# Patient Record
Sex: Male | Born: 1940
Health system: Southern US, Community
[De-identification: ages and names within clinical notes are randomized; demographics above are authoritative.]

## PROBLEM LIST (undated history)

## (undated) DIAGNOSIS — E88819 Insulin resistance, unspecified: Secondary | ICD-10-CM

## (undated) DIAGNOSIS — I2699 Other pulmonary embolism without acute cor pulmonale: Secondary | ICD-10-CM

## (undated) DIAGNOSIS — I519 Heart disease, unspecified: Secondary | ICD-10-CM

## (undated) DIAGNOSIS — M958 Other specified acquired deformities of musculoskeletal system: Secondary | ICD-10-CM

## (undated) DIAGNOSIS — M1990 Unspecified osteoarthritis, unspecified site: Secondary | ICD-10-CM

## (undated) DIAGNOSIS — E8881 Metabolic syndrome: Secondary | ICD-10-CM

## (undated) DIAGNOSIS — I240 Acute coronary thrombosis not resulting in myocardial infarction: Secondary | ICD-10-CM

## (undated) DIAGNOSIS — Z974 Presence of external hearing-aid: Secondary | ICD-10-CM

## (undated) HISTORY — DX: Unspecified osteoarthritis, unspecified site: M19.90

## (undated) HISTORY — DX: Other pulmonary embolism without acute cor pulmonale: I26.99

## (undated) HISTORY — DX: Heart disease, unspecified: I51.9

## (undated) HISTORY — DX: Insulin resistance, unspecified: E88.819

## (undated) HISTORY — DX: Metabolic syndrome: E88.81

## (undated) HISTORY — DX: Other specified acquired deformities of musculoskeletal system: M95.8

## (undated) HISTORY — DX: Acute coronary thrombosis not resulting in myocardial infarction: I24.0

## (undated) HISTORY — PX: OTHER SURGICAL HISTORY: SHX169

## (undated) HISTORY — PX: CARDIAC CATHETERIZATION: SHX172

## (undated) HISTORY — PX: TONSILLECTOMY: SUR1361

## (undated) HISTORY — DX: Presence of external hearing-aid: Z97.4

## (undated) HISTORY — PX: APPENDECTOMY: SHX54

---

## 2001-09-29 ENCOUNTER — Encounter: Admission: RE | Admit: 2001-09-29 | Discharge: 2001-11-14 | Payer: Self-pay | Admitting: Internal Medicine

## 2001-10-10 ENCOUNTER — Ambulatory Visit (HOSPITAL_COMMUNITY): Admission: RE | Admit: 2001-10-10 | Discharge: 2001-10-10 | Payer: Self-pay | Admitting: Internal Medicine

## 2001-10-10 ENCOUNTER — Encounter: Payer: Self-pay | Admitting: Internal Medicine

## 2002-11-15 ENCOUNTER — Encounter: Admission: RE | Admit: 2002-11-15 | Discharge: 2003-02-13 | Payer: Self-pay | Admitting: Internal Medicine

## 2005-03-30 ENCOUNTER — Ambulatory Visit: Payer: Self-pay | Admitting: Internal Medicine

## 2006-04-20 ENCOUNTER — Ambulatory Visit: Payer: Self-pay | Admitting: Family Medicine

## 2006-04-21 ENCOUNTER — Encounter: Payer: Self-pay | Admitting: Internal Medicine

## 2006-04-28 ENCOUNTER — Ambulatory Visit: Payer: Self-pay | Admitting: Family Medicine

## 2006-11-09 ENCOUNTER — Ambulatory Visit: Payer: Self-pay | Admitting: Family Medicine

## 2006-11-09 DIAGNOSIS — E039 Hypothyroidism, unspecified: Secondary | ICD-10-CM | POA: Insufficient documentation

## 2006-11-23 ENCOUNTER — Encounter: Payer: Self-pay | Admitting: Family Medicine

## 2006-11-24 LAB — CONVERTED CEMR LAB
Direct LDL: 168 mg/dL — ABNORMAL HIGH
Glucose, Bld: 80 mg/dL (ref 70–99)
TSH: 1.089 microintl units/mL (ref 0.350–5.50)

## 2006-11-25 ENCOUNTER — Telehealth (INDEPENDENT_AMBULATORY_CARE_PROVIDER_SITE_OTHER): Payer: Self-pay | Admitting: *Deleted

## 2006-12-06 ENCOUNTER — Telehealth (INDEPENDENT_AMBULATORY_CARE_PROVIDER_SITE_OTHER): Payer: Self-pay | Admitting: *Deleted

## 2006-12-20 ENCOUNTER — Ambulatory Visit: Payer: Self-pay | Admitting: Family Medicine

## 2006-12-20 DIAGNOSIS — E7800 Pure hypercholesterolemia, unspecified: Secondary | ICD-10-CM | POA: Insufficient documentation

## 2007-01-24 ENCOUNTER — Encounter: Payer: Self-pay | Admitting: Family Medicine

## 2007-03-14 ENCOUNTER — Ambulatory Visit: Payer: Self-pay | Admitting: Family Medicine

## 2007-03-14 DIAGNOSIS — R030 Elevated blood-pressure reading, without diagnosis of hypertension: Secondary | ICD-10-CM | POA: Insufficient documentation

## 2007-04-07 ENCOUNTER — Encounter: Payer: Self-pay | Admitting: Family Medicine

## 2007-04-08 ENCOUNTER — Encounter: Payer: Self-pay | Admitting: Family Medicine

## 2007-04-08 LAB — CONVERTED CEMR LAB
ALT: 20 units/L (ref 0–53)
AST: 23 units/L (ref 0–37)
Albumin: 4 g/dL (ref 3.5–5.2)
Alkaline Phosphatase: 54 units/L (ref 39–117)
Bilirubin, Direct: 0.1 mg/dL (ref 0.0–0.3)
Cholesterol: 138 mg/dL (ref 0–200)
HDL: 44 mg/dL (ref >39)
Indirect Bilirubin: 0.4 mg/dL (ref 0.0–0.9)
LDL Cholesterol: 81 mg/dL (ref 0–99)
Total Bilirubin: 0.5 mg/dL (ref 0.3–1.2)
Total CHOL/HDL Ratio: 3.1
Total Protein: 6.2 g/dL (ref 6.0–8.3)
Triglycerides: 67 mg/dL (ref <150)
VLDL: 13 mg/dL (ref 0–40)

## 2007-06-02 ENCOUNTER — Encounter: Payer: Self-pay | Admitting: Family Medicine

## 2007-07-13 ENCOUNTER — Encounter: Payer: Self-pay | Admitting: Family Medicine

## 2007-07-14 ENCOUNTER — Encounter: Payer: Self-pay | Admitting: Family Medicine

## 2007-07-14 LAB — CONVERTED CEMR LAB
ALT: 20 units/L (ref 0–53)
AST: 22 units/L (ref 0–37)
Albumin: 4.4 g/dL (ref 3.5–5.2)
Alkaline Phosphatase: 56 units/L (ref 39–117)
Bilirubin, Direct: 0.1 mg/dL (ref 0.0–0.3)
Cholesterol, target level: 200 mg/dL
Cholesterol: 167 mg/dL (ref 0–200)
HDL goal, serum: 40 mg/dL
HDL: 43 mg/dL (ref >39)
Indirect Bilirubin: 0.5 mg/dL (ref 0.0–0.9)
LDL Cholesterol: 99 mg/dL (ref 0–99)
LDL Goal: 160 mg/dL
Total Bilirubin: 0.6 mg/dL (ref 0.3–1.2)
Total CHOL/HDL Ratio: 3.9
Total Protein: 6.6 g/dL (ref 6.0–8.3)
Triglycerides: 124 mg/dL (ref <150)
VLDL: 25 mg/dL (ref 0–40)

## 2007-09-29 ENCOUNTER — Ambulatory Visit: Payer: Self-pay | Admitting: Family Medicine

## 2007-09-29 DIAGNOSIS — R109 Unspecified abdominal pain: Secondary | ICD-10-CM | POA: Insufficient documentation

## 2007-09-30 ENCOUNTER — Telehealth: Payer: Self-pay | Admitting: Family Medicine

## 2007-12-01 ENCOUNTER — Ambulatory Visit: Payer: Self-pay | Admitting: Family Medicine

## 2007-12-01 ENCOUNTER — Encounter: Payer: Self-pay | Admitting: Family Medicine

## 2007-12-01 LAB — CONVERTED CEMR LAB
ALT: 23 units/L (ref 0–53)
AST: 21 units/L (ref 0–37)
Albumin: 4.6 g/dL (ref 3.5–5.2)
Alkaline Phosphatase: 52 units/L (ref 39–117)
BUN: 16 mg/dL (ref 6–23)
CO2: 23 meq/L (ref 19–32)
Calcium: 9.4 mg/dL (ref 8.4–10.5)
Chloride: 105 meq/L (ref 96–112)
Creatinine, Ser: 0.96 mg/dL (ref 0.40–1.50)
Glucose, Bld: 117 mg/dL — ABNORMAL HIGH (ref 70–99)
HCT: 42.5 % (ref 39.0–52.0)
Hemoglobin: 14.2 g/dL (ref 13.0–17.0)
MCHC: 33.4 g/dL (ref 30.0–36.0)
MCV: 89.5 fL (ref 78.0–100.0)
Platelets: 248 10*3/uL (ref 150–400)
Potassium: 4.6 meq/L (ref 3.5–5.3)
RBC: 4.75 M/uL (ref 4.22–5.81)
RDW: 13.7 % (ref 11.5–15.5)
Sodium: 140 meq/L (ref 135–145)
TSH: 0.732 microintl units/mL (ref 0.350–5.50)
Total Bilirubin: 0.7 mg/dL (ref 0.3–1.2)
Total Protein: 7 g/dL (ref 6.0–8.3)
WBC: 6 10*3/uL (ref 4.0–10.5)

## 2007-12-02 ENCOUNTER — Telehealth (INDEPENDENT_AMBULATORY_CARE_PROVIDER_SITE_OTHER): Payer: Self-pay | Admitting: *Deleted

## 2007-12-27 ENCOUNTER — Encounter: Payer: Self-pay | Admitting: Family Medicine

## 2008-06-15 ENCOUNTER — Encounter: Payer: Self-pay | Admitting: Family Medicine

## 2008-07-20 ENCOUNTER — Encounter: Payer: Self-pay | Admitting: Family Medicine

## 2008-09-17 ENCOUNTER — Ambulatory Visit: Payer: Self-pay | Admitting: Family Medicine

## 2008-10-12 ENCOUNTER — Encounter: Payer: Self-pay | Admitting: Family Medicine

## 2008-10-15 LAB — CONVERTED CEMR LAB
ALT: 20 units/L (ref 0–53)
AST: 22 units/L (ref 0–37)
Albumin: 4.3 g/dL (ref 3.5–5.2)
Alkaline Phosphatase: 46 units/L (ref 39–117)
BUN: 24 mg/dL — ABNORMAL HIGH (ref 6–23)
CO2: 22 meq/L (ref 19–32)
Calcium: 8.8 mg/dL (ref 8.4–10.5)
Chloride: 107 meq/L (ref 96–112)
Cholesterol: 164 mg/dL (ref 0–200)
Creatinine, Ser: 0.83 mg/dL (ref 0.40–1.50)
Glucose, Bld: 117 mg/dL — ABNORMAL HIGH (ref 70–99)
HDL: 41 mg/dL (ref >39)
LDL Cholesterol: 99 mg/dL (ref 0–99)
PSA: 0.66 ng/mL (ref 0.10–4.00)
Potassium: 4.5 meq/L (ref 3.5–5.3)
Sodium: 141 meq/L (ref 135–145)
TSH: 1.871 microintl units/mL (ref 0.350–4.50)
Total Bilirubin: 0.7 mg/dL (ref 0.3–1.2)
Total CHOL/HDL Ratio: 4
Total Protein: 6.4 g/dL (ref 6.0–8.3)
Triglycerides: 120 mg/dL (ref <150)
VLDL: 24 mg/dL (ref 0–40)

## 2009-01-14 ENCOUNTER — Telehealth (INDEPENDENT_AMBULATORY_CARE_PROVIDER_SITE_OTHER): Payer: Self-pay | Admitting: *Deleted

## 2009-01-24 ENCOUNTER — Encounter: Payer: Self-pay | Admitting: Family Medicine

## 2009-06-20 ENCOUNTER — Ambulatory Visit: Payer: Self-pay | Admitting: Family Medicine

## 2009-07-02 ENCOUNTER — Encounter: Payer: Self-pay | Admitting: Family Medicine

## 2009-10-24 ENCOUNTER — Ambulatory Visit: Payer: Self-pay | Admitting: Family Medicine

## 2009-11-01 ENCOUNTER — Encounter: Payer: Self-pay | Admitting: Family Medicine

## 2009-11-04 ENCOUNTER — Telehealth: Payer: Self-pay | Admitting: Family Medicine

## 2009-11-04 LAB — CONVERTED CEMR LAB
ALT: 20 units/L (ref 0–53)
AST: 19 units/L (ref 0–37)
Albumin: 4.2 g/dL (ref 3.5–5.2)
Alkaline Phosphatase: 52 units/L (ref 39–117)
BUN: 18 mg/dL (ref 6–23)
CO2: 19 meq/L (ref 19–32)
Calcium: 8.9 mg/dL (ref 8.4–10.5)
Chloride: 109 meq/L (ref 96–112)
Cholesterol: 154 mg/dL (ref 0–200)
Creatinine, Ser: 0.87 mg/dL (ref 0.40–1.50)
Glucose, Bld: 108 mg/dL — ABNORMAL HIGH (ref 70–99)
HDL: 37 mg/dL — ABNORMAL LOW (ref >39)
LDL Cholesterol: 90 mg/dL (ref 0–99)
Potassium: 4.1 meq/L (ref 3.5–5.3)
Sodium: 142 meq/L (ref 135–145)
TSH: 1.836 microintl units/mL (ref 0.350–4.500)
Total Bilirubin: 0.7 mg/dL (ref 0.3–1.2)
Total CHOL/HDL Ratio: 4.2
Total Protein: 6.5 g/dL (ref 6.0–8.3)
Triglycerides: 135 mg/dL (ref <150)
VLDL: 27 mg/dL (ref 0–40)

## 2009-11-06 ENCOUNTER — Encounter: Payer: Self-pay | Admitting: Family Medicine

## 2009-11-08 ENCOUNTER — Encounter: Payer: Self-pay | Admitting: Family Medicine

## 2010-01-21 ENCOUNTER — Encounter: Payer: Self-pay | Admitting: Family Medicine

## 2010-07-23 ENCOUNTER — Ambulatory Visit: Payer: Self-pay | Admitting: Family Medicine

## 2010-07-23 DIAGNOSIS — R000 Tachycardia, unspecified: Secondary | ICD-10-CM | POA: Insufficient documentation

## 2010-07-25 LAB — CONVERTED CEMR LAB
ALT: 19 units/L (ref 0–53)
AST: 21 units/L (ref 0–37)
Albumin: 4.6 g/dL (ref 3.5–5.2)
Alkaline Phosphatase: 51 units/L (ref 39–117)
BUN: 17 mg/dL (ref 6–23)
CO2: 25 meq/L (ref 19–32)
Calcium: 9.1 mg/dL (ref 8.4–10.5)
Chloride: 105 meq/L (ref 96–112)
Cholesterol: 162 mg/dL (ref 0–200)
Creatinine, Ser: 0.88 mg/dL (ref 0.40–1.50)
Glucose, Bld: 119 mg/dL — ABNORMAL HIGH (ref 70–99)
HDL: 41 mg/dL (ref >39)
LDL Cholesterol: 92 mg/dL (ref 0–99)
PSA: 0.58 ng/mL (ref 0.10–4.00)
Potassium: 4.5 meq/L (ref 3.5–5.3)
Sodium: 139 meq/L (ref 135–145)
TSH: 1.437 microintl units/mL (ref 0.350–4.500)
Total Bilirubin: 0.6 mg/dL (ref 0.3–1.2)
Total CHOL/HDL Ratio: 4
Total Protein: 6.5 g/dL (ref 6.0–8.3)
Triglycerides: 144 mg/dL (ref <150)
VLDL: 29 mg/dL (ref 0–40)

## 2010-10-02 ENCOUNTER — Ambulatory Visit: Payer: Self-pay | Admitting: Family Medicine

## 2010-12-21 HISTORY — PX: ACNE CYST REMOVAL: SUR1112

## 2011-01-20 NOTE — Assessment & Plan Note (Signed)
Summary: Seb cyst   Vital Signs:  Patient profile:   70 year old male Height:      70 inches Weight:      210 pounds Pulse rate:   80 / minute BP sitting:   145 / 85  (right arm) Cuff size:   regular  Vitals Entered By: Avon Gully CMA, Duncan Dull) (October 02, 2010 1:16 PM) CC: knot on rt shoulder,wife notice last week   Primary Care Provider:  Nani Gasser MD  CC:  knot on rt shoulder and wife notice last week.  History of Present Illness: knot on rt shoulder,wife noticed it last week. NOt bothering him, nontender.  No drianage.   Current Medications (verified): 1)  Synthroid 75 Mcg Tabs (Levothyroxine Sodium) .... Take One By Mouth Daily 2)  Meloxicam 15 Mg Tabs (Meloxicam) .... Take 1 Tablet By Mouth Once A Day As Needed Pain. 3)  Multivitamins  Tabs (Multiple Vitamin) .... Take 1 Tablet By Mouth Once A Day 4)  Prilosec Otc 20 Mg Tbec (Omeprazole Magnesium) .... Take 1 Tablet By Mouth Once A Day 5)  Zocor 40 Mg Tabs (Simvastatin) .... Take 1 Tablet By Mouth Once A Day 6)  Toprol Xl 100 Mg Tb24 (Metoprolol Succinate) .... Take One Tablet By Mouth Once A Day  Allergies (verified): No Known Drug Allergies  Comments:  Nurse/Medical Assistant: The patient's medications and allergies were reviewed with the patient and were updated in the Medication and Allergy Lists. Avon Gully CMA, Duncan Dull) (October 02, 2010 1:17 PM)  Physical Exam  General:  Well-developed,well-nourished,in no acute distress; alert,appropriate and cooperative throughout examination Skin:  lRight uppper back over scapula has a appor 1 cm sebaceious cyst. doesn't appear inflammed.    Impression & Recommendations:  Problem # 1:  SEBACEOUS CYST (ICD-706.2) Gave reassurance. Deicused techniques for removal if he would like it removed but recommend just keep an eye on it.    Complete Medication List: 1)  Synthroid 75 Mcg Tabs (Levothyroxine sodium) .... Take one by mouth daily 2)  Meloxicam 15  Mg Tabs (Meloxicam) .... Take 1 tablet by mouth once a day as needed pain. 3)  Multivitamins Tabs (Multiple vitamin) .... Take 1 tablet by mouth once a day 4)  Prilosec Otc 20 Mg Tbec (Omeprazole magnesium) .... Take 1 tablet by mouth once a day 5)  Zocor 40 Mg Tabs (Simvastatin) .... Take 1 tablet by mouth once a day 6)  Toprol Xl 100 Mg Tb24 (Metoprolol succinate) .... Take one tablet by mouth once a day 7)  Hydrocortisone-acetic Acid 1-2 % Soln (Hydrocortisone-acetic acid) .... 3 gtt in each ear eery 8 hours for 5-7 days.  Patient Instructions: 1)  Sebaceous cyst on your back. Keep an eye on it to see if getting larger.  Prescriptions: HYDROCORTISONE-ACETIC ACID 1-2 % SOLN (HYDROCORTISONE-ACETIC ACID) 3 gtt in each ear eery 8 hours for 5-7 days.  #1 bottle. x 1   Entered and Authorized by:   Nani Gasser MD   Signed by:   Nani Gasser MD on 10/02/2010   Method used:   [facility]ically to        [facility] [phone][facility] (retail)       [address] [place], [place]  [address]       Ph: [phone] or [phone]       Fax: [phone]   RxID:   [id number]

## 2011-01-20 NOTE — Assessment & Plan Note (Signed)
Summary: F/u  chol, thyroid, etc   Vital Signs:  Patient profile:   70 year old male Height:      70 inches Weight:      209 pounds BMI:     30.10 Pulse rate:   65 / minute BP sitting:   128 / 75  (left arm) Cuff size:   regular  Vitals Entered By: Avon Gully CMA, Duncan Dull) (July 23, 2010 8:39 AM) CC: F/u meds   Primary Care Provider:  Nani Gasser MD  CC:  F/u meds.  History of Present Illness: Has been having occ rapid HR. Not sure if anxiety realted. Trying to work on his stress levels. Had a cath in 2007. [facility]   Has been trying to wean down his meloxicm becuase awnsto to get off of it.Tolerating it well. Still having alot of back pain. Holding off on seeing ortho for now because afraid they may say he needs surgery.    Denies any thyroid symptoms. No skin or hair changes.   Current Medications (verified): 1)  Synthroid 75 Mcg Tabs (Levothyroxine Sodium) .... Take One By Mouth Daily 2)  Meloxicam 15 Mg Tabs (Meloxicam) .... Take 1 Tablet By Mouth Once A Day As Needed Pain. 3)  Multivitamins  Tabs (Multiple Vitamin) .... Take 1 Tablet By Mouth Once A Day 4)  Prilosec Otc 20 Mg Tbec (Omeprazole Magnesium) .... Take 1 Tablet By Mouth Once A Day 5)  Zocor 40 Mg Tabs (Simvastatin) .... Take 1 Tablet By Mouth Once A Day 6)  Toprol Xl 100 Mg Tb24 (Metoprolol Succinate) .... Take One Tablet By Mouth Once A Day  Allergies (verified): No Known Drug Allergies  Comments:  Nurse/Medical Assistant: The patient's medications and allergies were reviewed with the patient and were updated in the Medication and Allergy Lists. 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 Lungs:  Normal respiratory effort, chest expands symmetrically. Lungs are clear to auscultation, no crackles or wheezes. Heart:  Normal rate and regular rhythm. S1 and S2 normal without gallop, murmur, click, rub or other extra  No carotid bruits.  Skin:  no rashes.   Cervical Nodes:  No lymphadenopathy noted Psych:  Cognition and judgment appear intact. Alert and cooperative with normal attention span and concentration. No apparent delusions, illusions, hallucinations   Impression & Recommendations:  Problem # 1:  HYPERCHOLESTEROLEMIA (ICD-272.0) Doing well. No SE.  will recheck today.   His updated medication list for this problem includes:    Zocor 40 Mg Tabs (Simvastatin) .[name]... Take 1 tablet by mouth once a day  Orders: T-Comprehensive Metabolic Panel ([id number]) T-Lipid Profile ([id number])  Problem # 2:  HYPOTHYROIDISM NOS (ICD-244.9) Will recheck the level.  His updated medication list for this problem includes:    Synthroid 75 Mcg Tabs (Levothyroxine sodium) .[name]... Take one by mouth daily  Orders: T-TSH ([id number])  Problem # 3:  TACHYCARDIA (ICD-785.0) Do  rec he f/u with his cardiologist for a cardiac monitor. He says he will do this.    Complete Medication List: 1)  Synthroid 75 Mcg Tabs (Levothyroxine sodium) .... Take one by mouth daily 2)  Meloxicam 15 Mg Tabs (Meloxicam) .... Take 1 tablet by mouth once a day as needed pain. 3)  Multivitamins Tabs (Multiple vitamin) .... Take 1 tablet by mouth once a day 4)  Prilosec Otc 20 Mg Tbec (Omeprazole magnesium) .... Take 1 tablet  by mouth once a day 5)  Zocor 40 Mg Tabs (Simvastatin) .... Take 1 tablet by mouth once a day 6)  Toprol Xl 100 Mg Tb24 (Metoprolol succinate) .... Take one tablet by mouth once a day  Other Orders: T-PSA Total (Medicare Screen Only) ([id number])  Patient Instructions: 1)  We will call you with your lab results.  2)  Please schedule a follow-up appointment in 6 months .

## 2011-01-20 NOTE — Medication Information (Signed)
Summary: Possible Nonadherence with Simvastatin/[facility]  Possible Nonadherence with Simvastatin/[facility]   Imported By: Lanelle Bal 01/31/2010 12:04:13  _____________________________________________________________________  External Attachment:    Type:   Image     Comment:   External Document

## 2011-01-28 ENCOUNTER — Telehealth: Payer: Self-pay | Admitting: Family Medicine

## 2011-01-28 ENCOUNTER — Telehealth (INDEPENDENT_AMBULATORY_CARE_PROVIDER_SITE_OTHER): Payer: Self-pay | Admitting: *Deleted

## 2011-01-30 ENCOUNTER — Ambulatory Visit (INDEPENDENT_AMBULATORY_CARE_PROVIDER_SITE_OTHER): Payer: Medicare Other | Admitting: Family Medicine

## 2011-01-30 ENCOUNTER — Encounter: Payer: Self-pay | Admitting: Family Medicine

## 2011-01-30 DIAGNOSIS — R1012 Left upper quadrant pain: Secondary | ICD-10-CM | POA: Insufficient documentation

## 2011-01-30 DIAGNOSIS — R030 Elevated blood-pressure reading, without diagnosis of hypertension: Secondary | ICD-10-CM

## 2011-02-05 ENCOUNTER — Ambulatory Visit
Admission: RE | Admit: 2011-02-05 | Discharge: 2011-02-05 | Disposition: A | Discharge status: Home / Self Care | Payer: Medicare Other | Source: Ambulatory Visit | Attending: Family Medicine | Admitting: Family Medicine

## 2011-02-05 ENCOUNTER — Other Ambulatory Visit: Payer: Self-pay | Admitting: Family Medicine

## 2011-02-05 DIAGNOSIS — R079 Chest pain, unspecified: Secondary | ICD-10-CM

## 2011-02-05 NOTE — Progress Notes (Signed)
Summary: [facility] refill  Phone Note Call from Patient Call back at Home Phone [phone]   Caller: Patient Reason for Call: Refill Medication Summary of Call: pt has appt on 2/10, needs enough toprol to make it to appt.  Does not want full rx as he may ask for med to be switched. LM to [facility] at home number to clarify how many tabs he needs and what pharmacy. Initial call taken by: Francee Piccolo CMA Duncan Dull),  January 28, 2011 10:02 AM    Prescriptions: TOPROL XL 100 MG TB24 (METOPROLOL SUCCINATE) take one tablet by mouth once a day  #7 x 0   Entered and Authorized by:   Nani Gasser MD   Signed by:   Nani Gasser MD on 01/28/2011   Method used:   Electronically to        [facility] [phone]* (retail)       [address] [place], [place]  [address]       Ph: [phone] or [phone]       Fax: [phone]   RxID:   [id number]

## 2011-02-05 NOTE — Assessment & Plan Note (Signed)
Summary: LUQ noises, BP   Vital Signs:  Patient profile:   70 year old male Height:      70 inches Weight:      217 pounds Pulse rate:   61 / minute BP sitting:   148 / 83  (right arm) Cuff size:   regular  Vitals Entered By: Avon Gully CMA, Duncan Dull) (January 30, 2011 4:35 PM) CC: wants dr. to listen to abd,makes loud noise   Primary Care Provider:  Nani Gasser MD  CC:  wants dr. to listen to abd and makes loud noise.  History of Present Illness: wants dr. to listen to abd,makes loud noise.  Has been there for a cuple of years.  Stomach is grumbling. Not painful.  More pevelant.  No changein bloating or gas. Notices less when he is sitting up straight.  Sometimes radiates towards the chest.  Just notices it when inhales. On prilosec for years since on the meloxicam.  Does burp alot. NO reflux sxs.    Current Medications (verified): 1)  Synthroid 75 Mcg Tabs (Levothyroxine Sodium) .... Take One By Mouth Daily 2)  Meloxicam 15 Mg Tabs (Meloxicam) .... Take 1 Tablet By Mouth Once A Day As Needed Pain. 3)  Multivitamins  Tabs (Multiple Vitamin) .... Take 1 Tablet By Mouth Once A Day 4)  Prilosec Otc 20 Mg Tbec (Omeprazole Magnesium) .... Take 1 Tablet By Mouth Once A Day 5)  Zocor 40 Mg Tabs (Simvastatin) .... Take 1 Tablet By Mouth Once A Day 6)  Toprol Xl 100 Mg Tb24 (Metoprolol Succinate) .... Take One Tablet By Mouth Once A Day  Allergies (verified): No Known Drug Allergies  Comments:  Nurse/Medical Assistant: The patient's medications and allergies were reviewed with the patient and were updated in the Medication and Allergy Lists. Avon Gully CMA, Duncan Dull) (January 30, 2011 4:38 PM)  Physical Exam  General:  Well-developed,well-nourished,in no acute distress; alert,appropriate and cooperative throughout examination Lungs:  Normal respiratory effort, chest expands symmetrically. Lungs are clear to auscultation, no crackles or wheezes. Heart:  Normal rate  and regular rhythm. S1 and S2 normal without gallop, murmur, click, rub or other extra sounds. Abdomen:  Bowel sounds positive,abdomen soft and non-tender without masses, organomegaly or hernias noted. He does have loud BS in the LUQ. Nonender.  Skin:  no rashes.   Psych:  Cognition and judgment appear intact. Alert and cooperative with normal attention span and concentration. No apparent delusions, illusions, hallucinations   Impression & Recommendations:  Problem # 1:  LUQ PAIN (ICD-789.02) Discussed likely hiatal hernia. He is not actually having pain but wants reassurance. We will get a CXR to rule out any problems with the lungs and sometimes if has a large hiatal hernia can see it on CXR.  If gets worse or becomes painful the please call the office. Or fi starts have GERD sxs.  Orders: T-Chest x-ray, 2 views (71020)  Problem # 2:  ABNORMAL FINDINGS, ELEVATED BP W/O HTN (ICD-796.2) this is up for him. Was up at lat visit as well. Says getting 116/70s at home on average thought this AM in the 130s. Says did eat coldcutsa for lunch the day before. F/U for recheck in on e week and bring in home monitor. Denies any use of cold meds.  His updated medication list for this problem includes:    Metoprolol Succinate 100 Mg Xr24h-tab (Metoprolol succinate) .[name]... Take 1 tablet by mouth once a day  Complete Medication List: 1)  Synthroid 75 Mcg  Tabs (Levothyroxine sodium) .... Take one by mouth daily 2)  Meloxicam 15 Mg Tabs (Meloxicam) .... Take 1 tablet by mouth once a day as needed pain. 3)  Multivitamins Tabs (Multiple vitamin) .... Take 1 tablet by mouth once a day 4)  Prilosec Otc 20 Mg Tbec (Omeprazole magnesium) .... Take 1 tablet by mouth once a day 5)  Simvastatin 40 Mg Tabs (Simvastatin) .... Take 1 tablet by mouth once a day at bedtime 6)  Metoprolol Succinate 100 Mg Xr24h-tab (Metoprolol succinate) .... Take 1 tablet by mouth once a day  Patient Instructions: 1)  Follow up in 1-2  weeks to recheck your blood pressure. Bring in your home cuff with you.  Prescriptions: METOPROLOL SUCCINATE 100 MG XR24H-TAB (METOPROLOL SUCCINATE) Take 1 tablet by mouth once a day  #90 x 3   Entered and Authorized by:   Nani Gasser MD   Signed by:   Nani Gasser MD on 01/30/2011   Method used:   Printed then faxed to ...       [facility] (retail)             , [place]         Ph: [phone]       Fax: [phone]   RxID:   [id number]    Orders Added: 1)  T-Chest x-ray, 2 views [71020] 2)  Est. Patient Level III [62952]

## 2011-02-05 NOTE — Progress Notes (Signed)
Summary: Refil of a few  Phone Note Refill Request Message from:  Patient on January 28, 2011 1:46 PM  Patient came in today and states he just needs enough Metroprolol to last him until Friday his appt time because he does not want a whole motnh supply because of how much it is and he plans on switching after his appt on Friday 01/30/11 at 4:30... Please call him when this has been completed Cell [phone].Michaelle Copas  January 28, 2011 1:54 PM   Next Appointment Scheduled: 01/30/11 Initial call taken by: Michaelle Copas,  January 28, 2011 1:54 PM

## 2011-02-06 ENCOUNTER — Ambulatory Visit (INDEPENDENT_AMBULATORY_CARE_PROVIDER_SITE_OTHER): Payer: Medicare Other | Admitting: Family Medicine

## 2011-02-06 ENCOUNTER — Encounter: Payer: Self-pay | Admitting: Family Medicine

## 2011-02-06 DIAGNOSIS — J209 Acute bronchitis, unspecified: Secondary | ICD-10-CM

## 2011-02-11 NOTE — Assessment & Plan Note (Addendum)
Summary: Cough & Congestion    Vital Signs:  Patient profile:   70 year old male Height:      70 inches Weight:      216 pounds Temp:     98.4 degrees F oral Pulse rate:   71 / minute BP sitting:   137 / 73  (right arm) Cuff size:   regular  Vitals Entered By: Avon Gully CMA, Duncan Dull) (February 06, 2011 11:49 AM) CC: congestion and cough x 1 week   Primary Care Provider:  Nani Gasser MD  CC:  congestion and cough x 1 week.  History of Present Illness: Cough and congestion mostly in his throat.  Cough is productive.  Sxs for one week.  No fever.  No sinus congestion or draingage.  Mild ST.  No post nasal drip. No SOB.  Has been using robitussin and that helps. NO SOB  or fever or GI sxs.  Feels the cough and phlegm are moslty in the neck area. The cough is forceful.   Current Medications (verified): 1)  Synthroid 75 Mcg Tabs (Levothyroxine Sodium) .... Take One By Mouth Daily 2)  Meloxicam 15 Mg Tabs (Meloxicam) .... Take 1 Tablet By Mouth Once A Day As Needed Pain. 3)  Multivitamins  Tabs (Multiple Vitamin) .... Take 1 Tablet By Mouth Once A Day 4)  Prilosec Otc 20 Mg Tbec (Omeprazole Magnesium) .... Take 1 Tablet By Mouth Once A Day 5)  Simvastatin 40 Mg Tabs (Simvastatin) .... Take 1 Tablet By Mouth Once A Day At Bedtime 6)  Metoprolol Succinate 100 Mg Xr24h-Tab (Metoprolol Succinate) .... Take 1 Tablet By Mouth Once A Day  Allergies (verified): No Known Drug Allergies  Comments:  Nurse/Medical Assistant: The patient's medications and allergies were reviewed with the patient and were updated in the Medication and Allergy Lists. Avon Gully CMA, Duncan Dull) (February 06, 2011 11:49 AM)  Physical Exam  General:  Well-developed,well-nourished,in no acute distress; alert,appropriate and cooperative throughout examination Head:  Normocephalic and atraumatic without obvious abnormalities. No apparent alopecia or balding. Eyes:  No corneal or conjunctival  inflammation noted. EOMI. Perrla Ears:  External ear exam shows no significant lesions or deformities.  Bilat hearing aids.  Nose:  External nasal examination shows no deformity or inflammation. Nasal mucosa are pink and moist without lesions or exudates. Mouth:  Oral mucosa and oropharynx without lesions or exudates.  Teeth in good repair. Neck:  No deformities, masses, or tenderness noted. Lungs:  Normal respiratory effort, chest expands symmetrically. Lungs are clear to auscultation, no crackles or wheezes. Heart:  Normal rate and regular rhythm. S1 and S2 normal without gallop, murmur, click, rub or other extra sounds. Skin:  no rashes.   Cervical Nodes:  No lymphadenopathy noted   Impression & Recommendations:  Problem # 1:  BRONCHITIS- ACUTE (ICD-466.0) and let him know that eating.  I explained to him I have seen several illnesses with the exact same features.  It is typically lasting between 3 to 4 weeks.  If he is not getting better in the next two weeks or he suddenly spikes a fever he should call the office.  Also if he suddenly gets worse he can let us know.  In the meantime if the Robitussin is helping he certainly uses over-the-counter.  If he feels his cough is getting worse we could certainly use a nighttime cough medication and possibly Tessalon Perles during the day.  He declined any prescription medication at this time.of note he had a normal  chest x-ray last week that showed no sign of bronchitis or pneumonia.  Problem # 2:  TACHYCARDIA (ICD-785.0) pulse I had incorrectly said the extended release version and metoprolol.  He had wanted to change to generic b.i.d. testing at his last visit.  [facility]  Complete Medication List: 1)  Synthroid 75 Mcg Tabs (Levothyroxine sodium) .... Take one by mouth daily 2)  Meloxicam 15 Mg Tabs (Meloxicam) .... Take 1 tablet by mouth once a day as needed pain. 3)  Multivitamins Tabs (Multiple vitamin) .... Take  1 tablet by mouth once a day 4)  Prilosec Otc 20 Mg Tbec (Omeprazole magnesium) .... Take 1 tablet by mouth once a day 5)  Simvastatin 40 Mg Tabs (Simvastatin) .... Take 1 tablet by mouth once a day at bedtime 6)  Metoprolol Tartrate 100 Mg Tabs (Metoprolol tartrate) .... Take 1 tablet by mouth two times a day  Patient Instructions: 1)  Call if cough is not better in 2 weeks or if suddenly develop fever or feel you are getting worse.  Prescriptions: METOPROLOL TARTRATE 100 MG TABS (METOPROLOL TARTRATE) Take 1 tablet by mouth two times a day  #14 x 0   Entered and Authorized by:   Nani Gasser MD   Signed by:   Nani Gasser MD on 02/06/2011   Method used:   [facility] to        [facility] [phone]* (retail)       [address] [place], [place]  [address]       Ph: [phone] or [phone]       Fax: [phone]   RxID:   [id number] METOPROLOL SUCCINATE 100 MG XR24H-TAB (METOPROLOL SUCCINATE) Take 1 tablet by mouth once a day  #7 x 0   Entered and Authorized by:   Nani Gasser MD   Signed by:   Nani Gasser MD on 02/06/2011   Method used:   Electronically to        [facility] [phone]* (retail)       [address] [place], [place]  [address]       Ph: [phone] or [phone]       Fax: [phone]   RxID:   [id number]    Orders Added: 1)  Est. 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

## 2011-02-16 ENCOUNTER — Encounter: Payer: Self-pay | Admitting: Family Medicine

## 2011-02-20 ENCOUNTER — Encounter: Payer: Self-pay | Admitting: Family Medicine

## 2011-05-13 ENCOUNTER — Other Ambulatory Visit: Payer: Self-pay | Admitting: Family Medicine

## 2011-05-13 NOTE — Telephone Encounter (Signed)
OK ot refill for one 90 days supply which will get him to august and then needs a f/u appt.

## 2011-05-13 NOTE — Telephone Encounter (Signed)
[facility] is requesting 90 day supply for patient of the meloxicam and levothyroxine; however, pt last lab 11-04-09 and TSH normal @ 1.836.  Last OV for thyroid is 06-16-10.  Last refills for both these medications were 06-16-10.  Should pt return for labs and office visit??  Please advise. Plan:  Routed to Dr. Marlyne Beards, LPN Domingo Dimes

## 2011-05-19 ENCOUNTER — Telehealth: Payer: Self-pay | Admitting: Family Medicine

## 2011-05-20 ENCOUNTER — Telehealth: Payer: Self-pay | Admitting: Family Medicine

## 2011-05-20 DIAGNOSIS — R52 Pain, unspecified: Secondary | ICD-10-CM

## 2011-05-20 DIAGNOSIS — E039 Hypothyroidism, unspecified: Secondary | ICD-10-CM

## 2011-05-20 MED ORDER — LEVOTHYROXINE SODIUM 75 MCG PO TABS: 75.0000 ug | ORAL_TABLET | Freq: Every day | ORAL | Status: DC

## 2011-05-20 MED ORDER — MELOXICAM 15 MG PO TABS: 15.0000 mg | ORAL_TABLET | Freq: Every day | ORAL | Status: DC | PRN

## 2011-05-20 NOTE — Telephone Encounter (Signed)
Pt informed that  90 day prescriptions for the Meloxicam and levothyroxine were sent to [facility] mail order for him.  Told pt to make sure he follows up in August 2012.  Pt voiced understanding. Jarvis Newcomer, LPN Domingo Dimes

## 2011-05-21 NOTE — Telephone Encounter (Signed)
Don't know why I have this encounter, therefore will make this note then close. Jarvis Newcomer, LPN Domingo Dimes

## 2011-05-21 NOTE — Telephone Encounter (Signed)
error 

## 2011-08-28 ENCOUNTER — Ambulatory Visit (INDEPENDENT_AMBULATORY_CARE_PROVIDER_SITE_OTHER): Payer: Medicare Other | Admitting: Family Medicine

## 2011-08-28 ENCOUNTER — Encounter: Payer: Self-pay | Admitting: Family Medicine

## 2011-08-28 DIAGNOSIS — R52 Pain, unspecified: Secondary | ICD-10-CM

## 2011-08-28 DIAGNOSIS — Z125 Encounter for screening for malignant neoplasm of prostate: Secondary | ICD-10-CM

## 2011-08-28 DIAGNOSIS — Z13220 Encounter for screening for lipoid disorders: Secondary | ICD-10-CM

## 2011-08-28 DIAGNOSIS — E039 Hypothyroidism, unspecified: Secondary | ICD-10-CM

## 2011-08-28 DIAGNOSIS — R000 Tachycardia, unspecified: Secondary | ICD-10-CM

## 2011-08-28 DIAGNOSIS — N400 Enlarged prostate without lower urinary tract symptoms: Secondary | ICD-10-CM

## 2011-08-28 DIAGNOSIS — H9190 Unspecified hearing loss, unspecified ear: Secondary | ICD-10-CM

## 2011-08-28 MED ORDER — LEVOTHYROXINE SODIUM 75 MCG PO TABS: 75.0000 ug | ORAL_TABLET | Freq: Every day | ORAL | Status: DC

## 2011-08-28 MED ORDER — AMBULATORY NON FORMULARY MEDICATION: Status: DC

## 2011-08-28 MED ORDER — MELOXICAM 15 MG PO TABS: 15.0000 mg | ORAL_TABLET | Freq: Every day | ORAL | Status: DC | PRN

## 2011-08-28 NOTE — Progress Notes (Signed)
  Subjective:    Patient ID: [name], male    DOB: Mar 06, 1941, 70 y.o.   MRN: [id number]  HPI  Here to follow up his thyroid and get yearly labs. No complaints. Doing well. No skin or hair changes. No weight changes.   Due for yearly labs.    He would like a referral to a new cardiologist. Prefers [facility]. His current cards has retired.    Would like PSA checked.   Review of Systems     Objective:   Physical Exam  Constitutional: He is oriented to person, place, and time. He appears well-developed and well-nourished.  HENT:  Head: Normocephalic and atraumatic.  Cardiovascular: Normal rate, regular rhythm and normal heart sounds.   Pulmonary/Chest: Effort normal. He has wheezes.  Neurological: He is alert and oriented to person, place, and time.  Skin: Skin is warm and dry.  Psychiatric: He has a normal mood and affect.          Assessment & Plan:  Hypothyroid- doing well. Will check level Due for cholesterol screening Gave a rx for the shingles vaccine He declined the flu vaccine.

## 2011-08-30 ENCOUNTER — Encounter: Payer: Self-pay | Admitting: Family Medicine

## 2011-09-02 LAB — COMPLETE METABOLIC PANEL WITH GFR
AST: 21 U/L (ref 0–37)
Alkaline Phosphatase: 52 U/L (ref 39–117)
BUN: 19 mg/dL (ref 6–23)
Creat: 0.89 mg/dL (ref 0.50–1.35)
GFR, Est Non African American: >60 mL/min (ref >60)
Glucose, Bld: 103 mg/dL — ABNORMAL HIGH (ref 70–99)
Total Bilirubin: 0.7 mg/dL (ref 0.3–1.2)

## 2011-09-02 LAB — LIPID PANEL
Cholesterol: 212 mg/dL — ABNORMAL HIGH (ref 0–200)
HDL: 39 mg/dL — ABNORMAL LOW (ref >39)
Total CHOL/HDL Ratio: 5.4 Ratio
VLDL: 21 mg/dL (ref 0–40)

## 2011-09-02 LAB — TSH: TSH: 1.227 u[IU]/mL (ref 0.350–4.500)

## 2011-09-02 LAB — PSA: PSA: 0.53 ng/mL (ref <=4.00)

## 2011-09-07 ENCOUNTER — Telehealth: Payer: Self-pay | Admitting: *Deleted

## 2011-09-07 NOTE — Telephone Encounter (Signed)
OK will recheck in 6 mo then.

## 2011-09-07 NOTE — Telephone Encounter (Signed)
Pt notified. Takes simvastatin regurlarly. Would like to try diet and exercise before increasing med dose.

## 2011-09-07 NOTE — Telephone Encounter (Signed)
LMOM for pt to return call for results. KJ LPN 

## 2011-09-07 NOTE — Telephone Encounter (Signed)
Message copied by Lanae Crumbly on Mon Sep 07, 2011  8:49 AM ------      Message from: Nani Gasser D      Created: Fri Sep 04, 2011  7:56 AM       Call patient: Thyroid is normal. LDL cholesterol is 152 which is high. Normal is under 100. His HDL is low at 39. If he taking his simvastatin regularly? If not he needs to restart it. If he is on it on not sure why the sudden jump in his cholesterol and we may need to adjust the medication. Blood sugar is still borderline like it has been in the past but does actually look a little bit better this time. Liver, kidney, prostate are all normal.

## 2011-09-07 NOTE — Telephone Encounter (Signed)
Message copied by Lanae Crumbly on Mon Sep 07, 2011  3:02 PM ------      Message from: Nani Gasser D      Created: Fri Sep 04, 2011  7:56 AM       Call patient: Thyroid is normal. LDL cholesterol is 152 which is high. Normal is under 100. His HDL is low at 39. If he taking his simvastatin regularly? If not he needs to restart it. If he is on it on not sure why the sudden jump in his cholesterol and we may need to adjust the medication. Blood sugar is still borderline like it has been in the past but does actually look a little bit better this time. Liver, kidney, prostate are all normal.

## 2011-09-07 NOTE — Telephone Encounter (Signed)
Pt calls back and states he has been taking the Simvastatin regularly but did admit his diet and exercise has not been good due to work schedule. What he would like to do is stay at the current dose of simvastatin and really work on diet and exercise for 6 months and then if rechecks and still up then can increase med if this is ok with you.

## 2011-10-27 ENCOUNTER — Encounter: Payer: Self-pay | Admitting: Family Medicine

## 2011-10-27 ENCOUNTER — Ambulatory Visit (INDEPENDENT_AMBULATORY_CARE_PROVIDER_SITE_OTHER): Payer: Medicare Other | Admitting: Family Medicine

## 2011-10-27 VITALS — BP 121/66 | HR 60 | Temp 97.7°F | Ht 71.0 in | Wt 209.0 lb

## 2011-10-27 DIAGNOSIS — J0100 Acute maxillary sinusitis, unspecified: Secondary | ICD-10-CM

## 2011-10-27 DIAGNOSIS — H60399 Other infective otitis externa, unspecified ear: Secondary | ICD-10-CM

## 2011-10-27 DIAGNOSIS — H6693 Otitis media, unspecified, bilateral: Secondary | ICD-10-CM

## 2011-10-27 DIAGNOSIS — H60509 Unspecified acute noninfective otitis externa, unspecified ear: Secondary | ICD-10-CM

## 2011-10-27 DIAGNOSIS — H6690 Otitis media, unspecified, unspecified ear: Secondary | ICD-10-CM

## 2011-10-27 MED ORDER — FEXOFENADINE-PSEUDOEPHED ER 180-240 MG PO TB24: 1.0000 | ORAL_TABLET | Freq: Every day | ORAL | Status: AC

## 2011-10-27 MED ORDER — NEOMYCIN-POLYMYXIN-HC 3.5-10000-1 OT SOLN: 3.0000 [drp] | Freq: Three times a day (TID) | OTIC | Status: AC

## 2011-10-27 MED ORDER — AMOXICILLIN-POT CLAVULANATE 875-125 MG PO TABS: 1.0000 | ORAL_TABLET | Freq: Two times a day (BID) | ORAL | Status: AC

## 2011-10-27 NOTE — Progress Notes (Signed)
  Subjective:    Patient ID: [name], male    DOB: [birth year], 70 y.o.   MRN: [id number]  Ear Drainage  There is pain in both ears. This is a chronic problem. The current episode started more than 1 month ago. The problem occurs constantly. The problem has been gradually worsening. There has been no fever. The pain is mild. Associated symptoms include ear discharge. Pertinent negatives include no headaches or hearing loss. Associated symptoms comments: Odor associated with it. He has tried nothing for the symptoms. His past medical history is significant for a chronic ear infection and hearing loss.      Review of Systems  HENT: Positive for ear discharge. Negative for hearing loss.   Neurological: Negative for headaches.  All other systems reviewed and are negative.      BP 121/66  Pulse 60  Temp(Src) 97.7 F (36.5 C) (Oral)  Ht 5\' 11"  (1.803 m)  Wt 209 lb (94.802 kg)  BMI 29.15 kg/m2  SpO2 98%  Objective:   Physical Exam  Constitutional: He appears well-developed and well-nourished.  HENT:  Head: Normocephalic and atraumatic.  Right Ear: No lacerations. There is drainage, swelling and tenderness. No foreign bodies. Tympanic membrane is injected and bulging. Decreased hearing is noted.  Left Ear: There is drainage and swelling. No foreign bodies. Tympanic membrane is injected and bulging. Decreased hearing is noted.  Nose: Mucosal edema present.  Mouth/Throat: Uvula is midline.       P(ost nasal drainage seen          Assessment & Plan:  Bilateral OM w/possible perforation and drainage Sinusitis Augmentin and cortisporin ear drops and allegra D

## 2011-10-27 NOTE — Patient Instructions (Signed)
Sinusitis Sinuses are air pockets within the bones of your face. The growth of bacteria within a sinus leads to infection. The infection prevents the sinuses from draining. This infection is called sinusitis. SYMPTOMS  There will be different areas of pain depending on which sinuses have become infected.  The maxillary sinuses often produce pain beneath the eyes.   Frontal sinusitis may cause pain in the middle of the forehead and above the eyes.  Other problems (symptoms) include:  Toothaches.   Colored, pus-like (purulent) drainage from the nose.   Swelling, warmth, and tenderness over the sinus areas may be signs of infection.  TREATMENT  Sinusitis is most often determined by an exam.X-rays may be taken. If x-rays have been taken, make sure you obtain your results or find out how you are to obtain them. Your caregiver may give you medications (antibiotics). These are medications that will help kill the bacteria causing the infection. You may also be given a medication (decongestant) that helps to reduce sinus swelling.  HOME CARE INSTRUCTIONS   Only take over-the-counter or prescription medicines for pain, discomfort, or fever as directed by your caregiver.   Drink extra fluids. Fluids help thin the mucus so your sinuses can drain more easily.   Applying either moist heat or ice packs to the sinus areas may help relieve discomfort.   Use saline nasal sprays to help moisten your sinuses. The sprays can be found at your local drugstore.  SEEK IMMEDIATE MEDICAL CARE IF:  You have a fever.   You have increasing pain, severe headaches, or toothache.   You have nausea, vomiting, or drowsiness.   You develop unusual swelling around the face or trouble seeing.  MAKE SURE YOU:   Understand these instructions.   Will watch your condition.   Will get help right away if you are not doing well or get worse.  Document Released: 12/07/2005 Document Revised: 08/19/2011 Document Reviewed:  07/06/2007 [facility] Patient Information 2012 [place], [place].Otitis Media You or your child has otitis media. This is an infection of the middle chamber of the ear. This condition is common in young children and often follows upper respiratory infections. Symptoms of otitis media may include earache or ear fullness, hearing loss, or fever. If the eardrum ruptures, a middle ear infection may also cause bloody or pus-like discharge from the ear. Fussiness, irritability, and persistent crying may be the only signs of otitis media in small children. Otitis media can be caused by a bacteria or a virus. Antibiotics may be used to treat bacterial otitis media. But antibiotics are not effective against viral infections. Not every case of bacterial otitis media requires antibiotics and depending on age, severity of infection, and other risk factors, observation may be all that is required. Ear drops or oral medicines may be prescribed to reduce pain, fever, or congestion. Babies with ear infections should not be fed while lying on their backs. This increases the pressure and pain in the ear. Do not put cotton in the ear canal or clean it with cotton swabs. Swimming should be avoided if the eardrum has ruptured or if there is drainage from the ear canal. If your child experiences recurrent infections, your child may need to be referred to an Ear, Nose, and Throat specialist. HOME CARE INSTRUCTIONS   Take any antibiotic as directed by your caregiver. You or your child may feel better in a few days, but take all medicine or the infection may not respond and may become more  difficult to treat.   Only take over-the-counter or prescription medicines for pain, discomfort, or fever as directed by your caregiver. Do not give aspirin to children.  Otitis media can lead to complications including rupture of the eardrum, long-term hearing loss, and more severe infections. Call your caregiver for follow-up care at the end of  treatment. SEEK IMMEDIATE MEDICAL CARE IF:   Your or your child's problems do not improve within 2 to 3 days.   You or your child has an oral temperature above 102 F (38.9 C), not controlled by medicine.   Your baby is older than 3 months with a rectal temperature of 102 F (38.9 C) or higher.   Your baby is 49 months old or younger with a rectal temperature of 100.4 F (38 C) or higher.   Your child develops increased fussiness.   You or your child develops a stiff neck, severe headache, or confusion.   There is swelling around the ear.   There is dizziness, vomiting, unusual sleepiness, seizures, or twitching of facial muscles.   The pain or ear drainage persists beyond 2 days of antibiotic treatment.  Document Released: 01/14/2005 Document Revised: 08/19/2011 Document Reviewed: 04/04/2010 [facility] Patient Information 2012 [place], [place].Otitis Externa Otitis externa ("swimmer's ear") is a germ (bacterial) or fungal infection of the outer ear canal (from the eardrum to the outside of the ear). Swimming in dirty water may cause swimmer's ear. It also may be caused by moisture in the ear from water remaining after swimming or bathing. Often the first signs of infection may be itching in the ear canal. This may progress to ear canal swelling, redness, and pus drainage, which may be signs of infection. HOME CARE INSTRUCTIONS   Apply the antibiotic drops to the ear canal as prescribed by your doctor.   This can be a very painful medical condition. A strong pain reliever may be prescribed.   Only take over-the-counter or prescription medicines for pain, discomfort, or fever as directed by your caregiver.   If your caregiver has given you a follow-up appointment, it is very important to keep that appointment. Not keeping the appointment could result in a chronic or permanent injury, pain, hearing loss and disability. If there is any problem keeping the appointment, you must call back to  this facility for assistance.  PREVENTION   It is important to keep your ear dry. Use the corner of a towel to wick water out of the ear canal after swimming or bathing.   Avoid scratching in your ear. This can damage the ear canal or remove the protective wax lining the canal and make it easier for germs (bacteria) or a fungus to grow.   You may use ear drops made of rubbing alcohol and vinegar after swimming to prevent future "swimmer's ear" infections. Make up a small bottle of equal parts white vinegar and alcohol. Put 3 or 4 drops into each ear after swimming.   Avoid swimming in lakes, polluted water, or poorly chlorinated pools.  SEEK MEDICAL CARE IF:   An oral temperature above 102 F (38.9 C) develops.   Your ear is still painful after 3 days and shows signs of getting worse (redness, swelling, pain, or pus).  MAKE SURE YOU:   Understand these instructions.   Will watch your condition.   Will get help right away if you are not doing well or get worse.  Document Released: 12/07/2005 Document Revised: 08/19/2011 Document Reviewed: 07/13/2008 [facility] Patient Information 2012 [place]. 

## 2011-12-07 ENCOUNTER — Ambulatory Visit (INDEPENDENT_AMBULATORY_CARE_PROVIDER_SITE_OTHER): Payer: Medicare Other | Admitting: Family Medicine

## 2011-12-07 DIAGNOSIS — Z23 Encounter for immunization: Secondary | ICD-10-CM

## 2011-12-07 NOTE — Progress Notes (Signed)
Patient ID: [name], male   DOB: Sep 21, 1941, 70 y.o.   MRN: [id number] Flu shot administered

## 2012-01-05 ENCOUNTER — Telehealth: Payer: Self-pay | Admitting: Family Medicine

## 2012-01-05 NOTE — Telephone Encounter (Signed)
Patient called left a messg voicemail req a call back to discuss med refill but he did not say what med he needed refilled

## 2012-01-06 ENCOUNTER — Other Ambulatory Visit: Payer: Self-pay | Admitting: *Deleted

## 2012-01-06 MED ORDER — SIMVASTATIN 40 MG PO TABS: 40.0000 mg | ORAL_TABLET | Freq: Every day | ORAL | Status: DC

## 2012-01-06 NOTE — Telephone Encounter (Signed)
Let message on vm that we need to know what medication he needed and he can call pharm for refill

## 2012-01-26 DIAGNOSIS — L820 Inflamed seborrheic keratosis: Secondary | ICD-10-CM | POA: Diagnosis not present

## 2012-01-26 DIAGNOSIS — D485 Neoplasm of uncertain behavior of skin: Secondary | ICD-10-CM | POA: Diagnosis not present

## 2012-01-26 DIAGNOSIS — L570 Actinic keratosis: Secondary | ICD-10-CM | POA: Diagnosis not present

## 2012-01-26 DIAGNOSIS — D239 Other benign neoplasm of skin, unspecified: Secondary | ICD-10-CM | POA: Diagnosis not present

## 2012-02-09 DIAGNOSIS — H2510 Age-related nuclear cataract, unspecified eye: Secondary | ICD-10-CM | POA: Diagnosis not present

## 2012-03-10 DIAGNOSIS — B029 Zoster without complications: Secondary | ICD-10-CM | POA: Diagnosis not present

## 2012-03-15 ENCOUNTER — Encounter: Payer: Self-pay | Admitting: Family Medicine

## 2012-03-15 ENCOUNTER — Ambulatory Visit (INDEPENDENT_AMBULATORY_CARE_PROVIDER_SITE_OTHER): Payer: Medicare Other | Admitting: Family Medicine

## 2012-03-15 VITALS — BP 144/81 | HR 57 | Ht 71.0 in | Wt 214.0 lb

## 2012-03-15 DIAGNOSIS — B029 Zoster without complications: Secondary | ICD-10-CM | POA: Diagnosis not present

## 2012-03-15 MED ORDER — LIDOCAINE HCL 2 % EX GEL: Freq: Three times a day (TID) | CUTANEOUS | Status: DC | PRN

## 2012-03-15 MED ORDER — VALACYCLOVIR HCL 1 G PO TABS: 1000.0000 mg | ORAL_TABLET | Freq: Three times a day (TID) | ORAL | Status: DC

## 2012-03-15 NOTE — Patient Instructions (Signed)

## 2012-03-15 NOTE — Progress Notes (Signed)
  Subjective:    Patient ID: [name], male    DOB: 12-08-1941, 71 y.o.   MRN: [id number]  HPI [facility] and dx with shingles. Told to take benadry.  Says started 9 days ago. Went to UC on Thursday 6 days ago. Says he never had his shingles vaccine. Says the rx sat on his fridge for the last 6 weeks.  Says noticed a cluster of "pimples on the back of his neck. Says not painful or itchy and just put some cream on them. Then about 2 days later noticed new lesions and these felt sore, mildly painful.    Review of Systems     Objective:   Physical Exam  Constitutional: He appears well-developed and well-nourished.  HENT:  Head: Normocephalic and atraumatic.  Skin: Skin is warm and dry.       Dried cluster of scabs.  Also some fine erythematous papules on the right upper shoulder near the neck.  He also has 4 fine erythematous papules on the left lower abdomen.   Psychiatric: He has a normal mood and affect. His behavior is normal.          Assessment & Plan:  Shingles. Will tx with valtrex 1 gram TID x 7 days. Lidocaine gel for pain relief.  Follow up if not resolving or if pain worsens.  Culture was not obtained.  No vesicle currnelty for culture.

## 2012-04-12 DIAGNOSIS — B029 Zoster without complications: Secondary | ICD-10-CM | POA: Diagnosis not present

## 2012-04-12 DIAGNOSIS — L570 Actinic keratosis: Secondary | ICD-10-CM | POA: Diagnosis not present

## 2012-04-12 DIAGNOSIS — L820 Inflamed seborrheic keratosis: Secondary | ICD-10-CM | POA: Diagnosis not present

## 2012-04-21 DIAGNOSIS — H53139 Sudden visual loss, unspecified eye: Secondary | ICD-10-CM | POA: Diagnosis not present

## 2012-04-22 ENCOUNTER — Telehealth: Payer: Self-pay | Admitting: Family Medicine

## 2012-04-22 NOTE — Telephone Encounter (Signed)
Please call patient. I just received the consult note from [facility]. They were concerned that he may have had either a small mini stroke or possibly an ocular migraine. I would like him to make an appointment so that we can evaluate this further. In the meantime please start a aspirin daily. 325mg , if he is not already taking one. See if can put on schedule for Monday.

## 2012-04-22 NOTE — Telephone Encounter (Signed)
Called home and cell no answer and no vm

## 2012-04-25 ENCOUNTER — Encounter: Payer: Self-pay | Admitting: Family Medicine

## 2012-04-25 ENCOUNTER — Ambulatory Visit (INDEPENDENT_AMBULATORY_CARE_PROVIDER_SITE_OTHER): Payer: Medicare Other | Admitting: Family Medicine

## 2012-04-25 VITALS — BP 117/66 | HR 81 | Ht 71.0 in | Wt 213.0 lb

## 2012-04-25 DIAGNOSIS — R0602 Shortness of breath: Secondary | ICD-10-CM

## 2012-04-25 DIAGNOSIS — H539 Unspecified visual disturbance: Secondary | ICD-10-CM | POA: Diagnosis not present

## 2012-04-25 DIAGNOSIS — E785 Hyperlipidemia, unspecified: Secondary | ICD-10-CM

## 2012-04-25 DIAGNOSIS — E039 Hypothyroidism, unspecified: Secondary | ICD-10-CM

## 2012-04-25 DIAGNOSIS — I10 Essential (primary) hypertension: Secondary | ICD-10-CM

## 2012-04-25 DIAGNOSIS — R5381 Other malaise: Secondary | ICD-10-CM

## 2012-04-25 NOTE — Telephone Encounter (Signed)
Pt was notified and was seen in the office today

## 2012-04-25 NOTE — Patient Instructions (Signed)
We will call you with your lab results. If you don't here from us in about a week then please give us a call at [phone].  

## 2012-04-25 NOTE — Progress Notes (Signed)
Subjective:    Patient ID: [name], male    DOB: [birth year], 71 y.o.   MRN: [id number]  HPI Was on the computer for about 2 hours and that evening all the sudden realized that there was something in his eye or vision on the side of his left eye. Felt like seeing shimmering light waves on the side of his vision.  No dots or blurriness or etc.  So decided to have his eyes checked.  Says episodes lasted maybe 20 min.  No other hearing or vision changes. No extremity weakness.  Was taking 81mg  ASA daily. The optometrist felt like there was evidence of either ocular migraine or possibly minister especially based on his age and with his history of high cholesterol.  Inc to 325 over the weekend. More easily fatigued.  Say more SOB with activity gradually over time. Says overall he is less activity.    Review of Systems  BP 117/66  Pulse 81  Ht 5\' 11"  (1.803 m)  Wt 213 lb (96.616 kg)  BMI 29.71 kg/m2    No Known Allergies  Past Medical History  Diagnosis Date  . Insulin resistance   . LV dysfunction     reduction  . Osteoarthritis   . Hearing aid worn   . Abdominal wall defect, acquired   . Blockage of coronary artery of heart     30%    Past Surgical History  Procedure Date  . Acne cyst removal 2012    Back   . Appendectomy   . Tonsillectomy   . Cardiac catheterization   . Rt hip replacement     History   Social History  . Marital Status: Married    Spouse Name: [name]    Number of Children: [name]  . Years of Education: [name]   Occupational History  . Not on file.   Social History Main Topics  . Smoking status: Former Smoker    Quit date: 12/21/1985  . Smokeless tobacco: Not on file  . Alcohol Use: No  . Drug Use: No  . 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 Not on file   Social History Narrative  . No narrative on file    Family History  Problem Relation Age of Onset  . Heart  disease Mother     pacemaker    Outpatient Encounter Prescriptions as of 04/25/2012  Medication Sig Dispense Refill  . fexofenadine-pseudoephedrine (ALLEGRA-D 24 HOUR) 180-240 MG per 24 hr tablet Take 1 tablet by mouth daily.  30 tablet  1  . levothyroxine (SYNTHROID, LEVOTHROID) 75 MCG tablet Take 1 tablet (75 mcg total) by mouth daily.  90 tablet  3  . lidocaine (XYLOCAINE) 2 % jelly Apply topically 3 (three) times daily as needed.  30 mL  0  . meloxicam (MOBIC) 15 MG tablet Take 1 tablet (15 mg total) by mouth daily as needed.  90 tablet  3  . metoprolol (TOPROL-XL) 100 MG 24 hr tablet Take 100 mg by mouth 2 (two) times daily.        . Multiple Vitamin (MULTIVITAMIN) tablet Take 1 tablet by mouth daily.        [name] omeprazole (PRILOSEC) 20 MG capsule Take 20 mg by mouth daily.        . simvastatin (ZOCOR) 40 MG tablet Take 1 tablet (40 mg total) by mouth at bedtime.  90  tablet  1  . valACYclovir (VALTREX) 1000 MG tablet Take 1 tablet (1,000 mg total) by mouth 3 (three) times daily. X 7 days  21 tablet  0          Objective:   Physical Exam  Constitutional: He is oriented to person, place, and time. He appears well-developed and well-nourished.  HENT:  Head: Normocephalic and atraumatic.  Right Ear: External ear normal.  Left Ear: External ear normal.  Nose: Nose normal.  Mouth/Throat: Oropharynx is clear and moist.       TMs and canals are clear.   Eyes: Conjunctivae and EOM are normal. Pupils are equal, round, and reactive to light.  Neck: Neck supple. No thyromegaly present.  Cardiovascular: Normal rate, regular rhythm and normal heart sounds.   Pulmonary/Chest: Effort normal and breath sounds normal.  Lymphadenopathy:    He has no cervical adenopathy.  Neurological: He is alert and oriented to person, place, and time. He has normal reflexes. He displays normal reflexes. No cranial nerve deficit. He exhibits normal muscle tone. Coordination normal.       UE and LE strength is  5/5 bilaterally.  Reflexes 2+ in UE and LE.  Normal rapid alternating movement of the hands.    Skin: Skin is warm and dry.  Psychiatric: He has a normal mood and affect. His behavior is normal.          Assessment & Plan:  Possible mini-strokes - Will schedule for carotid artery [place]. Continue ASA325mg . Will recheck chol levels and make sure therapy is maximized.  He is on a statin.   I would like him see the cardiologist again.  I didn't perform EKG today. No CP.  BP well controlled. Check sed rate and CMP.   Hypothyroid - recheck level.    SOB and fatigue - so I would like him to f/u with his cardiology. He prefer we schedule this for him. He was last seen at [facility] cardiology. Recheck thyroid level.

## 2012-05-04 DIAGNOSIS — H539 Unspecified visual disturbance: Secondary | ICD-10-CM | POA: Diagnosis not present

## 2012-05-04 DIAGNOSIS — E039 Hypothyroidism, unspecified: Secondary | ICD-10-CM | POA: Diagnosis not present

## 2012-05-04 DIAGNOSIS — E785 Hyperlipidemia, unspecified: Secondary | ICD-10-CM | POA: Diagnosis not present

## 2012-05-04 LAB — TSH: TSH: 1.201 u[IU]/mL (ref 0.350–4.500)

## 2012-05-04 LAB — LIPID PANEL: LDL Cholesterol: 67 mg/dL (ref 0–99)

## 2012-05-04 LAB — SEDIMENTATION RATE: Sed Rate: 4 mm/hr (ref 0–16)

## 2012-05-05 ENCOUNTER — Other Ambulatory Visit: Payer: Medicare Other

## 2012-05-05 ENCOUNTER — Ambulatory Visit
Admission: RE | Admit: 2012-05-05 | Discharge: 2012-05-05 | Disposition: A | Discharge status: Home / Self Care | Payer: Medicare Other | Source: Ambulatory Visit | Attending: Family Medicine | Admitting: Family Medicine

## 2012-05-05 DIAGNOSIS — H539 Unspecified visual disturbance: Secondary | ICD-10-CM

## 2012-05-05 DIAGNOSIS — I10 Essential (primary) hypertension: Secondary | ICD-10-CM

## 2012-05-05 DIAGNOSIS — E785 Hyperlipidemia, unspecified: Secondary | ICD-10-CM

## 2012-05-05 DIAGNOSIS — E039 Hypothyroidism, unspecified: Secondary | ICD-10-CM

## 2012-05-05 DIAGNOSIS — G459 Transient cerebral ischemic attack, unspecified: Secondary | ICD-10-CM | POA: Diagnosis not present

## 2012-05-05 DIAGNOSIS — I6529 Occlusion and stenosis of unspecified carotid artery: Secondary | ICD-10-CM | POA: Diagnosis not present

## 2012-05-05 LAB — COMPLETE METABOLIC PANEL WITH GFR
ALT: 15 U/L (ref 0–53)
Albumin: 4.3 g/dL (ref 3.5–5.2)
CO2: 26 mEq/L (ref 19–32)
Chloride: 107 mEq/L (ref 96–112)
GFR, Est African American: >89 mL/min
GFR, Est Non African American: 87 mL/min
Glucose, Bld: 102 mg/dL — ABNORMAL HIGH (ref 70–99)
Potassium: 4.6 mEq/L (ref 3.5–5.3)
Sodium: 142 mEq/L (ref 135–145)
Total Bilirubin: 0.6 mg/dL (ref 0.3–1.2)
Total Protein: 6.3 g/dL (ref 6.0–8.3)

## 2012-05-12 DIAGNOSIS — I2510 Atherosclerotic heart disease of native coronary artery without angina pectoris: Secondary | ICD-10-CM | POA: Diagnosis not present

## 2012-05-12 DIAGNOSIS — R000 Tachycardia, unspecified: Secondary | ICD-10-CM | POA: Diagnosis not present

## 2012-05-12 DIAGNOSIS — E7800 Pure hypercholesterolemia, unspecified: Secondary | ICD-10-CM | POA: Diagnosis not present

## 2012-05-12 DIAGNOSIS — G459 Transient cerebral ischemic attack, unspecified: Secondary | ICD-10-CM | POA: Diagnosis not present

## 2012-05-26 DIAGNOSIS — I059 Rheumatic mitral valve disease, unspecified: Secondary | ICD-10-CM | POA: Diagnosis not present

## 2012-05-30 ENCOUNTER — Telehealth: Payer: Self-pay | Admitting: Family Medicine

## 2012-05-30 NOTE — Telephone Encounter (Signed)
Call pt: Echo results show no intracardiac shunt that would cuase a TIA. This is good news. The function or pumping action of the heart is normal.  The relaxing of the heart was a  milding dysfunctional. This is ok. We just have to keep BP well controlled to that the heart stays in good condition.  Continue meds

## 2012-05-31 NOTE — Telephone Encounter (Signed)
LM for pt to returncall

## 2012-06-03 NOTE — Telephone Encounter (Signed)
Pt.notified

## 2012-06-21 ENCOUNTER — Encounter: Payer: Self-pay | Admitting: Family Medicine

## 2012-08-03 ENCOUNTER — Other Ambulatory Visit: Payer: Self-pay | Admitting: *Deleted

## 2012-08-03 DIAGNOSIS — E039 Hypothyroidism, unspecified: Secondary | ICD-10-CM

## 2012-08-03 MED ORDER — LEVOTHYROXINE SODIUM 75 MCG PO TABS: 75.0000 ug | ORAL_TABLET | Freq: Every day | ORAL | Status: DC

## 2012-08-11 DIAGNOSIS — S139XXA Sprain of joints and ligaments of unspecified parts of neck, initial encounter: Secondary | ICD-10-CM | POA: Diagnosis not present

## 2012-08-11 DIAGNOSIS — M999 Biomechanical lesion, unspecified: Secondary | ICD-10-CM | POA: Diagnosis not present

## 2012-08-11 DIAGNOSIS — M9981 Other biomechanical lesions of cervical region: Secondary | ICD-10-CM | POA: Diagnosis not present

## 2012-08-23 DIAGNOSIS — M999 Biomechanical lesion, unspecified: Secondary | ICD-10-CM | POA: Diagnosis not present

## 2012-08-23 DIAGNOSIS — M9981 Other biomechanical lesions of cervical region: Secondary | ICD-10-CM | POA: Diagnosis not present

## 2012-08-23 DIAGNOSIS — S139XXA Sprain of joints and ligaments of unspecified parts of neck, initial encounter: Secondary | ICD-10-CM | POA: Diagnosis not present

## 2012-09-13 DIAGNOSIS — R6889 Other general symptoms and signs: Secondary | ICD-10-CM | POA: Diagnosis not present

## 2012-09-13 DIAGNOSIS — S335XXA Sprain of ligaments of lumbar spine, initial encounter: Secondary | ICD-10-CM | POA: Diagnosis not present

## 2012-10-05 DIAGNOSIS — M999 Biomechanical lesion, unspecified: Secondary | ICD-10-CM | POA: Diagnosis not present

## 2012-10-05 DIAGNOSIS — R6889 Other general symptoms and signs: Secondary | ICD-10-CM | POA: Diagnosis not present

## 2012-10-05 DIAGNOSIS — S335XXA Sprain of ligaments of lumbar spine, initial encounter: Secondary | ICD-10-CM | POA: Diagnosis not present

## 2012-10-06 DIAGNOSIS — S335XXA Sprain of ligaments of lumbar spine, initial encounter: Secondary | ICD-10-CM | POA: Diagnosis not present

## 2012-10-06 DIAGNOSIS — M999 Biomechanical lesion, unspecified: Secondary | ICD-10-CM | POA: Diagnosis not present

## 2012-10-10 DIAGNOSIS — M999 Biomechanical lesion, unspecified: Secondary | ICD-10-CM | POA: Diagnosis not present

## 2012-10-10 DIAGNOSIS — S335XXA Sprain of ligaments of lumbar spine, initial encounter: Secondary | ICD-10-CM | POA: Diagnosis not present

## 2012-10-13 DIAGNOSIS — M999 Biomechanical lesion, unspecified: Secondary | ICD-10-CM | POA: Diagnosis not present

## 2012-10-13 DIAGNOSIS — S335XXA Sprain of ligaments of lumbar spine, initial encounter: Secondary | ICD-10-CM | POA: Diagnosis not present

## 2012-10-17 DIAGNOSIS — S335XXA Sprain of ligaments of lumbar spine, initial encounter: Secondary | ICD-10-CM | POA: Diagnosis not present

## 2012-10-17 DIAGNOSIS — M999 Biomechanical lesion, unspecified: Secondary | ICD-10-CM | POA: Diagnosis not present

## 2012-10-25 DIAGNOSIS — M999 Biomechanical lesion, unspecified: Secondary | ICD-10-CM | POA: Diagnosis not present

## 2012-10-25 DIAGNOSIS — S335XXA Sprain of ligaments of lumbar spine, initial encounter: Secondary | ICD-10-CM | POA: Diagnosis not present

## 2012-11-01 ENCOUNTER — Other Ambulatory Visit: Payer: Self-pay | Admitting: *Deleted

## 2012-11-01 DIAGNOSIS — R52 Pain, unspecified: Secondary | ICD-10-CM

## 2012-11-01 MED ORDER — MELOXICAM 15 MG PO TABS: 15.0000 mg | ORAL_TABLET | Freq: Every day | ORAL | Status: DC | PRN

## 2012-11-03 ENCOUNTER — Other Ambulatory Visit: Payer: Self-pay | Admitting: *Deleted

## 2012-11-03 DIAGNOSIS — M999 Biomechanical lesion, unspecified: Secondary | ICD-10-CM | POA: Diagnosis not present

## 2012-11-03 DIAGNOSIS — S335XXA Sprain of ligaments of lumbar spine, initial encounter: Secondary | ICD-10-CM | POA: Diagnosis not present

## 2012-11-10 DIAGNOSIS — M999 Biomechanical lesion, unspecified: Secondary | ICD-10-CM | POA: Diagnosis not present

## 2012-11-10 DIAGNOSIS — S335XXA Sprain of ligaments of lumbar spine, initial encounter: Secondary | ICD-10-CM | POA: Diagnosis not present

## 2012-11-16 DIAGNOSIS — M999 Biomechanical lesion, unspecified: Secondary | ICD-10-CM | POA: Diagnosis not present

## 2012-11-16 DIAGNOSIS — S335XXA Sprain of ligaments of lumbar spine, initial encounter: Secondary | ICD-10-CM | POA: Diagnosis not present

## 2012-11-24 ENCOUNTER — Ambulatory Visit (INDEPENDENT_AMBULATORY_CARE_PROVIDER_SITE_OTHER): Payer: Medicare Other | Admitting: Family Medicine

## 2012-11-24 DIAGNOSIS — Z23 Encounter for immunization: Secondary | ICD-10-CM

## 2012-11-29 DIAGNOSIS — S335XXA Sprain of ligaments of lumbar spine, initial encounter: Secondary | ICD-10-CM | POA: Diagnosis not present

## 2012-11-29 DIAGNOSIS — M999 Biomechanical lesion, unspecified: Secondary | ICD-10-CM | POA: Diagnosis not present

## 2012-12-05 ENCOUNTER — Ambulatory Visit (INDEPENDENT_AMBULATORY_CARE_PROVIDER_SITE_OTHER): Payer: Medicare Other | Admitting: Sports Medicine

## 2012-12-05 ENCOUNTER — Ambulatory Visit (INDEPENDENT_AMBULATORY_CARE_PROVIDER_SITE_OTHER): Payer: Medicare Other

## 2012-12-05 ENCOUNTER — Ambulatory Visit: Payer: Medicare Other | Admitting: Family Medicine

## 2012-12-05 ENCOUNTER — Encounter: Payer: Self-pay | Admitting: Sports Medicine

## 2012-12-05 VITALS — BP 120/69 | HR 70 | Wt 208.0 lb

## 2012-12-05 DIAGNOSIS — M1610 Unilateral primary osteoarthritis, unspecified hip: Secondary | ICD-10-CM

## 2012-12-05 DIAGNOSIS — M545 Low back pain, unspecified: Secondary | ICD-10-CM | POA: Insufficient documentation

## 2012-12-05 DIAGNOSIS — R52 Pain, unspecified: Secondary | ICD-10-CM

## 2012-12-05 DIAGNOSIS — M169 Osteoarthritis of hip, unspecified: Secondary | ICD-10-CM | POA: Diagnosis not present

## 2012-12-05 MED ORDER — MELOXICAM 15 MG PO TABS: 15.0000 mg | ORAL_TABLET | Freq: Every day | ORAL | Status: DC | PRN

## 2012-12-05 NOTE — Assessment & Plan Note (Signed)
This is multifactorial with central canal stenosis worse at L4-L5. His MRI is old, from 2002. I would like him to continue meloxicam, and do some home rehabilitation exercises. If no better in 4 weeks or so, we certainly should consider an additional MRI, for interventional injection planning. This would likely be interlaminar injections at the L4-L5 level.

## 2012-12-05 NOTE — Assessment & Plan Note (Signed)
I would like an additional x-ray of his hip and pelvis. He will continue meloxicam, and make an appointment to see me again for an intra-articular ultrasound guided injection.

## 2012-12-05 NOTE — Progress Notes (Signed)
Subjective:    CC: Medication refill  HPI: Left hip arthritis: [name] has been on meloxicam for sometime now. Unfortunately, his pain is not well controlled. He has known intra-articular DJD of the left hip, and has never had an injection. Pain is localized in the groin, and is fairly severe. He is status post right total hip arthroplasty.  Low back pain: Has also been present for decades, he has known multilevel spinal stenosis, as well as degenerative disc disease. He did have an injection years back which sounds to be an epidural, which worked very well. The pain is localized, is not bad with any movement, but is worse with getting up and down, it does not radiate, and is axial.  He needs refills on meloxicam.  Past medical history, Surgical history, Family history, Social history, Allergies, and medications have been entered into the medical record, reviewed, and no changes needed.   Review of Systems: No fevers, chills, night sweats, weight loss, chest pain, or shortness of breath.   Objective:    General: Well Developed, well nourished, and in no acute distress.  Neuro: Alert and oriented x3, extra-ocular muscles intact.  HEENT: Normocephalic, atraumatic, pupils equal round reactive to light, neck supple, no masses, no lymphadenopathy, thyroid nonpalpable.  Skin: Warm and dry, no rashes. Cardiac: Regular rate and rhythm, no murmurs rubs or gallops.  Respiratory: Clear to auscultation bilaterally. Not using accessory muscles, speaking in full sentences. Left Hip: ROM IR: 20 Deg, ER: 45 Deg, Flexion: 120 Deg, Extension: 100 Deg, Abduction: 45 Deg, Adduction: 45 Deg. Reproduction of exquisite pain with internal rotation past 20. Strength IR: 5/5, ER: 5/5, Flexion: 5/5, Extension: 5/5, Abduction: 5/5, Adduction: 5/5 Pelvic alignment unremarkable to inspection and palpation. Standing hip rotation and gait without trendelenburg sign / unsteadiness. Greater trochanter without  tenderness to palpation. No tenderness over piriformis and greater trochanter. No pain with FABER or FADIR. No SI joint tenderness and normal minimal SI movement.  Back Exam:  Inspection: Unremarkable  Motion: Flexion 45 deg, Extension 45 deg, Side Bending to 45 deg bilaterally,  Rotation to 45 deg bilaterally  SLR laying: Negative  XSLR laying: Negative  Palpable tenderness: None. FABER: negative. Sensory change: Gross sensation intact to all lumbar and sacral dermatomes.  Reflexes: 2+ at both patellar tendons, 2+ at achilles tendons, Babinski's downgoing.  Strength at foot  Plantar-flexion: 5/5 Dorsi-flexion: 5/5 Eversion: 5/5 Inversion: 5/5  Leg strength  Quad: 5/5 Hamstring: 5/5 Hip flexor: 5/5 Hip abductors: 5/5  Gait unremarkable.   the MRI report from 2002 was available, and she is multilevel spinal stenosis, worse at the L4-L5 level, the results of degenerative disc disease at multiple levels  Impression and Recommendations:

## 2012-12-06 DIAGNOSIS — M999 Biomechanical lesion, unspecified: Secondary | ICD-10-CM | POA: Diagnosis not present

## 2012-12-06 DIAGNOSIS — S335XXA Sprain of ligaments of lumbar spine, initial encounter: Secondary | ICD-10-CM | POA: Diagnosis not present

## 2012-12-08 ENCOUNTER — Encounter: Payer: Self-pay | Admitting: Sports Medicine

## 2012-12-08 ENCOUNTER — Ambulatory Visit (INDEPENDENT_AMBULATORY_CARE_PROVIDER_SITE_OTHER): Payer: Medicare Other | Admitting: Sports Medicine

## 2012-12-08 VITALS — BP 118/70 | Wt 208.0 lb

## 2012-12-08 DIAGNOSIS — M1610 Unilateral primary osteoarthritis, unspecified hip: Secondary | ICD-10-CM | POA: Diagnosis not present

## 2012-12-08 DIAGNOSIS — J069 Acute upper respiratory infection, unspecified: Secondary | ICD-10-CM

## 2012-12-08 DIAGNOSIS — R05 Cough: Secondary | ICD-10-CM

## 2012-12-08 DIAGNOSIS — H109 Unspecified conjunctivitis: Secondary | ICD-10-CM | POA: Diagnosis not present

## 2012-12-08 DIAGNOSIS — M169 Osteoarthritis of hip, unspecified: Secondary | ICD-10-CM | POA: Diagnosis not present

## 2012-12-08 DIAGNOSIS — R059 Cough, unspecified: Secondary | ICD-10-CM

## 2012-12-08 MED ORDER — ERYTHROMYCIN 5 MG/GM OP OINT: TOPICAL_OINTMENT | Freq: Three times a day (TID) | OPHTHALMIC | Status: AC

## 2012-12-08 MED ORDER — HYDROCOD POLST-CHLORPHEN POLST 10-8 MG/5ML PO LQCR: 5.0000 mL | Freq: Two times a day (BID) | ORAL | Status: DC | PRN

## 2012-12-08 NOTE — Assessment & Plan Note (Signed)
Ultrasound guided injection as above. He will come back to follow this up in about 4 weeks.

## 2012-12-08 NOTE — Assessment & Plan Note (Addendum)
Viral upper respiratory infection, with nonproductive cough. We will the Tussionex, he'll return to clinic for this if not better in a couple weeks. Erythromycin ointment.

## 2012-12-08 NOTE — Progress Notes (Signed)
Subjective:    CC: Followup  HPI: Left hip and buttock pain: Present for a long time, I suspected degenerative DJD, x-rays confirmed this. He has been using oral NSAIDs that have been only minimally effective. I also had him doing some rehabilitation exercises. He would like to consider interventional management today.  Cough: Present for a few days, nonproductive cough, as well as itchy, redness in his eyes. He wakes up with the eyes crusted shut. Runny nose is present, no fevers, chills, body aches, muscle aches, or GI symptoms. No sick contacts. Desires medicine for cough, as well as for his eyes.  Past medical history, Surgical history, Family history, Social history, Allergies, and medications have been entered into the medical record, reviewed, and no changes needed.   Review of Systems: No fevers, chills, night sweats, weight loss, chest pain, or shortness of breath.   Objective:    General: Well Developed, well nourished, and in no acute distress.  Neuro: Alert and oriented x3, extra-ocular muscles intact.  HEENT: Normocephalic, atraumatic, pupils equal round reactive to light, neck supple, no masses, no lymphadenopathy, thyroid nonpalpable. Both conjunctiva are injected. Oropharynx, nasopharynx, tympanic membranes are unremarkable to inspection. Skin: Warm and dry, no rashes. Cardiac: Regular rate and rhythm, no murmurs rubs or gallops.  Respiratory: Clear to auscultation bilaterally. Not using accessory muscles, speaking in full sentences. Left hip: Internal rotation is very painful past 10.  Procedure: Real-time Ultrasound Guided Injection of left femoroacetabular joint Device: GE Logiq E  Ultrasound guided injection is preferred based studies that show increased duration, increased effect, greater accuracy, decreased procedural pain, increased response rate, and decreased cost with ultrasound guided versus blind injection.  Verbal informed consent obtained.  Time-out  conducted.  Noted no overlying erythema, induration, or other signs of local infection.  Skin prepped in a sterile fashion.  Local anesthesia: Topical Ethyl chloride.  With sterile technique and under real time ultrasound guidance:  2 cc Kenalog 40, 4 cc lidocaine injected easily into the femoroacetabular joint. Completed without difficulty  Pain immediately resolved suggesting accurate placement of the medication.  Advised to call if fevers/chills, erythema, induration, drainage, or persistent bleeding.  Images permanently stored and available for review in the ultrasound unit.  Impression: Technically successful ultrasound guided injection.  Left hip x-ray shows severe DJD in the joint, right hip prosthesis is stable.  Impression and Recommendations:

## 2012-12-20 DIAGNOSIS — M999 Biomechanical lesion, unspecified: Secondary | ICD-10-CM | POA: Diagnosis not present

## 2012-12-20 DIAGNOSIS — S335XXA Sprain of ligaments of lumbar spine, initial encounter: Secondary | ICD-10-CM | POA: Diagnosis not present

## 2012-12-26 DIAGNOSIS — L905 Scar conditions and fibrosis of skin: Secondary | ICD-10-CM | POA: Diagnosis not present

## 2012-12-26 DIAGNOSIS — L570 Actinic keratosis: Secondary | ICD-10-CM | POA: Diagnosis not present

## 2012-12-27 ENCOUNTER — Encounter: Payer: Self-pay | Admitting: Family Medicine

## 2012-12-27 ENCOUNTER — Ambulatory Visit (INDEPENDENT_AMBULATORY_CARE_PROVIDER_SITE_OTHER): Payer: Medicare Other

## 2012-12-27 ENCOUNTER — Ambulatory Visit (INDEPENDENT_AMBULATORY_CARE_PROVIDER_SITE_OTHER): Payer: Medicare Other | Admitting: Family Medicine

## 2012-12-27 VITALS — BP 133/71 | HR 73 | Resp 18 | Wt 204.0 lb

## 2012-12-27 DIAGNOSIS — M79609 Pain in unspecified limb: Secondary | ICD-10-CM

## 2012-12-27 DIAGNOSIS — R5383 Other fatigue: Secondary | ICD-10-CM

## 2012-12-27 DIAGNOSIS — R5381 Other malaise: Secondary | ICD-10-CM | POA: Diagnosis not present

## 2012-12-27 DIAGNOSIS — M79673 Pain in unspecified foot: Secondary | ICD-10-CM

## 2012-12-27 DIAGNOSIS — E039 Hypothyroidism, unspecified: Secondary | ICD-10-CM | POA: Diagnosis not present

## 2012-12-27 DIAGNOSIS — Z87891 Personal history of nicotine dependence: Secondary | ICD-10-CM

## 2012-12-27 DIAGNOSIS — R0602 Shortness of breath: Secondary | ICD-10-CM

## 2012-12-27 DIAGNOSIS — R002 Palpitations: Secondary | ICD-10-CM

## 2012-12-27 DIAGNOSIS — R110 Nausea: Secondary | ICD-10-CM

## 2012-12-27 LAB — CBC WITH DIFFERENTIAL/PLATELET
Basophils Absolute: 0 10*3/uL (ref 0.0–0.1)
HCT: 40.4 % (ref 39.0–52.0)
Lymphocytes Relative: 16 % (ref 12–46)
Neutro Abs: 7.1 10*3/uL (ref 1.7–7.7)
Platelets: 240 10*3/uL (ref 150–400)
RDW: 13.9 % (ref 11.5–15.5)
WBC: 10.1 10*3/uL (ref 4.0–10.5)

## 2012-12-27 NOTE — Patient Instructions (Addendum)
Please increase her Prilosec to twice a day. Can apply Aquaphor to your heel 2-3 times possible over the next couple of days. Try to keep as much pressure off her heels possible. He can also buy an over-the-counter lotion or cream that contains a product called urea. This will help remove some of the dead skin cells. We will call you suture lab results are back in. We will also call us at her chest x-ray results are back in. I do encourage that you try to get back in with Dr. Leeann Must to discuss her statin. Her EKG doesn't normal today.

## 2012-12-27 NOTE — Progress Notes (Signed)
Subjective:    Patient ID: [name], male    DOB: [birth year], 72 y.o.   MRN: [id number]  HPI 4 days of heel pain.  Normally doesn't walk around barefoot.  Thinks there may be something imbedded in the skin, but doesn't remember actually stepping on something.. No fever.  No active drainage or erythema.  It is tender.   Has been SOB, having waves of nausea that started 5 days ago.  No fever. Feels better today.  Says his heart pounds in the AM, says not fast but is pounding. No CP.  No tingling down into the arm. No known triggers or worsening or alleviating factors. Nausea usually happens about an hour after he eats. This started about 4 or 5 days ago as well. No sure if really has indigestion.  Feels weak overall.  Has felt dizzy a couple of time since then as well. Has had more aches and pain sine being on the lipitor compared to the simvastain. Has been taking ;prilosec bc of taking meloxicam regularly.  He does so he's been having more reflux lately. He does take Prilosec daily. He says he has had all these symptoms at different times in his life separately but never all together and that's why he brought it up today.   Review of Systems BP 133/71  Pulse 73  Resp 18  Wt 204 lb (92.534 kg)  SpO2 98%    No Known Allergies  Past Medical History  Diagnosis Date  . Insulin resistance   . LV dysfunction     reduction  . Osteoarthritis   . Hearing aid worn   . Abdominal wall defect, acquired   . Blockage of coronary artery of heart     30%    Past Surgical History  Procedure Date  . Acne cyst removal 2012    Back   . Appendectomy   . Tonsillectomy   . Cardiac catheterization   . Rt hip replacement     History   Social History  . Marital Status: Married    Spouse Name: [name]    Number of Children: [name]  . Years of Education: [name]   Occupational History  . Not on file.   Social History Main Topics  . Smoking status: Former Smoker    Quit date: 12/21/1985  .  Smokeless tobacco: Not on file  . Alcohol Use: No  . Drug Use: No  . 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 Not on file   Social History Narrative  . No narrative on file    Family History  Problem Relation Age of Onset  . Heart disease Mother     pacemaker    Outpatient Encounter Prescriptions as of 12/27/2012  Medication Sig Dispense Refill  . atorvastatin (LIPITOR) 40 MG tablet       . levothyroxine (SYNTHROID, LEVOTHROID) 75 MCG tablet Take 1 tablet (75 mcg total) by mouth daily.  90 tablet  1  . meloxicam (MOBIC) 15 MG tablet Take 1 tablet (15 mg total) by mouth daily as needed.  90 tablet  3  . metoprolol (LOPRESSOR) 50 MG tablet Take 1 tablet (50 mg total) by mouth 2 (two) times daily.      . Multiple Vitamin (MULTIVITAMIN) tablet Take 1 tablet by mouth daily.        [name] omeprazole (PRILOSEC) 20 MG capsule Take 20  mg by mouth daily.        . [DISCONTINUED] chlorpheniramine-HYDROcodone (TUSSIONEX) 10-8 MG/5ML LQCR Take 5 mLs by mouth every 12 (twelve) hours as needed (cough, will cause drowsiness.).  120 mL  0          Objective:   Physical Exam  Constitutional: He is oriented to person, place, and time. He appears well-developed and well-nourished.  HENT:  Head: Normocephalic and atraumatic.  Eyes: Conjunctivae normal are normal. Pupils are equal, round, and reactive to light.  Neck: Normal range of motion. Neck supple. No thyromegaly present.  Cardiovascular: Normal rate, regular rhythm and normal heart sounds.        No carotid bruits  Pulmonary/Chest: Effort normal and breath sounds normal.  Musculoskeletal: He exhibits no edema.  Lymphadenopathy:    He has no cervical adenopathy.  Neurological: He is alert and oriented to person, place, and time.  Skin: Skin is warm and dry.  Psychiatric: He has a normal mood and affect. His behavior is normal.   Left heel with a small 1 cm  crack. Unable to see an foreign object        Assessment & Plan:  Heel cracks - Treat with Aquaphor BID and get a urea product.  If not significantly better within 3-4 days then please call the office. Also check a blood pressure off of it as well as the next couple of days. Right now I don't see an indication of a foreign body. If she's not improving consider getting an x-ray just to rule out a piece of glass.  SOB - Will get CXR. Lung exam is clear today. Will get EKG as well. Please see interpretation below. He denies any other respiratory symptoms such as cough.  Fatigue - Will check TSH, CBC w/ diff.  He wasn't b12 checked since has beenon a PPI for time.   GERD - increase your prilosec to BID for a couple weeks to get symptoms under control and then try to go back down to once a day. He may need to eventually come off the meloxicam if it is causing some GI irritation. This could be contributing to the nausea..     EKG- Rate of 62 beats per minute, normal sinus rhythm, normal axis. There is a PAC.

## 2012-12-28 DIAGNOSIS — M999 Biomechanical lesion, unspecified: Secondary | ICD-10-CM | POA: Diagnosis not present

## 2012-12-28 DIAGNOSIS — S335XXA Sprain of ligaments of lumbar spine, initial encounter: Secondary | ICD-10-CM | POA: Diagnosis not present

## 2012-12-28 LAB — COMPLETE METABOLIC PANEL WITH GFR
ALT: 24 U/L (ref 0–53)
AST: 20 U/L (ref 0–37)
Albumin: 4.3 g/dL (ref 3.5–5.2)
Calcium: 9.1 mg/dL (ref 8.4–10.5)
Chloride: 106 mEq/L (ref 96–112)
Potassium: 4.6 mEq/L (ref 3.5–5.3)

## 2012-12-28 LAB — VITAMIN B12: Vitamin B-12: 451 pg/mL (ref 211–911)

## 2012-12-29 ENCOUNTER — Encounter: Payer: Self-pay | Admitting: *Deleted

## 2012-12-30 DIAGNOSIS — E7800 Pure hypercholesterolemia, unspecified: Secondary | ICD-10-CM | POA: Diagnosis not present

## 2012-12-30 DIAGNOSIS — G459 Transient cerebral ischemic attack, unspecified: Secondary | ICD-10-CM | POA: Diagnosis not present

## 2012-12-30 DIAGNOSIS — I2510 Atherosclerotic heart disease of native coronary artery without angina pectoris: Secondary | ICD-10-CM | POA: Diagnosis not present

## 2012-12-30 DIAGNOSIS — I4949 Other premature depolarization: Secondary | ICD-10-CM | POA: Diagnosis not present

## 2012-12-30 DIAGNOSIS — R0602 Shortness of breath: Secondary | ICD-10-CM | POA: Diagnosis not present

## 2012-12-30 DIAGNOSIS — R42 Dizziness and giddiness: Secondary | ICD-10-CM | POA: Diagnosis not present

## 2013-01-04 DIAGNOSIS — M999 Biomechanical lesion, unspecified: Secondary | ICD-10-CM | POA: Diagnosis not present

## 2013-01-04 DIAGNOSIS — S335XXA Sprain of ligaments of lumbar spine, initial encounter: Secondary | ICD-10-CM | POA: Diagnosis not present

## 2013-01-05 ENCOUNTER — Ambulatory Visit: Payer: Medicare Other | Admitting: Sports Medicine

## 2013-01-10 DIAGNOSIS — S335XXA Sprain of ligaments of lumbar spine, initial encounter: Secondary | ICD-10-CM | POA: Diagnosis not present

## 2013-01-10 DIAGNOSIS — M999 Biomechanical lesion, unspecified: Secondary | ICD-10-CM | POA: Diagnosis not present

## 2013-01-16 DIAGNOSIS — R0602 Shortness of breath: Secondary | ICD-10-CM | POA: Diagnosis not present

## 2013-01-18 ENCOUNTER — Encounter: Payer: Self-pay | Admitting: Family Medicine

## 2013-01-18 DIAGNOSIS — R9439 Abnormal result of other cardiovascular function study: Secondary | ICD-10-CM | POA: Insufficient documentation

## 2013-01-19 DIAGNOSIS — S335XXA Sprain of ligaments of lumbar spine, initial encounter: Secondary | ICD-10-CM | POA: Diagnosis not present

## 2013-01-19 DIAGNOSIS — M999 Biomechanical lesion, unspecified: Secondary | ICD-10-CM | POA: Diagnosis not present

## 2013-01-25 DIAGNOSIS — S335XXA Sprain of ligaments of lumbar spine, initial encounter: Secondary | ICD-10-CM | POA: Diagnosis not present

## 2013-01-25 DIAGNOSIS — M999 Biomechanical lesion, unspecified: Secondary | ICD-10-CM | POA: Diagnosis not present

## 2013-01-26 DIAGNOSIS — I2510 Atherosclerotic heart disease of native coronary artery without angina pectoris: Secondary | ICD-10-CM | POA: Diagnosis not present

## 2013-01-26 DIAGNOSIS — I059 Rheumatic mitral valve disease, unspecified: Secondary | ICD-10-CM | POA: Diagnosis not present

## 2013-01-26 DIAGNOSIS — R9439 Abnormal result of other cardiovascular function study: Secondary | ICD-10-CM | POA: Diagnosis not present

## 2013-01-26 DIAGNOSIS — G459 Transient cerebral ischemic attack, unspecified: Secondary | ICD-10-CM | POA: Diagnosis not present

## 2013-01-26 DIAGNOSIS — I4949 Other premature depolarization: Secondary | ICD-10-CM | POA: Diagnosis not present

## 2013-01-26 DIAGNOSIS — R0602 Shortness of breath: Secondary | ICD-10-CM | POA: Diagnosis not present

## 2013-01-26 DIAGNOSIS — E7800 Pure hypercholesterolemia, unspecified: Secondary | ICD-10-CM | POA: Diagnosis not present

## 2013-01-27 ENCOUNTER — Encounter: Payer: Self-pay | Admitting: Family Medicine

## 2013-01-31 ENCOUNTER — Ambulatory Visit: Payer: Medicare Other | Admitting: Sports Medicine

## 2013-01-31 ENCOUNTER — Ambulatory Visit (INDEPENDENT_AMBULATORY_CARE_PROVIDER_SITE_OTHER): Payer: Medicare Other | Admitting: Sports Medicine

## 2013-01-31 ENCOUNTER — Ambulatory Visit (INDEPENDENT_AMBULATORY_CARE_PROVIDER_SITE_OTHER): Payer: Medicare Other

## 2013-01-31 DIAGNOSIS — M545 Low back pain, unspecified: Secondary | ICD-10-CM

## 2013-01-31 DIAGNOSIS — M1610 Unilateral primary osteoarthritis, unspecified hip: Secondary | ICD-10-CM | POA: Diagnosis not present

## 2013-01-31 DIAGNOSIS — M169 Osteoarthritis of hip, unspecified: Secondary | ICD-10-CM

## 2013-01-31 DIAGNOSIS — L738 Other specified follicular disorders: Secondary | ICD-10-CM | POA: Diagnosis not present

## 2013-01-31 DIAGNOSIS — D485 Neoplasm of uncertain behavior of skin: Secondary | ICD-10-CM | POA: Diagnosis not present

## 2013-01-31 DIAGNOSIS — M48061 Spinal stenosis, lumbar region without neurogenic claudication: Secondary | ICD-10-CM | POA: Diagnosis not present

## 2013-01-31 DIAGNOSIS — L821 Other seborrheic keratosis: Secondary | ICD-10-CM | POA: Diagnosis not present

## 2013-01-31 DIAGNOSIS — M5126 Other intervertebral disc displacement, lumbar region: Secondary | ICD-10-CM | POA: Diagnosis not present

## 2013-01-31 DIAGNOSIS — M47817 Spondylosis without myelopathy or radiculopathy, lumbosacral region: Secondary | ICD-10-CM | POA: Diagnosis not present

## 2013-01-31 DIAGNOSIS — D235 Other benign neoplasm of skin of trunk: Secondary | ICD-10-CM | POA: Diagnosis not present

## 2013-01-31 NOTE — Progress Notes (Signed)
  Subjective:    CC: Followup  HPI: Left hip pain: Status post right total hip arthroplasty, desires to avoid this on the left side. Approximately 2 months ago injected his femoral acetabular joint, pain continues to be improved, approximately 50% since before the injection. He has not been doing his home exercises. He continues to be localized in the left groin, it is mild. There is no limitation in activities and no longer wakes him up from sleep.  Low-back pain: Has been present for decades and he does recall a specific injury that is ago when he lifted something abnormally, and was able to stand up straight afterwards. Since then he's had chiropractic care which has helped significantly, but does not completely resolve his pain. He does recall an episode in the past of radiculitis radiating down the left leg. Pain is worse when getting up from sitting, and he does tend to prefer a forward flexed posture. Pain is not worse with Valsalva, and he denies any bowel or bladder changes. Denies any constitutional symptoms, pain is moderate.  Past medical history, Surgical history, Family history not pertinant except as noted below, Social history, Allergies, and medications have been entered into the medical record, reviewed, and no changes needed.   Review of Systems: No headache, visual changes, nausea, vomiting, diarrhea, constipation, dizziness, abdominal pain, skin rash, fevers, chills, night sweats, weight loss, swollen lymph nodes, body aches, joint swelling, muscle aches, chest pain, shortness of breath, mood changes, visual or auditory hallucinations.   Objective:   General: Well Developed, well nourished, and in no acute distress.  Neuro/Psych: Alert and oriented x3, extra-ocular muscles intact, able to move all 4 extremities, sensation grossly intact. Skin: Warm and dry, no rashes noted.  Respiratory: Not using accessory muscles, speaking in full sentences, trachea midline.  Cardiovascular:  Pulses palpable, no extremity edema. Abdomen: Does not appear distended. Back Exam:  Inspection: Unremarkable  Motion: Flexion 45 deg, Extension 45 deg, Side Bending to 45 deg bilaterally,  Rotation to 45 deg bilaterally  SLR laying: Negative  XSLR laying: Negative  Palpable tenderness: None. FABER: negative. Sensory change: Gross sensation intact to all lumbar and sacral dermatomes.  Reflexes: 2+ at both patellar tendons, 2+ at achilles tendons, Babinski's downgoing.  Strength at foot  Plantar-flexion: 5/5 Dorsi-flexion: 5/5 Eversion: 5/5 Inversion: 5/5  Leg strength  Quad: 5/5 Hamstring: 5/5 Hip flexor: 5/5 Hip abductors: 5/5  Gait unremarkable. Left hip shows internal rotation to about 15, non-tender/nonpainful.  Impression and Recommendations:   This case required medical decision making of moderate complexity.

## 2013-01-31 NOTE — Assessment & Plan Note (Signed)
He has symptoms of spinal stenosis, but also some symptoms of facet pain. He is already been through months and months of chiropractic care, oral medications, NSAIDs. Pain is improved but not much better. We will pursue MRI of the lumbar spine for interventional injection planning. I would like to see him back after results of the MRI.

## 2013-01-31 NOTE — Assessment & Plan Note (Addendum)
Over 50% improved after intra-articular hip injection on the left side 2 months ago. He's not been doing the rehabilitation exercises. I will re print these out for him, he declines formal therapy. He wants to do another injection within the next few weeks, and will let us know. We can do 4 to 5 injections per year if needed. At some point I would like to contact the [employer] drug rep to see if we can get a Synvisc-1 sample to put into his hip.

## 2013-02-03 ENCOUNTER — Ambulatory Visit: Payer: Medicare Other | Admitting: Sports Medicine

## 2013-02-03 ENCOUNTER — Encounter: Payer: Self-pay | Admitting: Family Medicine

## 2013-02-03 DIAGNOSIS — I480 Paroxysmal atrial fibrillation: Secondary | ICD-10-CM | POA: Insufficient documentation

## 2013-02-06 ENCOUNTER — Ambulatory Visit (INDEPENDENT_AMBULATORY_CARE_PROVIDER_SITE_OTHER): Payer: Medicare Other | Admitting: Sports Medicine

## 2013-02-06 DIAGNOSIS — M5137 Other intervertebral disc degeneration, lumbosacral region: Secondary | ICD-10-CM | POA: Diagnosis not present

## 2013-02-06 DIAGNOSIS — M51379 Other intervertebral disc degeneration, lumbosacral region without mention of lumbar back pain or lower extremity pain: Secondary | ICD-10-CM | POA: Diagnosis not present

## 2013-02-06 DIAGNOSIS — M545 Low back pain, unspecified: Secondary | ICD-10-CM

## 2013-02-06 NOTE — Assessment & Plan Note (Signed)
Multilevel spinal stenosis with multilevel left sided foraminal disc protrusions. Failed physical therapy, oral medications. I think we should proceed with interventional treatment. Interlaminar epidural at the left L5/S1 level.

## 2013-02-06 NOTE — Progress Notes (Signed)
  Subjective:    CC: Follow up  HPI: Low back pain: [name] has had pain for years, and has already been through physical therapy, oral steroids, muscle relaxers. We recently got an MRI of his lumbar spine. He does recall pain he localizes in the low back, better with flexion, he did have an occasional radiation down the posterior aspect of his left thigh past the knee, down to the foot.  Left hip arthritis: Continues to do well after intra-articular injection.  Past medical history, Surgical history, Family history not pertinant except as noted below, Social history, Allergies, and medications have been entered into the medical record, reviewed, and no changes needed.   Review of Systems: No headache, visual changes, nausea, vomiting, diarrhea, constipation, dizziness, abdominal pain, skin rash, fevers, chills, night sweats, weight loss, swollen lymph nodes, body aches, joint swelling, muscle aches, chest pain, shortness of breath, mood changes, visual or auditory hallucinations.   Objective:   General: Well Developed, well nourished, and in no acute distress.  Neuro/Psych: Alert and oriented x3, extra-ocular muscles intact, able to move all 4 extremities, sensation grossly intact. Skin: Warm and dry, no rashes noted.  Respiratory: Not using accessory muscles, speaking in full sentences, trachea midline.  Cardiovascular: Pulses palpable, no extremity edema. Abdomen: Does not appear distended.  We reviewed the MRI of his lumbar spine together. There is multilevel degenerative disc disease with multilevel spinal stenosis. Degenerative disc disease is worse at the L5-S1 level with a left-sided far lateral as well as foraminal disc protrusion, at the L4-L5 level also with a far lateral as well as a foraminal disc protrusion on the left side, as well as at the L3-L4 level again with a far lateral as well as foraminal disc protrusion. There is moderate to severe spinal stenosis at the above levels.  The facets also show moderate degenerative changes at the above levels. Impression and Recommendations:   This case required medical decision making of moderate complexity.

## 2013-02-07 DIAGNOSIS — S335XXA Sprain of ligaments of lumbar spine, initial encounter: Secondary | ICD-10-CM | POA: Diagnosis not present

## 2013-02-07 DIAGNOSIS — M999 Biomechanical lesion, unspecified: Secondary | ICD-10-CM | POA: Diagnosis not present

## 2013-02-09 ENCOUNTER — Ambulatory Visit
Admission: RE | Admit: 2013-02-09 | Discharge: 2013-02-09 | Disposition: A | Discharge status: Home / Self Care | Payer: Medicare Other | Source: Ambulatory Visit | Attending: Sports Medicine | Admitting: Sports Medicine

## 2013-02-09 VITALS — BP 160/78 | HR 62

## 2013-02-09 DIAGNOSIS — M545 Low back pain, unspecified: Secondary | ICD-10-CM

## 2013-02-09 DIAGNOSIS — M47817 Spondylosis without myelopathy or radiculopathy, lumbosacral region: Secondary | ICD-10-CM | POA: Diagnosis not present

## 2013-02-09 DIAGNOSIS — M5126 Other intervertebral disc displacement, lumbar region: Secondary | ICD-10-CM | POA: Diagnosis not present

## 2013-02-09 MED ORDER — METHYLPREDNISOLONE ACETATE 40 MG/ML INJ SUSP (RADIOLOG
120.0000 mg | Freq: Once | INTRAMUSCULAR | Status: AC
  Administered 2013-02-09: 120 mg via EPIDURAL

## 2013-02-09 MED ORDER — IOHEXOL 180 MG/ML  SOLN
1.0000 mL | Freq: Once | INTRAMUSCULAR | Status: AC | PRN
  Administered 2013-02-09: 1 mL via EPIDURAL

## 2013-02-13 ENCOUNTER — Other Ambulatory Visit: Payer: Self-pay | Admitting: *Deleted

## 2013-02-13 DIAGNOSIS — R0602 Shortness of breath: Secondary | ICD-10-CM | POA: Diagnosis not present

## 2013-02-13 DIAGNOSIS — I2510 Atherosclerotic heart disease of native coronary artery without angina pectoris: Secondary | ICD-10-CM | POA: Diagnosis not present

## 2013-02-13 DIAGNOSIS — I4949 Other premature depolarization: Secondary | ICD-10-CM | POA: Diagnosis not present

## 2013-02-13 DIAGNOSIS — I059 Rheumatic mitral valve disease, unspecified: Secondary | ICD-10-CM | POA: Diagnosis not present

## 2013-02-13 DIAGNOSIS — E039 Hypothyroidism, unspecified: Secondary | ICD-10-CM

## 2013-02-13 DIAGNOSIS — G459 Transient cerebral ischemic attack, unspecified: Secondary | ICD-10-CM | POA: Diagnosis not present

## 2013-02-13 DIAGNOSIS — R9439 Abnormal result of other cardiovascular function study: Secondary | ICD-10-CM | POA: Diagnosis not present

## 2013-02-13 MED ORDER — LEVOTHYROXINE SODIUM 75 MCG PO TABS: 75.0000 ug | ORAL_TABLET | Freq: Every day | ORAL | Status: DC

## 2013-02-17 ENCOUNTER — Ambulatory Visit (INDEPENDENT_AMBULATORY_CARE_PROVIDER_SITE_OTHER): Payer: Medicare Other | Admitting: Family Medicine

## 2013-02-17 ENCOUNTER — Encounter: Payer: Self-pay | Admitting: Family Medicine

## 2013-02-17 VITALS — BP 129/63 | HR 71 | Wt 203.0 lb

## 2013-02-17 DIAGNOSIS — J040 Acute laryngitis: Secondary | ICD-10-CM

## 2013-02-17 NOTE — Patient Instructions (Addendum)
We'll call you with referral information for ENT. For further evaluation of your laryngitis.

## 2013-02-17 NOTE — Progress Notes (Signed)
  Subjective:    Patient ID: [name], male    DOB: 04/24/1941, 72 y.o.   MRN: [id number]  HPI Says has been raspy but no ST for weeks to months.  Says has had this on and off. Had it last year as well. Says it is like the volume voice is "turned dow" . No problems swallowing. No fever.  Hx of polyp of vocal cord in 1969, but sxs are little different.occ nausea.  No vomiting.  No GERD or excessive bleching. No alleviating or worsening sxs.  Voice sounds clearer in the morning.     Review of Systems     Objective:   Physical Exam  Constitutional: He is oriented to person, place, and time. He appears well-developed and well-nourished.  HENT:  Head: Normocephalic and atraumatic.  Right Ear: External ear normal.  Left Ear: External ear normal.  Nose: Nose normal.  Mouth/Throat: Oropharynx is clear and moist.  TMs and canals are clear.   Eyes: Conjunctivae and EOM are normal. Pupils are equal, round, and reactive to light.  Neck: Neck supple. No thyromegaly present.  Cardiovascular: Normal rate and normal heart sounds.   Pulmonary/Chest: Effort normal and breath sounds normal.  Lymphadenopathy:    He has no cervical adenopathy.  Neurological: He is alert and oriented to person, place, and time.  Skin: Skin is warm and dry.  Psychiatric: He has a normal mood and affect.          Assessment & Plan:  [name] says his been having pounds with the volume and clarity of his voice. Reflux is less likely since the artery takes a PPI and has not had an increase or change and reflux-type symptoms. No other signs of infection today. No swollen lymph nodes in the neck. Not having any actual dysphagia. At this point I think it ENT referral as appropriate so that he can be scoped actually look at the vocal cords. Certainly he could have another polyp if he has had a history of this or maybe even scar tissue that could be causing a problem. Or possibly silent reflux. We'll make referral.  Patient okay with care plan.

## 2013-02-22 DIAGNOSIS — S335XXA Sprain of ligaments of lumbar spine, initial encounter: Secondary | ICD-10-CM | POA: Diagnosis not present

## 2013-02-22 DIAGNOSIS — M999 Biomechanical lesion, unspecified: Secondary | ICD-10-CM | POA: Diagnosis not present

## 2013-03-08 DIAGNOSIS — S335XXA Sprain of ligaments of lumbar spine, initial encounter: Secondary | ICD-10-CM | POA: Diagnosis not present

## 2013-03-08 DIAGNOSIS — M999 Biomechanical lesion, unspecified: Secondary | ICD-10-CM | POA: Diagnosis not present

## 2013-03-09 DIAGNOSIS — J383 Other diseases of vocal cords: Secondary | ICD-10-CM | POA: Diagnosis not present

## 2013-03-27 ENCOUNTER — Encounter (HOSPITAL_COMMUNITY): Payer: Self-pay | Admitting: Emergency Medicine

## 2013-03-27 ENCOUNTER — Emergency Department (HOSPITAL_COMMUNITY): Payer: Medicare Other

## 2013-03-27 ENCOUNTER — Inpatient Hospital Stay (HOSPITAL_COMMUNITY)
Admission: EM | Admit: 2013-03-27 | Discharge: 2013-03-29 | DRG: 690 | Disposition: A | Discharge status: Home / Self Care | Payer: Medicare Other | Attending: Internal Medicine | Admitting: Internal Medicine

## 2013-03-27 DIAGNOSIS — R1110 Vomiting, unspecified: Secondary | ICD-10-CM | POA: Diagnosis not present

## 2013-03-27 DIAGNOSIS — I479 Paroxysmal tachycardia, unspecified: Secondary | ICD-10-CM | POA: Diagnosis present

## 2013-03-27 DIAGNOSIS — N390 Urinary tract infection, site not specified: Secondary | ICD-10-CM | POA: Diagnosis not present

## 2013-03-27 DIAGNOSIS — K529 Noninfective gastroenteritis and colitis, unspecified: Secondary | ICD-10-CM | POA: Diagnosis present

## 2013-03-27 DIAGNOSIS — E039 Hypothyroidism, unspecified: Secondary | ICD-10-CM | POA: Diagnosis present

## 2013-03-27 DIAGNOSIS — I4891 Unspecified atrial fibrillation: Secondary | ICD-10-CM | POA: Diagnosis present

## 2013-03-27 DIAGNOSIS — J9819 Other pulmonary collapse: Secondary | ICD-10-CM | POA: Diagnosis not present

## 2013-03-27 DIAGNOSIS — R197 Diarrhea, unspecified: Secondary | ICD-10-CM | POA: Diagnosis not present

## 2013-03-27 DIAGNOSIS — D72829 Elevated white blood cell count, unspecified: Secondary | ICD-10-CM | POA: Diagnosis not present

## 2013-03-27 DIAGNOSIS — E7800 Pure hypercholesterolemia, unspecified: Secondary | ICD-10-CM | POA: Diagnosis present

## 2013-03-27 DIAGNOSIS — I480 Paroxysmal atrial fibrillation: Secondary | ICD-10-CM | POA: Diagnosis present

## 2013-03-27 DIAGNOSIS — R112 Nausea with vomiting, unspecified: Secondary | ICD-10-CM | POA: Diagnosis present

## 2013-03-27 DIAGNOSIS — R000 Tachycardia, unspecified: Secondary | ICD-10-CM | POA: Diagnosis present

## 2013-03-27 DIAGNOSIS — E876 Hypokalemia: Secondary | ICD-10-CM | POA: Diagnosis not present

## 2013-03-27 DIAGNOSIS — K5289 Other specified noninfective gastroenteritis and colitis: Secondary | ICD-10-CM | POA: Diagnosis present

## 2013-03-27 DIAGNOSIS — E871 Hypo-osmolality and hyponatremia: Secondary | ICD-10-CM | POA: Diagnosis not present

## 2013-03-27 LAB — COMPREHENSIVE METABOLIC PANEL
ALT: 18 U/L (ref 0–53)
Alkaline Phosphatase: 44 U/L (ref 39–117)
CO2: 24 mEq/L (ref 19–32)
Chloride: 104 mEq/L (ref 96–112)
GFR calc Af Amer: >90 mL/min (ref >90)
GFR calc non Af Amer: >90 mL/min (ref >90)
Glucose, Bld: 132 mg/dL — ABNORMAL HIGH (ref 70–99)
Potassium: 3.5 mEq/L (ref 3.5–5.1)
Sodium: 140 mEq/L (ref 135–145)
Total Bilirubin: 0.5 mg/dL (ref 0.3–1.2)
Total Protein: 6.3 g/dL (ref 6.0–8.3)

## 2013-03-27 LAB — URINALYSIS, ROUTINE W REFLEX MICROSCOPIC
Bilirubin Urine: NEGATIVE
Hgb urine dipstick: NEGATIVE
Specific Gravity, Urine: 1.029 (ref 1.005–1.030)
Urobilinogen, UA: 1 mg/dL (ref 0.0–1.0)

## 2013-03-27 LAB — CBC WITH DIFFERENTIAL/PLATELET
Eosinophils Absolute: 0 10*3/uL (ref 0.0–0.7)
Eosinophils Relative: 0 % (ref 0–5)
Lymphs Abs: 0.4 10*3/uL — ABNORMAL LOW (ref 0.7–4.0)
MCH: 29.9 pg (ref 26.0–34.0)
MCV: 87.5 fL (ref 78.0–100.0)
Monocytes Relative: 6 % (ref 3–12)
Platelets: 190 10*3/uL (ref 150–400)
RBC: 4.55 MIL/uL (ref 4.22–5.81)

## 2013-03-27 LAB — URINE MICROSCOPIC-ADD ON

## 2013-03-27 MED ORDER — SODIUM CHLORIDE 0.9 % IV SOLN
INTRAVENOUS | Status: DC
  Administered 2013-03-27: 22:00:00 via INTRAVENOUS

## 2013-03-27 MED ORDER — PANTOPRAZOLE SODIUM 40 MG IV SOLR
40.0000 mg | Freq: Every day | INTRAVENOUS | Status: DC
  Administered 2013-03-27 – 2013-03-28 (×2): 40 mg via INTRAVENOUS
  Filled 2013-03-27 (×3): qty 40

## 2013-03-27 MED ORDER — SODIUM CHLORIDE 0.9 % IV SOLN
1000.0000 mL | Freq: Once | INTRAVENOUS | Status: AC
  Administered 2013-03-27: 1000 mL via INTRAVENOUS

## 2013-03-27 MED ORDER — ONDANSETRON 8 MG/NS 50 ML IVPB
8.0000 mg | Freq: Once | INTRAVENOUS | Status: AC
  Administered 2013-03-27: 8 mg via INTRAVENOUS
  Filled 2013-03-27: qty 8

## 2013-03-27 MED ORDER — ONDANSETRON HCL 4 MG/2ML IJ SOLN: 4.0000 mg | Freq: Three times a day (TID) | INTRAMUSCULAR | Status: AC | PRN

## 2013-03-27 MED ORDER — PROMETHAZINE HCL 25 MG/ML IJ SOLN
12.5000 mg | INTRAMUSCULAR | Status: DC | PRN
Start: 2013-03-27 — End: 2013-03-29

## 2013-03-27 MED ORDER — ONDANSETRON HCL 4 MG/2ML IJ SOLN
4.0000 mg | Freq: Once | INTRAMUSCULAR | Status: AC
Start: 2013-03-27 — End: 2013-03-27
  Administered 2013-03-27: 4 mg via INTRAVENOUS
  Filled 2013-03-27: qty 2

## 2013-03-27 MED ORDER — SODIUM CHLORIDE 0.9 % IV SOLN
1000.0000 mL | INTRAVENOUS | Status: DC
  Administered 2013-03-27 (×2): 1000 mL via INTRAVENOUS

## 2013-03-27 MED ORDER — DEXTROSE 5 % IV SOLN
1.0000 g | Freq: Once | INTRAVENOUS | Status: AC
  Administered 2013-03-27: 1 g via INTRAVENOUS
  Filled 2013-03-27: qty 10

## 2013-03-27 MED ORDER — ENOXAPARIN SODIUM 40 MG/0.4ML ~~LOC~~ SOLN
40.0000 mg | SUBCUTANEOUS | Status: DC
  Administered 2013-03-27 – 2013-03-28 (×2): 40 mg via SUBCUTANEOUS
  Filled 2013-03-27 (×3): qty 0.4

## 2013-03-27 MED ORDER — METOPROLOL TARTRATE 50 MG PO TABS
50.0000 mg | ORAL_TABLET | Freq: Two times a day (BID) | ORAL | Status: DC
  Administered 2013-03-28 – 2013-03-29 (×2): 50 mg via ORAL
  Filled 2013-03-27 (×5): qty 1

## 2013-03-27 MED ORDER — HYDROCODONE-ACETAMINOPHEN 5-325 MG PO TABS: 1.0000 | ORAL_TABLET | ORAL | Status: DC | PRN

## 2013-03-27 NOTE — ED Notes (Signed)
Patient requested something to drink. MD J.Knapp approved. Patient given water.

## 2013-03-27 NOTE — ED Notes (Signed)
Report given to 3rd floor charge nurse. 

## 2013-03-27 NOTE — ED Notes (Signed)
Patient transported to X-ray 

## 2013-03-27 NOTE — ED Notes (Signed)
Patient up to bathroom to urinate.

## 2013-03-27 NOTE — ED Provider Notes (Signed)
Assumed care from Dr Roselyn Bering in sign out. Pt was feeling better but began to feel increasingly nauseated and vomiting again. Feels tired. Developed fever. UA consistent with UTI. Continues to deny abdominal pain and my abdominal exam is benign as well. Do not feel CT imaging indicated at this time. Continues symptomatic tx, abx and admit.   Raeford Razor, MD 03/27/13 1810

## 2013-03-27 NOTE — ED Notes (Signed)
Patient has been vomiting since last night around 2130.  Patient was seen a few minutes ago for the same.  Patient had one episode of diarrhea this morning.  Patient denies fevers or abdominal pain.

## 2013-03-27 NOTE — ED Notes (Signed)
Patient had second diarrhea stool.  MD notified.

## 2013-03-27 NOTE — ED Provider Notes (Signed)
History    CSN: [id number] Arrival date & time 03/27/13  1220 First MD Initiated Contact with Patient 03/27/13 1256      Chief Complaint  Patient presents with  . Emesis    Patient is a 72 y.o. male presenting with vomiting. The history is provided by the patient and the spouse.  Emesis Severity:  Severe Duration:  1 day Timing:  Constant Number of daily episodes:  10 Quality:  Stomach contents Progression:  Worsening Chronicity:  New Relieved by:  Nothing Associated symptoms: diarrhea (only one loose stool)   Associated symptoms: no abdominal pain, no fever and no headaches   Pt was seen at [facility] yesterday.  He was treated with IV fluids and released but the symptoms returned shortly after leaving.  Past Medical History  Diagnosis Date  . Insulin resistance   . LV dysfunction     reduction  . Osteoarthritis   . Hearing aid worn   . Abdominal wall defect, acquired   . Blockage of coronary artery of heart     30%    Past Surgical History  Procedure Laterality Date  . Acne cyst removal  2012    Back   . Appendectomy    . Tonsillectomy    . Cardiac catheterization    . Rt hip replacement      Family History  Problem Relation Age of Onset  . Heart disease Mother     pacemaker    History  Substance Use Topics  . Smoking status: Former Smoker    Quit date: 12/21/1985  . Smokeless tobacco: Not on file  . Alcohol Use: No      Review of Systems  Gastrointestinal: Positive for vomiting and diarrhea (only one loose stool). Negative for abdominal pain.  Neurological: Negative for seizures, syncope, speech difficulty, weakness, light-headedness, numbness and headaches.  All other systems reviewed and are negative.    Allergies  Review of patient's allergies indicates no known allergies.  Home Medications   Current Outpatient Rx  Name  Route  Sig  Dispense  Refill  . levothyroxine (SYNTHROID, LEVOTHROID) 75 MCG tablet   Oral   Take 1  tablet (75 mcg total) by mouth daily.   90 tablet   1   . meloxicam (MOBIC) 15 MG tablet   Oral   Take 1 tablet (15 mg total) by mouth daily as needed.   90 tablet   3   . metoprolol (LOPRESSOR) 50 MG tablet   Oral   Take 1 tablet (50 mg total) by mouth 2 (two) times daily.         . Multiple Vitamin (MULTIVITAMIN) tablet   Oral   Take 1 tablet by mouth daily.           [name] omeprazole (PRILOSEC) 20 MG capsule   Oral   Take 20 mg by mouth daily.           . ondansetron (ZOFRAN-ODT) 4 MG disintegrating tablet   Oral   Take 4 mg by mouth every 8 (eight) hours as needed for nausea.         . promethazine (PHENERGAN) 25 MG suppository   Rectal   Place 25 mg rectally every 6 (six) hours as needed for nausea.         . simvastatin (ZOCOR) 40 MG tablet   Oral   Take 40 mg by mouth every evening.           BP 109/60  Pulse  70  Temp(Src) 99.6 F (37.6 C) (Oral)  Resp 16  SpO2 100%  Physical Exam  Nursing note and vitals reviewed. Constitutional: He appears well-developed and well-nourished. No distress.  HENT:  Head: Normocephalic and atraumatic.  Right Ear: External ear normal.  Left Ear: External ear normal.  Eyes: Conjunctivae are normal. Right eye exhibits no discharge. Left eye exhibits no discharge. No scleral icterus.  Neck: Neck supple. No tracheal deviation present.  Cardiovascular: Normal rate, regular rhythm and intact distal pulses.   Pulmonary/Chest: Effort normal and breath sounds normal. No stridor. No respiratory distress. He has no wheezes. He has no rales.  Abdominal: Soft. Bowel sounds are normal. He exhibits no distension. There is no tenderness. There is no rebound and no guarding.  Musculoskeletal: He exhibits no edema and no tenderness.  Neurological: He is alert. He has normal strength. No sensory deficit. Cranial nerve deficit:  no gross defecits noted. He exhibits normal muscle tone. He displays no seizure activity. Coordination  normal.  Skin: Skin is warm and dry. No rash noted.  Psychiatric: He has a normal mood and affect.    ED Course  Procedures (including critical care time)  Labs Reviewed  COMPREHENSIVE METABOLIC PANEL - Abnormal; Notable for the following:    Glucose, Bld 132 (*)    Calcium 8.3 (*)    All other components within normal limits  CBC WITH DIFFERENTIAL - Abnormal; Notable for the following:    Neutrophils Relative 88 (*)    Lymphocytes Relative 6 (*)    Lymphs Abs 0.4 (*)    All other components within normal limits  LIPASE, BLOOD  URINALYSIS, ROUTINE W REFLEX MICROSCOPIC   Dg Abd Acute W/chest  03/27/2013  *RADIOLOGY REPORT*  Clinical Data: Emesis.  Diarrhea.  ACUTE ABDOMEN SERIES (ABDOMEN 2 VIEW & CHEST 1 VIEW)  Comparison: Chest x-ray 12/27/2012  Findings: Heart is enlarged.  The lungs are free of focal consolidations and pleural effusions.  There is minimal left lower lobe atelectasis.  No free intraperitoneal air.  Supine and erect views of the abdomen show a nonobstructive bowel gas pattern.  No organomegaly.  No abnormal calcifications.  There are degenerative changes in the left hip.  Status post right hip arthroplasty.  IMPRESSION: 1.  Cardiomegaly and left lower lobe atelectasis.  2.  Nonobstructive bowel gas pattern   Original Report Authenticated By: Norva Pavlov, M.D.         MDM  On repeat exam the patient continues to have no abdominal tenderness. He's feeling somewhat better with IV fluids. He is tolerating oral fluids at this time. Continue with IV hydration and check a urine sample to ensure that the patient is able to urinate.  Suspect possible viral illness.  Dr Juleen China will reassess once the UA has resulted.        Celene Kras, MD 03/27/13 [phone]

## 2013-03-27 NOTE — H&P (Signed)
History and Physical  [name] WUJ:[id number] DOB: Dec 08, 1941 DOA: 03/27/2013  Referring physician: Dr. Juleen China (ER) PCP: Nani Gasser, MD   Chief Complaint: Nausea and vomiting  HPI: Patient is a 72 year old man with past medical history most significant for insulin dependent diabetes, congestive heart failure, osteoarthritis and required abdominal wall defect who comes in with chief complaints of nausea and vomiting since yesterday night. Patient was seen at an outside ER and was treated with IV fluids, Zofran and Phenergan. Patient was sent back home when his symptoms resolved but recurred shortly after reaching home. Patient presented to [facility] ER earlier this morning with similar complaints. Vomiting about 10 episodes since yesterday. The vomitus contained food particles and yellowish fluid but no blood noted. Patient denies any abdominal pain, fevers, headaches, chills. He also had one episode of loose bowel movement this morning.  Work up in ER was suggestive of urinary tract infection and tachycardia. Patient also noted to have fever in ER.  Hospitalist team was asked to admit the patient for supportive treatment and antibiotics.  Review of Systems:  15 point review of system is negative except what is noted above  Past Medical History  Diagnosis Date  . Insulin resistance   . LV dysfunction     reduction  . Osteoarthritis   . Hearing aid worn   . Abdominal wall defect, acquired   . Blockage of coronary artery of heart     30%    Past Surgical History  Procedure Laterality Date  . Acne cyst removal  2012    Back   . Appendectomy    . Tonsillectomy    . Cardiac catheterization    . Rt hip replacement      Social History:  reports that he quit smoking about 27 years ago. He does not have any smokeless tobacco history on file. He reports that he does not drink alcohol or use illicit drugs.  No Known Allergies  Family History  Problem Relation Age of Onset   . Heart disease Mother     pacemaker     Prior to Admission medications   Medication Sig Start Date End Date Taking? Authorizing Provider  levothyroxine (SYNTHROID, LEVOTHROID) 75 MCG tablet Take 1 tablet (75 mcg total) by mouth daily. 02/13/13  Yes Agapito Games, MD  meloxicam (MOBIC) 15 MG tablet Take 1 tablet (15 mg total) by mouth daily as needed. 12/05/12  Yes Monica Becton, MD  metoprolol (LOPRESSOR) 50 MG tablet Take 1 tablet (50 mg total) by mouth 2 (two) times daily. 12/05/12  Yes Monica Becton, MD  Multiple Vitamin (MULTIVITAMIN) tablet Take 1 tablet by mouth daily.     Yes Historical Provider, MD  omeprazole (PRILOSEC) 20 MG capsule Take 20 mg by mouth daily.     Yes Historical Provider, MD  ondansetron (ZOFRAN-ODT) 4 MG disintegrating tablet Take 4 mg by mouth every 8 (eight) hours as needed for nausea.   Yes Historical Provider, MD  promethazine (PHENERGAN) 25 MG suppository Place 25 mg rectally every 6 (six) hours as needed for nausea.   Yes Historical Provider, MD  simvastatin (ZOCOR) 40 MG tablet Take 40 mg by mouth every evening.   Yes Historical Provider, MD   Physical Exam: Filed Vitals:   03/27/13 1234 03/27/13 1743  BP: 109/60 119/72  Pulse: 70 101  Temp: 99.6 F (37.6 C) 100.3 F (37.9 C)  TempSrc: Oral Oral  Resp: 16 20  SpO2: 100% 95%   Physical  Exam: General: Vital signs reviewed and noted. Well-developed, well-nourished, in no acute distress; alert, appropriate and cooperative throughout examination.  Head: Normocephalic, atraumatic.  Eyes: PERRL, EOMI, No signs of anemia or jaundince.  Nose: Mucous membranes moist, not inflammed, nonerythematous.  Throat: Oropharynx nonerythematous, no exudate appreciated.   Neck: No deformities, masses, or tenderness noted.Supple, No carotid Bruits, no JVD.  Lungs:  Normal respiratory effort. Clear to auscultation BL without crackles or wheezes.  Heart: RRR. S1 and S2 normal without gallop,  murmur, or rubs.  Abdomen:  BS normoactive. Soft, Nondistended, non-tender.  No masses or organomegaly.  Extremities: No pretibial edema.  Neurologic: A&O X3, CN II - XII are grossly intact. Motor strength is 5/5 in the all 4 extremities, Sensations intact to light touch, Cerebellar signs negative.  Skin: No visible rashes, scars.    Wt Readings from Last 3 Encounters:  02/17/13 203 lb (92.08 kg)  12/27/12 204 lb (92.534 kg)  12/08/12 208 lb (94.348 kg)    Labs on Admission:  Basic Metabolic Panel:  Recent Labs Lab 03/27/13 1315  NA 140  K 3.5  CL 104  CO2 24  GLUCOSE 132*  BUN 22  CREATININE 0.73  CALCIUM 8.3*    Liver Function Tests:  Recent Labs Lab 03/27/13 1315  AST 22  ALT 18  ALKPHOS 44  BILITOT 0.5  PROT 6.3  ALBUMIN 3.5    Recent Labs Lab 03/27/13 1315  LIPASE 34   CBC:  Recent Labs Lab 03/27/13 1315  WBC 7.0  NEUTROABS 6.2  HGB 13.6  HCT 39.8  MCV 87.5  PLT 190    Radiological Exams on Admission: Dg Abd Acute W/chest  03/27/2013  *RADIOLOGY REPORT*  Clinical Data: Emesis.  Diarrhea.  ACUTE ABDOMEN SERIES (ABDOMEN 2 VIEW & CHEST 1 VIEW)  Comparison: Chest x-ray 12/27/2012  Findings: Heart is enlarged.  The lungs are free of focal consolidations and pleural effusions.  There is minimal left lower lobe atelectasis.  No free intraperitoneal air.  Supine and erect views of the abdomen show a nonobstructive bowel gas pattern.  No organomegaly.  No abnormal calcifications.  There are degenerative changes in the left hip.  Status post right hip arthroplasty.  IMPRESSION: 1.  Cardiomegaly and left lower lobe atelectasis.  2.  Nonobstructive bowel gas pattern   Original Report Authenticated By: Norva Pavlov, M.D.      Principal Problem:   Nausea and vomiting in adult patient Active Problems:   Urinary tract infection   HYPOTHYROIDISM NOS   HYPERCHOLESTEROLEMIA   TACHYCARDIA   Paroxysmal atrial fibrillation   Assessment/Plan 72 year old  fairly healthy man admitted with nausea and vomiting for one day and also found to have a urinary tract infection in the ER today is currently being admitted for what seems to be an episode of viral gastroenteritis.  -Admit to MedSurg bed -IV fluids and 125 cc an hour normal saline -N.p.o. for now except meds -Supportive management with Phenergan -Pain control with Vicodin as needed -No indication for antibiotics at this time -Hold levothyroxin and statin and multivitamin -Continue metoprolol -Continue Protonix IV(patient on omeprazole at home) -Continue ceftriaxone for urinary tract infection -Follow urine culture results and change antibiotics based on the sensitivity    Code Status: Full code Family Communication: Updated at bedside Disposition Plan/Anticipated LOS: Home when stable  Time spent: 50 minutes  Lars Mage, MD  [facility] Team 8  If 7PM-7AM, please contact night-coverage at [email or web address], password [facility] 03/27/2013, 6:28 PM

## 2013-03-27 NOTE — ED Notes (Signed)
Ginger ale offered for fluid challenge  

## 2013-03-27 NOTE — ED Notes (Signed)
Patient given urinal and advised need sample.  

## 2013-03-28 DIAGNOSIS — E876 Hypokalemia: Secondary | ICD-10-CM | POA: Diagnosis not present

## 2013-03-28 DIAGNOSIS — E039 Hypothyroidism, unspecified: Secondary | ICD-10-CM | POA: Diagnosis not present

## 2013-03-28 DIAGNOSIS — K529 Noninfective gastroenteritis and colitis, unspecified: Secondary | ICD-10-CM | POA: Diagnosis present

## 2013-03-28 DIAGNOSIS — K5289 Other specified noninfective gastroenteritis and colitis: Secondary | ICD-10-CM

## 2013-03-28 DIAGNOSIS — E871 Hypo-osmolality and hyponatremia: Secondary | ICD-10-CM | POA: Diagnosis not present

## 2013-03-28 DIAGNOSIS — N390 Urinary tract infection, site not specified: Secondary | ICD-10-CM | POA: Diagnosis not present

## 2013-03-28 LAB — CBC WITH DIFFERENTIAL/PLATELET
Eosinophils Relative: 0 % (ref 0–5)
HCT: 35 % — ABNORMAL LOW (ref 39.0–52.0)
Hemoglobin: 12 g/dL — ABNORMAL LOW (ref 13.0–17.0)
Lymphocytes Relative: 14 % (ref 12–46)
MCV: 88.4 fL (ref 78.0–100.0)
Monocytes Absolute: 0.7 10*3/uL (ref 0.1–1.0)
Monocytes Relative: 14 % — ABNORMAL HIGH (ref 3–12)
Neutro Abs: 3.5 10*3/uL (ref 1.7–7.7)
WBC: 4.8 10*3/uL (ref 4.0–10.5)

## 2013-03-28 LAB — COMPREHENSIVE METABOLIC PANEL
ALT: 14 U/L (ref 0–53)
Albumin: 2.7 g/dL — ABNORMAL LOW (ref 3.5–5.2)
Alkaline Phosphatase: 31 U/L — ABNORMAL LOW (ref 39–117)
BUN: 20 mg/dL (ref 6–23)
Calcium: 7.6 mg/dL — ABNORMAL LOW (ref 8.4–10.5)
GFR calc Af Amer: >90 mL/min (ref >90)
Glucose, Bld: 122 mg/dL — ABNORMAL HIGH (ref 70–99)
Potassium: 3.2 mEq/L — ABNORMAL LOW (ref 3.5–5.1)
Sodium: 138 mEq/L (ref 135–145)
Total Protein: 5 g/dL — ABNORMAL LOW (ref 6.0–8.3)

## 2013-03-28 MED ORDER — LEVOTHYROXINE SODIUM 75 MCG PO TABS
75.0000 ug | ORAL_TABLET | Freq: Every day | ORAL | Status: DC
  Administered 2013-03-29: 75 ug via ORAL
  Filled 2013-03-28 (×2): qty 1

## 2013-03-28 MED ORDER — POTASSIUM CHLORIDE IN NACL 40-0.9 MEQ/L-% IV SOLN
INTRAVENOUS | Status: DC
  Administered 2013-03-28 – 2013-03-29 (×3): via INTRAVENOUS
  Filled 2013-03-28 (×3): qty 1000

## 2013-03-28 MED ORDER — POTASSIUM CHLORIDE CRYS ER 20 MEQ PO TBCR
20.0000 meq | EXTENDED_RELEASE_TABLET | Freq: Every day | ORAL | Status: AC
  Administered 2013-03-28 – 2013-03-29 (×2): 20 meq via ORAL
  Filled 2013-03-28 (×2): qty 1

## 2013-03-28 NOTE — Care Management Note (Signed)
CARE MANAGEMENT NOTE 03/28/2013  Patient:  [name], [name]   Account Number:  [id number]  Date Initiated:  03/28/2013  Documentation initiated by:  Magenta Schmiesing  Subjective/Objective Assessment:   72 yo male admitted with N/V & UTI.     Action/Plan:   Home when stable   Anticipated DC Date:     Anticipated DC Plan:  HOME/SELF CARE         Choice offered to / List presented to:             Status of service:  In process, will continue to follow Medicare Important Message given?   (If response is "NO", the following Medicare IM given date fields will be blank) Date Medicare IM given:   Date Additional Medicare IM given:    Discharge Disposition:    Per UR Regulation:  Reviewed for med. necessity/level of care/duration of stay  If discussed at Long Length of Stay Meetings, dates discussed:    Comments:  03/28/13 1425 Sheletha Bow,RN,BSN [phone] Cm will follow for any d/c needs. Pt independent PTA.

## 2013-03-28 NOTE — Progress Notes (Signed)
Subjective: Patient says that he feels better. He tolerated clear liquids for lunch. A little nausea, but no vomiting. No recent diarrhea. No abdominal pain.  Objective: Vital signs in last 24 hours: Filed Vitals:   03/27/13 1743 03/27/13 1938 03/27/13 2203 03/28/13 0627  BP: 119/72 123/59 123/61 118/54  Pulse: 101 99 105 91  Temp: 100.3 F (37.9 C) 100.1 F (37.8 C) 98.1 F (36.7 C) 98.8 F (37.1 C)  TempSrc: Oral Oral Oral Oral  Resp: 20 18 16 16   Height:  5\' 11"  (1.803 m)    Weight:  93.8 kg (206 lb 12.7 oz)    SpO2: 95% 96% 94% 97%    Intake/Output Summary (Last 24 hours) at 03/28/13 1335 Last data filed at 03/28/13 1000  Gross per 24 hour  Intake    240 ml  Output    500 ml  Net   -260 ml    Weight change:   Physical exam: General: Alert [name] sitting up in a chair, in no acute distress. Lungs: Clear to auscultation bilaterally. Heart: S1, S2, with no murmurs rubs or gallops. Abdomen: Mildly obese, positive bowel sounds, soft, nontender, nondistended. Extremities: No pedal edema. Neurologic: He is alert and oriented x3. Cranial nerves II through XII are intact.   Lab Results: Basic Metabolic Panel:  Recent Labs  16/10/96 1315 03/28/13 0419  NA 140 138  K 3.5 3.2*  CL 104 107  CO2 24 24  GLUCOSE 132* 122*  BUN 22 20  CREATININE 0.73 0.80  CALCIUM 8.3* 7.6*   Liver Function Tests:  Recent Labs  03/27/13 1315 03/28/13 0419  AST 22 19  ALT 18 14  ALKPHOS 44 31*  BILITOT 0.5 0.3  PROT 6.3 5.0*  ALBUMIN 3.5 2.7*    Recent Labs  03/27/13 1315  LIPASE 34   No results found for this basename: AMMONIA,  in the last 72 hours CBC:  Recent Labs  03/27/13 1315 03/28/13 0419  WBC 7.0 4.8  NEUTROABS 6.2 3.5  HGB 13.6 12.0*  HCT 39.8 35.0*  MCV 87.5 88.4  PLT 190 159   Cardiac Enzymes: No results found for this basename: CKTOTAL, CKMB, CKMBINDEX, TROPONINI,  in the last 72 hours BNP: No results found for this  basename: PROBNP,  in the last 72 hours D-Dimer: No results found for this basename: DDIMER,  in the last 72 hours CBG: No results found for this basename: GLUCAP,  in the last 72 hours Hemoglobin A1C: No results found for this basename: HGBA1C,  in the last 72 hours Fasting Lipid Panel: No results found for this basename: CHOL, HDL, LDLCALC, TRIG, CHOLHDL, LDLDIRECT,  in the last 72 hours Thyroid Function Tests: No results found for this basename: TSH, T4TOTAL, FREET4, T3FREE, THYROIDAB,  in the last 72 hours Anemia Panel: No results found for this basename: VITAMINB12, FOLATE, FERRITIN, TIBC, IRON, RETICCTPCT,  in the last 72 hours Coagulation: No results found for this basename: LABPROT, INR,  in the last 72 hours Urine Drug Screen: Drugs of Abuse  No results found for this basename: labopia,  cocainscrnur,  labbenz,  amphetmu,  thcu,  labbarb    Alcohol Level: No results found for this basename: ETH,  in the last 72 hours Urinalysis:  Recent Labs  03/27/13 1645  COLORURINE YELLOW  LABSPEC 1.029  PHURINE 5.5  GLUCOSEU NEGATIVE  HGBUR NEGATIVE  BILIRUBINUR NEGATIVE  KETONESUR >80*  PROTEINUR NEGATIVE  UROBILINOGEN 1.0  NITRITE POSITIVE*  LEUKOCYTESUR NEGATIVE  Misc. Labs:   Micro: No results found for this or any previous visit (from the past 240 hour(s)).  Studies/Results: Dg Abd Acute W/chest  03/27/2013  *RADIOLOGY REPORT*  Clinical Data: Emesis.  Diarrhea.  ACUTE ABDOMEN SERIES (ABDOMEN 2 VIEW & CHEST 1 VIEW)  Comparison: Chest x-ray 12/27/2012  Findings: Heart is enlarged.  The lungs are free of focal consolidations and pleural effusions.  There is minimal left lower lobe atelectasis.  No free intraperitoneal air.  Supine and erect views of the abdomen show a nonobstructive bowel gas pattern.  No organomegaly.  No abnormal calcifications.  There are degenerative changes in the left hip.  Status post right hip arthroplasty.  IMPRESSION: 1.  Cardiomegaly and left  lower lobe atelectasis.  2.  Nonobstructive bowel gas pattern   Original Report Authenticated By: Norva Pavlov, M.D.     Medications:  Scheduled: . enoxaparin (LOVENOX) injection  40 mg Subcutaneous Q24H  . metoprolol  50 mg Oral BID  . pantoprazole (PROTONIX) IV  40 mg Intravenous QHS   Continuous: . sodium chloride 75 mL/hr at 03/27/13 2140  . 0.9 % NaCl with KCl 40 mEq / L 70 mL/hr at 03/28/13 1610   RUE:[id number], promethazine  Assessment: Principal Problem:   Nausea and vomiting in adult patient Active Problems:   HYPOTHYROIDISM NOS   HYPERCHOLESTEROLEMIA   TACHYCARDIA   Paroxysmal atrial fibrillation   Urinary tract infection   Acute gastroenteritis   Hypokalemia   1. Nausea vomiting likely secondary to urinary tract infection and/or acute gastroenteritis. He is improving clinically. We'll continue as needed antiemetics. Continue PPI.  Urinary tract infection. Rocephin was given in the emergency department. This will be restarted. Urine culture pending.  Low-normal blood pressures with a history of hypertension. We'll continue metoprolol, but will add parameters to hold it for relative hypotension.  Paroxysmal atrial fibrillation. Currently in sinus rhythm by my exam. Rate is within normal limits.  Hypothyroidism. Will restart Synthroid.  Hypokalemia. Likely secondary to vomiting and IV fluids overnight.   Plan:  1. We'll add potassium to the IV fluids. We'll give gentle potassium chloride orally as tolerated. 2. We'll advance his diet to a full liquid diet. If he tolerates it, advance as tolerated further. 3. Restart Synthroid. 4. Hold metoprolol for systolic blood pressures less than 100. 5. Consider discharge in the next 24-48 hours.   LOS: 1 day   [name] 03/28/2013, 1:35 PM

## 2013-03-29 DIAGNOSIS — N390 Urinary tract infection, site not specified: Secondary | ICD-10-CM | POA: Diagnosis not present

## 2013-03-29 DIAGNOSIS — E039 Hypothyroidism, unspecified: Secondary | ICD-10-CM | POA: Diagnosis not present

## 2013-03-29 DIAGNOSIS — K5289 Other specified noninfective gastroenteritis and colitis: Secondary | ICD-10-CM | POA: Diagnosis not present

## 2013-03-29 LAB — URINE CULTURE: Colony Count: >=100000

## 2013-03-29 LAB — BASIC METABOLIC PANEL
BUN: 10 mg/dL (ref 6–23)
CO2: 26 mEq/L (ref 19–32)
Calcium: 7.9 mg/dL — ABNORMAL LOW (ref 8.4–10.5)
Chloride: 107 mEq/L (ref 96–112)
Creatinine, Ser: 0.66 mg/dL (ref 0.50–1.35)
Glucose, Bld: 107 mg/dL — ABNORMAL HIGH (ref 70–99)

## 2013-03-29 LAB — CBC
HCT: 35.2 % — ABNORMAL LOW (ref 39.0–52.0)
MCH: 30 pg (ref 26.0–34.0)
MCHC: 33.8 g/dL (ref 30.0–36.0)
MCV: 88.7 fL (ref 78.0–100.0)
RDW: 14.4 % (ref 11.5–15.5)

## 2013-03-29 MED ORDER — PANTOPRAZOLE SODIUM 40 MG PO TBEC
40.0000 mg | DELAYED_RELEASE_TABLET | Freq: Every day | ORAL | Status: DC
  Administered 2013-03-29: 40 mg via ORAL
  Filled 2013-03-29: qty 1

## 2013-03-29 MED ORDER — CEPHALEXIN 500 MG PO CAPS: 500.0000 mg | ORAL_CAPSULE | Freq: Three times a day (TID) | ORAL | Status: DC

## 2013-03-29 MED ORDER — CEPHALEXIN 500 MG PO CAPS
500.0000 mg | ORAL_CAPSULE | Freq: Three times a day (TID) | ORAL | Status: DC
  Administered 2013-03-29: 500 mg via ORAL
  Filled 2013-03-29 (×3): qty 1

## 2013-03-29 NOTE — Progress Notes (Signed)
Patient d/c home at 1528, stable, wife present on d/c.- Hulda Marin RN

## 2013-03-29 NOTE — Progress Notes (Signed)
Pharmacy: IV to PO Protonix  Patient has been receiving IV Protonix. Per Pharmacy and Therapeutics Committee, this patient meets criteria for auto conversion to PO Protonix:   Tolerating a diet of full liquids or better  Tolerating other medications via enteral route  No GIB  Teighlor Korson PharmD  [phone] 01/18/2012 8:54 AM    

## 2013-03-29 NOTE — Progress Notes (Signed)
D/c instructions given to patient, verbalized understanding, no c/o pain, tolerated his diet,prescription given too. Stable. Hulda Marin RN

## 2013-03-29 NOTE — Care Management (Signed)
CM spoke with patient concerning discharge planning. Pt states independent PTA. No HH services or DME needs assessed or requested. Pt dc plan includes discharging home with spouse assisting in home care. Pt states using [facility] in [place]. PCP: METHENEY,CATHERINE, MD. CM to sign off.   Roxy Manns Jet Traynham,RN,BSN [phone]

## 2013-03-29 NOTE — Discharge Summary (Addendum)
[facility]  Physician Discharge Summary   Patient ID: [name] MRN: [id number] DOB/AGE: February 17, 1941 72 y.o.  Admit date: 03/27/2013 Discharge date: 03/29/2013  PCP: METHENEY,CATHERINE, MD  DISCHARGE DIAGNOSES:  Principal Problem:   Nausea and vomiting in adult patient Active Problems:   HYPOTHYROIDISM NOS   HYPERCHOLESTEROLEMIA   TACHYCARDIA   Paroxysmal atrial fibrillation   Urinary tract infection   Acute gastroenteritis   Hypokalemia   RECOMMENDATIONS FOR OUTPATIENT FOLLOW UP: 1. Follow up as needed  DISCHARGE CONDITION: fair  Diet recommendation: Low Sodium  Filed Weights   03/27/13 1938  Weight: 93.8 kg (206 lb 12.7 oz)    INITIAL HISTORY: Patient is a 72 year old man who presented with chief complaints of nausea and vomiting since night before admission. Patient was seen at another ER and was treated with IV fluids, Zofran and Phenergan. Patient was sent back home when his symptoms resolved but recurred shortly after reaching home. Patient then presented to [facility] long ER with similar complaints. Vomited about 10 times since onset. The vomitus contained food particles and yellowish fluid but no blood noted. Patient denied any abdominal pain, fevers, headaches, chills. He also had a few episodes of loose stool. Work up in ER was suggestive of urinary tract infection and tachycardia. Patient also noted to have fever in ER. Hospitalist team was asked to admit the patient for supportive treatment and antibiotics.  Consultations:  None  Procedures:  None  HOSPITAL COURSE:   Nausea vomiting likely secondary to urinary tract infection and/or acute gastroenteritis He is much better. Tolerating diet. No episodes of vomiting. He reports that his wife had a few episodes of vomiting last night. This suggests a food borne illness.   Abnormal UA/Urinary tract infection Rocephin was given in the emergency department but not continued. Urine culture is pending.  Will treat with Keflex. Will follow up on culture reports.   History of hypertension Bp was initially low but then stabilized. Now normal. Ambulating with no difficulties.   History of Paroxysmal atrial fibrillation Currently in sinus rhythm by my exam. Rate is within normal limits. Continue BB. On ASA 325mg  at home. Likely had low CHADS2 score.   History of Hypothyroidism Continue Synthroid.   Hypokalemia Likely secondary to vomiting and IV fluids overnight. Now resolved with repletion.  Patient feels well. Denies any new complaints. Eager to go home. I will follow up on urine culture.   PERTINENT LABS:  The results of significant diagnostics from this hospitalization (including imaging, microbiology, ancillary and laboratory) are listed below for reference.    Microbiology: Recent Results (from the past 240 hour(s))  URINE CULTURE     Status: None   Collection Time    03/27/13  5:50 PM      Result Value Range Status   Specimen Description URINE, CLEAN CATCH   Final   Special Requests NONE   Final   Culture  Setup Time 03/28/2013 01:48   Final   Colony Count >=100,000 COLONIES/ML   Final   Culture ESCHERICHIA COLI   Final   Report Status 03/29/2013 FINAL   Final   Organism ID, Bacteria ESCHERICHIA COLI   Final     Labs: Basic Metabolic Panel:  Recent Labs Lab 03/27/13 1315 03/28/13 0419 03/29/13 0400  NA 140 138 139  K 3.5 3.2* 3.9  CL 104 107 107  CO2 24 24 26   GLUCOSE 132* 122* 107*  BUN 22 20 10   CREATININE 0.73 0.80 0.66  CALCIUM 8.3* 7.6*  7.9*   Liver Function Tests:  Recent Labs Lab 03/27/13 1315 03/28/13 0419  AST 22 19  ALT 18 14  ALKPHOS 44 31*  BILITOT 0.5 0.3  PROT 6.3 5.0*  ALBUMIN 3.5 2.7*    Recent Labs Lab 03/27/13 1315  LIPASE 34   CBC:  Recent Labs Lab 03/27/13 1315 03/28/13 0419 03/29/13 0400  WBC 7.0 4.8 5.5  NEUTROABS 6.2 3.5  --   HGB 13.6 12.0* 11.9*  HCT 39.8 35.0* 35.2*  MCV 87.5 88.4 88.7  PLT 190 159 176     IMAGING STUDIES Dg Abd Acute W/chest  03/27/2013  *RADIOLOGY REPORT*  Clinical Data: Emesis.  Diarrhea.  ACUTE ABDOMEN SERIES (ABDOMEN 2 VIEW & CHEST 1 VIEW)  Comparison: Chest x-ray 12/27/2012  Findings: Heart is enlarged.  The lungs are free of focal consolidations and pleural effusions.  There is minimal left lower lobe atelectasis.  No free intraperitoneal air.  Supine and erect views of the abdomen show a nonobstructive bowel gas pattern.  No organomegaly.  No abnormal calcifications.  There are degenerative changes in the left hip.  Status post right hip arthroplasty.  IMPRESSION: 1.  Cardiomegaly and left lower lobe atelectasis.  2.  Nonobstructive bowel gas pattern   Original Report Authenticated By: Norva Pavlov, M.D.     DISCHARGE EXAMINATION: Filed Vitals:   03/28/13 2241 03/29/13 0552 03/29/13 0944 03/29/13 1419  BP: 113/57 133/72 140/65 141/67  Pulse: 82 73 77 73  Temp: 98.9 F (37.2 C) 98.6 F (37 C)  98.4 F (36.9 C)  TempSrc: Oral Oral  Oral  Resp: 16 16  16   Height:      Weight:      SpO2: 96% 98%  100%   General appearance: alert, cooperative, appears stated age and no distress Resp: clear to auscultation bilaterally Cardio: regular rate and rhythm, S1, S2 normal, no murmur, click, rub or gallop GI: soft, non-tender; bowel sounds normal; no masses,  no organomegaly Extremities: extremities normal, atraumatic, no cyanosis or edema Neurologic: No focal deficits.  DISPOSITION: Home  Discharge Orders   Future Orders Complete By Expires     Diet - low sodium heart healthy  As directed     Discharge instructions  As directed     Comments:      Follow up with your PCP as needed. Keep yourself well hydrated.    Increase activity slowly  As directed       Current Discharge Medication List    START taking these medications   Details  cephALEXin (KEFLEX) 500 MG capsule Take 1 capsule (500 mg total) by mouth every 8 (eight) hours. Qty: 15 capsule, Refills: 0       CONTINUE these medications which have NOT CHANGED   Details  levothyroxine (SYNTHROID, LEVOTHROID) 75 MCG tablet Take 1 tablet (75 mcg total) by mouth daily. Qty: 90 tablet, Refills: 1   Associated Diagnoses: Hypothyroidism    meloxicam (MOBIC) 15 MG tablet Take 1 tablet (15 mg total) by mouth daily as needed. Qty: 90 tablet, Refills: 3   Associated Diagnoses: Pain    metoprolol (LOPRESSOR) 50 MG tablet Take 1 tablet (50 mg total) by mouth 2 (two) times daily.    Multiple Vitamin (MULTIVITAMIN) tablet Take 1 tablet by mouth daily.      omeprazole (PRILOSEC) 20 MG capsule Take 20 mg by mouth daily.      ondansetron (ZOFRAN-ODT) 4 MG disintegrating tablet Take 4 mg by mouth every 8 (eight) hours as  needed for nausea.    promethazine (PHENERGAN) 25 MG suppository Place 25 mg rectally every 6 (six) hours as needed for nausea.    simvastatin (ZOCOR) 40 MG tablet Take 40 mg by mouth every evening.       Follow-up Information   Follow up with METHENEY,CATHERINE, MD. Schedule an appointment as soon as possible for a visit in 2 weeks. (As needed)    Contact information:   [address] [place] [address] [phone]       TOTAL DISCHARGE TIME: 35 mins  [facility]  [facility] Pager [phone]  03/29/2013, 2:37 PM  Urine culture grew E coli sensitive to cefazolin. Patient was prescribed Keflex which will cover the organism.  Ynez Eugenio 03/30/2013 8:16 AM

## 2013-03-31 ENCOUNTER — Encounter: Payer: Self-pay | Admitting: Physician Assistant

## 2013-03-31 ENCOUNTER — Ambulatory Visit (HOSPITAL_BASED_OUTPATIENT_CLINIC_OR_DEPARTMENT_OTHER)
Admission: RE | Admit: 2013-03-31 | Discharge: 2013-03-31 | Disposition: A | Discharge status: Home / Self Care | Payer: Medicare Other | Source: Ambulatory Visit | Attending: Physician Assistant | Admitting: Physician Assistant

## 2013-03-31 ENCOUNTER — Ambulatory Visit (INDEPENDENT_AMBULATORY_CARE_PROVIDER_SITE_OTHER): Payer: Medicare Other | Admitting: Physician Assistant

## 2013-03-31 VITALS — BP 134/87 | HR 81 | Wt 199.0 lb

## 2013-03-31 DIAGNOSIS — R197 Diarrhea, unspecified: Secondary | ICD-10-CM

## 2013-03-31 DIAGNOSIS — Z96649 Presence of unspecified artificial hip joint: Secondary | ICD-10-CM | POA: Insufficient documentation

## 2013-03-31 DIAGNOSIS — R110 Nausea: Secondary | ICD-10-CM

## 2013-03-31 DIAGNOSIS — R1032 Left lower quadrant pain: Secondary | ICD-10-CM

## 2013-03-31 DIAGNOSIS — R109 Unspecified abdominal pain: Secondary | ICD-10-CM | POA: Insufficient documentation

## 2013-03-31 DIAGNOSIS — N390 Urinary tract infection, site not specified: Secondary | ICD-10-CM | POA: Diagnosis not present

## 2013-03-31 DIAGNOSIS — K5730 Diverticulosis of large intestine without perforation or abscess without bleeding: Secondary | ICD-10-CM | POA: Diagnosis not present

## 2013-03-31 DIAGNOSIS — Z8719 Personal history of other diseases of the digestive system: Secondary | ICD-10-CM

## 2013-03-31 DIAGNOSIS — I708 Atherosclerosis of other arteries: Secondary | ICD-10-CM | POA: Diagnosis not present

## 2013-03-31 DIAGNOSIS — K5732 Diverticulitis of large intestine without perforation or abscess without bleeding: Secondary | ICD-10-CM | POA: Diagnosis not present

## 2013-03-31 LAB — POCT URINALYSIS DIPSTICK
Blood, UA: NEGATIVE
Glucose, UA: NEGATIVE
Protein, UA: 30
Spec Grav, UA: 1.02
Urobilinogen, UA: 1
pH, UA: 6

## 2013-03-31 MED ORDER — ONDANSETRON HCL 4 MG PO TABS: 4.0000 mg | ORAL_TABLET | Freq: Three times a day (TID) | ORAL | Status: DC | PRN

## 2013-03-31 MED ORDER — AMOXICILLIN-POT CLAVULANATE 500-125 MG PO TABS: 1.0000 | ORAL_TABLET | Freq: Three times a day (TID) | ORAL | Status: DC

## 2013-03-31 MED ORDER — IOHEXOL 300 MG/ML  SOLN
100.0000 mL | Freq: Once | INTRAMUSCULAR | Status: AC | PRN
  Administered 2013-03-31: 100 mL via INTRAVENOUS

## 2013-03-31 NOTE — Progress Notes (Signed)
  Subjective:    Patient ID: [name], male    DOB: 07/13/1941, 72 y.o.   MRN: [id number]  HPI Patient is a 72 yo male who presents to the clinic after 2 hospital visits with ongoing lower abdominal pain and discomfort that is worse of the left than the right. All started 03/23/13 with nausea/vomiting/diarrhea that got so bad he went to ER they send him home after fluids and said he had gastroenteritis. Once he got home he got really bad again with diarrhea, nausea,  and abominal pain. This time he went to [facility]. Bloodwork looked great but found a UTI. He was given a shot of rocephin and sent home with keflex. He was discharged after 2 days and felt like he was getting better until last night. Last night his abdominal pain was increasing and he had started to feel weaker and weaker. He denies any fever or chills. He does have a hx of diverticulitis twice that needed IV abx to treat. He is concerned it could be something else.       Review of Systems     Objective:   Physical Exam  Constitutional: He is oriented to person, place, and time. He appears well-developed and well-nourished.  HENT:  Head: Normocephalic and atraumatic.  Eyes: Conjunctivae are normal.  Neck: Normal range of motion. Neck supple.  Cardiovascular: Normal rate, regular rhythm and normal heart sounds.   Pulmonary/Chest: Effort normal and breath sounds normal. He has no wheezes.  No CVA tenderness.   Abdominal: Bowel sounds are normal.  Some guarding over LLQ. Pain with palpation even with stheoscopy of LLQ. No rebound.    Lymphadenopathy:    He has no cervical adenopathy.  Neurological: He is alert and oriented to person, place, and time.  Skin:  Flushed bilateral cheeks.   Psychiatric: He has a normal mood and affect. His behavior is normal.          Assessment & Plan:  LLQ pain/hx of diverticulitis/n/v/d- suspect this could be diverticulitis. Rechecked UA negative for blood, lueks, nitrates but did have  bilirubin. Reassured UTI is responding to abx. Will get CT with contrast. Will send rx for nausea. Kidneys look good for contrast via labs at the hospital.

## 2013-03-31 NOTE — Patient Instructions (Addendum)
Will get CT scan. Will recheck urine. Will call with results today.

## 2013-04-03 DIAGNOSIS — K5732 Diverticulitis of large intestine without perforation or abscess without bleeding: Secondary | ICD-10-CM | POA: Diagnosis not present

## 2013-04-17 ENCOUNTER — Telehealth: Payer: Self-pay | Admitting: Physician Assistant

## 2013-04-17 NOTE — Telephone Encounter (Signed)
Spoke with pt & he is feeling better.  Sigmoidoscopy is already scheduled as well as a colonoscopy.

## 2013-04-17 NOTE — Telephone Encounter (Signed)
Call pt: Make sure he is doing well after episode of diverticulitis. It was suggested that follow up sigmoidoscopy to be done after treatment. Does he have a GI doctor he goes to here? i would like to get that scheduled.

## 2013-04-17 NOTE — Telephone Encounter (Signed)
Message copied by Jomarie Longs on Mon Apr 17, 2013  8:32 AM ------      Message from: Caro, Lonna Cobb      Created: Fri Mar 31, 2013  5:05 PM       Follow up diverticulitis sigmoidoscopy. ------

## 2013-04-19 DIAGNOSIS — L570 Actinic keratosis: Secondary | ICD-10-CM | POA: Diagnosis not present

## 2013-05-31 DIAGNOSIS — L570 Actinic keratosis: Secondary | ICD-10-CM | POA: Diagnosis not present

## 2013-07-18 DIAGNOSIS — I4949 Other premature depolarization: Secondary | ICD-10-CM | POA: Diagnosis not present

## 2013-07-18 DIAGNOSIS — I059 Rheumatic mitral valve disease, unspecified: Secondary | ICD-10-CM | POA: Diagnosis not present

## 2013-07-18 DIAGNOSIS — I2510 Atherosclerotic heart disease of native coronary artery without angina pectoris: Secondary | ICD-10-CM | POA: Diagnosis not present

## 2013-07-18 DIAGNOSIS — I428 Other cardiomyopathies: Secondary | ICD-10-CM | POA: Diagnosis not present

## 2013-07-18 DIAGNOSIS — I351 Nonrheumatic aortic (valve) insufficiency: Secondary | ICD-10-CM | POA: Insufficient documentation

## 2013-07-18 DIAGNOSIS — I359 Nonrheumatic aortic valve disorder, unspecified: Secondary | ICD-10-CM | POA: Diagnosis not present

## 2013-07-20 ENCOUNTER — Encounter: Payer: Self-pay | Admitting: Family Medicine

## 2013-07-20 DIAGNOSIS — I5189 Other ill-defined heart diseases: Secondary | ICD-10-CM | POA: Insufficient documentation

## 2013-07-20 DIAGNOSIS — I272 Pulmonary hypertension, unspecified: Secondary | ICD-10-CM | POA: Insufficient documentation

## 2013-08-10 DIAGNOSIS — M1610 Unilateral primary osteoarthritis, unspecified hip: Secondary | ICD-10-CM | POA: Diagnosis not present

## 2013-08-14 DIAGNOSIS — H60399 Other infective otitis externa, unspecified ear: Secondary | ICD-10-CM | POA: Diagnosis not present

## 2013-08-23 DIAGNOSIS — K5730 Diverticulosis of large intestine without perforation or abscess without bleeding: Secondary | ICD-10-CM | POA: Diagnosis not present

## 2013-08-23 DIAGNOSIS — K5732 Diverticulitis of large intestine without perforation or abscess without bleeding: Secondary | ICD-10-CM | POA: Diagnosis not present

## 2013-08-24 DIAGNOSIS — K630 Abscess of intestine: Secondary | ICD-10-CM | POA: Diagnosis present

## 2013-08-24 DIAGNOSIS — I2510 Atherosclerotic heart disease of native coronary artery without angina pectoris: Secondary | ICD-10-CM | POA: Diagnosis present

## 2013-08-24 DIAGNOSIS — E785 Hyperlipidemia, unspecified: Secondary | ICD-10-CM | POA: Diagnosis present

## 2013-08-24 DIAGNOSIS — I10 Essential (primary) hypertension: Secondary | ICD-10-CM | POA: Diagnosis present

## 2013-08-24 DIAGNOSIS — L0291 Cutaneous abscess, unspecified: Secondary | ICD-10-CM | POA: Diagnosis not present

## 2013-08-24 DIAGNOSIS — Z9889 Other specified postprocedural states: Secondary | ICD-10-CM | POA: Diagnosis not present

## 2013-08-24 DIAGNOSIS — N1330 Unspecified hydronephrosis: Secondary | ICD-10-CM | POA: Diagnosis not present

## 2013-08-24 DIAGNOSIS — E079 Disorder of thyroid, unspecified: Secondary | ICD-10-CM | POA: Diagnosis present

## 2013-08-24 DIAGNOSIS — Z87891 Personal history of nicotine dependence: Secondary | ICD-10-CM | POA: Diagnosis not present

## 2013-08-24 DIAGNOSIS — K651 Peritoneal abscess: Secondary | ICD-10-CM | POA: Diagnosis not present

## 2013-08-24 DIAGNOSIS — E876 Hypokalemia: Secondary | ICD-10-CM | POA: Diagnosis not present

## 2013-08-24 DIAGNOSIS — M129 Arthropathy, unspecified: Secondary | ICD-10-CM | POA: Diagnosis present

## 2013-08-24 DIAGNOSIS — K5732 Diverticulitis of large intestine without perforation or abscess without bleeding: Secondary | ICD-10-CM | POA: Diagnosis not present

## 2013-08-24 DIAGNOSIS — Z7982 Long term (current) use of aspirin: Secondary | ICD-10-CM | POA: Diagnosis not present

## 2013-08-24 DIAGNOSIS — I059 Rheumatic mitral valve disease, unspecified: Secondary | ICD-10-CM | POA: Diagnosis present

## 2013-08-24 DIAGNOSIS — Z8673 Personal history of transient ischemic attack (TIA), and cerebral infarction without residual deficits: Secondary | ICD-10-CM | POA: Diagnosis not present

## 2013-08-24 DIAGNOSIS — D649 Anemia, unspecified: Secondary | ICD-10-CM | POA: Diagnosis present

## 2013-08-24 DIAGNOSIS — R21 Rash and other nonspecific skin eruption: Secondary | ICD-10-CM | POA: Diagnosis not present

## 2013-08-24 DIAGNOSIS — K5289 Other specified noninfective gastroenteritis and colitis: Secondary | ICD-10-CM | POA: Diagnosis not present

## 2013-08-24 DIAGNOSIS — Z79899 Other long term (current) drug therapy: Secondary | ICD-10-CM | POA: Diagnosis not present

## 2013-08-24 DIAGNOSIS — Z883 Allergy status to other anti-infective agents status: Secondary | ICD-10-CM | POA: Diagnosis not present

## 2013-08-26 DIAGNOSIS — K5732 Diverticulitis of large intestine without perforation or abscess without bleeding: Secondary | ICD-10-CM | POA: Insufficient documentation

## 2013-09-07 ENCOUNTER — Encounter: Payer: Self-pay | Admitting: Family Medicine

## 2013-09-07 ENCOUNTER — Ambulatory Visit (INDEPENDENT_AMBULATORY_CARE_PROVIDER_SITE_OTHER): Payer: Medicare Other | Admitting: Family Medicine

## 2013-09-07 VITALS — BP 123/63 | HR 63 | Wt 195.0 lb

## 2013-09-07 DIAGNOSIS — K5732 Diverticulitis of large intestine without perforation or abscess without bleeding: Secondary | ICD-10-CM | POA: Diagnosis not present

## 2013-09-07 NOTE — Progress Notes (Signed)
Subjective:    Patient ID: [name], male    DOB: [birth year], 72 y.o.   MRN: [id number]  HPI He had another episode of diverticulitis. By time got in with GI it had progressed. Had Ct scan that showed abscess.  Was admitted to hospital for 8 days. GI felt he needed surgery. Surgeon rec hold off on surgery until things calmed down.  Then had a repeat CT about 3 days later that showed the abscess is larger.  He  Had CT on d/c that showed it was slightly smaller.  He would like to have a second opinion for this. He has been seeing Dr. Noe Gens at [facility] for about 12 years. His concern is that every time he goes racing at the hospital but he never actually sees Dr. Noe Gens.  Has one more week of Augmentin BID.  Appetite is back.  Doing well.  No more pain or tenderness.    Review of Systems     BP 123/63  Pulse 63  Wt 195 lb (88.451 kg)  BMI 27.21 kg/m2    Allergies  Allergen Reactions  . Flagyl [Metronidazole] Nausea And Vomiting    Past Medical History  Diagnosis Date  . Insulin resistance   . LV dysfunction     reduction  . Osteoarthritis   . Hearing aid worn   . Abdominal wall defect, acquired   . Blockage of coronary artery of heart     30%    Past Surgical History  Procedure Laterality Date  . Acne cyst removal  2012    Back   . Appendectomy    . Tonsillectomy    . Cardiac catheterization    . Rt hip replacement      History   Social History  . Marital Status: Married    Spouse Name: [name]    Number of Children: [name]  . Years of Education: [name]   Occupational History  . 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 Smoking status: Former Smoker    Quit date: 12/21/1985  . Smokeless tobacco: Not on file  . Alcohol Use: No  . Drug Use: No  . 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 Not on file   Social History Narrative   Born in  [place].     Family History  Problem Relation Age of Onset  . Heart disease Mother     pacemaker    Outpatient Encounter Prescriptions as of 09/07/2013  Medication Sig Dispense Refill  . amoxicillin-clavulanate (AUGMENTIN) 500-125 MG per tablet Take 1 tablet (500 mg total) by mouth 3 (three) times daily.  21 tablet  0  . levothyroxine (SYNTHROID, LEVOTHROID) 75 MCG tablet Take 1 tablet (75 mcg total) by mouth daily.  90 tablet  1  . meloxicam (MOBIC) 15 MG tablet Take 1 tablet (15 mg total) by mouth daily as needed.  90 tablet  3  . metoprolol (LOPRESSOR) 50 MG tablet Take 1 tablet (50 mg total) by mouth 2 (two) times daily.      . Multiple Vitamin (MULTIVITAMIN) tablet Take 1 tablet by mouth daily.        [name] omeprazole (PRILOSEC) 20 MG capsule Take 20 mg by mouth daily.        . [DISCONTINUED] cephALEXin (KEFLEX) 500 MG capsule Take 1 capsule (500  mg total) by mouth every 8 (eight) hours.  15 capsule  0  . [DISCONTINUED] ondansetron (ZOFRAN) 4 MG tablet Take 1 tablet (4 mg total) by mouth every 8 (eight) hours as needed for nausea.  20 tablet  0  . [DISCONTINUED] ondansetron (ZOFRAN-ODT) 4 MG disintegrating tablet Take 4 mg by mouth every 8 (eight) hours as needed for nausea.      . [DISCONTINUED] promethazine (PHENERGAN) 25 MG suppository Place 25 mg rectally every 6 (six) hours as needed for nausea.      . [DISCONTINUED] simvastatin (ZOCOR) 40 MG tablet Take 40 mg by mouth every evening.       No facility-administered encounter medications on file as of 09/07/2013.       Objective:   Physical Exam  Constitutional: He is oriented to person, place, and time. He appears well-developed and well-nourished.  HENT:  Head: Normocephalic and atraumatic.  Cardiovascular: Normal rate, regular rhythm and normal heart sounds.   Pulmonary/Chest: Effort normal and breath sounds normal.  Abdominal: Soft. Bowel sounds are normal. He exhibits no distension and no mass. There is no tenderness. There  is no rebound and no guarding.  Neurological: He is alert and oriented to person, place, and time.  Skin: Skin is warm and dry.  Psychiatric: He has a normal mood and affect. His behavior is normal.          Assessment & Plan:  Diverticulitis - he is much better overall physically. Abdomen is nontender today which is reassuring. He still has about 10 more days on the Augmentin. I would like to get him in with GI. He prefer to be seen in [place]. Make sure complete antibiotics. I did encourage him to try get a copy of the CT scans that were done on a CD so that GI can take a look at these. He had originally recommended a colonoscopy on October and then surgery after that. He is very worried that once he finishes his antibiotics at the abscess may or may not be resolved. We will check a CBC today just to make sure that his white count is within the normal range and is staying close to 6 which was what it was around discharge from hospital. He is not opposed to having surgery he just did not have a good experience overall during his last hospitalization and is a little bit upset and wanting to see a different GI specialists.

## 2013-09-08 DIAGNOSIS — K5732 Diverticulitis of large intestine without perforation or abscess without bleeding: Secondary | ICD-10-CM | POA: Diagnosis not present

## 2013-09-08 LAB — CBC WITH DIFFERENTIAL/PLATELET
Eosinophils Absolute: 0.4 10*3/uL (ref 0.0–0.7)
Eosinophils Relative: 6 % — ABNORMAL HIGH (ref 0–5)
Lymphs Abs: 2.1 10*3/uL (ref 0.7–4.0)
MCH: 29.4 pg (ref 26.0–34.0)
MCHC: 34.2 g/dL (ref 30.0–36.0)
MCV: 85.9 fL (ref 78.0–100.0)
Monocytes Relative: 9 % (ref 3–12)
Platelets: 279 10*3/uL (ref 150–400)
RBC: 4.39 MIL/uL (ref 4.22–5.81)

## 2013-09-12 ENCOUNTER — Encounter: Payer: Self-pay | Admitting: Family Medicine

## 2013-09-14 DIAGNOSIS — K5732 Diverticulitis of large intestine without perforation or abscess without bleeding: Secondary | ICD-10-CM | POA: Diagnosis not present

## 2013-09-19 DIAGNOSIS — K5732 Diverticulitis of large intestine without perforation or abscess without bleeding: Secondary | ICD-10-CM | POA: Diagnosis not present

## 2013-09-19 DIAGNOSIS — R188 Other ascites: Secondary | ICD-10-CM | POA: Diagnosis not present

## 2013-10-09 DIAGNOSIS — L570 Actinic keratosis: Secondary | ICD-10-CM | POA: Diagnosis not present

## 2013-10-09 DIAGNOSIS — L259 Unspecified contact dermatitis, unspecified cause: Secondary | ICD-10-CM | POA: Diagnosis not present

## 2013-10-17 DIAGNOSIS — Z01818 Encounter for other preprocedural examination: Secondary | ICD-10-CM | POA: Diagnosis not present

## 2013-10-17 DIAGNOSIS — K5732 Diverticulitis of large intestine without perforation or abscess without bleeding: Secondary | ICD-10-CM | POA: Diagnosis not present

## 2013-10-17 DIAGNOSIS — K5730 Diverticulosis of large intestine without perforation or abscess without bleeding: Secondary | ICD-10-CM | POA: Diagnosis not present

## 2013-10-19 ENCOUNTER — Encounter: Payer: Self-pay | Admitting: Family Medicine

## 2013-10-19 DIAGNOSIS — K5732 Diverticulitis of large intestine without perforation or abscess without bleeding: Secondary | ICD-10-CM | POA: Insufficient documentation

## 2013-10-30 DIAGNOSIS — K5732 Diverticulitis of large intestine without perforation or abscess without bleeding: Secondary | ICD-10-CM | POA: Diagnosis not present

## 2013-11-06 DIAGNOSIS — Z5331 Laparoscopic surgical procedure converted to open procedure: Secondary | ICD-10-CM | POA: Diagnosis not present

## 2013-11-06 DIAGNOSIS — Q438 Other specified congenital malformations of intestine: Secondary | ICD-10-CM | POA: Diagnosis not present

## 2013-11-06 DIAGNOSIS — K929 Disease of digestive system, unspecified: Secondary | ICD-10-CM | POA: Diagnosis present

## 2013-11-06 DIAGNOSIS — K630 Abscess of intestine: Secondary | ICD-10-CM | POA: Diagnosis not present

## 2013-11-06 DIAGNOSIS — K660 Peritoneal adhesions (postprocedural) (postinfection): Secondary | ICD-10-CM | POA: Diagnosis present

## 2013-11-06 DIAGNOSIS — K5732 Diverticulitis of large intestine without perforation or abscess without bleeding: Secondary | ICD-10-CM | POA: Diagnosis not present

## 2013-11-06 DIAGNOSIS — K560 Paralytic ileus: Secondary | ICD-10-CM | POA: Diagnosis present

## 2013-11-18 DIAGNOSIS — Z452 Encounter for adjustment and management of vascular access device: Secondary | ICD-10-CM | POA: Diagnosis not present

## 2013-11-18 DIAGNOSIS — I428 Other cardiomyopathies: Secondary | ICD-10-CM | POA: Diagnosis present

## 2013-11-18 DIAGNOSIS — T80211A Bloodstream infection due to central venous catheter, initial encounter: Secondary | ICD-10-CM | POA: Diagnosis not present

## 2013-11-18 DIAGNOSIS — R1084 Generalized abdominal pain: Secondary | ICD-10-CM | POA: Diagnosis not present

## 2013-11-18 DIAGNOSIS — Z7901 Long term (current) use of anticoagulants: Secondary | ICD-10-CM | POA: Diagnosis not present

## 2013-11-18 DIAGNOSIS — I359 Nonrheumatic aortic valve disorder, unspecified: Secondary | ICD-10-CM | POA: Diagnosis present

## 2013-11-18 DIAGNOSIS — N401 Enlarged prostate with lower urinary tract symptoms: Secondary | ICD-10-CM | POA: Diagnosis not present

## 2013-11-18 DIAGNOSIS — K6389 Other specified diseases of intestine: Secondary | ICD-10-CM | POA: Diagnosis not present

## 2013-11-18 DIAGNOSIS — N390 Urinary tract infection, site not specified: Secondary | ICD-10-CM | POA: Diagnosis not present

## 2013-11-18 DIAGNOSIS — B9689 Other specified bacterial agents as the cause of diseases classified elsewhere: Secondary | ICD-10-CM | POA: Diagnosis not present

## 2013-11-18 DIAGNOSIS — Z96649 Presence of unspecified artificial hip joint: Secondary | ICD-10-CM | POA: Diagnosis not present

## 2013-11-18 DIAGNOSIS — I2699 Other pulmonary embolism without acute cor pulmonale: Secondary | ICD-10-CM | POA: Diagnosis not present

## 2013-11-18 DIAGNOSIS — E860 Dehydration: Secondary | ICD-10-CM | POA: Diagnosis not present

## 2013-11-18 DIAGNOSIS — E785 Hyperlipidemia, unspecified: Secondary | ICD-10-CM | POA: Diagnosis present

## 2013-11-18 DIAGNOSIS — R109 Unspecified abdominal pain: Secondary | ICD-10-CM | POA: Diagnosis not present

## 2013-11-18 DIAGNOSIS — Z23 Encounter for immunization: Secondary | ICD-10-CM | POA: Diagnosis not present

## 2013-11-18 DIAGNOSIS — K560 Paralytic ileus: Secondary | ICD-10-CM | POA: Diagnosis present

## 2013-11-18 DIAGNOSIS — Z8673 Personal history of transient ischemic attack (TIA), and cerebral infarction without residual deficits: Secondary | ICD-10-CM | POA: Diagnosis not present

## 2013-11-18 DIAGNOSIS — Z87891 Personal history of nicotine dependence: Secondary | ICD-10-CM | POA: Diagnosis not present

## 2013-11-18 DIAGNOSIS — N138 Other obstructive and reflux uropathy: Secondary | ICD-10-CM | POA: Diagnosis not present

## 2013-11-18 DIAGNOSIS — I2510 Atherosclerotic heart disease of native coronary artery without angina pectoris: Secondary | ICD-10-CM | POA: Diagnosis present

## 2013-11-18 DIAGNOSIS — K929 Disease of digestive system, unspecified: Secondary | ICD-10-CM | POA: Diagnosis present

## 2013-11-18 DIAGNOSIS — N400 Enlarged prostate without lower urinary tract symptoms: Secondary | ICD-10-CM | POA: Diagnosis not present

## 2013-11-18 DIAGNOSIS — R300 Dysuria: Secondary | ICD-10-CM | POA: Diagnosis not present

## 2013-11-18 DIAGNOSIS — K56609 Unspecified intestinal obstruction, unspecified as to partial versus complete obstruction: Secondary | ICD-10-CM | POA: Diagnosis present

## 2013-11-18 DIAGNOSIS — R188 Other ascites: Secondary | ICD-10-CM | POA: Diagnosis not present

## 2013-11-18 DIAGNOSIS — I4949 Other premature depolarization: Secondary | ICD-10-CM | POA: Diagnosis present

## 2013-11-18 DIAGNOSIS — D72829 Elevated white blood cell count, unspecified: Secondary | ICD-10-CM | POA: Diagnosis not present

## 2013-11-18 DIAGNOSIS — R112 Nausea with vomiting, unspecified: Secondary | ICD-10-CM | POA: Diagnosis not present

## 2013-11-20 HISTORY — PX: COLECTOMY: SHX59

## 2013-12-15 DIAGNOSIS — Z7901 Long term (current) use of anticoagulants: Secondary | ICD-10-CM | POA: Diagnosis not present

## 2013-12-15 DIAGNOSIS — Z5181 Encounter for therapeutic drug level monitoring: Secondary | ICD-10-CM | POA: Diagnosis not present

## 2013-12-19 ENCOUNTER — Ambulatory Visit: Payer: Self-pay | Admitting: Family Medicine

## 2013-12-19 ENCOUNTER — Encounter: Payer: Self-pay | Admitting: Family Medicine

## 2013-12-19 ENCOUNTER — Ambulatory Visit (INDEPENDENT_AMBULATORY_CARE_PROVIDER_SITE_OTHER): Payer: Medicare Other | Admitting: Family Medicine

## 2013-12-19 VITALS — BP 121/76 | HR 57 | Temp 97.9°F | Ht 71.0 in | Wt 187.0 lb

## 2013-12-19 DIAGNOSIS — Z86711 Personal history of pulmonary embolism: Secondary | ICD-10-CM | POA: Insufficient documentation

## 2013-12-19 DIAGNOSIS — T80219D Unspecified infection due to central venous catheter, subsequent encounter: Secondary | ICD-10-CM

## 2013-12-19 DIAGNOSIS — I2699 Other pulmonary embolism without acute cor pulmonale: Secondary | ICD-10-CM

## 2013-12-19 DIAGNOSIS — Z9049 Acquired absence of other specified parts of digestive tract: Secondary | ICD-10-CM

## 2013-12-19 NOTE — Progress Notes (Signed)
Subjective:    Patient ID: [name], male    DOB: 1941-07-06, 72 y.o.   MRN: [id number]  HPI Hospitalized 11/29- 12/24 for small bowel obstruction, PICC line infection and small blood clot -pulmonary. Denies any cough or SOB. Afebrile.   Neg venous dopplers. Felt to be from inactivity vs post surgical vs PICC line that was infected. He's been eating more normally since she's been home. Pain has been under good control. He reports that he was provided information about foods and medications that potentially interact with the Coumadin.  From D/C summary from [facility]: "Hospital Course:  Patient is readmitted following previous robotic sigmoid colectomy 11/08/13 for comfort to diverticular disease. Patient readmitted secondary to failure to thrive nausea or vomiting. All Foley he had signs and symptoms consistent with a partial small bowel obstruction which took some time to resolve. He also developed a PICC line infection and on subsequent workup for fever was found to have incidental small pulmonary embolus peripherally located. He was started on Lovenox and Coumadin and will continue Coumadin in the outpatient setting. The patient will take 2.5 mg today and tomorrow and then have his PT/INR checked on Friday. Otherwise he will continue a soft diet and again will see [place] in the office on Friday"    Review of Systems  BP 121/76  Pulse 57  Temp(Src) 97.9 F (36.6 C)  Ht 5\' 11"  (1.803 m)  Wt 187 lb (84.823 kg)  BMI 26.09 kg/m2    Allergies  Allergen Reactions  . Flagyl [Metronidazole] Nausea And Vomiting    Past Medical History  Diagnosis Date  . Insulin resistance   . LV dysfunction     reduction  . Osteoarthritis   . Hearing aid worn   . Abdominal wall defect, acquired   . Blockage of coronary artery of heart     30%    Past Surgical History  Procedure Laterality Date  . Acne cyst removal  2012    Back   . Appendectomy    . Tonsillectomy    . Cardiac catheterization     . Rt hip replacement      History   Social History  . Marital Status: Married    Spouse Name: [name]    Number of Children: [name]  . Years of Education: [name]   Occupational History  . 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 Smoking status: Former Smoker    Quit date: 12/21/1985  . Smokeless tobacco: Not on file  . Alcohol Use: No  . Drug Use: No  . 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 Not on file   Social History Narrative   Born in [place].     Family History  Problem Relation Age of Onset  . Heart disease Mother     pacemaker    Outpatient Encounter Prescriptions as of 12/19/2013  Medication Sig  . levothyroxine (SYNTHROID, LEVOTHROID) 75 MCG tablet Take 1 tablet (75 mcg total) by mouth daily.  . meloxicam (MOBIC) 15 MG tablet Take 1 tablet (15 mg total) by mouth daily as needed.  . metoprolol (LOPRESSOR) 50 MG tablet Take 1 tablet (50 mg total) by mouth 2 (two) times daily.  . Multiple Vitamin (MULTIVITAMIN) tablet Take 1 tablet by mouth daily.    [name] omeprazole (PRILOSEC) 20 MG  capsule Take 20 mg by mouth daily.    [name] warfarin (COUMADIN) 2.5 MG tablet Take 2.5 mg by mouth daily.  . [DISCONTINUED] amoxicillin-clavulanate (AUGMENTIN) 500-125 MG per tablet Take 1 tablet (500 mg total) by mouth 3 (three) times daily.          Objective:   Physical Exam  Constitutional: He is oriented to person, place, and time. He appears well-developed and well-nourished.  HENT:  Head: Normocephalic and atraumatic.  Cardiovascular: Normal rate, regular rhythm and normal heart sounds.   Pulmonary/Chest: Effort normal and breath sounds normal.  Neurological: He is alert and oriented to person, place, and time.  Skin: Skin is warm and dry.  Psychiatric: He has a normal mood and affect. His behavior is normal.          Assessment & Plan:  PE - we  will be managing his Coumadin for the next 3-6 months. Goal INR is 2-3. Taking 5mg  for the last few days. Was previously on 2.5 mg daily. He says his Coumadin level was at goal before discharge on December 24. It had drifted down to less than 2 when he had a recheck done, he believes on Friday. Because he says that our lab was closed.  PICC line infection-resolved. And afebrile since she's been home and has been feeling better. He's been eating more normally. He was not sent home on any antibiotics.  Status post colectomy-doing well overall. He has followup with the surgeon on January 6.

## 2013-12-22 ENCOUNTER — Ambulatory Visit (INDEPENDENT_AMBULATORY_CARE_PROVIDER_SITE_OTHER): Payer: Medicare Other | Admitting: Family Medicine

## 2013-12-22 ENCOUNTER — Encounter: Payer: Self-pay | Admitting: *Deleted

## 2013-12-22 VITALS — BP 113/73 | HR 71 | Temp 97.8°F | Wt 187.0 lb

## 2013-12-22 DIAGNOSIS — I2699 Other pulmonary embolism without acute cor pulmonale: Secondary | ICD-10-CM | POA: Diagnosis not present

## 2013-12-22 LAB — POCT INR: INR: 3.7

## 2013-12-22 NOTE — Progress Notes (Signed)
Pt here for INR check no missed doses, no bruising, no changes in diet. Marland KitchenMaryruth Petersen, [name]

## 2013-12-26 ENCOUNTER — Encounter: Payer: Self-pay | Admitting: *Deleted

## 2013-12-26 ENCOUNTER — Ambulatory Visit (INDEPENDENT_AMBULATORY_CARE_PROVIDER_SITE_OTHER): Payer: Medicare Other | Admitting: Family Medicine

## 2013-12-26 VITALS — BP 119/71 | HR 125

## 2013-12-26 DIAGNOSIS — I2699 Other pulmonary embolism without acute cor pulmonale: Secondary | ICD-10-CM | POA: Diagnosis not present

## 2013-12-26 LAB — POCT INR: INR: 3.7

## 2013-12-26 NOTE — Progress Notes (Signed)
   Subjective:    Patient ID: [name], male    DOB: 09-29-1941, 73 y.o.   MRN: [id number] Pt notified of new dosing instructions & to recheck in one week.  Beatris Ship, CMA HPI    Review of Systems     Objective:   Physical Exam        Assessment & Plan:

## 2014-01-02 DIAGNOSIS — M999 Biomechanical lesion, unspecified: Secondary | ICD-10-CM | POA: Diagnosis not present

## 2014-01-02 DIAGNOSIS — M9981 Other biomechanical lesions of cervical region: Secondary | ICD-10-CM | POA: Diagnosis not present

## 2014-01-02 DIAGNOSIS — S139XXA Sprain of joints and ligaments of unspecified parts of neck, initial encounter: Secondary | ICD-10-CM | POA: Diagnosis not present

## 2014-01-03 ENCOUNTER — Telehealth: Payer: Self-pay | Admitting: *Deleted

## 2014-01-03 ENCOUNTER — Ambulatory Visit (INDEPENDENT_AMBULATORY_CARE_PROVIDER_SITE_OTHER): Payer: Medicare Other | Admitting: Family Medicine

## 2014-01-03 VITALS — BP 96/59 | HR 57

## 2014-01-03 DIAGNOSIS — I2699 Other pulmonary embolism without acute cor pulmonale: Secondary | ICD-10-CM | POA: Diagnosis not present

## 2014-01-03 LAB — POCT INR: INR: 2.8

## 2014-01-03 NOTE — Telephone Encounter (Signed)
Pt informed of dosing for coumadin and told to RTC in 2 weeks for recheck. Pt voiced understanding and agreed.[name]

## 2014-01-04 DIAGNOSIS — S139XXA Sprain of joints and ligaments of unspecified parts of neck, initial encounter: Secondary | ICD-10-CM | POA: Diagnosis not present

## 2014-01-04 DIAGNOSIS — M9981 Other biomechanical lesions of cervical region: Secondary | ICD-10-CM | POA: Diagnosis not present

## 2014-01-04 DIAGNOSIS — M999 Biomechanical lesion, unspecified: Secondary | ICD-10-CM | POA: Diagnosis not present

## 2014-01-09 DIAGNOSIS — S139XXA Sprain of joints and ligaments of unspecified parts of neck, initial encounter: Secondary | ICD-10-CM | POA: Diagnosis not present

## 2014-01-09 DIAGNOSIS — M9981 Other biomechanical lesions of cervical region: Secondary | ICD-10-CM | POA: Diagnosis not present

## 2014-01-09 DIAGNOSIS — M999 Biomechanical lesion, unspecified: Secondary | ICD-10-CM | POA: Diagnosis not present

## 2014-01-11 DIAGNOSIS — S139XXA Sprain of joints and ligaments of unspecified parts of neck, initial encounter: Secondary | ICD-10-CM | POA: Diagnosis not present

## 2014-01-11 DIAGNOSIS — M999 Biomechanical lesion, unspecified: Secondary | ICD-10-CM | POA: Diagnosis not present

## 2014-01-11 DIAGNOSIS — M9981 Other biomechanical lesions of cervical region: Secondary | ICD-10-CM | POA: Diagnosis not present

## 2014-01-15 DIAGNOSIS — M999 Biomechanical lesion, unspecified: Secondary | ICD-10-CM | POA: Diagnosis not present

## 2014-01-15 DIAGNOSIS — S139XXA Sprain of joints and ligaments of unspecified parts of neck, initial encounter: Secondary | ICD-10-CM | POA: Diagnosis not present

## 2014-01-15 DIAGNOSIS — M9981 Other biomechanical lesions of cervical region: Secondary | ICD-10-CM | POA: Diagnosis not present

## 2014-01-16 ENCOUNTER — Ambulatory Visit (INDEPENDENT_AMBULATORY_CARE_PROVIDER_SITE_OTHER): Payer: Medicare Other | Admitting: Family Medicine

## 2014-01-16 ENCOUNTER — Encounter: Payer: Self-pay | Admitting: *Deleted

## 2014-01-16 VITALS — BP 109/53 | HR 71

## 2014-01-16 DIAGNOSIS — I2699 Other pulmonary embolism without acute cor pulmonale: Secondary | ICD-10-CM

## 2014-01-16 LAB — POCT INR: INR: 2.2

## 2014-01-16 NOTE — Progress Notes (Signed)
   Subjective:    Patient ID: [name], male    DOB: 12/27/1940, 72 y.o.   MRN: [id number] Pt notified of dosing instructions. Beatris Ship, CMA HPI    Review of Systems     Objective:   Physical Exam        Assessment & Plan:

## 2014-01-18 DIAGNOSIS — M999 Biomechanical lesion, unspecified: Secondary | ICD-10-CM | POA: Diagnosis not present

## 2014-01-18 DIAGNOSIS — M9981 Other biomechanical lesions of cervical region: Secondary | ICD-10-CM | POA: Diagnosis not present

## 2014-01-18 DIAGNOSIS — S139XXA Sprain of joints and ligaments of unspecified parts of neck, initial encounter: Secondary | ICD-10-CM | POA: Diagnosis not present

## 2014-01-23 ENCOUNTER — Encounter: Payer: Self-pay | Admitting: *Deleted

## 2014-01-23 ENCOUNTER — Ambulatory Visit (INDEPENDENT_AMBULATORY_CARE_PROVIDER_SITE_OTHER): Payer: Medicare Other | Admitting: Sports Medicine

## 2014-01-23 VITALS — BP 124/68 | HR 99

## 2014-01-23 DIAGNOSIS — I2699 Other pulmonary embolism without acute cor pulmonale: Secondary | ICD-10-CM | POA: Diagnosis not present

## 2014-01-23 LAB — POCT INR: INR: 2.4

## 2014-01-23 NOTE — Progress Notes (Signed)
   Subjective:    Patient ID: [name], male    DOB: [birth year], 73 y.o.   MRN: [id number] Kenney Houseman called pt & notified him of dosing instructions. HPI    Review of Systems     Objective:   Physical Exam        Assessment & Plan:

## 2014-01-24 DIAGNOSIS — S139XXA Sprain of joints and ligaments of unspecified parts of neck, initial encounter: Secondary | ICD-10-CM | POA: Diagnosis not present

## 2014-01-24 DIAGNOSIS — M999 Biomechanical lesion, unspecified: Secondary | ICD-10-CM | POA: Diagnosis not present

## 2014-01-24 DIAGNOSIS — M9981 Other biomechanical lesions of cervical region: Secondary | ICD-10-CM | POA: Diagnosis not present

## 2014-01-31 DIAGNOSIS — M999 Biomechanical lesion, unspecified: Secondary | ICD-10-CM | POA: Diagnosis not present

## 2014-01-31 DIAGNOSIS — M9981 Other biomechanical lesions of cervical region: Secondary | ICD-10-CM | POA: Diagnosis not present

## 2014-01-31 DIAGNOSIS — S139XXA Sprain of joints and ligaments of unspecified parts of neck, initial encounter: Secondary | ICD-10-CM | POA: Diagnosis not present

## 2014-02-08 DIAGNOSIS — I2699 Other pulmonary embolism without acute cor pulmonale: Secondary | ICD-10-CM | POA: Diagnosis not present

## 2014-02-08 DIAGNOSIS — R0602 Shortness of breath: Secondary | ICD-10-CM | POA: Diagnosis not present

## 2014-02-08 DIAGNOSIS — Z9889 Other specified postprocedural states: Secondary | ICD-10-CM | POA: Diagnosis not present

## 2014-02-08 DIAGNOSIS — K5732 Diverticulitis of large intestine without perforation or abscess without bleeding: Secondary | ICD-10-CM | POA: Diagnosis not present

## 2014-02-13 ENCOUNTER — Encounter: Payer: Self-pay | Admitting: *Deleted

## 2014-02-13 ENCOUNTER — Ambulatory Visit (INDEPENDENT_AMBULATORY_CARE_PROVIDER_SITE_OTHER): Payer: Medicare Other | Admitting: Sports Medicine

## 2014-02-13 VITALS — BP 137/84 | HR 92

## 2014-02-13 DIAGNOSIS — I480 Paroxysmal atrial fibrillation: Secondary | ICD-10-CM

## 2014-02-13 DIAGNOSIS — I4891 Unspecified atrial fibrillation: Secondary | ICD-10-CM | POA: Diagnosis not present

## 2014-02-13 DIAGNOSIS — I2699 Other pulmonary embolism without acute cor pulmonale: Secondary | ICD-10-CM | POA: Diagnosis not present

## 2014-02-13 LAB — POCT INR: INR: 2.2

## 2014-02-13 NOTE — Progress Notes (Signed)
.  Pt here for INR check no missed doses, diet changes,bruising,bleeding,CP,SOB.[name]   

## 2014-02-14 ENCOUNTER — Telehealth: Payer: Self-pay | Admitting: *Deleted

## 2014-02-14 NOTE — Telephone Encounter (Signed)
Same dose. Half tab on Tuesday night. Then 2.5 mg all other days. Recheck in 4 weeks     recommendations given to patient .[name] [place]

## 2014-02-22 DIAGNOSIS — S139XXA Sprain of joints and ligaments of unspecified parts of neck, initial encounter: Secondary | ICD-10-CM | POA: Diagnosis not present

## 2014-02-22 DIAGNOSIS — J90 Pleural effusion, not elsewhere classified: Secondary | ICD-10-CM | POA: Diagnosis not present

## 2014-02-22 DIAGNOSIS — M999 Biomechanical lesion, unspecified: Secondary | ICD-10-CM | POA: Diagnosis not present

## 2014-02-22 DIAGNOSIS — J449 Chronic obstructive pulmonary disease, unspecified: Secondary | ICD-10-CM | POA: Diagnosis not present

## 2014-02-22 DIAGNOSIS — R0602 Shortness of breath: Secondary | ICD-10-CM | POA: Diagnosis not present

## 2014-02-22 DIAGNOSIS — M9981 Other biomechanical lesions of cervical region: Secondary | ICD-10-CM | POA: Diagnosis not present

## 2014-02-27 ENCOUNTER — Ambulatory Visit (INDEPENDENT_AMBULATORY_CARE_PROVIDER_SITE_OTHER): Payer: Medicare Other | Admitting: Family Medicine

## 2014-02-27 DIAGNOSIS — I2699 Other pulmonary embolism without acute cor pulmonale: Secondary | ICD-10-CM | POA: Diagnosis not present

## 2014-02-27 LAB — POCT INR: INR: 2.4

## 2014-02-27 NOTE — Progress Notes (Signed)
Pt notified and voiced understanding 

## 2014-03-12 DIAGNOSIS — M999 Biomechanical lesion, unspecified: Secondary | ICD-10-CM | POA: Diagnosis not present

## 2014-03-12 DIAGNOSIS — S139XXA Sprain of joints and ligaments of unspecified parts of neck, initial encounter: Secondary | ICD-10-CM | POA: Diagnosis not present

## 2014-03-12 DIAGNOSIS — M9981 Other biomechanical lesions of cervical region: Secondary | ICD-10-CM | POA: Diagnosis not present

## 2014-03-13 ENCOUNTER — Ambulatory Visit (INDEPENDENT_AMBULATORY_CARE_PROVIDER_SITE_OTHER): Payer: Medicare Other | Admitting: Family Medicine

## 2014-03-13 DIAGNOSIS — I2699 Other pulmonary embolism without acute cor pulmonale: Secondary | ICD-10-CM

## 2014-03-13 LAB — POCT INR: INR: 2.1

## 2014-03-13 NOTE — Progress Notes (Signed)
   Subjective:    Patient ID: [name], male    DOB: [birth year], 73 y.o.   MRN: [id number] Pt notified of dosing instructions & to recheck in 3 weeks. Beatris Ship, CMA HPI    Review of Systems     Objective:   Physical Exam        Assessment & Plan:

## 2014-03-15 DIAGNOSIS — D235 Other benign neoplasm of skin of trunk: Secondary | ICD-10-CM | POA: Diagnosis not present

## 2014-03-15 DIAGNOSIS — D485 Neoplasm of uncertain behavior of skin: Secondary | ICD-10-CM | POA: Diagnosis not present

## 2014-03-15 DIAGNOSIS — L570 Actinic keratosis: Secondary | ICD-10-CM | POA: Diagnosis not present

## 2014-03-15 DIAGNOSIS — L821 Other seborrheic keratosis: Secondary | ICD-10-CM | POA: Diagnosis not present

## 2014-03-26 ENCOUNTER — Ambulatory Visit (INDEPENDENT_AMBULATORY_CARE_PROVIDER_SITE_OTHER): Payer: Medicare Other | Admitting: Family Medicine

## 2014-03-26 ENCOUNTER — Encounter: Payer: Self-pay | Admitting: *Deleted

## 2014-03-26 VITALS — BP 115/68 | HR 79

## 2014-03-26 DIAGNOSIS — I2699 Other pulmonary embolism without acute cor pulmonale: Secondary | ICD-10-CM

## 2014-03-26 LAB — POCT INR: INR: 3

## 2014-03-26 NOTE — Progress Notes (Signed)
   Subjective:    Patient ID: [name], male    DOB: [birth year], 73 y.o.   MRN: [id number] Pt notified of dosing instructions. Beatris Ship, CMA HPI    Review of Systems     Objective:   Physical Exam        Assessment & Plan:

## 2014-03-27 DIAGNOSIS — I059 Rheumatic mitral valve disease, unspecified: Secondary | ICD-10-CM | POA: Diagnosis not present

## 2014-03-27 DIAGNOSIS — I2699 Other pulmonary embolism without acute cor pulmonale: Secondary | ICD-10-CM | POA: Diagnosis not present

## 2014-03-27 DIAGNOSIS — I428 Other cardiomyopathies: Secondary | ICD-10-CM | POA: Diagnosis not present

## 2014-03-27 DIAGNOSIS — R0602 Shortness of breath: Secondary | ICD-10-CM | POA: Diagnosis not present

## 2014-03-27 DIAGNOSIS — I4949 Other premature depolarization: Secondary | ICD-10-CM | POA: Diagnosis not present

## 2014-03-27 DIAGNOSIS — I2510 Atherosclerotic heart disease of native coronary artery without angina pectoris: Secondary | ICD-10-CM | POA: Diagnosis not present

## 2014-03-28 DIAGNOSIS — M999 Biomechanical lesion, unspecified: Secondary | ICD-10-CM | POA: Diagnosis not present

## 2014-03-28 DIAGNOSIS — M9981 Other biomechanical lesions of cervical region: Secondary | ICD-10-CM | POA: Diagnosis not present

## 2014-03-28 DIAGNOSIS — S139XXA Sprain of joints and ligaments of unspecified parts of neck, initial encounter: Secondary | ICD-10-CM | POA: Diagnosis not present

## 2014-04-03 DIAGNOSIS — M999 Biomechanical lesion, unspecified: Secondary | ICD-10-CM | POA: Diagnosis not present

## 2014-04-03 DIAGNOSIS — M9981 Other biomechanical lesions of cervical region: Secondary | ICD-10-CM | POA: Diagnosis not present

## 2014-04-03 DIAGNOSIS — S139XXA Sprain of joints and ligaments of unspecified parts of neck, initial encounter: Secondary | ICD-10-CM | POA: Diagnosis not present

## 2014-04-09 ENCOUNTER — Ambulatory Visit (INDEPENDENT_AMBULATORY_CARE_PROVIDER_SITE_OTHER): Payer: Medicare Other | Admitting: Family Medicine

## 2014-04-09 ENCOUNTER — Encounter: Payer: Self-pay | Admitting: *Deleted

## 2014-04-09 ENCOUNTER — Encounter: Payer: Self-pay | Admitting: Family Medicine

## 2014-04-09 VITALS — BP 107/58 | HR 87

## 2014-04-09 DIAGNOSIS — IMO0001 Reserved for inherently not codable concepts without codable children: Secondary | ICD-10-CM | POA: Insufficient documentation

## 2014-04-09 DIAGNOSIS — I2699 Other pulmonary embolism without acute cor pulmonale: Secondary | ICD-10-CM

## 2014-04-09 DIAGNOSIS — I5031 Acute diastolic (congestive) heart failure: Secondary | ICD-10-CM | POA: Diagnosis not present

## 2014-04-09 DIAGNOSIS — I079 Rheumatic tricuspid valve disease, unspecified: Secondary | ICD-10-CM | POA: Diagnosis not present

## 2014-04-09 DIAGNOSIS — I059 Rheumatic mitral valve disease, unspecified: Secondary | ICD-10-CM | POA: Diagnosis not present

## 2014-04-09 LAB — POCT INR: INR: 2.4

## 2014-04-09 NOTE — Progress Notes (Signed)
   Subjective:    Patient ID: [name], male    DOB: 15-Aug-1941, 73 y.o.   MRN: [id number] Pt notified to stay on same dosing schedule & to recheck INR in 3 weeks. Beatris Ship, CMA HPI    Review of Systems     Objective:   Physical Exam        Assessment & Plan:

## 2014-04-11 ENCOUNTER — Other Ambulatory Visit: Payer: Self-pay | Admitting: Sports Medicine

## 2014-04-16 DIAGNOSIS — I059 Rheumatic mitral valve disease, unspecified: Secondary | ICD-10-CM | POA: Diagnosis not present

## 2014-04-16 DIAGNOSIS — I428 Other cardiomyopathies: Secondary | ICD-10-CM | POA: Diagnosis not present

## 2014-04-16 DIAGNOSIS — I2510 Atherosclerotic heart disease of native coronary artery without angina pectoris: Secondary | ICD-10-CM | POA: Diagnosis not present

## 2014-04-16 DIAGNOSIS — I4949 Other premature depolarization: Secondary | ICD-10-CM | POA: Diagnosis not present

## 2014-04-17 ENCOUNTER — Other Ambulatory Visit: Payer: Self-pay | Admitting: Family Medicine

## 2014-04-17 DIAGNOSIS — I2510 Atherosclerotic heart disease of native coronary artery without angina pectoris: Secondary | ICD-10-CM | POA: Diagnosis not present

## 2014-04-17 DIAGNOSIS — I059 Rheumatic mitral valve disease, unspecified: Secondary | ICD-10-CM | POA: Diagnosis not present

## 2014-04-17 DIAGNOSIS — I428 Other cardiomyopathies: Secondary | ICD-10-CM | POA: Diagnosis not present

## 2014-04-17 DIAGNOSIS — I4949 Other premature depolarization: Secondary | ICD-10-CM | POA: Diagnosis not present

## 2014-04-20 ENCOUNTER — Encounter: Payer: Self-pay | Admitting: *Deleted

## 2014-04-20 ENCOUNTER — Ambulatory Visit (INDEPENDENT_AMBULATORY_CARE_PROVIDER_SITE_OTHER): Payer: Medicare Other | Admitting: Family Medicine

## 2014-04-20 VITALS — BP 117/60 | HR 72

## 2014-04-20 DIAGNOSIS — I2699 Other pulmonary embolism without acute cor pulmonale: Secondary | ICD-10-CM

## 2014-04-20 LAB — POCT INR: INR: 1.2

## 2014-04-20 NOTE — Progress Notes (Signed)
   Subjective:    Patient ID: [name], male    DOB: 1941-11-15, 73 y.o.   MRN: [id number] Left detailed message on vm notifying pt of new dosing instructions. Beatris Ship, CMA HPI    Review of Systems     Objective:   Physical Exam        Assessment & Plan:

## 2014-04-30 ENCOUNTER — Encounter: Payer: Self-pay | Admitting: Family Medicine

## 2014-05-01 ENCOUNTER — Ambulatory Visit (INDEPENDENT_AMBULATORY_CARE_PROVIDER_SITE_OTHER): Payer: Medicare Other | Admitting: Family Medicine

## 2014-05-01 VITALS — BP 99/55 | HR 71 | Ht 71.0 in | Wt 194.0 lb

## 2014-05-01 DIAGNOSIS — I2699 Other pulmonary embolism without acute cor pulmonale: Secondary | ICD-10-CM | POA: Diagnosis not present

## 2014-05-01 DIAGNOSIS — I2510 Atherosclerotic heart disease of native coronary artery without angina pectoris: Secondary | ICD-10-CM | POA: Diagnosis not present

## 2014-05-01 DIAGNOSIS — I428 Other cardiomyopathies: Secondary | ICD-10-CM | POA: Diagnosis not present

## 2014-05-01 DIAGNOSIS — R0602 Shortness of breath: Secondary | ICD-10-CM | POA: Diagnosis not present

## 2014-05-01 DIAGNOSIS — I059 Rheumatic mitral valve disease, unspecified: Secondary | ICD-10-CM | POA: Diagnosis not present

## 2014-05-01 LAB — POCT INR: INR: 2.3

## 2014-05-01 NOTE — Progress Notes (Signed)
Patient was in office for Coumadin check. INR was 2.3. Patient denied anything abnormal. Oneta Rack

## 2014-05-07 DIAGNOSIS — R059 Cough, unspecified: Secondary | ICD-10-CM | POA: Diagnosis not present

## 2014-05-07 DIAGNOSIS — J029 Acute pharyngitis, unspecified: Secondary | ICD-10-CM | POA: Diagnosis not present

## 2014-05-07 DIAGNOSIS — I2510 Atherosclerotic heart disease of native coronary artery without angina pectoris: Secondary | ICD-10-CM | POA: Diagnosis not present

## 2014-05-07 DIAGNOSIS — R05 Cough: Secondary | ICD-10-CM | POA: Diagnosis not present

## 2014-05-07 DIAGNOSIS — J309 Allergic rhinitis, unspecified: Secondary | ICD-10-CM | POA: Diagnosis not present

## 2014-05-08 ENCOUNTER — Ambulatory Visit (INDEPENDENT_AMBULATORY_CARE_PROVIDER_SITE_OTHER): Payer: Medicare Other | Admitting: Family Medicine

## 2014-05-08 ENCOUNTER — Encounter: Payer: Self-pay | Admitting: Family Medicine

## 2014-05-08 VITALS — BP 125/52 | HR 98 | Temp 97.4°F | Wt 192.0 lb

## 2014-05-08 DIAGNOSIS — J309 Allergic rhinitis, unspecified: Secondary | ICD-10-CM

## 2014-05-08 MED ORDER — HYDROCODONE-HOMATROPINE 5-1.5 MG/5ML PO SYRP: 5.0000 mL | ORAL_SOLUTION | Freq: Every evening | ORAL | Status: DC | PRN

## 2014-05-08 NOTE — Progress Notes (Signed)
   Subjective:    Patient ID: [name], male    DOB: 1941/07/17, 73 y.o.   MRN: [id number]  HPI Dry cough x 1 week and nasal drainage. Given flonase and prednisone yesterday at [facility]. Has been waking up in the AM with ST but that is actually better. + runny nose, + watery eyes.  Feeling a pressure in his ears.  No fever, chills or sweats.  Just feels bad or tired. No significant congestion.  No GI symptoms. History of nasal sinus surgery   Review of Systems     Objective:   Physical Exam  Constitutional: He is oriented to person, place, and time. He appears well-developed and well-nourished.  HENT:  Head: Normocephalic and atraumatic.  Right Ear: External ear normal.  Left Ear: External ear normal.  Nose: Nose normal.  Mouth/Throat: Oropharynx is clear and moist.  Eyes: Conjunctivae and EOM are normal. Pupils are equal, round, and reactive to light.  Neck: Normal range of motion. Neck supple. No thyromegaly present.  Cardiovascular: Normal rate, regular rhythm, normal heart sounds and intact distal pulses.   Pulmonary/Chest: Effort normal and breath sounds normal.  Abdominal: Soft. Bowel sounds are normal. He exhibits no distension and no mass. There is no tenderness. There is no rebound and no guarding.  Musculoskeletal: Normal range of motion.  Lymphadenopathy:    He has no cervical adenopathy.  Neurological: He is alert and oriented to person, place, and time. He has normal reflexes.  Skin: Skin is warm and dry.  Psychiatric: He has a normal mood and affect. His behavior is normal. Judgment and thought content normal.          Assessment & Plan:  Allergic rhinitsi- Will add an antihistamine to his regimen. Complete the prednisone and continue nasal steroid spray. Explained to him that a nasal steroid spray can take 25 days actually start working and that he should not notice immediate relief. So the patient with that. If he is not noticing some improvement in his  symptoms within 48 hours then please give [place] a call back and consider that he could have an underlying bacterial sinus infection even there he does have significant congestion. He does have a prior history of nasal sinus surgery he has had symptoms for week at this point.

## 2014-05-15 ENCOUNTER — Ambulatory Visit (INDEPENDENT_AMBULATORY_CARE_PROVIDER_SITE_OTHER): Payer: Medicare Other | Admitting: Family Medicine

## 2014-05-15 DIAGNOSIS — I2699 Other pulmonary embolism without acute cor pulmonale: Secondary | ICD-10-CM

## 2014-05-15 LAB — POCT INR: INR: 1.9

## 2014-05-15 NOTE — Progress Notes (Signed)
Pt notified. And pt was taking 2.5 not 5 so the directions Dr. Madilyn Fireman gave are still applicable in order to increase his dose

## 2014-05-15 NOTE — Progress Notes (Signed)
   Subjective:    Patient ID: [name], male    DOB: [birth year], 73 y.o.   MRN: [id number]  HPI   INR check BP 96/54 p 65 Review of Systems     Objective:   Physical Exam        Assessment & Plan:

## 2014-05-17 DIAGNOSIS — I2510 Atherosclerotic heart disease of native coronary artery without angina pectoris: Secondary | ICD-10-CM | POA: Diagnosis not present

## 2014-05-17 DIAGNOSIS — R05 Cough: Secondary | ICD-10-CM | POA: Diagnosis not present

## 2014-05-17 DIAGNOSIS — J40 Bronchitis, not specified as acute or chronic: Secondary | ICD-10-CM | POA: Diagnosis not present

## 2014-05-17 DIAGNOSIS — R059 Cough, unspecified: Secondary | ICD-10-CM | POA: Diagnosis not present

## 2014-05-17 DIAGNOSIS — J984 Other disorders of lung: Secondary | ICD-10-CM | POA: Diagnosis not present

## 2014-05-21 ENCOUNTER — Encounter: Payer: Self-pay | Admitting: Family Medicine

## 2014-05-21 ENCOUNTER — Ambulatory Visit (INDEPENDENT_AMBULATORY_CARE_PROVIDER_SITE_OTHER): Payer: Medicare Other | Admitting: Family Medicine

## 2014-05-21 VITALS — BP 99/50 | HR 69 | Wt 193.0 lb

## 2014-05-21 DIAGNOSIS — R0789 Other chest pain: Secondary | ICD-10-CM | POA: Diagnosis not present

## 2014-05-21 DIAGNOSIS — R000 Tachycardia, unspecified: Secondary | ICD-10-CM | POA: Diagnosis not present

## 2014-05-21 DIAGNOSIS — I2510 Atherosclerotic heart disease of native coronary artery without angina pectoris: Secondary | ICD-10-CM | POA: Diagnosis not present

## 2014-05-21 DIAGNOSIS — E7800 Pure hypercholesterolemia, unspecified: Secondary | ICD-10-CM | POA: Diagnosis not present

## 2014-05-21 DIAGNOSIS — I2699 Other pulmonary embolism without acute cor pulmonale: Secondary | ICD-10-CM

## 2014-05-21 NOTE — Progress Notes (Signed)
   Subjective:    Patient ID: [name], male    DOB: 1941-12-08, 73 y.o.   MRN: [id number]  HPI Went to [place] for bronchitis- Was given Augmentin, steroids and cough medication.   He is feeling better overall.  Finishing out the antibiotic.    Wants to review cardiac cath report.  Wanted to see if we got a copy.   Hyperlipidemia - says the simvastatin causes some muscle aches but not as bad as the lipitor.   Felt a "pinching" sensation in his chest.  Happened after eating. Last only a minute or so.  Happened again about 1.5 hours later.  Hasn't happened since. . No GERD. No diaphoresis or radiation of pain. No SOB with it.     Review of Systems     Objective:   Physical Exam  Constitutional: He is oriented to person, place, and time. He appears well-developed and well-nourished.  HENT:  Head: Normocephalic and atraumatic.  Cardiovascular: Normal rate and normal heart sounds.   Irregular rhythm   Pulmonary/Chest: Effort normal and breath sounds normal.  Neurological: He is alert and oriented to person, place, and time.  Skin: Skin is warm and dry.  Psychiatric: He has a normal mood and affect. His behavior is normal.          Assessment & Plan:  Hyperlipidemia-discussed different options we could certainly try a different statin such as Crestor or Livalo but these are both branded medications. It certainly would be reasonable.  Atypical chest pain-most likely esophagitis/gastritis from the prednisone he's been taking for his bronchitis, but because of his known coronary artery disease recommend EKG today just to make sure there are no changes.  EKG rate of 65 bpm, irregular rhymtm with a normal axis.  PVC and PACxs.    Acute bronchitis-resolving. Chest exam is normal today. Co and prednisone.mplete antibiotic  PE- should be able to get off coumdin in June, will have been 6 months. He is asymptomatic. Will need to go back on 325mg  of ASA once off coumadin. Asked him  to f/u with his surgeron this month and make sure everything is ok before we stop the coumadin   PROCEDURE: Coronary angiography  PREPROCEDURE DIAGNOSIS: Cardiomyopathy  POSTPROCEDURE DIAGNOSIS: Nonobstructive coronary artery disease  DESCRIPTION OF PROCEDURE: After informed consent was obtained, access was obtained in the right radial artery and a glide sheath was placed. Patient was systemically anticoagulated with unfractionated heparin. Additionally, verapamil was given through  the sheath to prevent artery spasm. Multiple angiographic projections were taken of the coronary anatomy. At the end of the case a TR band was placed on the right wrist for hemostasis.  FINDINGS:  1. Left Main: Dual ostia of the LAD and circumflex  2. LAD: There is a 30% proximal and mid stenosis of the LAD. The first diagonal has a 40% ostial stenosis. The second diagonal is patent.  3. Circumflex: The circumflex is large and dominant. There is a large first obtuse marginal artery with a 30% stenosis. The second obtuse marginal is widely patent.  4. Right Coronary Artery: Small and nondominant. Patent.  5. Hemodynamics: Aortic pressure is 124/56 mmHg  LV pressure is 134/70 mmHg.  CONCLUSIONS:  1. Mild nonobstructive coronary artery disease  2. He will restart his Coumadin tonight. He will followup with Dr. Mauricio Po in 2 weeks.

## 2014-05-29 ENCOUNTER — Telehealth: Payer: Self-pay | Admitting: *Deleted

## 2014-05-29 ENCOUNTER — Ambulatory Visit (INDEPENDENT_AMBULATORY_CARE_PROVIDER_SITE_OTHER): Payer: Medicare Other | Admitting: Family Medicine

## 2014-05-29 DIAGNOSIS — I2699 Other pulmonary embolism without acute cor pulmonale: Secondary | ICD-10-CM | POA: Diagnosis not present

## 2014-05-29 LAB — POCT INR: INR: 1.8

## 2014-05-29 NOTE — Telephone Encounter (Signed)
Left coumadin instructions on home vm

## 2014-06-05 DIAGNOSIS — L820 Inflamed seborrheic keratosis: Secondary | ICD-10-CM | POA: Diagnosis not present

## 2014-06-05 DIAGNOSIS — L821 Other seborrheic keratosis: Secondary | ICD-10-CM | POA: Diagnosis not present

## 2014-06-05 DIAGNOSIS — Z9889 Other specified postprocedural states: Secondary | ICD-10-CM | POA: Diagnosis not present

## 2014-06-05 DIAGNOSIS — L570 Actinic keratosis: Secondary | ICD-10-CM | POA: Diagnosis not present

## 2014-06-05 DIAGNOSIS — I2510 Atherosclerotic heart disease of native coronary artery without angina pectoris: Secondary | ICD-10-CM | POA: Diagnosis not present

## 2014-06-12 ENCOUNTER — Other Ambulatory Visit: Payer: Self-pay | Admitting: Family Medicine

## 2014-06-12 ENCOUNTER — Ambulatory Visit (INDEPENDENT_AMBULATORY_CARE_PROVIDER_SITE_OTHER): Payer: Medicare Other | Admitting: Family Medicine

## 2014-06-12 VITALS — BP 121/72 | HR 42 | Ht 71.0 in | Wt 194.0 lb

## 2014-06-12 DIAGNOSIS — I2699 Other pulmonary embolism without acute cor pulmonale: Secondary | ICD-10-CM

## 2014-06-12 LAB — PROTIME-INR
INR: 3.28 — AB (ref <1.50)
PROTHROMBIN TIME: 31.9 s — AB (ref 11.6–15.2)

## 2014-06-12 LAB — POCT INR: INR: 5.1

## 2014-06-12 NOTE — Progress Notes (Signed)
   Subjective:    Patient ID: [name], male    DOB: [birth year], 73 y.o.   MRN: [id number]  HPI    Review of Systems     Objective:   Physical Exam        Assessment & Plan:  He is actually been told by his surgeon that he can stop his Coumadin. His last dose was yesterday.

## 2014-07-19 DIAGNOSIS — Z471 Aftercare following joint replacement surgery: Secondary | ICD-10-CM | POA: Diagnosis not present

## 2014-07-19 DIAGNOSIS — M1610 Unilateral primary osteoarthritis, unspecified hip: Secondary | ICD-10-CM | POA: Diagnosis not present

## 2014-07-19 DIAGNOSIS — Z96649 Presence of unspecified artificial hip joint: Secondary | ICD-10-CM | POA: Diagnosis not present

## 2014-07-31 ENCOUNTER — Other Ambulatory Visit: Payer: Self-pay | Admitting: Family Medicine

## 2014-08-02 DIAGNOSIS — I428 Other cardiomyopathies: Secondary | ICD-10-CM | POA: Diagnosis not present

## 2014-08-02 DIAGNOSIS — I2510 Atherosclerotic heart disease of native coronary artery without angina pectoris: Secondary | ICD-10-CM | POA: Diagnosis not present

## 2014-08-02 DIAGNOSIS — I059 Rheumatic mitral valve disease, unspecified: Secondary | ICD-10-CM | POA: Diagnosis not present

## 2014-08-02 DIAGNOSIS — I2699 Other pulmonary embolism without acute cor pulmonale: Secondary | ICD-10-CM | POA: Diagnosis not present

## 2014-08-08 ENCOUNTER — Other Ambulatory Visit: Payer: Self-pay | Admitting: *Deleted

## 2014-08-08 MED ORDER — LEVOTHYROXINE SODIUM 75 MCG PO TABS: ORAL_TABLET | ORAL | Status: DC

## 2014-08-30 ENCOUNTER — Ambulatory Visit (INDEPENDENT_AMBULATORY_CARE_PROVIDER_SITE_OTHER): Payer: Medicare Other | Admitting: Family Medicine

## 2014-08-30 ENCOUNTER — Encounter: Payer: Self-pay | Admitting: Family Medicine

## 2014-08-30 VITALS — BP 133/68 | HR 53 | Temp 98.2°F | Wt 197.0 lb

## 2014-08-30 DIAGNOSIS — I2510 Atherosclerotic heart disease of native coronary artery without angina pectoris: Secondary | ICD-10-CM

## 2014-08-30 DIAGNOSIS — R1310 Dysphagia, unspecified: Secondary | ICD-10-CM | POA: Diagnosis not present

## 2014-08-30 NOTE — Progress Notes (Signed)
CC: [name] is a 73 y.o. male is here for Dysphagia   Subjective: HPI:  Complaint of difficulty with swallowing only when he is swallowing saliva. He further describes this as consciously having to remember to fully swallow and if he does not he will have remaining saliva in the back of his throat when he takes a deep breath. This is been going on for last 2 or 3 weeks it has never occurred during his sleep or with any liquids or solids. He denies any awakening from coughing, chest pain, or shortness of breath. Seems to occur on a daily basis now slowly worsening since onset.  He denies any regurgitation, food getting caught in his throat, nor swelling/lump in his throat. Since the symptoms above began he's also had a scratch in the back of his lower throat, some mild hoarseness at the end of the day at all he can appreciate, and throughout the day has been clearing his throat more than he used to. Denies fevers, chills, nasal congestion, facial pain, sneezing, itchy eyes, dry mouth, dry eyes nor any other motor or sensory disturbances.   Review Of Systems Outlined In HPI  Past Medical History  Diagnosis Date  . Insulin resistance   . LV dysfunction     reduction  . Osteoarthritis   . Hearing aid worn   . Abdominal wall defect, acquired   . Blockage of coronary artery of heart     30%    Past Surgical History  Procedure Laterality Date  . Acne cyst removal  2012    Back   . Appendectomy    . Tonsillectomy    . Cardiac catheterization    . Rt hip replacement    . Colectomy  11/2013    [facility], 2nd to diverticulitis   Family History  Problem Relation Age of Onset  . Heart disease Mother     pacemaker    History   Social History  . Marital Status: Married    Spouse Name: [name]    Number of Children: [name]  . Years of Education: [name]   Occupational History  . 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 Smoking status: Former  Smoker    Quit date: 12/21/1985  . Smokeless tobacco: Not on file  . Alcohol Use: No  . Drug Use: No  . 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 Not on file   Social History Narrative   Born in [place].      Objective: BP 133/68  Pulse 53  Temp(Src) 98.2 F (36.8 C) (Oral)  Wt 197 lb (89.359 kg)  General: Alert and Oriented, No Acute Distress HEENT: Pupils equal, round, reactive to light. Conjunctivae clear.  External ears unremarkable, canals clear with intact TMs with appropriate landmarks.  Middle ear appears open without effusion. Pink inferior turbinates.  Moist mucous membranes, pharynx without inflammation nor lesions however mild post nasal drip.  Neck supple without palpable lymphadenopathy nor abnormal masses. Lungs: Clear to auscultation bilaterally, no wheezing/ronchi/rales.  Comfortable work of breathing. Good air movement. Cardiac: Regular rate and rhythm. Normal S1/S2.  No murmurs, rubs, nor gallops.   Neuro: CN II-XII grossly intact, full strength/rom of all four extremities, C5/L4/S1 DTRs 2/4 bilaterally, gait normal   Assessment & Plan: [name] was seen today for dysphagia.  Diagnoses  and associated orders for this visit:  Dysphagia, unspecified(787.20)    Dysphagia most likely due to postnasal drip resulting in inflammation of the lower pharynx and larynx. Start over-the-counter Flonase and if no better by next week please call me and I will refer to ear nose and throat. I've encouraged him to continue on daily omeprazole for continued prevention of his GERD.   Return if symptoms worsen or fail to improve.

## 2014-09-11 DIAGNOSIS — L578 Other skin changes due to chronic exposure to nonionizing radiation: Secondary | ICD-10-CM | POA: Diagnosis not present

## 2014-09-11 DIAGNOSIS — L570 Actinic keratosis: Secondary | ICD-10-CM | POA: Diagnosis not present

## 2014-09-22 IMAGING — CR DG HIP (WITH OR WITHOUT PELVIS) 2-3V*L*
3 series · 3 of 3 positions shown · non-contrast
Comparison: None

CLINICAL DATA: Left hip pain, history of right hip replacement in
6882

LEFT HIP - COMPLETE 2+ VIEW

[view not recorded (1 of 3)]
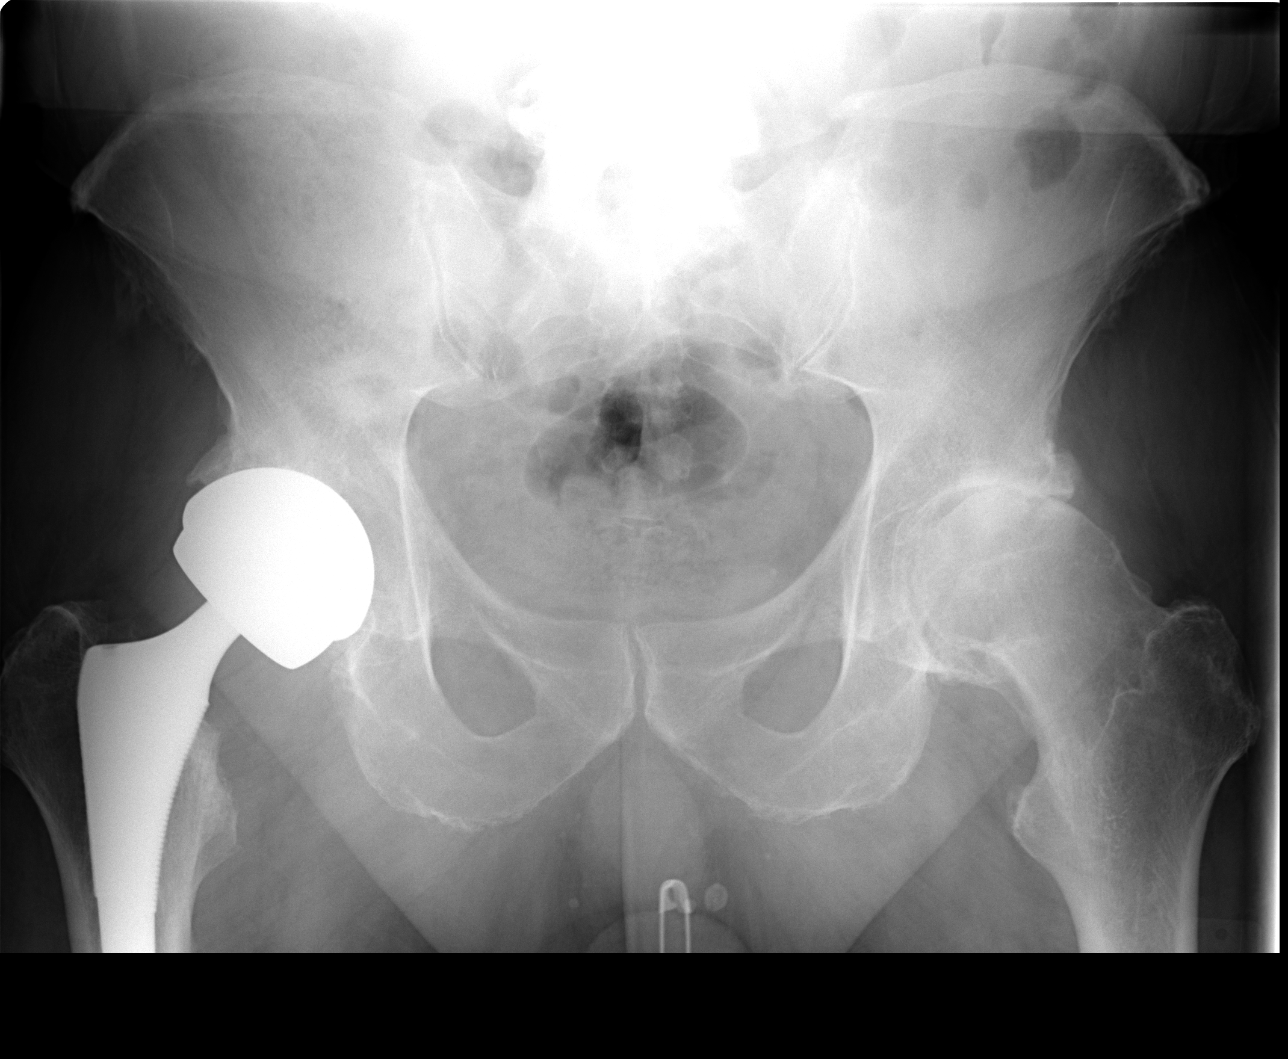

[view not recorded (2 of 3)]
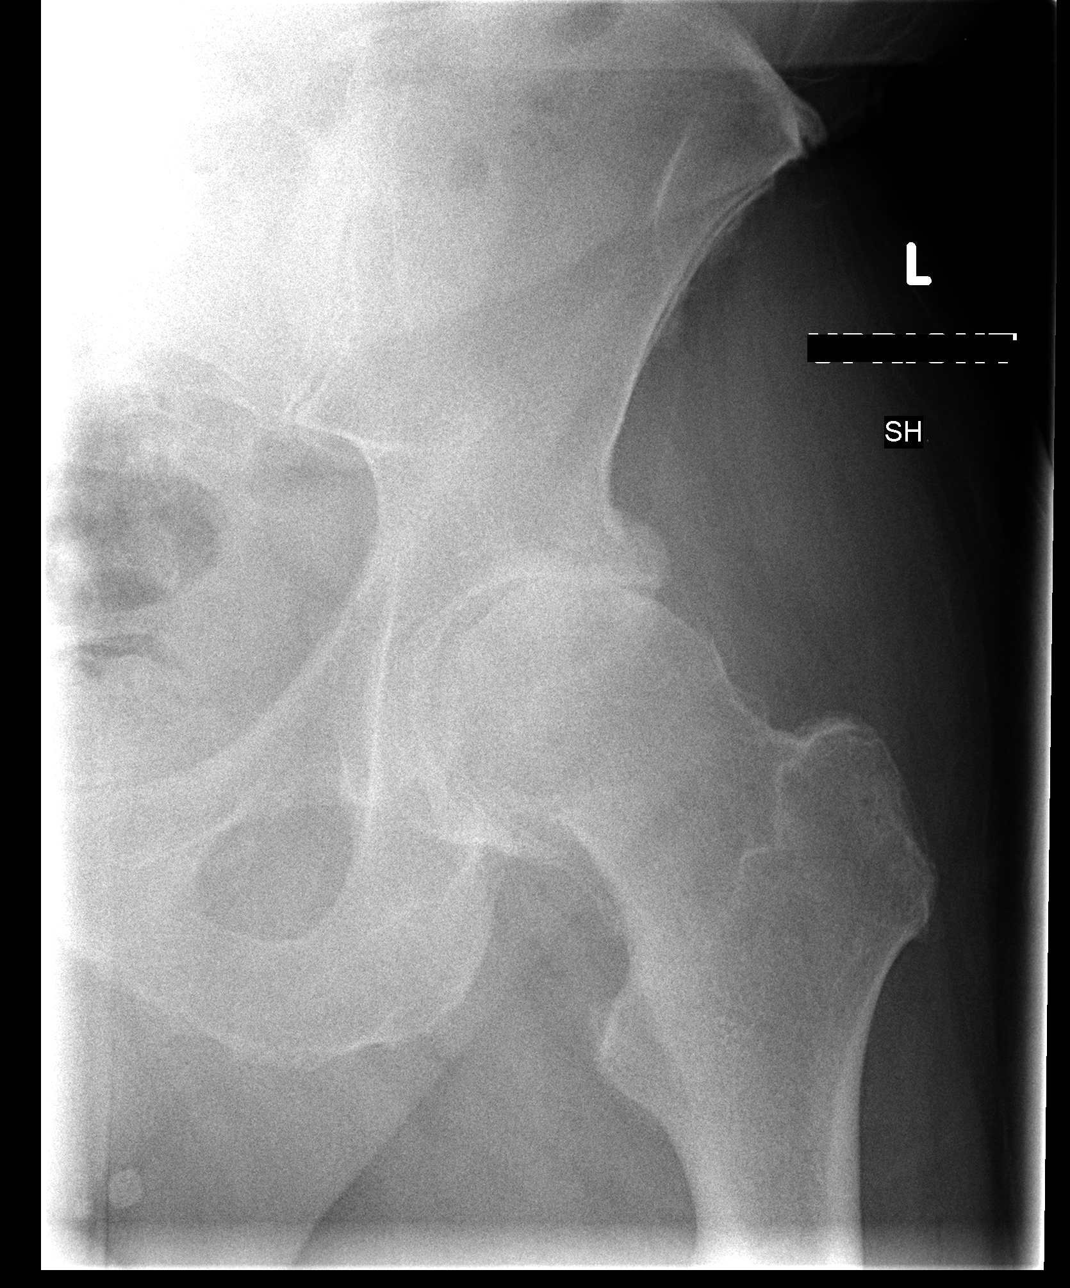

[view not recorded (3 of 3)]
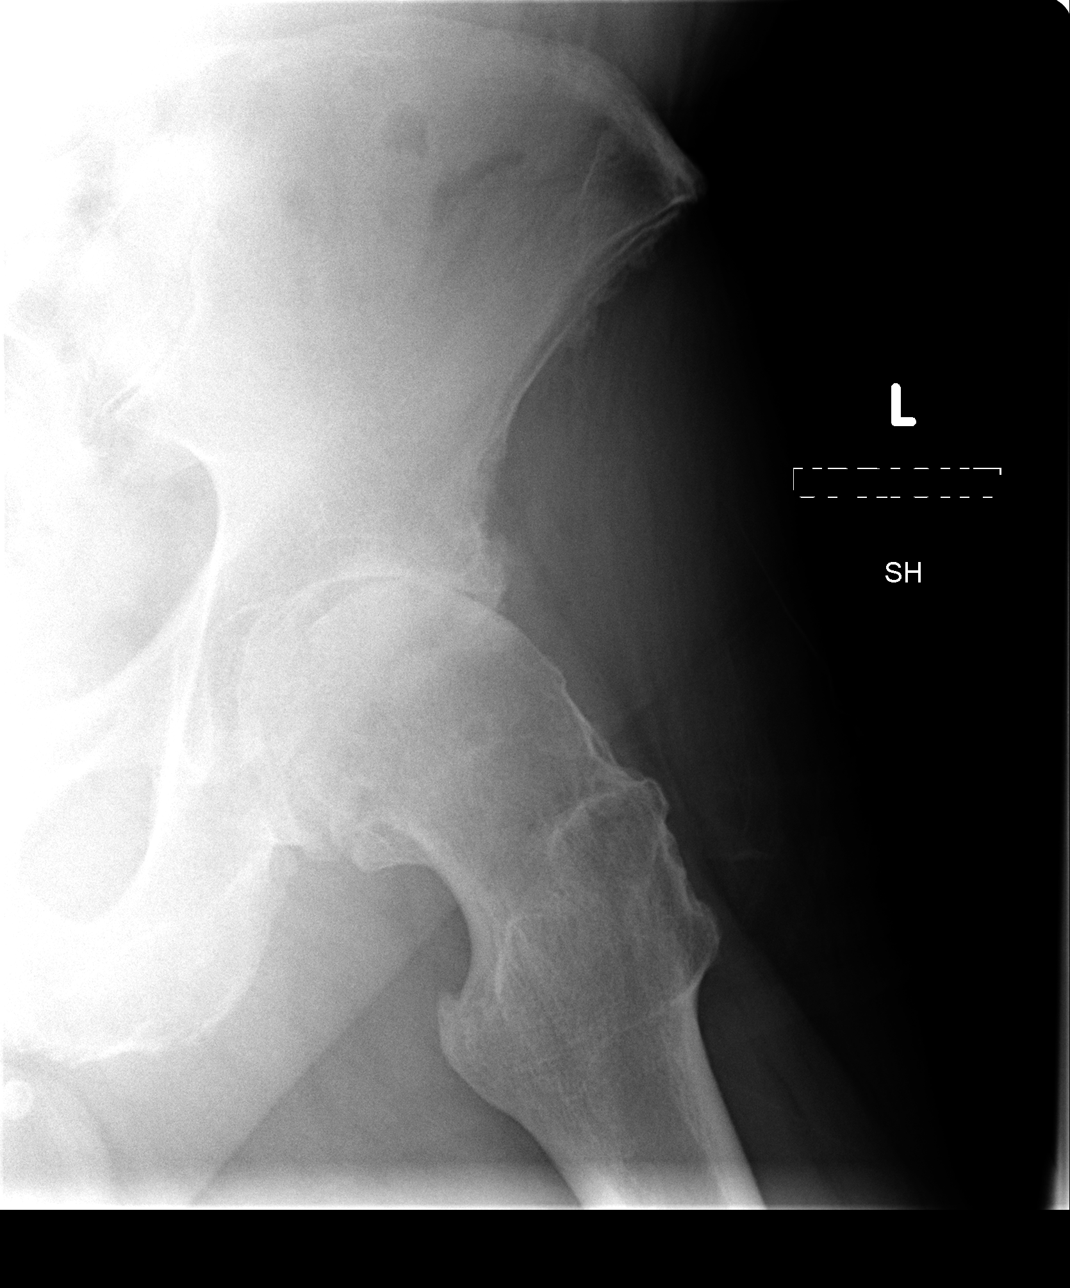

[3 of 3 positions shown; findings below may reference images not displayed]

FINDINGS: The femoral and acetabular components of the right hip
replacement appear to be in good position.  There is significant
degenerative joint disease of the left hip with loss of joint
space, sclerosis, and subchondral cyst formation within the left
femoral head and superior left acetabulum.  No acute abnormality is
seen.  The pelvic rami are intact.  There are degenerative changes
also noted in the lower lumbar spine.
IMPRESSION: 1.  Significant degenerative joint disease of the left hip.

2.  Right total hip replacement in good position.

3.  Degenerative change is also noted in the lower lumbar spine.

## 2014-10-22 DIAGNOSIS — L570 Actinic keratosis: Secondary | ICD-10-CM | POA: Diagnosis not present

## 2014-10-25 DIAGNOSIS — M1990 Unspecified osteoarthritis, unspecified site: Secondary | ICD-10-CM | POA: Diagnosis not present

## 2014-10-25 DIAGNOSIS — I2510 Atherosclerotic heart disease of native coronary artery without angina pectoris: Secondary | ICD-10-CM | POA: Diagnosis not present

## 2014-10-31 ENCOUNTER — Telehealth: Payer: Self-pay | Admitting: Family Medicine

## 2014-10-31 NOTE — Telephone Encounter (Signed)
Left message for patient's need for a surgical clearance. Scheduled on 11/09/2014 at 2:35 pm.

## 2014-10-31 NOTE — Telephone Encounter (Signed)
Call pt: needs a surgical clearance visit for left hip.  30 min appt

## 2014-11-07 NOTE — Telephone Encounter (Signed)
Left message for patient to call back  

## 2014-11-08 ENCOUNTER — Telehealth: Payer: Self-pay | Admitting: *Deleted

## 2014-11-08 NOTE — Telephone Encounter (Signed)
Called pt to inform him of his pre-op clearance appt that [facility] scheduled for him on tomorrow 11/09/14 @ 230 pm. Pt reports that he is not having this surgery

## 2014-11-09 ENCOUNTER — Ambulatory Visit: Payer: Medicare Other | Admitting: Family Medicine

## 2014-11-13 DIAGNOSIS — L570 Actinic keratosis: Secondary | ICD-10-CM | POA: Diagnosis not present

## 2014-11-27 DIAGNOSIS — L570 Actinic keratosis: Secondary | ICD-10-CM | POA: Diagnosis not present

## 2014-12-24 ENCOUNTER — Encounter: Payer: Self-pay | Admitting: Family Medicine

## 2014-12-24 ENCOUNTER — Ambulatory Visit (INDEPENDENT_AMBULATORY_CARE_PROVIDER_SITE_OTHER): Payer: Medicare Other | Admitting: *Deleted

## 2014-12-24 ENCOUNTER — Ambulatory Visit (INDEPENDENT_AMBULATORY_CARE_PROVIDER_SITE_OTHER): Payer: Medicare Other | Admitting: Family Medicine

## 2014-12-24 VITALS — BP 128/82 | HR 69 | Temp 97.9°F | Wt 200.0 lb

## 2014-12-24 DIAGNOSIS — I2510 Atherosclerotic heart disease of native coronary artery without angina pectoris: Secondary | ICD-10-CM

## 2014-12-24 DIAGNOSIS — Z01818 Encounter for other preprocedural examination: Secondary | ICD-10-CM | POA: Diagnosis not present

## 2014-12-24 DIAGNOSIS — E7800 Pure hypercholesterolemia, unspecified: Secondary | ICD-10-CM

## 2014-12-24 DIAGNOSIS — E038 Other specified hypothyroidism: Secondary | ICD-10-CM | POA: Diagnosis not present

## 2014-12-24 DIAGNOSIS — Z23 Encounter for immunization: Secondary | ICD-10-CM

## 2014-12-24 NOTE — Progress Notes (Signed)
Subjective:    Patient ID: [name], male    DOB: 1941/01/12, 74 y.o.   MRN: [id number]  HPI Here for preoperative exam for left total hip arthroplasty to be done by Dr. Thersa Salt with nose on orthopedic and sports medicine [facility]. He is a 74 year old white male with a history of coronary artery disease, hypothyroidism, hypercholesterolemia, and prior history of pulmonary embolism. Completed Coumadin in June 2015. They did specifically request any labs and EKGs in the past year. He did have an EKG performed in June showing some early PVCs and PACs. With nonspecific T-wave abnormalities. Rate was 65 bpm. Last labs were from April for a basic metabolic panel.  He denies any recent chest pain, shortness of breath. He takes his medications regularly and is very compliant. He has had the previous hip replacement done several years ago and did well with this. He has not had any problems with the anesthesia.  Review of Systems  BP 158/82 mmHg  Pulse 69  Temp(Src) 97.9 F (36.6 C)  Wt 200 lb (90.719 kg)  SpO2 98%    Allergies  Allergen Reactions  . Flagyl [Metronidazole] Nausea And Vomiting  . Lipitor [Atorvastatin] Other (See Comments)    Myalgia    Past Medical History  Diagnosis Date  . Insulin resistance   . LV dysfunction     reduction  . Osteoarthritis   . Hearing aid worn   . Abdominal wall defect, acquired   . Blockage of coronary artery of heart     30%    Past Surgical History  Procedure Laterality Date  . Acne cyst removal  2012    Back   . Appendectomy    . Tonsillectomy    . Cardiac catheterization    . Rt hip replacement    . Colectomy  11/2013    [facility], 2nd to diverticulitis    History   Social History  . Marital Status: Married    Spouse Name: [name]    Number of Children: [name]  . Years of Education: [name]   Occupational History  . 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 Smoking status: Former  Smoker    Quit date: 12/21/1985  . Smokeless tobacco: Not on file  . Alcohol Use: No  . Drug Use: No  . 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 Not on file   Social History Narrative   Born in [place].     Family History  Problem Relation Age of Onset  . Heart disease Mother     pacemaker    Outpatient Encounter Prescriptions as of 12/24/2014  Medication Sig  . levothyroxine (SYNTHROID, LEVOTHROID) 75 MCG tablet Take 1 tablet by mouth  daily  . meloxicam (MOBIC) 15 MG tablet Take 1 tablet by mouth  every day as needed  . metoprolol (LOPRESSOR) 50 MG tablet Take 1 tablet (50 mg total) by mouth 2 (two) times daily.  . Multiple Vitamin (MULTIVITAMIN) tablet Take 1 tablet by mouth daily.    [name] omeprazole (PRILOSEC) 20 MG capsule Take 20 mg by mouth daily.    . simvastatin (ZOCOR) 40 MG tablet Take 40 mg by mouth.          Objective:   Physical Exam  Constitutional: He is oriented to person, place, and time. He appears  well-developed and well-nourished.  HENT:  Head: Normocephalic and atraumatic.  Right Ear: External ear normal.  Left Ear: External ear normal.  Nose: Nose normal.  Mouth/Throat: Oropharynx is clear and moist.  Eyes: Conjunctivae and EOM are normal. Pupils are equal, round, and reactive to light.  Neck: Normal range of motion. Neck supple. No thyromegaly present.  Cardiovascular: Normal rate, regular rhythm, normal heart sounds and intact distal pulses.   Pulmonary/Chest: Effort normal and breath sounds normal.  Abdominal: Soft. Bowel sounds are normal. He exhibits no distension and no mass. There is no tenderness. There is no rebound and no guarding.  Musculoskeletal: Normal range of motion.  Lymphadenopathy:    He has no cervical adenopathy.  Neurological: He is alert and oriented to person, place, and time. He has normal reflexes.  Skin: Skin is warm and dry.   Psychiatric: He has a normal mood and affect. His behavior is normal. Judgment and thought content normal.          Assessment & Plan:  Preoperative clearance for left total hip arthroplasty- cleared for surgery. Do recommend postoperative prophylaxis especially with prior history of pulmonary embolism which was felt to be secondary to inactivity last summer. EKG today shows rate of 76 bpm, with PVC and possible PAC. Unchanged from previous in June. Normal axis.   BP well controlled no recent CP or SOB.  Will need to avoid NSAIDs 1 week prior to surgery.  Hypothyroidism-due to recheck TSH. Lab slip provided. We'll call the results once available.  Coronary artery disease-stable. No recent symptoms. He follows regularly with Dr. Dominica Severin when all day. No recent changes to EKG. He is on a beta blocker and statin.  Given flu shot and Prevnar 13 today.

## 2014-12-25 LAB — COMPLETE METABOLIC PANEL WITH GFR
ALBUMIN: 4.4 g/dL (ref 3.5–5.2)
ALT: 27 U/L (ref 0–53)
AST: 28 U/L (ref 0–37)
Alkaline Phosphatase: 48 U/L (ref 39–117)
BUN: 16 mg/dL (ref 6–23)
CALCIUM: 9.3 mg/dL (ref 8.4–10.5)
CHLORIDE: 105 meq/L (ref 96–112)
CO2: 24 meq/L (ref 19–32)
CREATININE: 0.85 mg/dL (ref 0.50–1.35)
GFR, EST NON AFRICAN AMERICAN: 87 mL/min
GLUCOSE: 103 mg/dL — AB (ref 70–99)
POTASSIUM: 4.9 meq/L (ref 3.5–5.3)
Sodium: 140 mEq/L (ref 135–145)
Total Bilirubin: 0.8 mg/dL (ref 0.2–1.2)
Total Protein: 6.6 g/dL (ref 6.0–8.3)

## 2014-12-25 LAB — LIPID PANEL
CHOLESTEROL: 155 mg/dL (ref 0–200)
HDL: 43 mg/dL (ref >39)
LDL Cholesterol: 74 mg/dL (ref 0–99)
TRIGLYCERIDES: 188 mg/dL — AB (ref <150)
Total CHOL/HDL Ratio: 3.6 Ratio
VLDL: 38 mg/dL (ref 0–40)

## 2014-12-25 LAB — CBC WITH DIFFERENTIAL/PLATELET
BASOS PCT: 0 % (ref 0–1)
Basophils Absolute: 0 10*3/uL (ref 0.0–0.1)
EOS ABS: 0.2 10*3/uL (ref 0.0–0.7)
Eosinophils Relative: 3 % (ref 0–5)
HCT: 41.8 % (ref 39.0–52.0)
Hemoglobin: 14.5 g/dL (ref 13.0–17.0)
LYMPHS ABS: 1.2 10*3/uL (ref 0.7–4.0)
Lymphocytes Relative: 17 % (ref 12–46)
MCH: 30.1 pg (ref 26.0–34.0)
MCHC: 34.7 g/dL (ref 30.0–36.0)
MCV: 86.9 fL (ref 78.0–100.0)
MPV: 9.2 fL (ref 8.6–12.4)
Monocytes Absolute: 0.8 10*3/uL (ref 0.1–1.0)
Monocytes Relative: 11 % (ref 3–12)
NEUTROS PCT: 69 % (ref 43–77)
Neutro Abs: 4.9 10*3/uL (ref 1.7–7.7)
Platelets: 175 10*3/uL (ref 150–400)
RBC: 4.81 MIL/uL (ref 4.22–5.81)
RDW: 14.5 % (ref 11.5–15.5)
WBC: 7.1 10*3/uL (ref 4.0–10.5)

## 2014-12-25 LAB — TSH: TSH: 1.612 u[IU]/mL (ref 0.350–4.500)

## 2014-12-26 DIAGNOSIS — Z125 Encounter for screening for malignant neoplasm of prostate: Secondary | ICD-10-CM | POA: Diagnosis not present

## 2014-12-26 DIAGNOSIS — R399 Unspecified symptoms and signs involving the genitourinary system: Secondary | ICD-10-CM | POA: Diagnosis not present

## 2014-12-26 DIAGNOSIS — N400 Enlarged prostate without lower urinary tract symptoms: Secondary | ICD-10-CM | POA: Diagnosis not present

## 2015-01-09 DIAGNOSIS — E785 Hyperlipidemia, unspecified: Secondary | ICD-10-CM | POA: Diagnosis not present

## 2015-01-09 DIAGNOSIS — Z7982 Long term (current) use of aspirin: Secondary | ICD-10-CM | POA: Diagnosis not present

## 2015-01-09 DIAGNOSIS — Z791 Long term (current) use of non-steroidal anti-inflammatories (NSAID): Secondary | ICD-10-CM | POA: Diagnosis not present

## 2015-01-09 DIAGNOSIS — Z01812 Encounter for preprocedural laboratory examination: Secondary | ICD-10-CM | POA: Diagnosis not present

## 2015-01-09 DIAGNOSIS — Z87891 Personal history of nicotine dependence: Secondary | ICD-10-CM | POA: Diagnosis not present

## 2015-01-09 DIAGNOSIS — Z888 Allergy status to other drugs, medicaments and biological substances status: Secondary | ICD-10-CM | POA: Diagnosis not present

## 2015-01-09 DIAGNOSIS — I10 Essential (primary) hypertension: Secondary | ICD-10-CM | POA: Diagnosis not present

## 2015-01-09 DIAGNOSIS — K219 Gastro-esophageal reflux disease without esophagitis: Secondary | ICD-10-CM | POA: Diagnosis not present

## 2015-01-09 DIAGNOSIS — Z8673 Personal history of transient ischemic attack (TIA), and cerebral infarction without residual deficits: Secondary | ICD-10-CM | POA: Diagnosis not present

## 2015-01-09 DIAGNOSIS — I499 Cardiac arrhythmia, unspecified: Secondary | ICD-10-CM | POA: Diagnosis not present

## 2015-01-09 DIAGNOSIS — I2510 Atherosclerotic heart disease of native coronary artery without angina pectoris: Secondary | ICD-10-CM | POA: Diagnosis not present

## 2015-01-09 DIAGNOSIS — G709 Myoneural disorder, unspecified: Secondary | ICD-10-CM | POA: Diagnosis not present

## 2015-01-09 DIAGNOSIS — M1612 Unilateral primary osteoarthritis, left hip: Secondary | ICD-10-CM | POA: Diagnosis not present

## 2015-01-09 DIAGNOSIS — Z79899 Other long term (current) drug therapy: Secondary | ICD-10-CM | POA: Diagnosis not present

## 2015-01-09 DIAGNOSIS — Z7951 Long term (current) use of inhaled steroids: Secondary | ICD-10-CM | POA: Diagnosis not present

## 2015-01-09 DIAGNOSIS — I351 Nonrheumatic aortic (valve) insufficiency: Secondary | ICD-10-CM | POA: Diagnosis not present

## 2015-01-09 DIAGNOSIS — E039 Hypothyroidism, unspecified: Secondary | ICD-10-CM | POA: Diagnosis not present

## 2015-01-09 DIAGNOSIS — R0602 Shortness of breath: Secondary | ICD-10-CM | POA: Diagnosis not present

## 2015-01-09 DIAGNOSIS — M1990 Unspecified osteoarthritis, unspecified site: Secondary | ICD-10-CM | POA: Diagnosis not present

## 2015-01-17 DIAGNOSIS — Z01818 Encounter for other preprocedural examination: Secondary | ICD-10-CM | POA: Diagnosis not present

## 2015-01-25 ENCOUNTER — Other Ambulatory Visit: Payer: Self-pay | Admitting: Family Medicine

## 2015-01-29 DIAGNOSIS — Z86711 Personal history of pulmonary embolism: Secondary | ICD-10-CM | POA: Diagnosis not present

## 2015-01-29 DIAGNOSIS — M1990 Unspecified osteoarthritis, unspecified site: Secondary | ICD-10-CM | POA: Diagnosis not present

## 2015-01-29 DIAGNOSIS — Z79899 Other long term (current) drug therapy: Secondary | ICD-10-CM | POA: Diagnosis not present

## 2015-01-29 DIAGNOSIS — I2510 Atherosclerotic heart disease of native coronary artery without angina pectoris: Secondary | ICD-10-CM | POA: Diagnosis present

## 2015-01-29 DIAGNOSIS — E785 Hyperlipidemia, unspecified: Secondary | ICD-10-CM | POA: Diagnosis present

## 2015-01-29 DIAGNOSIS — I10 Essential (primary) hypertension: Secondary | ICD-10-CM | POA: Diagnosis present

## 2015-01-29 DIAGNOSIS — R279 Unspecified lack of coordination: Secondary | ICD-10-CM | POA: Diagnosis not present

## 2015-01-29 DIAGNOSIS — Z7982 Long term (current) use of aspirin: Secondary | ICD-10-CM | POA: Diagnosis not present

## 2015-01-29 DIAGNOSIS — Z888 Allergy status to other drugs, medicaments and biological substances status: Secondary | ICD-10-CM | POA: Diagnosis not present

## 2015-01-29 DIAGNOSIS — Z955 Presence of coronary angioplasty implant and graft: Secondary | ICD-10-CM | POA: Diagnosis not present

## 2015-01-29 DIAGNOSIS — Z96642 Presence of left artificial hip joint: Secondary | ICD-10-CM | POA: Diagnosis not present

## 2015-01-29 DIAGNOSIS — Z883 Allergy status to other anti-infective agents status: Secondary | ICD-10-CM | POA: Diagnosis not present

## 2015-01-29 DIAGNOSIS — Z471 Aftercare following joint replacement surgery: Secondary | ICD-10-CM | POA: Diagnosis not present

## 2015-01-29 DIAGNOSIS — Z96641 Presence of right artificial hip joint: Secondary | ICD-10-CM | POA: Diagnosis present

## 2015-01-29 DIAGNOSIS — I4891 Unspecified atrial fibrillation: Secondary | ICD-10-CM | POA: Diagnosis not present

## 2015-01-29 DIAGNOSIS — Z8673 Personal history of transient ischemic attack (TIA), and cerebral infarction without residual deficits: Secondary | ICD-10-CM | POA: Diagnosis not present

## 2015-01-29 DIAGNOSIS — M1612 Unilateral primary osteoarthritis, left hip: Secondary | ICD-10-CM | POA: Diagnosis not present

## 2015-01-29 DIAGNOSIS — Z7951 Long term (current) use of inhaled steroids: Secondary | ICD-10-CM | POA: Diagnosis not present

## 2015-01-29 DIAGNOSIS — Z87891 Personal history of nicotine dependence: Secondary | ICD-10-CM | POA: Diagnosis not present

## 2015-01-29 DIAGNOSIS — R2681 Unsteadiness on feet: Secondary | ICD-10-CM | POA: Diagnosis not present

## 2015-02-01 DIAGNOSIS — D649 Anemia, unspecified: Secondary | ICD-10-CM | POA: Diagnosis not present

## 2015-02-01 DIAGNOSIS — I2510 Atherosclerotic heart disease of native coronary artery without angina pectoris: Secondary | ICD-10-CM | POA: Diagnosis not present

## 2015-02-01 DIAGNOSIS — M150 Primary generalized (osteo)arthritis: Secondary | ICD-10-CM | POA: Diagnosis not present

## 2015-02-01 DIAGNOSIS — I4891 Unspecified atrial fibrillation: Secondary | ICD-10-CM | POA: Diagnosis not present

## 2015-02-01 DIAGNOSIS — M25559 Pain in unspecified hip: Secondary | ICD-10-CM | POA: Diagnosis not present

## 2015-02-01 DIAGNOSIS — I340 Nonrheumatic mitral (valve) insufficiency: Secondary | ICD-10-CM | POA: Diagnosis not present

## 2015-02-01 DIAGNOSIS — B372 Candidiasis of skin and nail: Secondary | ICD-10-CM | POA: Diagnosis not present

## 2015-02-01 DIAGNOSIS — Z471 Aftercare following joint replacement surgery: Secondary | ICD-10-CM | POA: Diagnosis not present

## 2015-02-01 DIAGNOSIS — Z96642 Presence of left artificial hip joint: Secondary | ICD-10-CM | POA: Diagnosis not present

## 2015-02-01 DIAGNOSIS — R262 Difficulty in walking, not elsewhere classified: Secondary | ICD-10-CM | POA: Diagnosis not present

## 2015-02-01 DIAGNOSIS — R2681 Unsteadiness on feet: Secondary | ICD-10-CM | POA: Diagnosis not present

## 2015-02-01 DIAGNOSIS — N390 Urinary tract infection, site not specified: Secondary | ICD-10-CM | POA: Diagnosis not present

## 2015-02-01 DIAGNOSIS — I351 Nonrheumatic aortic (valve) insufficiency: Secondary | ICD-10-CM | POA: Diagnosis not present

## 2015-02-01 DIAGNOSIS — M1990 Unspecified osteoarthritis, unspecified site: Secondary | ICD-10-CM | POA: Diagnosis not present

## 2015-02-01 DIAGNOSIS — I480 Paroxysmal atrial fibrillation: Secondary | ICD-10-CM | POA: Diagnosis not present

## 2015-02-01 DIAGNOSIS — E785 Hyperlipidemia, unspecified: Secondary | ICD-10-CM | POA: Diagnosis not present

## 2015-02-01 DIAGNOSIS — I429 Cardiomyopathy, unspecified: Secondary | ICD-10-CM | POA: Diagnosis not present

## 2015-02-01 DIAGNOSIS — E639 Nutritional deficiency, unspecified: Secondary | ICD-10-CM | POA: Diagnosis not present

## 2015-02-01 DIAGNOSIS — R279 Unspecified lack of coordination: Secondary | ICD-10-CM | POA: Diagnosis not present

## 2015-02-01 DIAGNOSIS — R112 Nausea with vomiting, unspecified: Secondary | ICD-10-CM | POA: Diagnosis not present

## 2015-02-01 DIAGNOSIS — S32302D Unspecified fracture of left ilium, subsequent encounter for fracture with routine healing: Secondary | ICD-10-CM | POA: Diagnosis not present

## 2015-02-01 DIAGNOSIS — E039 Hypothyroidism, unspecified: Secondary | ICD-10-CM | POA: Diagnosis not present

## 2015-02-04 DIAGNOSIS — E785 Hyperlipidemia, unspecified: Secondary | ICD-10-CM | POA: Diagnosis not present

## 2015-02-04 DIAGNOSIS — S32302D Unspecified fracture of left ilium, subsequent encounter for fracture with routine healing: Secondary | ICD-10-CM | POA: Diagnosis not present

## 2015-02-04 DIAGNOSIS — M150 Primary generalized (osteo)arthritis: Secondary | ICD-10-CM | POA: Diagnosis not present

## 2015-02-04 DIAGNOSIS — I4891 Unspecified atrial fibrillation: Secondary | ICD-10-CM | POA: Diagnosis not present

## 2015-02-05 DIAGNOSIS — M25559 Pain in unspecified hip: Secondary | ICD-10-CM | POA: Diagnosis not present

## 2015-02-05 DIAGNOSIS — R262 Difficulty in walking, not elsewhere classified: Secondary | ICD-10-CM | POA: Diagnosis not present

## 2015-02-06 DIAGNOSIS — I4891 Unspecified atrial fibrillation: Secondary | ICD-10-CM | POA: Diagnosis not present

## 2015-02-06 DIAGNOSIS — R112 Nausea with vomiting, unspecified: Secondary | ICD-10-CM | POA: Diagnosis not present

## 2015-02-07 DIAGNOSIS — M25559 Pain in unspecified hip: Secondary | ICD-10-CM | POA: Diagnosis not present

## 2015-02-08 DIAGNOSIS — R112 Nausea with vomiting, unspecified: Secondary | ICD-10-CM | POA: Diagnosis not present

## 2015-02-08 DIAGNOSIS — B372 Candidiasis of skin and nail: Secondary | ICD-10-CM | POA: Diagnosis not present

## 2015-02-12 DIAGNOSIS — E039 Hypothyroidism, unspecified: Secondary | ICD-10-CM | POA: Diagnosis not present

## 2015-02-12 DIAGNOSIS — I4891 Unspecified atrial fibrillation: Secondary | ICD-10-CM | POA: Diagnosis not present

## 2015-02-12 DIAGNOSIS — S32302D Unspecified fracture of left ilium, subsequent encounter for fracture with routine healing: Secondary | ICD-10-CM | POA: Diagnosis not present

## 2015-02-12 DIAGNOSIS — E785 Hyperlipidemia, unspecified: Secondary | ICD-10-CM | POA: Diagnosis not present

## 2015-02-13 DIAGNOSIS — Z96642 Presence of left artificial hip joint: Secondary | ICD-10-CM | POA: Diagnosis not present

## 2015-02-14 DIAGNOSIS — I480 Paroxysmal atrial fibrillation: Secondary | ICD-10-CM | POA: Diagnosis not present

## 2015-02-14 DIAGNOSIS — I429 Cardiomyopathy, unspecified: Secondary | ICD-10-CM | POA: Diagnosis not present

## 2015-02-14 DIAGNOSIS — I351 Nonrheumatic aortic (valve) insufficiency: Secondary | ICD-10-CM | POA: Diagnosis not present

## 2015-02-14 DIAGNOSIS — I2510 Atherosclerotic heart disease of native coronary artery without angina pectoris: Secondary | ICD-10-CM | POA: Diagnosis not present

## 2015-02-14 DIAGNOSIS — I340 Nonrheumatic mitral (valve) insufficiency: Secondary | ICD-10-CM | POA: Diagnosis not present

## 2015-02-19 ENCOUNTER — Ambulatory Visit (INDEPENDENT_AMBULATORY_CARE_PROVIDER_SITE_OTHER): Payer: Medicare Other | Admitting: Family Medicine

## 2015-02-19 DIAGNOSIS — Z7901 Long term (current) use of anticoagulants: Secondary | ICD-10-CM | POA: Diagnosis not present

## 2015-02-19 LAB — POCT INR: INR: 2

## 2015-02-26 ENCOUNTER — Encounter: Payer: Self-pay | Admitting: Family Medicine

## 2015-02-26 ENCOUNTER — Ambulatory Visit (INDEPENDENT_AMBULATORY_CARE_PROVIDER_SITE_OTHER): Payer: Medicare Other | Admitting: Family Medicine

## 2015-02-26 VITALS — BP 123/65 | HR 74 | Wt 194.0 lb

## 2015-02-26 DIAGNOSIS — I2510 Atherosclerotic heart disease of native coronary artery without angina pectoris: Secondary | ICD-10-CM

## 2015-02-26 DIAGNOSIS — Z5189 Encounter for other specified aftercare: Secondary | ICD-10-CM

## 2015-02-26 DIAGNOSIS — L081 Erythrasma: Secondary | ICD-10-CM

## 2015-02-26 DIAGNOSIS — Z87440 Personal history of urinary (tract) infections: Secondary | ICD-10-CM

## 2015-02-26 LAB — POCT URINALYSIS DIPSTICK
Glucose, UA: NEGATIVE
Leukocytes, UA: NEGATIVE
NITRITE UA: NEGATIVE
Protein, UA: NEGATIVE
RBC UA: NEGATIVE
SPEC GRAV UA: 1.02
Urobilinogen, UA: 1
pH, UA: 5.5

## 2015-02-26 MED ORDER — ERYTHROMYCIN 2 % EX GEL: Freq: Two times a day (BID) | CUTANEOUS | Status: DC

## 2015-02-26 NOTE — Progress Notes (Signed)
   Subjective:    Patient ID: [name], male    DOB: September 14, 1941, 74 y.o.   MRN: [id number]  HPI Just had left hip replacement 6 weeks ago. Doing well. Off of pain medications. Had UTI while there and just want to make sure that has cleared with a repeat urine. His only sx was vomiting.  He is feeling better now. No under PT right now. Still using a walker as he has been told to still keep weight to under 50%.    Getting a rash in the crotch area.  It is red. Has had it before.  It is itching and burning.  Has been using cortisone cream OTC nad helps some.   Using the nystatin powder from rehab and helps some.  Says has been battling it off and on for the last year.    Also wants me to look at his scar. No staples.  It had a type of surgical tape on it and wants to make sure it is healing well. Says was told when the edges were curling up that he could remove the tape. He has been fearful to do have set himself. He was afraid it might reopen the wound.  Review of Systems     Objective:   Physical Exam  Constitutional: He is oriented to person, place, and time. He appears well-developed and well-nourished.  Neurological: He is alert and oriented to person, place, and time.  Skin: Skin is warm and dry.  Incision over the left hip is well-healed. There is a little bit of hypertrophic scarring at the superior and of the incision. There still a little bit of dried blood and scabbing. Tape was easily removed. In the groin area he does have a confluent erythematous area with some scaling along the groin creases. No sparing along the groin crease. The area did fluoresce coral color under black light. The area is well demarcated.  Psychiatric: He has a normal mood and affect. His behavior is normal.          Assessment & Plan:  Recent UTI - we did recheck urine today and it was negative for infection.  Scar - tape removed easily. Patient tolerated very well. Scar is healing well. Recommend  that he wash the area just with fingertips and soap in the shower. No scrubbing. Some of the scab will remove itself over the next week. Pat dry.  Erythramsma - he did have some coral fluorescence with black light. We'll treat as erythrasma. Will treat with topical erythromycin gel. If this is irritating to the skin or burning then please call me could switch to solution.

## 2015-02-27 ENCOUNTER — Encounter: Payer: Self-pay | Admitting: Family Medicine

## 2015-03-04 ENCOUNTER — Ambulatory Visit (INDEPENDENT_AMBULATORY_CARE_PROVIDER_SITE_OTHER): Payer: Medicare Other | Admitting: Family Medicine

## 2015-03-04 ENCOUNTER — Encounter: Payer: Self-pay | Admitting: Family Medicine

## 2015-03-04 VITALS — BP 114/61 | HR 74 | Wt 194.0 lb

## 2015-03-04 DIAGNOSIS — I2510 Atherosclerotic heart disease of native coronary artery without angina pectoris: Secondary | ICD-10-CM | POA: Diagnosis not present

## 2015-03-04 DIAGNOSIS — R21 Rash and other nonspecific skin eruption: Secondary | ICD-10-CM

## 2015-03-04 NOTE — Progress Notes (Signed)
   Subjective:    Patient ID: [name], male    DOB: June 18, 1941, 74 y.o.   MRN: [id number]  HPI Says the groin rash is worse. Hasn't spread per se but now noticing some cracking in the skin. Unfortunately, the pharmacy was not able to get the erythromycin gel. The only had the brand of his insurance would not cover it. They have tried order it to get it and by Friday but they never received the product. Yesterday he had she started to notice some cracking in the skin and became concerned some edema appointment today. Plus he wanted to avoid any type of irritating her alcohol gel products since he now has an open area. He has not noticed any drainage from the area no fevers chills or sweats.   Review of Systems     Objective:   Physical Exam  Constitutional: He is oriented to person, place, and time. He appears well-developed and well-nourished.  HENT:  Head: Atraumatic.  Neurological: He is alert and oriented to person, place, and time.  Skin: Skin is warm and dry.  Along the groin creases he has a faint pink rash with some fine scale. It does go on to the testicular area. This is where he noticed the skin crack. In fact he has 2 small cracks. One is approximately a centimeter and one distally little less than a centimeter. No drainage  Psychiatric: He has a normal mood and affect. His behavior is normal.          Assessment & Plan:  Groin rash-initially I felt like this was consistent with erythrasma which it may still be. I did go ahead and do a KOH skin scraping today to evaluate for fungal elements since he is now expressing some cracking in the skin. He denies any rash on his feet or elsewhere on the body. We'll call results once available.

## 2015-03-06 LAB — KOH PREP: RESULT - KOH: NONE SEEN

## 2015-03-07 ENCOUNTER — Other Ambulatory Visit: Payer: Self-pay | Admitting: Family Medicine

## 2015-03-07 MED ORDER — ERYTHROMYCIN BASE 250 MG PO TABS: 250.0000 mg | ORAL_TABLET | Freq: Four times a day (QID) | ORAL | Status: DC

## 2015-03-07 MED ORDER — BENZOYL PEROXIDE 5 % EX LOTN: 1.0000 "application " | TOPICAL_LOTION | Freq: Every day | CUTANEOUS | Status: DC

## 2015-03-08 ENCOUNTER — Telehealth: Payer: Self-pay | Admitting: *Deleted

## 2015-03-08 MED ORDER — ERYTHROMYCIN 2 % EX SOLN: Freq: Every day | CUTANEOUS | Status: DC

## 2015-03-08 NOTE — Addendum Note (Signed)
Addended by: Beatrice Lecher D on: 03/08/2015 01:04 PM   Modules accepted: Orders

## 2015-03-08 NOTE — Telephone Encounter (Signed)
Gave results 

## 2015-03-19 DIAGNOSIS — Z7409 Other reduced mobility: Secondary | ICD-10-CM | POA: Diagnosis not present

## 2015-03-19 DIAGNOSIS — R269 Unspecified abnormalities of gait and mobility: Secondary | ICD-10-CM | POA: Diagnosis not present

## 2015-03-19 DIAGNOSIS — Z96642 Presence of left artificial hip joint: Secondary | ICD-10-CM | POA: Diagnosis not present

## 2015-03-19 DIAGNOSIS — M1990 Unspecified osteoarthritis, unspecified site: Secondary | ICD-10-CM | POA: Diagnosis not present

## 2015-03-19 DIAGNOSIS — M25652 Stiffness of left hip, not elsewhere classified: Secondary | ICD-10-CM | POA: Diagnosis not present

## 2015-03-19 DIAGNOSIS — M25552 Pain in left hip: Secondary | ICD-10-CM | POA: Diagnosis not present

## 2015-03-19 DIAGNOSIS — Z471 Aftercare following joint replacement surgery: Secondary | ICD-10-CM | POA: Diagnosis not present

## 2015-03-19 DIAGNOSIS — M1612 Unilateral primary osteoarthritis, left hip: Secondary | ICD-10-CM | POA: Diagnosis not present

## 2015-03-20 DIAGNOSIS — L820 Inflamed seborrheic keratosis: Secondary | ICD-10-CM | POA: Diagnosis not present

## 2015-03-20 DIAGNOSIS — D225 Melanocytic nevi of trunk: Secondary | ICD-10-CM | POA: Diagnosis not present

## 2015-03-20 DIAGNOSIS — L570 Actinic keratosis: Secondary | ICD-10-CM | POA: Diagnosis not present

## 2015-03-20 DIAGNOSIS — D485 Neoplasm of uncertain behavior of skin: Secondary | ICD-10-CM | POA: Diagnosis not present

## 2015-03-26 DIAGNOSIS — R6889 Other general symptoms and signs: Secondary | ICD-10-CM | POA: Diagnosis not present

## 2015-03-26 DIAGNOSIS — M25612 Stiffness of left shoulder, not elsewhere classified: Secondary | ICD-10-CM | POA: Diagnosis not present

## 2015-03-26 DIAGNOSIS — R269 Unspecified abnormalities of gait and mobility: Secondary | ICD-10-CM | POA: Diagnosis not present

## 2015-03-26 DIAGNOSIS — M25512 Pain in left shoulder: Secondary | ICD-10-CM | POA: Diagnosis not present

## 2015-03-26 DIAGNOSIS — Z96642 Presence of left artificial hip joint: Secondary | ICD-10-CM | POA: Diagnosis not present

## 2015-03-26 DIAGNOSIS — Z471 Aftercare following joint replacement surgery: Secondary | ICD-10-CM | POA: Diagnosis not present

## 2015-04-02 DIAGNOSIS — M25512 Pain in left shoulder: Secondary | ICD-10-CM | POA: Diagnosis not present

## 2015-04-02 DIAGNOSIS — Z96642 Presence of left artificial hip joint: Secondary | ICD-10-CM | POA: Diagnosis not present

## 2015-04-02 DIAGNOSIS — Z471 Aftercare following joint replacement surgery: Secondary | ICD-10-CM | POA: Diagnosis not present

## 2015-04-02 DIAGNOSIS — M25612 Stiffness of left shoulder, not elsewhere classified: Secondary | ICD-10-CM | POA: Diagnosis not present

## 2015-04-02 DIAGNOSIS — R269 Unspecified abnormalities of gait and mobility: Secondary | ICD-10-CM | POA: Diagnosis not present

## 2015-04-02 DIAGNOSIS — R6889 Other general symptoms and signs: Secondary | ICD-10-CM | POA: Diagnosis not present

## 2015-04-09 DIAGNOSIS — Z471 Aftercare following joint replacement surgery: Secondary | ICD-10-CM | POA: Diagnosis not present

## 2015-04-09 DIAGNOSIS — Z96642 Presence of left artificial hip joint: Secondary | ICD-10-CM | POA: Diagnosis not present

## 2015-04-09 DIAGNOSIS — M25612 Stiffness of left shoulder, not elsewhere classified: Secondary | ICD-10-CM | POA: Diagnosis not present

## 2015-04-09 DIAGNOSIS — R6889 Other general symptoms and signs: Secondary | ICD-10-CM | POA: Diagnosis not present

## 2015-04-09 DIAGNOSIS — R269 Unspecified abnormalities of gait and mobility: Secondary | ICD-10-CM | POA: Diagnosis not present

## 2015-04-09 DIAGNOSIS — M25512 Pain in left shoulder: Secondary | ICD-10-CM | POA: Diagnosis not present

## 2015-04-11 DIAGNOSIS — M9901 Segmental and somatic dysfunction of cervical region: Secondary | ICD-10-CM | POA: Diagnosis not present

## 2015-04-11 DIAGNOSIS — M9902 Segmental and somatic dysfunction of thoracic region: Secondary | ICD-10-CM | POA: Diagnosis not present

## 2015-04-11 DIAGNOSIS — M9903 Segmental and somatic dysfunction of lumbar region: Secondary | ICD-10-CM | POA: Diagnosis not present

## 2015-04-11 DIAGNOSIS — M542 Cervicalgia: Secondary | ICD-10-CM | POA: Diagnosis not present

## 2015-04-12 ENCOUNTER — Other Ambulatory Visit: Payer: Self-pay | Admitting: Family Medicine

## 2015-04-15 DIAGNOSIS — M9902 Segmental and somatic dysfunction of thoracic region: Secondary | ICD-10-CM | POA: Diagnosis not present

## 2015-04-15 DIAGNOSIS — M542 Cervicalgia: Secondary | ICD-10-CM | POA: Diagnosis not present

## 2015-04-15 DIAGNOSIS — M9903 Segmental and somatic dysfunction of lumbar region: Secondary | ICD-10-CM | POA: Diagnosis not present

## 2015-04-15 DIAGNOSIS — M9901 Segmental and somatic dysfunction of cervical region: Secondary | ICD-10-CM | POA: Diagnosis not present

## 2015-04-16 DIAGNOSIS — M25612 Stiffness of left shoulder, not elsewhere classified: Secondary | ICD-10-CM | POA: Diagnosis not present

## 2015-04-16 DIAGNOSIS — Z96642 Presence of left artificial hip joint: Secondary | ICD-10-CM | POA: Diagnosis not present

## 2015-04-16 DIAGNOSIS — Z471 Aftercare following joint replacement surgery: Secondary | ICD-10-CM | POA: Diagnosis not present

## 2015-04-16 DIAGNOSIS — M25512 Pain in left shoulder: Secondary | ICD-10-CM | POA: Diagnosis not present

## 2015-04-16 DIAGNOSIS — R269 Unspecified abnormalities of gait and mobility: Secondary | ICD-10-CM | POA: Diagnosis not present

## 2015-04-16 DIAGNOSIS — R6889 Other general symptoms and signs: Secondary | ICD-10-CM | POA: Diagnosis not present

## 2015-04-18 DIAGNOSIS — M9903 Segmental and somatic dysfunction of lumbar region: Secondary | ICD-10-CM | POA: Diagnosis not present

## 2015-04-18 DIAGNOSIS — M9902 Segmental and somatic dysfunction of thoracic region: Secondary | ICD-10-CM | POA: Diagnosis not present

## 2015-04-18 DIAGNOSIS — M9901 Segmental and somatic dysfunction of cervical region: Secondary | ICD-10-CM | POA: Diagnosis not present

## 2015-04-18 DIAGNOSIS — M542 Cervicalgia: Secondary | ICD-10-CM | POA: Diagnosis not present

## 2015-04-22 DIAGNOSIS — I351 Nonrheumatic aortic (valve) insufficiency: Secondary | ICD-10-CM | POA: Diagnosis not present

## 2015-04-22 DIAGNOSIS — I429 Cardiomyopathy, unspecified: Secondary | ICD-10-CM | POA: Diagnosis not present

## 2015-04-22 DIAGNOSIS — I340 Nonrheumatic mitral (valve) insufficiency: Secondary | ICD-10-CM | POA: Diagnosis not present

## 2015-04-22 DIAGNOSIS — I2510 Atherosclerotic heart disease of native coronary artery without angina pectoris: Secondary | ICD-10-CM | POA: Diagnosis not present

## 2015-04-25 DIAGNOSIS — Z96642 Presence of left artificial hip joint: Secondary | ICD-10-CM | POA: Diagnosis not present

## 2015-04-29 DIAGNOSIS — M9903 Segmental and somatic dysfunction of lumbar region: Secondary | ICD-10-CM | POA: Diagnosis not present

## 2015-04-29 DIAGNOSIS — M9902 Segmental and somatic dysfunction of thoracic region: Secondary | ICD-10-CM | POA: Diagnosis not present

## 2015-04-29 DIAGNOSIS — M542 Cervicalgia: Secondary | ICD-10-CM | POA: Diagnosis not present

## 2015-04-29 DIAGNOSIS — M9901 Segmental and somatic dysfunction of cervical region: Secondary | ICD-10-CM | POA: Diagnosis not present

## 2015-04-30 ENCOUNTER — Encounter: Payer: Self-pay | Admitting: Family Medicine

## 2015-04-30 DIAGNOSIS — I340 Nonrheumatic mitral (valve) insufficiency: Secondary | ICD-10-CM | POA: Insufficient documentation

## 2015-04-30 DIAGNOSIS — I429 Cardiomyopathy, unspecified: Secondary | ICD-10-CM | POA: Insufficient documentation

## 2015-05-02 DIAGNOSIS — M542 Cervicalgia: Secondary | ICD-10-CM | POA: Diagnosis not present

## 2015-05-02 DIAGNOSIS — M9902 Segmental and somatic dysfunction of thoracic region: Secondary | ICD-10-CM | POA: Diagnosis not present

## 2015-05-02 DIAGNOSIS — M9901 Segmental and somatic dysfunction of cervical region: Secondary | ICD-10-CM | POA: Diagnosis not present

## 2015-05-02 DIAGNOSIS — M9903 Segmental and somatic dysfunction of lumbar region: Secondary | ICD-10-CM | POA: Diagnosis not present

## 2015-05-06 DIAGNOSIS — M542 Cervicalgia: Secondary | ICD-10-CM | POA: Diagnosis not present

## 2015-05-06 DIAGNOSIS — M9902 Segmental and somatic dysfunction of thoracic region: Secondary | ICD-10-CM | POA: Diagnosis not present

## 2015-05-06 DIAGNOSIS — M9903 Segmental and somatic dysfunction of lumbar region: Secondary | ICD-10-CM | POA: Diagnosis not present

## 2015-05-06 DIAGNOSIS — M9901 Segmental and somatic dysfunction of cervical region: Secondary | ICD-10-CM | POA: Diagnosis not present

## 2015-05-07 DIAGNOSIS — M545 Low back pain: Secondary | ICD-10-CM | POA: Diagnosis not present

## 2015-05-07 DIAGNOSIS — Z96642 Presence of left artificial hip joint: Secondary | ICD-10-CM | POA: Diagnosis not present

## 2015-05-07 DIAGNOSIS — M169 Osteoarthritis of hip, unspecified: Secondary | ICD-10-CM | POA: Diagnosis not present

## 2015-05-08 DIAGNOSIS — I340 Nonrheumatic mitral (valve) insufficiency: Secondary | ICD-10-CM | POA: Diagnosis not present

## 2015-05-08 DIAGNOSIS — I351 Nonrheumatic aortic (valve) insufficiency: Secondary | ICD-10-CM | POA: Diagnosis not present

## 2015-05-08 DIAGNOSIS — I429 Cardiomyopathy, unspecified: Secondary | ICD-10-CM | POA: Diagnosis not present

## 2015-05-08 DIAGNOSIS — I2510 Atherosclerotic heart disease of native coronary artery without angina pectoris: Secondary | ICD-10-CM | POA: Diagnosis not present

## 2015-05-10 DIAGNOSIS — M5136 Other intervertebral disc degeneration, lumbar region: Secondary | ICD-10-CM | POA: Diagnosis not present

## 2015-05-10 DIAGNOSIS — M5416 Radiculopathy, lumbar region: Secondary | ICD-10-CM | POA: Diagnosis not present

## 2015-05-10 DIAGNOSIS — M4726 Other spondylosis with radiculopathy, lumbar region: Secondary | ICD-10-CM | POA: Diagnosis not present

## 2015-05-14 DIAGNOSIS — M169 Osteoarthritis of hip, unspecified: Secondary | ICD-10-CM | POA: Diagnosis not present

## 2015-05-14 DIAGNOSIS — Z96642 Presence of left artificial hip joint: Secondary | ICD-10-CM | POA: Diagnosis not present

## 2015-05-14 DIAGNOSIS — M545 Low back pain: Secondary | ICD-10-CM | POA: Diagnosis not present

## 2015-05-22 DIAGNOSIS — M545 Low back pain: Secondary | ICD-10-CM | POA: Diagnosis not present

## 2015-05-22 DIAGNOSIS — Z96642 Presence of left artificial hip joint: Secondary | ICD-10-CM | POA: Diagnosis not present

## 2015-05-22 DIAGNOSIS — Z471 Aftercare following joint replacement surgery: Secondary | ICD-10-CM | POA: Diagnosis not present

## 2015-05-23 DIAGNOSIS — M542 Cervicalgia: Secondary | ICD-10-CM | POA: Diagnosis not present

## 2015-05-23 DIAGNOSIS — M9903 Segmental and somatic dysfunction of lumbar region: Secondary | ICD-10-CM | POA: Diagnosis not present

## 2015-05-23 DIAGNOSIS — M9902 Segmental and somatic dysfunction of thoracic region: Secondary | ICD-10-CM | POA: Diagnosis not present

## 2015-05-23 DIAGNOSIS — M9901 Segmental and somatic dysfunction of cervical region: Secondary | ICD-10-CM | POA: Diagnosis not present

## 2015-05-27 DIAGNOSIS — M4806 Spinal stenosis, lumbar region: Secondary | ICD-10-CM | POA: Diagnosis not present

## 2015-05-28 DIAGNOSIS — Z471 Aftercare following joint replacement surgery: Secondary | ICD-10-CM | POA: Diagnosis not present

## 2015-05-28 DIAGNOSIS — M545 Low back pain: Secondary | ICD-10-CM | POA: Diagnosis not present

## 2015-05-28 DIAGNOSIS — Z96642 Presence of left artificial hip joint: Secondary | ICD-10-CM | POA: Diagnosis not present

## 2015-05-28 DIAGNOSIS — M1612 Unilateral primary osteoarthritis, left hip: Secondary | ICD-10-CM | POA: Diagnosis not present

## 2015-05-29 DIAGNOSIS — H259 Unspecified age-related cataract: Secondary | ICD-10-CM | POA: Diagnosis not present

## 2015-06-12 DIAGNOSIS — M545 Low back pain: Secondary | ICD-10-CM | POA: Diagnosis not present

## 2015-06-12 DIAGNOSIS — Z96642 Presence of left artificial hip joint: Secondary | ICD-10-CM | POA: Diagnosis not present

## 2015-06-12 DIAGNOSIS — Z471 Aftercare following joint replacement surgery: Secondary | ICD-10-CM | POA: Diagnosis not present

## 2015-06-25 ENCOUNTER — Ambulatory Visit: Payer: Medicare Other | Admitting: Family Medicine

## 2015-06-25 DIAGNOSIS — M545 Low back pain: Secondary | ICD-10-CM | POA: Diagnosis not present

## 2015-06-25 DIAGNOSIS — M1612 Unilateral primary osteoarthritis, left hip: Secondary | ICD-10-CM | POA: Diagnosis not present

## 2015-06-25 DIAGNOSIS — Z96642 Presence of left artificial hip joint: Secondary | ICD-10-CM | POA: Diagnosis not present

## 2015-06-26 DIAGNOSIS — I788 Other diseases of capillaries: Secondary | ICD-10-CM | POA: Diagnosis not present

## 2015-06-27 DIAGNOSIS — M4726 Other spondylosis with radiculopathy, lumbar region: Secondary | ICD-10-CM | POA: Diagnosis not present

## 2015-06-27 DIAGNOSIS — M5136 Other intervertebral disc degeneration, lumbar region: Secondary | ICD-10-CM | POA: Diagnosis not present

## 2015-06-27 DIAGNOSIS — M5416 Radiculopathy, lumbar region: Secondary | ICD-10-CM | POA: Diagnosis not present

## 2015-07-02 DIAGNOSIS — M1612 Unilateral primary osteoarthritis, left hip: Secondary | ICD-10-CM | POA: Diagnosis not present

## 2015-07-02 DIAGNOSIS — M545 Low back pain: Secondary | ICD-10-CM | POA: Diagnosis not present

## 2015-07-02 DIAGNOSIS — Z96642 Presence of left artificial hip joint: Secondary | ICD-10-CM | POA: Diagnosis not present

## 2015-07-08 DIAGNOSIS — M545 Low back pain: Secondary | ICD-10-CM | POA: Diagnosis not present

## 2015-07-08 DIAGNOSIS — Z96642 Presence of left artificial hip joint: Secondary | ICD-10-CM | POA: Diagnosis not present

## 2015-07-08 DIAGNOSIS — M1612 Unilateral primary osteoarthritis, left hip: Secondary | ICD-10-CM | POA: Diagnosis not present

## 2015-07-09 ENCOUNTER — Ambulatory Visit (INDEPENDENT_AMBULATORY_CARE_PROVIDER_SITE_OTHER): Payer: Medicare Other | Admitting: Family Medicine

## 2015-07-09 ENCOUNTER — Encounter: Payer: Self-pay | Admitting: Family Medicine

## 2015-07-09 VITALS — BP 116/57 | HR 63 | Ht 70.5 in | Wt 192.0 lb

## 2015-07-09 DIAGNOSIS — I270 Primary pulmonary hypertension: Secondary | ICD-10-CM

## 2015-07-09 DIAGNOSIS — I2510 Atherosclerotic heart disease of native coronary artery without angina pectoris: Secondary | ICD-10-CM | POA: Diagnosis not present

## 2015-07-09 DIAGNOSIS — J383 Other diseases of vocal cords: Secondary | ICD-10-CM

## 2015-07-09 DIAGNOSIS — I429 Cardiomyopathy, unspecified: Secondary | ICD-10-CM

## 2015-07-09 DIAGNOSIS — E038 Other specified hypothyroidism: Secondary | ICD-10-CM

## 2015-07-09 DIAGNOSIS — R490 Dysphonia: Secondary | ICD-10-CM

## 2015-07-09 DIAGNOSIS — R0982 Postnasal drip: Secondary | ICD-10-CM

## 2015-07-09 DIAGNOSIS — I272 Pulmonary hypertension, unspecified: Secondary | ICD-10-CM

## 2015-07-09 MED ORDER — FLUTICASONE PROPIONATE 50 MCG/ACT NA SUSP: 1.0000 | Freq: Every day | NASAL | Status: DC

## 2015-07-09 NOTE — Addendum Note (Signed)
Addended by: Teddy Spike on: 07/09/2015 01:49 PM   Modules accepted: Orders, Medications

## 2015-07-09 NOTE — Progress Notes (Signed)
   Subjective:    Patient ID: [name], male    DOB: Mar 05, 1941, 74 y.o.   MRN: [id number]  HPI CAD/nonischemic cardiomyopathy- Pt denies chest pain, SOB, dizziness, or heart palpitations.  Taking meds as directed w/o problems.  Denies medication side effects.  Currently on metoprolol 50 mg 3 times a day and simvastatin.   Hypothyroidism - currently on levothyroxine 75 g daily. No recent skin or hair changes.  No weight changes.    Needs a letter for his new dentist to say he is 6 months out from joint replacement and is ok for dental cleaning.    Voice has been hoarse since had L4 epidural injection.  Says voice is ok in the morning but gets weaker as the day goes on.  Previously dx with vocal bowing. Has had some post nasal drip.    Review of Systems     Objective:   Physical Exam  Constitutional: He is oriented to person, place, and time. He appears well-developed and well-nourished.  HENT:  Head: Normocephalic and atraumatic.  Neck: Neck supple. No thyromegaly present.  Cardiovascular: Normal rate, regular rhythm and normal heart sounds.   Pulmonary/Chest: Effort normal and breath sounds normal.  Lymphadenopathy:    He has no cervical adenopathy.  Neurological: He is alert and oriented to person, place, and time.  Skin: Skin is warm and dry.  Psychiatric: He has a normal mood and affect. His behavior is normal.          Assessment & Plan:  CAD/ nonischemic cardiomyopathy-  BPwell controlled. Continue current regimen. Follow-up in 6 months. Due for BMP.  On statin and that her blocker. Continue current regimen.  Hypothyroidism- feels symptomatic. Recheck level in 6 months.   Vocal cord bowing previously dx by ENT.  If sxs persist let me know.  He denis any reflux sxs. Could consider tx of allergies.   Postnasal drip-we can try putting him back on fluticasone.

## 2015-07-10 LAB — BASIC METABOLIC PANEL WITH GFR
BUN: 27 mg/dL — AB (ref 6–23)
CHLORIDE: 102 meq/L (ref 96–112)
CO2: 23 meq/L (ref 19–32)
CREATININE: 0.98 mg/dL (ref 0.50–1.35)
Calcium: 9.4 mg/dL (ref 8.4–10.5)
GFR, EST NON AFRICAN AMERICAN: 76 mL/min
GFR, Est African American: 88 mL/min
Glucose, Bld: 92 mg/dL (ref 70–99)
POTASSIUM: 4.8 meq/L (ref 3.5–5.3)
Sodium: 138 mEq/L (ref 135–145)

## 2015-07-17 DIAGNOSIS — M545 Low back pain: Secondary | ICD-10-CM | POA: Diagnosis not present

## 2015-07-17 DIAGNOSIS — G8929 Other chronic pain: Secondary | ICD-10-CM | POA: Diagnosis not present

## 2015-07-17 DIAGNOSIS — Z96642 Presence of left artificial hip joint: Secondary | ICD-10-CM | POA: Diagnosis not present

## 2015-07-17 DIAGNOSIS — Z471 Aftercare following joint replacement surgery: Secondary | ICD-10-CM | POA: Diagnosis not present

## 2015-07-23 DIAGNOSIS — M545 Low back pain: Secondary | ICD-10-CM | POA: Diagnosis not present

## 2015-07-23 DIAGNOSIS — Z471 Aftercare following joint replacement surgery: Secondary | ICD-10-CM | POA: Diagnosis not present

## 2015-07-23 DIAGNOSIS — Z96642 Presence of left artificial hip joint: Secondary | ICD-10-CM | POA: Diagnosis not present

## 2015-07-29 DIAGNOSIS — Z96642 Presence of left artificial hip joint: Secondary | ICD-10-CM | POA: Diagnosis not present

## 2015-07-29 DIAGNOSIS — I2510 Atherosclerotic heart disease of native coronary artery without angina pectoris: Secondary | ICD-10-CM | POA: Diagnosis not present

## 2015-08-05 DIAGNOSIS — M4726 Other spondylosis with radiculopathy, lumbar region: Secondary | ICD-10-CM | POA: Diagnosis not present

## 2015-08-05 DIAGNOSIS — M5416 Radiculopathy, lumbar region: Secondary | ICD-10-CM | POA: Diagnosis not present

## 2015-08-05 DIAGNOSIS — M5136 Other intervertebral disc degeneration, lumbar region: Secondary | ICD-10-CM | POA: Diagnosis not present

## 2015-09-03 ENCOUNTER — Ambulatory Visit (INDEPENDENT_AMBULATORY_CARE_PROVIDER_SITE_OTHER): Payer: Medicare Other | Admitting: Family Medicine

## 2015-09-03 VITALS — BP 130/59 | HR 69 | Temp 98.2°F | Wt 190.0 lb

## 2015-09-03 DIAGNOSIS — L723 Sebaceous cyst: Secondary | ICD-10-CM

## 2015-09-03 DIAGNOSIS — L089 Local infection of the skin and subcutaneous tissue, unspecified: Secondary | ICD-10-CM | POA: Insufficient documentation

## 2015-09-03 DIAGNOSIS — R252 Cramp and spasm: Secondary | ICD-10-CM | POA: Diagnosis not present

## 2015-09-03 DIAGNOSIS — I2510 Atherosclerotic heart disease of native coronary artery without angina pectoris: Secondary | ICD-10-CM | POA: Diagnosis not present

## 2015-09-03 NOTE — Patient Instructions (Addendum)
Thank you for coming in today. Change the dressing when it becomes dirty.  This should get better on its own.  If it does not improve we can use antibiotics.  Return if not better.   We will check lab values to make sure the medicine is not causing leg cramps.   Return if not getting better.     Epidermal Cyst An epidermal cyst is sometimes called a sebaceous cyst, epidermal inclusion cyst, or infundibular cyst. These cysts usually contain a substance that looks "pasty" or "cheesy" and may have a bad smell. This substance is a protein called keratin. Epidermal cysts are usually found on the face, neck, or trunk. They may also occur in the vaginal area or other parts of the genitalia of both men and women. Epidermal cysts are usually small, painless, slow-growing bumps or lumps that move freely under the skin. It is important not to try to pop them. This may cause an infection and lead to tenderness and swelling. CAUSES  Epidermal cysts may be caused by a deep penetrating injury to the skin or a plugged hair follicle, often associated with acne. SYMPTOMS  Epidermal cysts can become inflamed and cause:  Redness.  Tenderness.  Increased temperature of the skin over the bumps or lumps.  Grayish-white, bad smelling material that drains from the bump or lump. DIAGNOSIS  Epidermal cysts are easily diagnosed by your caregiver during an exam. Rarely, a tissue sample (biopsy) may be taken to rule out other conditions that may resemble epidermal cysts. TREATMENT   Epidermal cysts often get better and disappear on their own. They are rarely ever cancerous.  If a cyst becomes infected, it may become inflamed and tender. This may require opening and draining the cyst. Treatment with antibiotics may be necessary. When the infection is gone, the cyst may be removed with minor surgery.  Small, inflamed cysts can often be treated with antibiotics or by injecting steroid medicines.  Sometimes,  epidermal cysts become large and bothersome. If this happens, surgical removal in your caregiver's office may be necessary. HOME CARE INSTRUCTIONS  Only take over-the-counter or prescription medicines as directed by your caregiver.  Take your antibiotics as directed. Finish them even if you start to feel better. SEEK MEDICAL CARE IF:   Your cyst becomes tender, red, or swollen.  Your condition is not improving or is getting worse.  You have any other questions or concerns. MAKE SURE YOU:  Understand these instructions.  Will watch your condition.  Will get help right away if you are not doing well or get worse. Document Released: 11/07/2004 Document Revised: 02/29/2012 Document Reviewed: 06/15/2011 [facility] Patient Information 2015 [place], [place]. This information is not intended to replace advice given to you by your health care provider. Make sure you discuss any questions you have with your health care provider.

## 2015-09-03 NOTE — Assessment & Plan Note (Signed)
Abscess most consistent with infected sebaceous cyst. Culture of abscess pending. No antibiotics this time. Follow-up with PCP as needed

## 2015-09-03 NOTE — Progress Notes (Signed)
[name] is a 74 y.o. male who presents to [facility]  today for abscess and leg cramping.  1) patient has a several day history of abscess in his central back. It is tender but not draining. No fevers or chills nausea vomiting or diarrhea. He has tried some topical over-the-counter creams that have not helped.  2) additionally patient notes leg cramping occurring bilaterally. This is been ongoing for a few weeks. He's had similar symptoms in the past but cannot recall what the cause or treatment was. The cramps will occasionally wake him from sleep. He denies any radiating pain weakness or numbness. He has not changed any of his medications recently. He notes in the past he's had some muscle soreness from simvastatin but he continues to take it.   Past Medical History  Diagnosis Date  . Insulin resistance   . LV dysfunction     reduction  . Osteoarthritis   . Hearing aid worn   . Abdominal wall defect, acquired   . Blockage of coronary artery of heart     30%   Past Surgical History  Procedure Laterality Date  . Acne cyst removal  2012    Back   . Appendectomy    . Tonsillectomy    . Cardiac catheterization    . Rt hip replacement    . Colectomy  11/2013    [facility], 2nd to diverticulitis   Social History  Substance Use Topics  . Smoking status: Former Smoker    Quit date: 12/21/1985  . Smokeless tobacco: Not on file  . Alcohol Use: No   family history includes Heart disease in his mother.  ROS as above Medications: Current Outpatient Prescriptions  Medication Sig Dispense Refill  . fluticasone (FLONASE) 50 MCG/ACT nasal spray Place 1-2 sprays into both nostrils daily. 16 g 11  . levothyroxine (SYNTHROID, LEVOTHROID) 75 MCG tablet Take 1 tablet by mouth  daily 90 tablet 1  . meloxicam (MOBIC) 15 MG tablet Take 1 tablet by mouth  every day as needed 90 tablet 1  . metoprolol (LOPRESSOR) 50 MG tablet Take 50 mg by mouth.    .  Multiple Vitamin (MULTIVITAMIN) tablet Take 1 tablet by mouth daily.      [name] omeprazole (PRILOSEC) 20 MG capsule Take 20 mg by mouth daily.      . simvastatin (ZOCOR) 40 MG tablet Take 40 mg by mouth.     No current facility-administered medications for this visit.   Allergies  Allergen Reactions  . Flagyl [Metronidazole] Nausea And Vomiting  . Lipitor [Atorvastatin] Other (See Comments)    Myalgia     Exam:  BP 130/59 mmHg  Pulse 69  Temp(Src) 98.2 F (36.8 C) (Oral)  Wt 190 lb (86.183 kg) Gen: Well NAD HEENT: EOMI,  MMM Lungs: Normal work of breathing. CTABL Heart: RRR no MRG Abd: NABS, Soft. Nondistended, Nontender Exts: Brisk capillary refill, warm and well perfused.  Skin: No central back skin erythematous tender fluctuant lesion with surrounding erythema. Legs: Normal-appearing nontender no cords or erythema palpated.  Abscess incision and drainage. Consent obtained and timeout performed. Skin cleaned with alcohol, and cold spray applied. 1 mL of lidocaine without epinephrine injected achieving good anesthesia. Skin was again cleaned with alcohol. A sharp incision was made to the area of fluctuance. The incision was widened and sebaceous material was expressed. Sebaceous material was cultured. Blunt dissection was used to break up loculations. Further pus was expressed. Patient tolerated  the procedure well. A dressing was applied   No results found for this or any previous visit (from the past 24 hour(s)). No results found.   Please see individual assessment and plan sections.

## 2015-09-03 NOTE — Assessment & Plan Note (Signed)
Unclear etiology may be multifactorial. History of hypokalemia in the past. Check metabolic panel and CK as patient is on a statin. Additionally check CBC. Follow up if no improvement. May consider switching to pitavastatin if no better and no other cause discovered.

## 2015-09-05 LAB — COMPREHENSIVE METABOLIC PANEL
ALK PHOS: 38 U/L — AB (ref 40–115)
ALT: 18 U/L (ref 9–46)
AST: 22 U/L (ref 10–35)
Albumin: 4 g/dL (ref 3.6–5.1)
BUN: 17 mg/dL (ref 7–25)
CALCIUM: 9.3 mg/dL (ref 8.6–10.3)
CO2: 26 mmol/L (ref 20–31)
Chloride: 107 mmol/L (ref 98–110)
Creat: 0.75 mg/dL (ref 0.70–1.18)
GLUCOSE: 98 mg/dL (ref 65–99)
POTASSIUM: 4.7 mmol/L (ref 3.5–5.3)
Sodium: 142 mmol/L (ref 135–146)
Total Bilirubin: 0.6 mg/dL (ref 0.2–1.2)
Total Protein: 6 g/dL — ABNORMAL LOW (ref 6.1–8.1)

## 2015-09-05 LAB — CBC
HEMATOCRIT: 38.3 % — AB (ref 39.0–52.0)
Hemoglobin: 12.6 g/dL — ABNORMAL LOW (ref 13.0–17.0)
MCH: 29.6 pg (ref 26.0–34.0)
MCHC: 32.9 g/dL (ref 30.0–36.0)
MCV: 90.1 fL (ref 78.0–100.0)
MPV: 9.2 fL (ref 8.6–12.4)
PLATELETS: 189 10*3/uL (ref 150–400)
RBC: 4.25 MIL/uL (ref 4.22–5.81)
RDW: 15.3 % (ref 11.5–15.5)
WBC: 6.7 10*3/uL (ref 4.0–10.5)

## 2015-09-05 LAB — CK: Total CK: 53 U/L (ref 7–232)

## 2015-09-06 NOTE — Progress Notes (Signed)
Quick Note:  Labs do not show explanation for cramping. F/u with PCP ______

## 2015-09-07 LAB — CULTURE, ROUTINE-ABSCESS
GRAM STAIN: NONE SEEN
Gram Stain: NONE SEEN

## 2015-09-08 ENCOUNTER — Other Ambulatory Visit: Payer: Self-pay | Admitting: Family Medicine

## 2015-09-10 ENCOUNTER — Ambulatory Visit (INDEPENDENT_AMBULATORY_CARE_PROVIDER_SITE_OTHER): Payer: Medicare Other | Admitting: Family Medicine

## 2015-09-10 ENCOUNTER — Encounter: Payer: Self-pay | Admitting: Family Medicine

## 2015-09-10 VITALS — BP 145/70 | HR 68 | Temp 98.2°F | Ht 70.5 in | Wt 191.0 lb

## 2015-09-10 DIAGNOSIS — Z23 Encounter for immunization: Secondary | ICD-10-CM

## 2015-09-10 DIAGNOSIS — I2510 Atherosclerotic heart disease of native coronary artery without angina pectoris: Secondary | ICD-10-CM

## 2015-09-10 DIAGNOSIS — L989 Disorder of the skin and subcutaneous tissue, unspecified: Secondary | ICD-10-CM

## 2015-09-10 DIAGNOSIS — R634 Abnormal weight loss: Secondary | ICD-10-CM | POA: Diagnosis not present

## 2015-09-10 DIAGNOSIS — R252 Cramp and spasm: Secondary | ICD-10-CM

## 2015-09-10 NOTE — Progress Notes (Addendum)
   Subjective:    Patient ID: [name], male    DOB: 10-06-1941, 74 y.o.   MRN: [id number]  HPI  leg cramps x several months pt reports hx of this. bilateral leg cramping worse in R calf and foot does have some cramping in L foot wakes him up around 4-6 AM. He saw one of our other providers about add week ago for these symptoms. They did check some blood work including a CK and CMP which were normal. Did check a CBC. Hemoglobin was just a little borderline at 12.6. Had similar sxs a few years ago and it went away on its own.    F/u inflammed cyst/follicle - he really worries it is going to become infected again. Says he really thinks was from a spider bite as was in the yard cutting bushes the day before. It was abrupt in onset. Says has had epidermal cyst in the past and this felt different.   Weight loss- he's also concerned that he has been gradually losing losing weight since January without purposely attempting to lose weight. Though he did gain 1 pound since I last saw him so he feels a little bit reassured by that. He denies any odd symptoms are unusual fevers etc.  Review of Systems     Objective:   Physical Exam  Constitutional: He is oriented to person, place, and time. He appears well-developed and well-nourished.  HENT:  Head: Normocephalic and atraumatic.  Neurological: He is alert and oriented to person, place, and time.  Skin: Skin is warm and dry.  On center of back has an apporx 1 cm pink lesion with some scale around the edge and a dark scab in the cneter. No induration or active drianage.   Psychiatric: He has a normal mood and affect. His behavior is normal.          Assessment & Plan:  Leg cramps- normal labs ruling out additonal causes.  Discussed tx options. We could consider changing statin. livalo not on insurance.  Work on hhydration, start otc magnesium.  Work on gentle stretches. If not improving after 1 month then consider changing statin to Crestor.     Inflammed follicle vs insect bite.  Gave reassurance - good signs of healing. Call if any resucrrence of sxs.     Weight loss-he did gain back a pound today so that's reassuring. I just encouraged him that we will just keep an eye on it from here on out. Certainly if he starts to notice any constitutional symptoms then please let me know. He does feel like he is eating regularly.  Normal thyroid level in Jan. Consider rechecking. Colonoscopy is UTD.

## 2015-10-01 DIAGNOSIS — I480 Paroxysmal atrial fibrillation: Secondary | ICD-10-CM | POA: Diagnosis not present

## 2015-10-01 DIAGNOSIS — I340 Nonrheumatic mitral (valve) insufficiency: Secondary | ICD-10-CM | POA: Diagnosis not present

## 2015-10-01 DIAGNOSIS — I2510 Atherosclerotic heart disease of native coronary artery without angina pectoris: Secondary | ICD-10-CM | POA: Diagnosis not present

## 2015-10-01 DIAGNOSIS — R0602 Shortness of breath: Secondary | ICD-10-CM | POA: Diagnosis not present

## 2015-10-01 DIAGNOSIS — I255 Ischemic cardiomyopathy: Secondary | ICD-10-CM | POA: Diagnosis not present

## 2015-10-18 DIAGNOSIS — R0602 Shortness of breath: Secondary | ICD-10-CM | POA: Diagnosis not present

## 2015-10-18 DIAGNOSIS — I480 Paroxysmal atrial fibrillation: Secondary | ICD-10-CM | POA: Diagnosis not present

## 2015-10-18 DIAGNOSIS — I255 Ischemic cardiomyopathy: Secondary | ICD-10-CM | POA: Diagnosis not present

## 2015-10-18 DIAGNOSIS — I2510 Atherosclerotic heart disease of native coronary artery without angina pectoris: Secondary | ICD-10-CM | POA: Diagnosis not present

## 2015-10-18 DIAGNOSIS — I340 Nonrheumatic mitral (valve) insufficiency: Secondary | ICD-10-CM | POA: Diagnosis not present

## 2015-11-25 ENCOUNTER — Other Ambulatory Visit: Payer: Self-pay | Admitting: Family Medicine

## 2016-01-06 DIAGNOSIS — R9431 Abnormal electrocardiogram [ECG] [EKG]: Secondary | ICD-10-CM | POA: Diagnosis not present

## 2016-01-06 DIAGNOSIS — I255 Ischemic cardiomyopathy: Secondary | ICD-10-CM | POA: Diagnosis not present

## 2016-01-06 DIAGNOSIS — I340 Nonrheumatic mitral (valve) insufficiency: Secondary | ICD-10-CM | POA: Diagnosis not present

## 2016-01-06 DIAGNOSIS — I480 Paroxysmal atrial fibrillation: Secondary | ICD-10-CM | POA: Diagnosis not present

## 2016-01-06 DIAGNOSIS — I429 Cardiomyopathy, unspecified: Secondary | ICD-10-CM | POA: Diagnosis not present

## 2016-01-06 DIAGNOSIS — I493 Ventricular premature depolarization: Secondary | ICD-10-CM | POA: Diagnosis not present

## 2016-01-06 DIAGNOSIS — I2510 Atherosclerotic heart disease of native coronary artery without angina pectoris: Secondary | ICD-10-CM | POA: Diagnosis not present

## 2016-01-08 DIAGNOSIS — I480 Paroxysmal atrial fibrillation: Secondary | ICD-10-CM | POA: Diagnosis not present

## 2016-01-08 DIAGNOSIS — I429 Cardiomyopathy, unspecified: Secondary | ICD-10-CM | POA: Diagnosis not present

## 2016-01-08 DIAGNOSIS — I340 Nonrheumatic mitral (valve) insufficiency: Secondary | ICD-10-CM | POA: Diagnosis not present

## 2016-01-08 DIAGNOSIS — I493 Ventricular premature depolarization: Secondary | ICD-10-CM | POA: Diagnosis not present

## 2016-01-08 DIAGNOSIS — R9431 Abnormal electrocardiogram [ECG] [EKG]: Secondary | ICD-10-CM | POA: Diagnosis not present

## 2016-01-09 ENCOUNTER — Encounter: Payer: Self-pay | Admitting: Family Medicine

## 2016-01-09 ENCOUNTER — Ambulatory Visit (INDEPENDENT_AMBULATORY_CARE_PROVIDER_SITE_OTHER): Payer: Medicare HMO | Admitting: Family Medicine

## 2016-01-09 VITALS — BP 133/52 | HR 62 | Ht 70.5 in | Wt 197.0 lb

## 2016-01-09 DIAGNOSIS — R1013 Epigastric pain: Secondary | ICD-10-CM | POA: Diagnosis not present

## 2016-01-09 DIAGNOSIS — E038 Other specified hypothyroidism: Secondary | ICD-10-CM | POA: Diagnosis not present

## 2016-01-09 DIAGNOSIS — I2510 Atherosclerotic heart disease of native coronary artery without angina pectoris: Secondary | ICD-10-CM

## 2016-01-09 DIAGNOSIS — R252 Cramp and spasm: Secondary | ICD-10-CM

## 2016-01-09 DIAGNOSIS — I429 Cardiomyopathy, unspecified: Secondary | ICD-10-CM

## 2016-01-09 NOTE — Progress Notes (Signed)
   Subjective:    Patient ID: [name], male    DOB: [birth year], 75 y.o.   MRN: [id number]  HPI Leg cramps - they have gotten better. He is not taking a magnesium.  Has had soreness he relates to the simvastatin.    CAD, Cardiomyopathy- just saw Dr. Beverlyn Roux yesterday. Has repeat echo recently.   Occ epigastric pressure and some nausea.  Thinks happens more after eats but not sure.  Denies any pain.  No hx of GB removal. No vomiting.  No GERD. He takes a PPI daily.   Hypothyroid - no skin or hair changes.  No change in energy level.   Review of Systems     Objective:   Physical Exam  Constitutional: He is oriented to person, place, and time. He appears well-developed and well-nourished.  HENT:  Head: Normocephalic and atraumatic.  Neck: No thyromegaly present.  Cardiovascular: Normal rate, regular rhythm and normal heart sounds.   Pulmonary/Chest: Effort normal and breath sounds normal.  Neurological: He is alert and oriented to person, place, and time.  Skin: Skin is warm and dry.  Psychiatric: He has a normal mood and affect. His behavior is normal.          Assessment & Plan:  Leg cramps - Improved on their own. No intervention needed.  CAD, Cardiomyopathy- just saw Dr. Mauricio Po yesterday and is stable.  Hypothyroid - due to recheck TSH.  Asymptomatic.   Epigastric pressure - likely hiatal hernia. He is are any on a PPI. If pain gets worse or persists then consider further evaluation for cholecystitis and gastritis. Would likely need EGD for further evaluation.

## 2016-01-21 ENCOUNTER — Encounter: Payer: Self-pay | Admitting: Family Medicine

## 2016-01-28 ENCOUNTER — Other Ambulatory Visit: Payer: Self-pay

## 2016-01-28 MED ORDER — LEVOTHYROXINE SODIUM 75 MCG PO TABS: 75.0000 ug | ORAL_TABLET | Freq: Every day | ORAL | Status: DC

## 2016-01-28 MED ORDER — MELOXICAM 15 MG PO TABS: 15.0000 mg | ORAL_TABLET | Freq: Every day | ORAL | Status: DC

## 2016-01-29 DIAGNOSIS — E038 Other specified hypothyroidism: Secondary | ICD-10-CM | POA: Diagnosis not present

## 2016-01-29 DIAGNOSIS — I2510 Atherosclerotic heart disease of native coronary artery without angina pectoris: Secondary | ICD-10-CM | POA: Diagnosis not present

## 2016-01-30 DIAGNOSIS — Z96642 Presence of left artificial hip joint: Secondary | ICD-10-CM | POA: Diagnosis not present

## 2016-01-30 LAB — COMPLETE METABOLIC PANEL WITH GFR
ALT: 10 U/L (ref 9–46)
AST: 17 U/L (ref 10–35)
Albumin: 3.9 g/dL (ref 3.6–5.1)
Alkaline Phosphatase: 33 U/L — ABNORMAL LOW (ref 40–115)
BUN: 17 mg/dL (ref 7–25)
CALCIUM: 8.7 mg/dL (ref 8.6–10.3)
CHLORIDE: 106 mmol/L (ref 98–110)
CO2: 26 mmol/L (ref 20–31)
CREATININE: 0.8 mg/dL (ref 0.70–1.18)
GFR, EST NON AFRICAN AMERICAN: 89 mL/min (ref >=60)
Glucose, Bld: 103 mg/dL — ABNORMAL HIGH (ref 65–99)
POTASSIUM: 4.5 mmol/L (ref 3.5–5.3)
Sodium: 140 mmol/L (ref 135–146)
Total Bilirubin: 0.8 mg/dL (ref 0.2–1.2)
Total Protein: 5.9 g/dL — ABNORMAL LOW (ref 6.1–8.1)

## 2016-01-30 LAB — LIPID PANEL
Cholesterol: 148 mg/dL (ref 125–200)
HDL: 30 mg/dL — AB (ref >=40)
LDL CALC: 95 mg/dL (ref <130)
Total CHOL/HDL Ratio: 4.9 Ratio (ref <=5.0)
Triglycerides: 115 mg/dL (ref <150)
VLDL: 23 mg/dL (ref <30)

## 2016-01-30 LAB — TSH: TSH: 1.46 m[IU]/L (ref 0.40–4.50)

## 2016-03-06 ENCOUNTER — Ambulatory Visit (INDEPENDENT_AMBULATORY_CARE_PROVIDER_SITE_OTHER): Payer: Medicare HMO | Admitting: Family Medicine

## 2016-03-06 ENCOUNTER — Encounter: Payer: Self-pay | Admitting: Family Medicine

## 2016-03-06 VITALS — BP 139/78 | HR 71 | Wt 193.0 lb

## 2016-03-06 DIAGNOSIS — L309 Dermatitis, unspecified: Secondary | ICD-10-CM | POA: Diagnosis not present

## 2016-03-06 DIAGNOSIS — R21 Rash and other nonspecific skin eruption: Secondary | ICD-10-CM

## 2016-03-06 DIAGNOSIS — K2970 Gastritis, unspecified, without bleeding: Secondary | ICD-10-CM

## 2016-03-06 NOTE — Progress Notes (Signed)
   Subjective:    Patient ID: [name], male    DOB: Jun 05, 1941, 75 y.o.   MRN: [id number]  HPI Patient comes in today with a rash that he noticed yesterday and just above his left outer ankle. He said the area has been Itching on and off for a couple of weeks but did not notice an actual rash until yesterday. No fevers chills or sweats. No cracks in the skin or drainage. He's never had problems with his skin previously.  He also has concerns about his reflux medication. He says typically in the morning after he drinks his coffee and she feels a little bit nauseated. He doesn't eat breakfast until much later. He does take a PPI but he actually takes it after breakfast. He was started on the couple years ago when he was given meloxicam to take for his arthritis. He is also worried about the long-term effects of PPIs.  Review of Systems     Objective:   Physical Exam  Constitutional: He is oriented to person, place, and time. He appears well-developed and well-nourished.  HENT:  Head: Normocephalic and atraumatic.  Eyes: Conjunctivae and EOM are normal.  Cardiovascular: Normal rate.   Pulmonary/Chest: Effort normal.  Neurological: He is alert and oriented to person, place, and time.  Skin: Skin is dry. No pallor.  He has an erythematous dry scaling rash on the left ankle just above the malleolus. And a few erythematous papules above that. No actual breaks of skin or drainage.  Psychiatric: He has a normal mood and affect. His behavior is normal.  Vitals reviewed.         Assessment & Plan:   Dermatitis versus tinea-KOH skin scraping performed. Will call with results once available.  Gastritis-we did discuss that there are long-term effects of PPIs to be concerned with. It's also a risk benefit since he is taking anti-inflammatory fairly frequently for his arthritis we also don't want him to have a bleeding ulcer. I also discussed with him to make sure that he takes the PPI at  least 30 minutes before his first meal of the day for 2 be most effective as he's actually been taking it after his first meal of the day.

## 2016-03-09 LAB — FUNGAL STAIN

## 2016-03-10 MED ORDER — TRIAMCINOLONE ACETONIDE 0.5 % EX OINT: 1.0000 "application " | TOPICAL_OINTMENT | Freq: Two times a day (BID) | CUTANEOUS | Status: DC

## 2016-03-10 NOTE — Addendum Note (Signed)
Addended by: Beatrice Lecher D on: 03/10/2016 04:49 PM   Modules accepted: Orders

## 2016-05-08 DIAGNOSIS — S61012A Laceration without foreign body of left thumb without damage to nail, initial encounter: Secondary | ICD-10-CM | POA: Diagnosis not present

## 2016-05-21 ENCOUNTER — Ambulatory Visit: Payer: Medicare HMO | Admitting: Family Medicine

## 2016-05-29 DIAGNOSIS — M9901 Segmental and somatic dysfunction of cervical region: Secondary | ICD-10-CM | POA: Diagnosis not present

## 2016-05-29 DIAGNOSIS — M9900 Segmental and somatic dysfunction of head region: Secondary | ICD-10-CM | POA: Diagnosis not present

## 2016-06-15 DIAGNOSIS — H524 Presbyopia: Secondary | ICD-10-CM | POA: Diagnosis not present

## 2016-07-06 DIAGNOSIS — I428 Other cardiomyopathies: Secondary | ICD-10-CM | POA: Diagnosis not present

## 2016-07-06 DIAGNOSIS — I340 Nonrheumatic mitral (valve) insufficiency: Secondary | ICD-10-CM | POA: Diagnosis not present

## 2016-07-06 DIAGNOSIS — R0609 Other forms of dyspnea: Secondary | ICD-10-CM | POA: Diagnosis not present

## 2016-07-06 DIAGNOSIS — I480 Paroxysmal atrial fibrillation: Secondary | ICD-10-CM | POA: Diagnosis not present

## 2016-07-06 DIAGNOSIS — I2510 Atherosclerotic heart disease of native coronary artery without angina pectoris: Secondary | ICD-10-CM | POA: Diagnosis not present

## 2016-07-08 ENCOUNTER — Ambulatory Visit (INDEPENDENT_AMBULATORY_CARE_PROVIDER_SITE_OTHER): Payer: Medicare HMO | Admitting: Family Medicine

## 2016-07-08 ENCOUNTER — Encounter: Payer: Self-pay | Admitting: Family Medicine

## 2016-07-08 VITALS — BP 130/66 | HR 71 | Wt 188.0 lb

## 2016-07-08 DIAGNOSIS — R6881 Early satiety: Secondary | ICD-10-CM | POA: Diagnosis not present

## 2016-07-08 DIAGNOSIS — E038 Other specified hypothyroidism: Secondary | ICD-10-CM | POA: Diagnosis not present

## 2016-07-08 DIAGNOSIS — Z125 Encounter for screening for malignant neoplasm of prostate: Secondary | ICD-10-CM | POA: Diagnosis not present

## 2016-07-08 DIAGNOSIS — E7800 Pure hypercholesterolemia, unspecified: Secondary | ICD-10-CM | POA: Diagnosis not present

## 2016-07-08 DIAGNOSIS — R030 Elevated blood-pressure reading, without diagnosis of hypertension: Secondary | ICD-10-CM | POA: Diagnosis not present

## 2016-07-08 DIAGNOSIS — R7309 Other abnormal glucose: Secondary | ICD-10-CM | POA: Diagnosis not present

## 2016-07-08 LAB — CBC WITH DIFFERENTIAL/PLATELET
BASOS ABS: 0 {cells}/uL (ref 0–200)
BASOS PCT: 0 %
EOS ABS: 228 {cells}/uL (ref 15–500)
Eosinophils Relative: 3 %
HEMATOCRIT: 42.1 % (ref 38.5–50.0)
Hemoglobin: 13.9 g/dL (ref 13.2–17.1)
LYMPHS PCT: 17 %
Lymphs Abs: 1292 cells/uL (ref 850–3900)
MCH: 29.3 pg (ref 27.0–33.0)
MCHC: 33 g/dL (ref 32.0–36.0)
MCV: 88.8 fL (ref 80.0–100.0)
MONO ABS: 684 {cells}/uL (ref 200–950)
MPV: 9.7 fL (ref 7.5–12.5)
Monocytes Relative: 9 %
NEUTROS PCT: 71 %
Neutro Abs: 5396 cells/uL (ref 1500–7800)
Platelets: 191 10*3/uL (ref 140–400)
RBC: 4.74 MIL/uL (ref 4.20–5.80)
RDW: 14.4 % (ref 11.0–15.0)
WBC: 7.6 10*3/uL (ref 3.8–10.8)

## 2016-07-08 LAB — BASIC METABOLIC PANEL
BUN: 17 mg/dL (ref 7–25)
CHLORIDE: 104 mmol/L (ref 98–110)
CO2: 24 mmol/L (ref 20–31)
Calcium: 9.3 mg/dL (ref 8.6–10.3)
Creat: 0.91 mg/dL (ref 0.70–1.18)
Glucose, Bld: 96 mg/dL (ref 65–99)
POTASSIUM: 5.2 mmol/L (ref 3.5–5.3)
SODIUM: 139 mmol/L (ref 135–146)

## 2016-07-08 LAB — HEMOGLOBIN A1C
Hgb A1c MFr Bld: 5.5 % (ref <5.7)
Mean Plasma Glucose: 111 mg/dL

## 2016-07-08 LAB — TSH: TSH: 0.69 m[IU]/L (ref 0.40–4.50)

## 2016-07-08 NOTE — Progress Notes (Signed)
Subjective:    CC: HTN   HPI:  Hypertension- Pt denies chest pain, SOB, dizziness, or heart palpitations.  Taking meds as directed w/o problems.  Denies medication side effects.  Reports at home his BPs have been elevated.  He takes his pressures every other day. Mostly running in the [id number] and 0000000 systolic. He says his diastolics are usually normal. He does exercise regularly for about 20-30 minutes on the treadmill.  He is been having episodes of heart pounding mostly in the morning. He says a couple hours into the morning it seems to go away and doesn't bother him anymore. He said he had it years ago was diagnosed with PVCs that he is Artie on a beta blocker. He did speak with his cardiologist about this and they're scheduling him for a stress test.  Feels like he has been losing weight without trying. Though admits has been eating less and has been going to the gym more. He feels like he is getting full much more quickly. He says if he goes out to eat usually orders and appetizer and then can't finish it. He is having early satiety. He denies any abdominal pain or change in stool.  Saw cardiology yesterday and they are scheduling him for a treadmill stress test.    He reports that he occasionally feels short of breath with activity. He says he was actually diagnosed formally with COPD back in 2014. He was on inhalers at the time it felt like they were not helpful and were very costly so he discontinued them.   Past medical history, Surgical history, Family history not pertinant except as noted below, Social history, Allergies, and medications have been entered into the medical record, reviewed, and corrections made.   Review of Systems: No fevers, chills, night sweats, weight loss, chest pain, or shortness of breath.   Objective:    General: Well Developed, well nourished, and in no acute distress.  Neuro: Alert and oriented x3, extra-ocular muscles intact, sensation grossly intact.   HEENT: Normocephalic, atraumatic  Skin: Warm and dry, no rashes. Cardiac: Regular rate and rhythm, no murmurs rubs or gallops, no lower extremity edema.  Respiratory: Clear to auscultation bilaterally. Not using accessory muscles, speaking in full sentences.   Impression and Recommendations:   Hypertension-looks well controlled today here in the office but since he's been getting some high pressures at home we could certainly consider increasing his lisinopril to 10 mg. To recheck kidney function.  Weight loss-can certainly recheck his thyroid. It looked great in February. We can also check a PSA as he is due for that. His colonoscopy is up-to-date.  Abnormal glucose-check hemoglobin A1c.  Early satiety-we'll do some blood work today. If negative then consider referral to GI for further evaluation for possible endoscopy.  Heart pounding/palpitations-he is scheduled for a treadmill stress test.

## 2016-07-09 LAB — PSA: PSA: 0.85 ng/mL (ref <=4.00)

## 2016-07-09 NOTE — Addendum Note (Signed)
Addended by: Teddy Spike on: 07/09/2016 05:40 PM   Modules accepted: Orders

## 2016-08-11 DIAGNOSIS — L570 Actinic keratosis: Secondary | ICD-10-CM | POA: Diagnosis not present

## 2016-08-11 DIAGNOSIS — D2272 Melanocytic nevi of left lower limb, including hip: Secondary | ICD-10-CM | POA: Diagnosis not present

## 2016-08-11 DIAGNOSIS — D485 Neoplasm of uncertain behavior of skin: Secondary | ICD-10-CM | POA: Diagnosis not present

## 2016-08-11 DIAGNOSIS — D225 Melanocytic nevi of trunk: Secondary | ICD-10-CM | POA: Diagnosis not present

## 2016-08-12 DIAGNOSIS — I428 Other cardiomyopathies: Secondary | ICD-10-CM | POA: Diagnosis not present

## 2016-08-12 DIAGNOSIS — R0600 Dyspnea, unspecified: Secondary | ICD-10-CM | POA: Diagnosis not present

## 2016-08-12 DIAGNOSIS — R0609 Other forms of dyspnea: Secondary | ICD-10-CM | POA: Diagnosis not present

## 2016-08-12 DIAGNOSIS — I2510 Atherosclerotic heart disease of native coronary artery without angina pectoris: Secondary | ICD-10-CM | POA: Diagnosis not present

## 2016-08-18 DIAGNOSIS — M9901 Segmental and somatic dysfunction of cervical region: Secondary | ICD-10-CM | POA: Diagnosis not present

## 2016-08-18 DIAGNOSIS — M9902 Segmental and somatic dysfunction of thoracic region: Secondary | ICD-10-CM | POA: Diagnosis not present

## 2016-08-18 DIAGNOSIS — M9905 Segmental and somatic dysfunction of pelvic region: Secondary | ICD-10-CM | POA: Diagnosis not present

## 2016-08-21 DIAGNOSIS — M9902 Segmental and somatic dysfunction of thoracic region: Secondary | ICD-10-CM | POA: Diagnosis not present

## 2016-08-21 DIAGNOSIS — M9901 Segmental and somatic dysfunction of cervical region: Secondary | ICD-10-CM | POA: Diagnosis not present

## 2016-08-21 DIAGNOSIS — M9905 Segmental and somatic dysfunction of pelvic region: Secondary | ICD-10-CM | POA: Diagnosis not present

## 2016-08-27 DIAGNOSIS — M9905 Segmental and somatic dysfunction of pelvic region: Secondary | ICD-10-CM | POA: Diagnosis not present

## 2016-08-27 DIAGNOSIS — M9901 Segmental and somatic dysfunction of cervical region: Secondary | ICD-10-CM | POA: Diagnosis not present

## 2016-08-27 DIAGNOSIS — M9902 Segmental and somatic dysfunction of thoracic region: Secondary | ICD-10-CM | POA: Diagnosis not present

## 2016-09-01 DIAGNOSIS — M9903 Segmental and somatic dysfunction of lumbar region: Secondary | ICD-10-CM | POA: Diagnosis not present

## 2016-09-01 DIAGNOSIS — M9902 Segmental and somatic dysfunction of thoracic region: Secondary | ICD-10-CM | POA: Diagnosis not present

## 2016-09-01 DIAGNOSIS — M9901 Segmental and somatic dysfunction of cervical region: Secondary | ICD-10-CM | POA: Diagnosis not present

## 2016-09-01 DIAGNOSIS — M542 Cervicalgia: Secondary | ICD-10-CM | POA: Diagnosis not present

## 2016-09-03 DIAGNOSIS — M9901 Segmental and somatic dysfunction of cervical region: Secondary | ICD-10-CM | POA: Diagnosis not present

## 2016-09-03 DIAGNOSIS — M9903 Segmental and somatic dysfunction of lumbar region: Secondary | ICD-10-CM | POA: Diagnosis not present

## 2016-09-03 DIAGNOSIS — M9902 Segmental and somatic dysfunction of thoracic region: Secondary | ICD-10-CM | POA: Diagnosis not present

## 2016-09-03 DIAGNOSIS — M542 Cervicalgia: Secondary | ICD-10-CM | POA: Diagnosis not present

## 2016-09-07 DIAGNOSIS — M5416 Radiculopathy, lumbar region: Secondary | ICD-10-CM | POA: Diagnosis not present

## 2016-09-07 DIAGNOSIS — M4726 Other spondylosis with radiculopathy, lumbar region: Secondary | ICD-10-CM | POA: Diagnosis not present

## 2016-09-07 DIAGNOSIS — M5136 Other intervertebral disc degeneration, lumbar region: Secondary | ICD-10-CM | POA: Diagnosis not present

## 2016-09-08 ENCOUNTER — Encounter: Payer: Self-pay | Admitting: Family Medicine

## 2016-09-08 ENCOUNTER — Telehealth: Payer: Self-pay | Admitting: *Deleted

## 2016-09-08 ENCOUNTER — Ambulatory Visit (INDEPENDENT_AMBULATORY_CARE_PROVIDER_SITE_OTHER): Payer: Medicare HMO | Admitting: Family Medicine

## 2016-09-08 VITALS — BP 134/68 | HR 68 | Ht 70.5 in | Wt 192.0 lb

## 2016-09-08 DIAGNOSIS — R6881 Early satiety: Secondary | ICD-10-CM

## 2016-09-08 DIAGNOSIS — M542 Cervicalgia: Secondary | ICD-10-CM | POA: Diagnosis not present

## 2016-09-08 DIAGNOSIS — M9901 Segmental and somatic dysfunction of cervical region: Secondary | ICD-10-CM | POA: Diagnosis not present

## 2016-09-08 DIAGNOSIS — R635 Abnormal weight gain: Secondary | ICD-10-CM

## 2016-09-08 DIAGNOSIS — M9902 Segmental and somatic dysfunction of thoracic region: Secondary | ICD-10-CM | POA: Diagnosis not present

## 2016-09-08 DIAGNOSIS — E038 Other specified hypothyroidism: Secondary | ICD-10-CM | POA: Diagnosis not present

## 2016-09-08 DIAGNOSIS — M9903 Segmental and somatic dysfunction of lumbar region: Secondary | ICD-10-CM | POA: Diagnosis not present

## 2016-09-08 NOTE — Patient Instructions (Signed)
Go to the lab to recheck thyroid today. We will work on placing the referral for gastroenterology.

## 2016-09-08 NOTE — Telephone Encounter (Signed)
Error

## 2016-09-08 NOTE — Progress Notes (Signed)
Subjective:    CC: Weight loss  HPI:  Abnormal weight loss - he is up 4 lbs since last here.  He is on prednisone for his back. Just started it this morning. If this doesn't help he is planning on doing an epidural. He said last Wednesday had lasted a little over a year and he did well with it. He did try to purposely increase his caloric intake and has been able to gain weight since I last saw him.  Unfortunately he still feels like he is getting early satiety. He said he really just a small amount and he really feels full. That he denies any fever chills nausea vomiting or bloating.  Hypothyroidism-his last TSH was a little bit low at 0.69. I had encouraged him to cut 1 tab in half one day per week and then whole tab the other 6 days. He is due to repeat thyroid levels. He really hasn't noticed a difference in how he feels.  Past medical history, Surgical history, Family history not pertinant except as noted below, Social history, Allergies, and medications have been entered into the medical record, reviewed, and corrections made.   Review of Systems: No fevers, chills, night sweats, weight loss, chest pain, or shortness of breath.   Objective:    General: Well Developed, well nourished, and in no acute distress.  Neuro: Alert and oriented x3, extra-ocular muscles intact, sensation grossly intact.  HEENT: Normocephalic, atraumatic  Skin: Warm and dry, no rashes. Cardiac: Regular rate and rhythm, no murmurs rubs or gallops, no lower extremity edema.  Respiratory: Clear to auscultation bilaterally. Not using accessory muscles, speaking in full sentences. Abd: Soft normal bowel sounds nontender. No organomegaly.   Impression and Recommendations:    Abnormal weight loss - Weight is back up.  It is reassuring that he was able to increase his caloric intake and was able to bring his weight up. I am still concerned that he is having early satiety. Please see note below.  Hypothyroid - due  to recheck level since we adjusted his dose 2 months ago.  Early satiety - at this point his symptoms have persisted so I recommend referral to GI for further evaluation. Explained it can come from multiple different things including ulcers, gastritis and cancer.

## 2016-09-09 DIAGNOSIS — R69 Illness, unspecified: Secondary | ICD-10-CM | POA: Diagnosis not present

## 2016-09-10 DIAGNOSIS — M542 Cervicalgia: Secondary | ICD-10-CM | POA: Diagnosis not present

## 2016-09-10 DIAGNOSIS — M9902 Segmental and somatic dysfunction of thoracic region: Secondary | ICD-10-CM | POA: Diagnosis not present

## 2016-09-10 DIAGNOSIS — M9903 Segmental and somatic dysfunction of lumbar region: Secondary | ICD-10-CM | POA: Diagnosis not present

## 2016-09-10 DIAGNOSIS — M9901 Segmental and somatic dysfunction of cervical region: Secondary | ICD-10-CM | POA: Diagnosis not present

## 2016-09-15 DIAGNOSIS — M542 Cervicalgia: Secondary | ICD-10-CM | POA: Diagnosis not present

## 2016-09-15 DIAGNOSIS — M9901 Segmental and somatic dysfunction of cervical region: Secondary | ICD-10-CM | POA: Diagnosis not present

## 2016-09-15 DIAGNOSIS — M9902 Segmental and somatic dysfunction of thoracic region: Secondary | ICD-10-CM | POA: Diagnosis not present

## 2016-09-15 DIAGNOSIS — M9903 Segmental and somatic dysfunction of lumbar region: Secondary | ICD-10-CM | POA: Diagnosis not present

## 2016-09-17 DIAGNOSIS — M9902 Segmental and somatic dysfunction of thoracic region: Secondary | ICD-10-CM | POA: Diagnosis not present

## 2016-09-17 DIAGNOSIS — M9903 Segmental and somatic dysfunction of lumbar region: Secondary | ICD-10-CM | POA: Diagnosis not present

## 2016-09-17 DIAGNOSIS — M9901 Segmental and somatic dysfunction of cervical region: Secondary | ICD-10-CM | POA: Diagnosis not present

## 2016-09-17 DIAGNOSIS — M542 Cervicalgia: Secondary | ICD-10-CM | POA: Diagnosis not present

## 2016-09-22 DIAGNOSIS — M9903 Segmental and somatic dysfunction of lumbar region: Secondary | ICD-10-CM | POA: Diagnosis not present

## 2016-09-22 DIAGNOSIS — M542 Cervicalgia: Secondary | ICD-10-CM | POA: Diagnosis not present

## 2016-09-22 DIAGNOSIS — M9901 Segmental and somatic dysfunction of cervical region: Secondary | ICD-10-CM | POA: Diagnosis not present

## 2016-09-22 DIAGNOSIS — M9902 Segmental and somatic dysfunction of thoracic region: Secondary | ICD-10-CM | POA: Diagnosis not present

## 2016-09-23 DIAGNOSIS — R634 Abnormal weight loss: Secondary | ICD-10-CM | POA: Diagnosis not present

## 2016-09-23 DIAGNOSIS — R6881 Early satiety: Secondary | ICD-10-CM | POA: Diagnosis not present

## 2016-09-29 DIAGNOSIS — M9902 Segmental and somatic dysfunction of thoracic region: Secondary | ICD-10-CM | POA: Diagnosis not present

## 2016-09-29 DIAGNOSIS — M9901 Segmental and somatic dysfunction of cervical region: Secondary | ICD-10-CM | POA: Diagnosis not present

## 2016-09-29 DIAGNOSIS — M9903 Segmental and somatic dysfunction of lumbar region: Secondary | ICD-10-CM | POA: Diagnosis not present

## 2016-09-29 DIAGNOSIS — M542 Cervicalgia: Secondary | ICD-10-CM | POA: Diagnosis not present

## 2016-10-01 DIAGNOSIS — M9902 Segmental and somatic dysfunction of thoracic region: Secondary | ICD-10-CM | POA: Diagnosis not present

## 2016-10-01 DIAGNOSIS — M9903 Segmental and somatic dysfunction of lumbar region: Secondary | ICD-10-CM | POA: Diagnosis not present

## 2016-10-01 DIAGNOSIS — M9901 Segmental and somatic dysfunction of cervical region: Secondary | ICD-10-CM | POA: Diagnosis not present

## 2016-10-01 DIAGNOSIS — M542 Cervicalgia: Secondary | ICD-10-CM | POA: Diagnosis not present

## 2016-10-06 DIAGNOSIS — M9901 Segmental and somatic dysfunction of cervical region: Secondary | ICD-10-CM | POA: Diagnosis not present

## 2016-10-06 DIAGNOSIS — M542 Cervicalgia: Secondary | ICD-10-CM | POA: Diagnosis not present

## 2016-10-06 DIAGNOSIS — M9902 Segmental and somatic dysfunction of thoracic region: Secondary | ICD-10-CM | POA: Diagnosis not present

## 2016-10-06 DIAGNOSIS — M9903 Segmental and somatic dysfunction of lumbar region: Secondary | ICD-10-CM | POA: Diagnosis not present

## 2016-10-14 DIAGNOSIS — L570 Actinic keratosis: Secondary | ICD-10-CM | POA: Diagnosis not present

## 2016-10-14 DIAGNOSIS — L905 Scar conditions and fibrosis of skin: Secondary | ICD-10-CM | POA: Diagnosis not present

## 2016-10-14 DIAGNOSIS — L821 Other seborrheic keratosis: Secondary | ICD-10-CM | POA: Diagnosis not present

## 2016-10-14 DIAGNOSIS — I788 Other diseases of capillaries: Secondary | ICD-10-CM | POA: Diagnosis not present

## 2016-10-15 DIAGNOSIS — M542 Cervicalgia: Secondary | ICD-10-CM | POA: Diagnosis not present

## 2016-10-15 DIAGNOSIS — M9902 Segmental and somatic dysfunction of thoracic region: Secondary | ICD-10-CM | POA: Diagnosis not present

## 2016-10-15 DIAGNOSIS — M9901 Segmental and somatic dysfunction of cervical region: Secondary | ICD-10-CM | POA: Diagnosis not present

## 2016-10-15 DIAGNOSIS — M9903 Segmental and somatic dysfunction of lumbar region: Secondary | ICD-10-CM | POA: Diagnosis not present

## 2016-10-20 DIAGNOSIS — M9901 Segmental and somatic dysfunction of cervical region: Secondary | ICD-10-CM | POA: Diagnosis not present

## 2016-10-20 DIAGNOSIS — M542 Cervicalgia: Secondary | ICD-10-CM | POA: Diagnosis not present

## 2016-10-20 DIAGNOSIS — M9903 Segmental and somatic dysfunction of lumbar region: Secondary | ICD-10-CM | POA: Diagnosis not present

## 2016-10-20 DIAGNOSIS — M9902 Segmental and somatic dysfunction of thoracic region: Secondary | ICD-10-CM | POA: Diagnosis not present

## 2016-10-27 DIAGNOSIS — R6881 Early satiety: Secondary | ICD-10-CM | POA: Diagnosis not present

## 2016-10-27 DIAGNOSIS — R634 Abnormal weight loss: Secondary | ICD-10-CM | POA: Diagnosis not present

## 2016-11-03 DIAGNOSIS — M9903 Segmental and somatic dysfunction of lumbar region: Secondary | ICD-10-CM | POA: Diagnosis not present

## 2016-11-03 DIAGNOSIS — M542 Cervicalgia: Secondary | ICD-10-CM | POA: Diagnosis not present

## 2016-11-03 DIAGNOSIS — M9901 Segmental and somatic dysfunction of cervical region: Secondary | ICD-10-CM | POA: Diagnosis not present

## 2016-11-03 DIAGNOSIS — M9902 Segmental and somatic dysfunction of thoracic region: Secondary | ICD-10-CM | POA: Diagnosis not present

## 2016-11-04 ENCOUNTER — Encounter: Payer: Self-pay | Admitting: Family Medicine

## 2016-11-04 DIAGNOSIS — K3189 Other diseases of stomach and duodenum: Secondary | ICD-10-CM | POA: Diagnosis not present

## 2016-11-04 DIAGNOSIS — R6881 Early satiety: Secondary | ICD-10-CM | POA: Diagnosis not present

## 2016-11-10 DIAGNOSIS — M9901 Segmental and somatic dysfunction of cervical region: Secondary | ICD-10-CM | POA: Diagnosis not present

## 2016-11-14 ENCOUNTER — Other Ambulatory Visit: Payer: Self-pay | Admitting: Family Medicine

## 2016-11-17 DIAGNOSIS — M542 Cervicalgia: Secondary | ICD-10-CM | POA: Diagnosis not present

## 2016-11-17 DIAGNOSIS — M9902 Segmental and somatic dysfunction of thoracic region: Secondary | ICD-10-CM | POA: Diagnosis not present

## 2016-11-17 DIAGNOSIS — M9903 Segmental and somatic dysfunction of lumbar region: Secondary | ICD-10-CM | POA: Diagnosis not present

## 2016-11-17 DIAGNOSIS — M9901 Segmental and somatic dysfunction of cervical region: Secondary | ICD-10-CM | POA: Diagnosis not present

## 2016-12-03 DIAGNOSIS — M9902 Segmental and somatic dysfunction of thoracic region: Secondary | ICD-10-CM | POA: Diagnosis not present

## 2016-12-03 DIAGNOSIS — M9901 Segmental and somatic dysfunction of cervical region: Secondary | ICD-10-CM | POA: Diagnosis not present

## 2016-12-03 DIAGNOSIS — M542 Cervicalgia: Secondary | ICD-10-CM | POA: Diagnosis not present

## 2016-12-03 DIAGNOSIS — M9903 Segmental and somatic dysfunction of lumbar region: Secondary | ICD-10-CM | POA: Diagnosis not present

## 2016-12-16 ENCOUNTER — Other Ambulatory Visit: Payer: Self-pay | Admitting: Family Medicine

## 2016-12-23 DIAGNOSIS — M542 Cervicalgia: Secondary | ICD-10-CM | POA: Diagnosis not present

## 2016-12-23 DIAGNOSIS — M9902 Segmental and somatic dysfunction of thoracic region: Secondary | ICD-10-CM | POA: Diagnosis not present

## 2016-12-23 DIAGNOSIS — M9901 Segmental and somatic dysfunction of cervical region: Secondary | ICD-10-CM | POA: Diagnosis not present

## 2016-12-23 DIAGNOSIS — M9903 Segmental and somatic dysfunction of lumbar region: Secondary | ICD-10-CM | POA: Diagnosis not present

## 2017-01-08 ENCOUNTER — Other Ambulatory Visit: Payer: Self-pay | Admitting: *Deleted

## 2017-01-08 MED ORDER — SIMVASTATIN 40 MG PO TABS: 40.0000 mg | ORAL_TABLET | Freq: Every day | ORAL | 1 refills | Status: DC

## 2017-01-15 ENCOUNTER — Ambulatory Visit (INDEPENDENT_AMBULATORY_CARE_PROVIDER_SITE_OTHER): Payer: Medicare HMO | Admitting: Family Medicine

## 2017-01-15 VITALS — BP 158/82 | HR 79

## 2017-01-15 DIAGNOSIS — H8110 Benign paroxysmal vertigo, unspecified ear: Secondary | ICD-10-CM

## 2017-01-15 MED ORDER — MECLIZINE HCL 25 MG PO TABS: 25.0000 mg | ORAL_TABLET | Freq: Three times a day (TID) | ORAL | 0 refills | Status: DC | PRN

## 2017-01-15 MED ORDER — PREDNISONE 20 MG PO TABS: 40.0000 mg | ORAL_TABLET | Freq: Every day | ORAL | 0 refills | Status: DC

## 2017-01-15 MED ORDER — PROMETHAZINE HCL 25 MG/ML IJ SOLN
25.0000 mg | Freq: Once | INTRAMUSCULAR | Status: AC
  Administered 2017-01-15: 25 mg via INTRAMUSCULAR

## 2017-01-15 MED ORDER — PROMETHAZINE HCL 25 MG RE SUPP: 25.0000 mg | Freq: Four times a day (QID) | RECTAL | 0 refills | Status: DC | PRN

## 2017-01-15 NOTE — Progress Notes (Signed)
Subjective:    Patient ID: [name], male    DOB: 1941-12-20, 76 y.o.   MRN: [id number]  HPI 76 year old male with a history of atrial fibrillation and CAD comes in today complaining of dizziness.  Says change in motion cause him to feel nauseated and gag.  Started this AM around 10AM when he stood up from his recliner.  He said this morning when he first woke up he felt a little bit of pressure in his head almost like he was congested but didn't feel any actual nasal congestion. No recent upper respiratory illnesses. No fever or chills or sweats. His had some vertigo in the past but it's been years. He said he's feeling very nauseated with it and has been gagging and vomiting. He was only able to eat breakfast this morning. He denies a sensation of feeling like he is going to pass out. He says it really feels almost more like he's unsteady amiss like he is on a boat and can't quite get his equilibrium stabilized.   Review of Systems   BP (!) 172/74 (Patient Position: Sitting)   Pulse 79   SpO2 98%     Allergies  Allergen Reactions  . Flagyl [Metronidazole] Nausea And Vomiting  . Lipitor [Atorvastatin] Other (See Comments)    Myalgia    Past Medical History:  Diagnosis Date  . Abdominal wall defect, acquired   . Blockage of coronary artery of heart (HCC)    30%  . Hearing aid worn   . Insulin resistance   . LV dysfunction    reduction  . Osteoarthritis     Past Surgical History:  Procedure Laterality Date  . ACNE CYST REMOVAL  2012   Back   . APPENDECTOMY    . CARDIAC CATHETERIZATION    . COLECTOMY  11/2013   [facility], 2nd to diverticulitis  . RT hip replacement    . TONSILLECTOMY      Social History   Social History  . Marital status: Married    Spouse name: [name]  . Number of children: [name]  . Years of education: [name]   Occupational History  . 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 Smoking status: Former Smoker     Quit date: 12/21/1985  . Smokeless tobacco: Not on file  . Alcohol use No  . Drug use: No  . 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 Not on file   Social History Narrative   Born in [place].     Family History  Problem Relation Age of Onset  . Heart disease Mother     pacemaker    Outpatient Encounter Prescriptions as of 01/15/2017  Medication Sig  . aspirin EC 81 MG tablet Take 81 mg by mouth.  . levothyroxine (SYNTHROID, LEVOTHROID) 75 MCG tablet Take 1 tablet (75 mcg total) by mouth daily before breakfast. NEED FOLLOW UP APPOINTMENT FOR MORE REFILLS  . meclizine (ANTIVERT) 25 MG tablet Take 1 tablet (25 mg total) by mouth 3 (three) times daily as needed for dizziness.  . meloxicam (MOBIC) 15 MG tablet TAKE ONE TABLET BY MOUTH ONCE DAILY  . metoprolol (LOPRESSOR) 50 MG tablet Take 50 mg by mouth.  . Multiple Vitamin (MULTIVITAMIN) tablet Take 1 tablet by mouth daily.    [name] omeprazole (PRILOSEC) 20 MG capsule  Take 20 mg by mouth daily.    . predniSONE (STERAPRED UNI-PAK 21 TAB) 10 MG (21) TBPK tablet 6 day dose pack take as directed.  . promethazine (PHENERGAN) 25 MG suppository Place 1 suppository (25 mg total) rectally every 6 (six) hours as needed for nausea or vomiting.  . simvastatin (ZOCOR) 40 MG tablet Take 1 tablet (40 mg total) by mouth daily at 6 PM.   No facility-administered encounter medications on file as of 01/15/2017.          Objective:   Physical Exam  Constitutional: He is oriented to person, place, and time. He appears well-developed and well-nourished.  HENT:  Head: Normocephalic and atraumatic.  Right Ear: External ear normal.  Left Ear: External ear normal.  Nose: Nose normal.  Mouth/Throat: Oropharynx is clear and moist.  Eyes: Conjunctivae and EOM are normal. Pupils are equal, round, and reactive to light.  Neck: Neck supple.  Cardiovascular: Normal rate,  regular rhythm and normal heart sounds.   Pulmonary/Chest: Effort normal and breath sounds normal.  Neurological: He is alert and oriented to person, place, and time. No cranial nerve deficit.  Dix-Hallpike maneuver did increase his sensation of vertigo to the right and the left but wasn't one-sided which is atypical. He also had just some slight nystagmus when he was looking forward after sitting up. Again but not with turning his head to the right or left which again is unusual.  Skin: Skin is warm and dry.  Psychiatric: He has a normal mood and affect. His behavior is normal. Thought content normal.        Assessment & Plan:  Vertigo-most consistent with benign positional vertigo with abrupt onset. Given Phenergan IM injection here in the office because he was dry heaving with the dizziness. Will send her a prescription for Antivert and Phenergan suppositories. Call if over the weekend not improving, suddenly develops more upper respiratory symptoms, develops a fever, develops headache or just not improving. Handout given with home exercises to fatigue the response. Discussed diagnosis. Also given appropriate prescription for prednisone since he was feeling a lot of pressure in the head and ears this morning. If not better after week and then please give [place] call back. If he gets worse or continues to be nauseated or vomit them please let us know.

## 2017-01-20 DIAGNOSIS — R69 Illness, unspecified: Secondary | ICD-10-CM | POA: Diagnosis not present

## 2017-01-21 ENCOUNTER — Telehealth: Payer: Self-pay | Admitting: *Deleted

## 2017-01-21 DIAGNOSIS — M542 Cervicalgia: Secondary | ICD-10-CM | POA: Diagnosis not present

## 2017-01-21 DIAGNOSIS — M9903 Segmental and somatic dysfunction of lumbar region: Secondary | ICD-10-CM | POA: Diagnosis not present

## 2017-01-21 DIAGNOSIS — M9901 Segmental and somatic dysfunction of cervical region: Secondary | ICD-10-CM | POA: Diagnosis not present

## 2017-01-21 DIAGNOSIS — M9902 Segmental and somatic dysfunction of thoracic region: Secondary | ICD-10-CM | POA: Diagnosis not present

## 2017-01-21 NOTE — Telephone Encounter (Signed)
First prescribed on 1/26. Im just now seeing the prior auth for this medication. Called and left message for patient to see how he was doing and if still in need of suppositories

## 2017-01-22 DIAGNOSIS — R69 Illness, unspecified: Secondary | ICD-10-CM | POA: Diagnosis not present

## 2017-01-28 ENCOUNTER — Encounter: Payer: Self-pay | Admitting: Family Medicine

## 2017-01-28 ENCOUNTER — Ambulatory Visit (INDEPENDENT_AMBULATORY_CARE_PROVIDER_SITE_OTHER): Payer: Medicare HMO | Admitting: Family Medicine

## 2017-01-28 VITALS — BP 120/82 | HR 86 | Temp 99.4°F | Ht 71.0 in | Wt 194.0 lb

## 2017-01-28 DIAGNOSIS — J111 Influenza due to unidentified influenza virus with other respiratory manifestations: Secondary | ICD-10-CM | POA: Diagnosis not present

## 2017-01-28 MED ORDER — OSELTAMIVIR PHOSPHATE 75 MG PO CAPS: 75.0000 mg | ORAL_CAPSULE | Freq: Two times a day (BID) | ORAL | 0 refills | Status: DC

## 2017-01-28 NOTE — Progress Notes (Signed)
   Subjective:    Patient ID: [name], male    DOB: 1941-11-22, 76 y.o.   MRN: [id number]  HPI 76 year old male comes in today complaining of cough, fever and feeling achy 2 days  . He's had watery earning itchy eyes. Sore throat. Wife tested positive for influenza.No diarrhea or GI sxs.  Cough is mostly dry.  + bodyaches.    Review of Systems     Objective:   Physical Exam  Constitutional: He is oriented to person, place, and time. He appears well-developed and well-nourished.  HENT:  Head: Normocephalic and atraumatic.  Right Ear: External ear normal.  Left Ear: External ear normal.  Nose: Nose normal.  Mouth/Throat: Oropharynx is clear and moist.  TMs and canals are clear.   Eyes: Conjunctivae and EOM are normal. Pupils are equal, round, and reactive to light.  Neck: Neck supple. No thyromegaly present.  Cardiovascular: Normal rate and normal heart sounds.   Pulmonary/Chest: Effort normal and breath sounds normal.  Lymphadenopathy:    He has no cervical adenopathy.  Neurological: He is alert and oriented to person, place, and time.  Skin: Skin is warm and dry.  Psychiatric: He has a normal mood and affect.        Assessment & Plan:  Influenza-we'll treat with Tamiflu. Call if getting worse or if not improving after one week. One about potential side effects. Make sure hydrating well.

## 2017-01-28 NOTE — Patient Instructions (Addendum)

## 2017-01-29 ENCOUNTER — Ambulatory Visit (INDEPENDENT_AMBULATORY_CARE_PROVIDER_SITE_OTHER): Payer: Medicare HMO | Admitting: Physician Assistant

## 2017-01-29 ENCOUNTER — Ambulatory Visit (INDEPENDENT_AMBULATORY_CARE_PROVIDER_SITE_OTHER): Payer: Medicare HMO

## 2017-01-29 ENCOUNTER — Ambulatory Visit: Payer: Medicare HMO | Admitting: Family Medicine

## 2017-01-29 ENCOUNTER — Encounter: Payer: Self-pay | Admitting: Physician Assistant

## 2017-01-29 ENCOUNTER — Telehealth: Payer: Self-pay | Admitting: Family Medicine

## 2017-01-29 ENCOUNTER — Telehealth: Payer: Self-pay

## 2017-01-29 VITALS — BP 130/70 | HR 90 | Temp 100.6°F | Ht 69.25 in | Wt 191.6 lb

## 2017-01-29 DIAGNOSIS — R509 Fever, unspecified: Secondary | ICD-10-CM | POA: Diagnosis not present

## 2017-01-29 DIAGNOSIS — R918 Other nonspecific abnormal finding of lung field: Secondary | ICD-10-CM | POA: Diagnosis not present

## 2017-01-29 DIAGNOSIS — R110 Nausea: Secondary | ICD-10-CM | POA: Diagnosis not present

## 2017-01-29 DIAGNOSIS — I493 Ventricular premature depolarization: Secondary | ICD-10-CM | POA: Diagnosis not present

## 2017-01-29 DIAGNOSIS — J111 Influenza due to unidentified influenza virus with other respiratory manifestations: Secondary | ICD-10-CM | POA: Diagnosis not present

## 2017-01-29 DIAGNOSIS — Z96641 Presence of right artificial hip joint: Secondary | ICD-10-CM | POA: Diagnosis not present

## 2017-01-29 DIAGNOSIS — E079 Disorder of thyroid, unspecified: Secondary | ICD-10-CM | POA: Diagnosis not present

## 2017-01-29 DIAGNOSIS — M1990 Unspecified osteoarthritis, unspecified site: Secondary | ICD-10-CM | POA: Diagnosis not present

## 2017-01-29 DIAGNOSIS — B309 Viral conjunctivitis, unspecified: Secondary | ICD-10-CM | POA: Diagnosis not present

## 2017-01-29 DIAGNOSIS — J101 Influenza due to other identified influenza virus with other respiratory manifestations: Secondary | ICD-10-CM | POA: Diagnosis not present

## 2017-01-29 DIAGNOSIS — R05 Cough: Secondary | ICD-10-CM | POA: Diagnosis not present

## 2017-01-29 DIAGNOSIS — Z87891 Personal history of nicotine dependence: Secondary | ICD-10-CM | POA: Diagnosis not present

## 2017-01-29 DIAGNOSIS — J1100 Influenza due to unidentified influenza virus with unspecified type of pneumonia: Secondary | ICD-10-CM | POA: Diagnosis not present

## 2017-01-29 DIAGNOSIS — Z86711 Personal history of pulmonary embolism: Secondary | ICD-10-CM | POA: Diagnosis not present

## 2017-01-29 DIAGNOSIS — J181 Lobar pneumonia, unspecified organism: Secondary | ICD-10-CM | POA: Diagnosis not present

## 2017-01-29 DIAGNOSIS — Z888 Allergy status to other drugs, medicaments and biological substances status: Secondary | ICD-10-CM | POA: Diagnosis not present

## 2017-01-29 DIAGNOSIS — R1110 Vomiting, unspecified: Secondary | ICD-10-CM | POA: Diagnosis not present

## 2017-01-29 DIAGNOSIS — I2510 Atherosclerotic heart disease of native coronary artery without angina pectoris: Secondary | ICD-10-CM | POA: Diagnosis not present

## 2017-01-29 DIAGNOSIS — E785 Hyperlipidemia, unspecified: Secondary | ICD-10-CM | POA: Diagnosis not present

## 2017-01-29 DIAGNOSIS — J189 Pneumonia, unspecified organism: Secondary | ICD-10-CM

## 2017-01-29 DIAGNOSIS — R070 Pain in throat: Secondary | ICD-10-CM | POA: Diagnosis not present

## 2017-01-29 LAB — POCT INFLUENZA A/B
Influenza A, POC: NEGATIVE
Influenza B, POC: POSITIVE — AB

## 2017-01-29 MED ORDER — GUAIFENESIN-CODEINE 100-10 MG/5ML PO SYRP: 5.0000 mL | ORAL_SOLUTION | Freq: Three times a day (TID) | ORAL | 0 refills | Status: DC | PRN

## 2017-01-29 MED ORDER — PROMETHAZINE-PHENYLEPHRINE 6.25-5 MG/5ML PO SYRP: 5.0000 mL | ORAL_SOLUTION | ORAL | 0 refills | Status: DC | PRN

## 2017-01-29 MED ORDER — PROMETHAZINE-CODEINE 6.25-10 MG/5ML PO SYRP: 5.0000 mL | ORAL_SOLUTION | Freq: Four times a day (QID) | ORAL | 0 refills | Status: DC | PRN

## 2017-01-29 MED ORDER — PROMETHAZINE-DM 6.25-15 MG/5ML PO SYRP: 5.0000 mL | ORAL_SOLUTION | Freq: Every evening | ORAL | 0 refills | Status: DC | PRN

## 2017-01-29 MED ORDER — PROMETHAZINE HCL 25 MG/ML IJ SOLN
25.0000 mg | Freq: Once | INTRAMUSCULAR | Status: AC
  Administered 2017-01-29: 25 mg via INTRAMUSCULAR

## 2017-01-29 MED ORDER — ERYTHROMYCIN 5 MG/GM OP OINT: TOPICAL_OINTMENT | OPHTHALMIC | 0 refills | Status: DC

## 2017-01-29 MED ORDER — LEVOFLOXACIN 500 MG PO TABS: 500.0000 mg | ORAL_TABLET | Freq: Every day | ORAL | 0 refills | Status: DC

## 2017-01-29 NOTE — Telephone Encounter (Signed)
Ok, rx sent to [facility].

## 2017-01-29 NOTE — Telephone Encounter (Signed)
[name], [name], called and would like something called in for his cough. Please advise.

## 2017-01-29 NOTE — Patient Instructions (Signed)
It was great meeting you today!  We will call you with your chest xray results and send in an antibiotic if needed.  Stay hydrated. Please go the emergency room if worsening fever, trouble opening/closing mouth, difficulty swallowing, worsening shortness of breath, or worsening instead of improving as expected.   Influenza, Adult Influenza, more commonly known as "the flu," is a viral infection that primarily affects the respiratory tract. The respiratory tract includes organs that help you breathe, such as the lungs, nose, and throat. The flu causes many common cold symptoms, as well as a high fever and body aches. The flu spreads easily from person to person (is contagious). Getting a flu shot (influenza vaccination) every year is the best way to prevent influenza. What are the causes? Influenza is caused by a virus. You can catch the virus by:  Breathing in droplets from an infected person's cough or sneeze.  Touching something that was recently contaminated with the virus and then touching your mouth, nose, or eyes. What increases the risk? The following factors may make you more likely to get the flu:  Not cleaning your hands frequently with soap and water or alcohol-based hand sanitizer.  Having close contact with many people during cold and flu season.  Touching your mouth, eyes, or nose without washing or sanitizing your hands first.  Not drinking enough fluids or not eating a healthy diet.  Not getting enough sleep or exercise.  Being under a high amount of stress.  Not getting a yearly (annual) flu shot. You may be at a higher risk of complications from the flu, such as a severe lung infection (pneumonia), if you:  Are over the age of 56.  Are pregnant.  Have a weakened disease-fighting system (immune system). You may have a weakened immune system if you:  Have HIV or AIDS.  Are undergoing chemotherapy.  Aretaking medicines that reduce the activity of (suppress)  the immune system.  Have a long-term (chronic) illness, such as heart disease, kidney disease, diabetes, or lung disease.  Have a liver disorder.  Are obese.  Have anemia. What are the signs or symptoms? Symptoms of this condition typically last 4-10 days and may include:  Fever.  Chills.  Headache, body aches, or muscle aches.  Sore throat.  Cough.  Runny or congested nose.  Chest discomfort and cough.  Poor appetite.  Weakness or tiredness (fatigue).  Dizziness.  Nausea or vomiting. How is this diagnosed? This condition may be diagnosed based on your medical history and a physical exam. Your health care provider may do a nose or throat swab test to confirm the diagnosis. How is this treated? If influenza is detected early, you can be treated with antiviral medicine that can reduce the length of your illness and the severity of your symptoms. This medicine may be given by mouth (orally) or through an IV tube that is inserted in one of your veins. The goal of treatment is to relieve symptoms by taking care of yourself at home. This may include taking over-the-counter medicines, drinking plenty of fluids, and adding humidity to the air in your home. In some cases, influenza goes away on its own. Severe influenza or complications from influenza may be treated in a hospital. Follow these instructions at home:  Take over-the-counter and prescription medicines only as told by your health care provider.  Use a cool mist humidifier to add humidity to the air in your home. This can make breathing easier.  Rest as needed.  Drink enough fluid to keep your urine clear or pale yellow.  Cover your mouth and nose when you cough or sneeze.  Wash your hands with soap and water often, especially after you cough or sneeze. If soap and water are not available, use hand sanitizer.  Stay home from work or school as told by your health care provider. Unless you are visiting your health  care provider, try to avoid leaving home until your fever has been gone for 24 hours without the use of medicine.  Keep all follow-up visits as told by your health care provider. This is important. How is this prevented?  Getting an annual flu shot is the best way to avoid getting the flu. You may get the flu shot in late summer, fall, or winter. Ask your health care provider when you should get your flu shot.  Wash your hands often or use hand sanitizer often.  Avoid contact with people who are sick during cold and flu season.  Eat a healthy diet, drink plenty of fluids, get enough sleep, and exercise regularly. Contact a health care provider if:  You develop new symptoms.  You have:  Chest pain.  Diarrhea.  A fever.  Your cough gets worse.  You produce more mucus.  You feel nauseous or you vomit. Get help right away if:  You develop shortness of breath or difficulty breathing.  Your skin or nails turn a bluish color.  You have severe pain or stiffness in your neck.  You develop a sudden headache or sudden pain in your face or ear.  You cannot stop vomiting. This information is not intended to replace advice given to you by your health care provider. Make sure you discuss any questions you have with your health care provider. Document Released: 12/04/2000 Document Revised: 05/14/2016 Document Reviewed: 10/01/2015 Elsevier Interactive Patient Education  2017 [employer].

## 2017-01-29 NOTE — Progress Notes (Deleted)
OFFICE VISIT  01/29/2017   CC: No chief complaint on file.    HPI:    Patient is a 76 y.o. Caucasian male who presents for "flu-like symptoms".  Past Medical History:  Diagnosis Date  . Abdominal wall defect, acquired   . Blockage of coronary artery of heart (HCC)    30%  . Hearing aid worn   . Insulin resistance   . LV dysfunction    reduction  . Osteoarthritis     Past Surgical History:  Procedure Laterality Date  . ACNE CYST REMOVAL  2012   Back   . APPENDECTOMY    . CARDIAC CATHETERIZATION    . COLECTOMY  11/2013   [facility], 2nd to diverticulitis  . RT hip replacement    . TONSILLECTOMY      Outpatient Medications Prior to Visit  Medication Sig Dispense Refill  . aspirin EC 81 MG tablet Take 81 mg by mouth.    . levothyroxine (SYNTHROID, LEVOTHROID) 75 MCG tablet Take 1 tablet (75 mcg total) by mouth daily before breakfast. NEED FOLLOW UP APPOINTMENT FOR MORE REFILLS 30 tablet 0  . meclizine (ANTIVERT) 25 MG tablet Take 1 tablet (25 mg total) by mouth 3 (three) times daily as needed for dizziness. 30 tablet 0  . meloxicam (MOBIC) 15 MG tablet TAKE ONE TABLET BY MOUTH ONCE DAILY 90 tablet 1  . metoprolol (LOPRESSOR) 50 MG tablet Take 50 mg by mouth.    . Multiple Vitamin (MULTIVITAMIN) tablet Take 1 tablet by mouth daily.      [name] omeprazole (PRILOSEC) 20 MG capsule Take 20 mg by mouth daily.      [name] oseltamivir (TAMIFLU) 75 MG capsule Take 1 capsule (75 mg total) by mouth 2 (two) times daily. X 5 days. 10 capsule 0  . promethazine-dextromethorphan (PROMETHAZINE-DM) 6.25-15 MG/5ML syrup Take 5 mLs by mouth at bedtime as needed for cough. 118 mL 0  . simvastatin (ZOCOR) 40 MG tablet Take 1 tablet (40 mg total) by mouth daily at 6 PM. 90 tablet 1   No facility-administered medications prior to visit.     Allergies  Allergen Reactions  . Flagyl [Metronidazole] Nausea And Vomiting  . Lipitor [Atorvastatin] Other (See Comments)    Myalgia    ROS As per  HPI  PE: There were no vitals taken for this visit. ***  LABS:  ***  IMPRESSION AND PLAN:  No problem-specific Assessment & Plan notes found for this encounter.   FOLLOW UP: No Follow-up on file.

## 2017-01-29 NOTE — Progress Notes (Signed)
Subjective:    Patient ID: [name], male    DOB: April 05, 1941, 76 y.o.   MRN: [id number]  HPI  [name] is a 76 y/o male who presents today to establish care.  He is here for a sick visit. He was seen yesterday and diagnosed with the flu. He was given Tamiflu to take. His wife has also recently had the flu. He endoreses cough, fever, and achiness x 2 days. Fevers worsened this morning and now he is having nausea and vomiting. Wife is concerned about his coughing, weakness and poor appetite. She is pushing fluids. He was able to eat a grilled cheese sandwich last night, and he had some oatmeal this morning. He denies SOB or chest pain.  He called the office today where he was seen and the provider called in a prescription for his cough. He does not have a history of asthma or COPD. States that he had PNA "several decades" ago.   He also reports that both of his eyes are red and feel irritated. He did have note some crusting of his eyelids this morning.  Review of Systems  See HPI  Past Medical History:  Diagnosis Date  . Abdominal wall defect, acquired   . Blockage of coronary artery of heart (HCC)    30%  . Hearing aid worn   . Insulin resistance   . LV dysfunction    reduction  . Osteoarthritis      Social History   Social History  . Marital status: Married    Spouse name: [name]  . Number of children: [name]  . Years of education: [name]   Occupational History  . 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 Smoking status: Former Smoker    Quit date: 12/21/1985  . Smokeless tobacco: Never Used  . Alcohol use No  . Drug use: No  . 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 Not on file   Social History Narrative   Born in [place].     Past Surgical History:  Procedure Laterality Date  . ACNE CYST REMOVAL  2012   Back   . APPENDECTOMY    .  CARDIAC CATHETERIZATION    . COLECTOMY  11/2013   [facility], 2nd to diverticulitis  . RT hip replacement    . TONSILLECTOMY      Family History  Problem Relation Age of Onset  . Heart disease Mother     pacemaker    Allergies  Allergen Reactions  . Flagyl [Metronidazole] Nausea And Vomiting  . Lipitor [Atorvastatin] Other (See Comments)    Myalgia    Current Outpatient Prescriptions on File Prior to Visit  Medication Sig Dispense Refill  . aspirin EC 81 MG tablet Take 81 mg by mouth.    . levothyroxine (SYNTHROID, LEVOTHROID) 75 MCG tablet Take 1 tablet (75 mcg total) by mouth daily before breakfast. NEED FOLLOW UP APPOINTMENT FOR MORE REFILLS 30 tablet 0  . meclizine (ANTIVERT) 25 MG tablet Take 1 tablet (25 mg total) by mouth 3 (three) times daily as needed for dizziness. 30 tablet 0  . meloxicam (MOBIC) 15 MG tablet TAKE ONE TABLET BY MOUTH ONCE DAILY 90 tablet 1  . metoprolol (LOPRESSOR) 50 MG tablet Take 50 mg by mouth.    . Multiple Vitamin (MULTIVITAMIN) tablet Take 1  tablet by mouth daily.      [name] omeprazole (PRILOSEC) 20 MG capsule Take 20 mg by mouth daily.      [name] oseltamivir (TAMIFLU) 75 MG capsule Take 1 capsule (75 mg total) by mouth 2 (two) times daily. X 5 days. 10 capsule 0  . simvastatin (ZOCOR) 40 MG tablet Take 1 tablet (40 mg total) by mouth daily at 6 PM. 90 tablet 1   No current facility-administered medications on file prior to visit.     BP 130/70 (BP Location: Left Arm, Patient Position: Sitting, Cuff Size: Normal)   Pulse 90   Temp (!) 100.6 F (38.1 C) (Oral)   Ht 5' 9.25" (1.759 m)   Wt 191 lb 9.6 oz (86.9 kg)   SpO2 94%   BMI 28.09 kg/m       Objective:   Physical Exam  Constitutional: He is cooperative.  Non-toxic appearance. He does not have a sickly appearance. He appears ill.  HENT:  Head: Normocephalic and atraumatic.  Right Ear: Tympanic membrane, external ear and ear canal normal. Tympanic membrane is not erythematous, not retracted  and not bulging.  Left Ear: Tympanic membrane, external ear and ear canal normal. Tympanic membrane is not erythematous, not retracted and not bulging.  Nose: Right sinus exhibits no maxillary sinus tenderness and no frontal sinus tenderness. Left sinus exhibits no maxillary sinus tenderness and no frontal sinus tenderness.  Mouth/Throat: Uvula is midline. Posterior oropharyngeal edema and posterior oropharyngeal erythema present. No oropharyngeal exudate.  Eyes: Right conjunctiva is injected. Left conjunctiva is injected.  Neck: Trachea normal.  Cardiovascular: Normal rate, regular rhythm and normal heart sounds.   Pulmonary/Chest: Effort normal. No accessory muscle usage. No respiratory distress. He has decreased breath sounds in the left lower field. He has no wheezes. He has no rhonchi. He has no rales.  Lymphadenopathy:    He has cervical adenopathy.  Neurological: He is alert.  Skin: Skin is warm and dry.  Nursing note and vitals reviewed.  CXR PA and lateral: left lingular infiltrate  Results for orders placed or performed in visit on 01/29/17  POCT Influenza A/B  Result Value Ref Range   Influenza A, POC Negative Negative   Influenza B, POC Positive (A) Negative       Assessment & Plan:  1. Influenza B Continue Tamiflu per orders. - POCT Influenza A/B - DG Chest 2 View; Future  2. Nausea Phenergan injection in office. Phenergan with codeine syrup as needed for nausea and cough.  3. Acute viral conjunctivitis of both eyes Erythromycin ointment, apply to both eyes bilaterally.  4. Community acquired pneumonia of left lower lobe of lung ([place]) CXR shows left lingular infiltrate. Start Levaquin 500mg  x 5 days. Long discussion with patient and wife about when to seek medical attention for worsening symptoms, including worsening cough and SOB. Discussed need to push fluids and monitor symptoms for dehydration. Alternate tylenol and ibuprofen for fever/body aches. Reiterated with  wife that if she has any concerns about patient, she should take him to the emergency room. Follow-up with me in 1 week.  [name] PA-C 01/29/17

## 2017-01-29 NOTE — Telephone Encounter (Signed)
Left message advising of cough syrup.

## 2017-01-29 NOTE — Telephone Encounter (Signed)
Pt's wife called checking on cough med adv a phone note was sent. Thanks

## 2017-01-31 MED ORDER — TAMSULOSIN HCL 0.4 MG PO CAPS: 0.4000 mg | ORAL_CAPSULE | Freq: Every day | ORAL | 3 refills | Status: DC

## 2017-01-31 NOTE — Telephone Encounter (Signed)
Call pt and let him know I did send medication for prostate for him to start once he is over the flu.  He can f/u with me in about 2 months for the medication.

## 2017-02-01 ENCOUNTER — Other Ambulatory Visit: Payer: Self-pay | Admitting: Physician Assistant

## 2017-02-01 ENCOUNTER — Encounter: Payer: Self-pay | Admitting: Physician Assistant

## 2017-02-01 ENCOUNTER — Ambulatory Visit (INDEPENDENT_AMBULATORY_CARE_PROVIDER_SITE_OTHER): Payer: Medicare HMO

## 2017-02-01 ENCOUNTER — Ambulatory Visit (INDEPENDENT_AMBULATORY_CARE_PROVIDER_SITE_OTHER): Payer: Medicare HMO | Admitting: Physician Assistant

## 2017-02-01 VITALS — BP 142/70 | HR 73 | Temp 98.9°F | Ht 69.25 in | Wt 195.4 lb

## 2017-02-01 DIAGNOSIS — J181 Lobar pneumonia, unspecified organism: Secondary | ICD-10-CM | POA: Diagnosis not present

## 2017-02-01 DIAGNOSIS — J189 Pneumonia, unspecified organism: Secondary | ICD-10-CM

## 2017-02-01 DIAGNOSIS — R918 Other nonspecific abnormal finding of lung field: Secondary | ICD-10-CM | POA: Diagnosis not present

## 2017-02-01 MED ORDER — BENZONATATE 200 MG PO CAPS: 200.0000 mg | ORAL_CAPSULE | Freq: Two times a day (BID) | ORAL | 0 refills | Status: DC | PRN

## 2017-02-01 MED ORDER — HYDROCOD POLST-CPM POLST ER 10-8 MG/5ML PO SUER: 5.0000 mL | Freq: Two times a day (BID) | ORAL | 0 refills | Status: DC | PRN

## 2017-02-01 MED ORDER — LEVOFLOXACIN 500 MG PO TABS: 500.0000 mg | ORAL_TABLET | Freq: Every day | ORAL | 0 refills | Status: DC

## 2017-02-01 MED ORDER — ALBUTEROL SULFATE HFA 108 (90 BASE) MCG/ACT IN AERS: 2.0000 | INHALATION_SPRAY | RESPIRATORY_TRACT | 0 refills | Status: DC | PRN

## 2017-02-01 NOTE — Telephone Encounter (Signed)
Spoke with patient: told him Dr.Metheney sent medication for prostate for him to start once he is over the flu.  He can f/u with her in about 2 months for the medication. Patient understands and will comply.

## 2017-02-01 NOTE — Progress Notes (Signed)
Subjective:    Patient ID: [name], male    DOB: August 18, 1941, 76 y.o.   MRN: [id number]  HPI  [name] returns for follow-up of his influenza and community acquired PNA. He was seen in our clinic on 01/29/17 and was diagnosed with Influenza B and PNA. He was already prescribed Tamiflu the day prior to being seen, and he was started on Levaquin after CXR review. His wife took him to the ED about 3 hours after being seen in our clinic because his temperature was the highest it had been throughout his entire illness and his wife said that the fever wasn't reducing with medication. She took him to [facility] Emergency Department. In triage at the ED he had some arrhythmia where his heart rate fluctuated from 60 - 120, with occasional ectopic beats. Per cardiology note by Cathlean Sauer on 06/2016 he has a history of brief atrial fibrillation, he was started on anticoagulation earlier in 2017 but never ended up taking it because of the potential side effects.  He has continued to take Levaquin and Tamiflu. Denies chest pain and SOB presently. Has not had any additional fevers since ED visit, per wife. His cough has improved overall but there are times where he has continued productive cough.  Review of Systems  See HPI  Past Medical History:  Diagnosis Date  . Abdominal wall defect, acquired   . Blockage of coronary artery of heart (HCC)    30%  . Hearing aid worn   . Insulin resistance   . LV dysfunction    reduction  . Osteoarthritis      Social History   Social History  . Marital status: Married    Spouse name: [name]  . Number of children: [name]  . Years of education: [name]   Occupational History  . 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 Smoking status: Former Smoker    Quit date: 12/21/1985  . Smokeless tobacco: Never Used  . Alcohol use No  . Drug use: No  . Sexual activity: Not on file     Comment: self employed, makes cakes, married, 4  adult children,    Other Topics Concern  . Not on file   Social History Narrative   Born in [place].     Past Surgical History:  Procedure Laterality Date  . ACNE CYST REMOVAL  2012   Back   . APPENDECTOMY    . CARDIAC CATHETERIZATION    . COLECTOMY  11/2013   [facility], 2nd to diverticulitis  . RT hip replacement    . TONSILLECTOMY      Family History  Problem Relation Age of Onset  . Heart disease Mother     pacemaker    Allergies  Allergen Reactions  . Flagyl [Metronidazole] Nausea And Vomiting  . Lipitor [Atorvastatin] Other (See Comments)    Myalgia    Current Outpatient Prescriptions on File Prior to Visit  Medication Sig Dispense Refill  . aspirin EC 81 MG tablet Take 81 mg by mouth.    . erythromycin [facility]) ophthalmic ointment Apply thin ribbon to affected eye(s) once daily for 7 days. 1 g 0  . levofloxacin (LEVAQUIN) 500 MG tablet Take 1 tablet (500 mg total) by mouth daily. 5 tablet 0  . levothyroxine (SYNTHROID, LEVOTHROID) 75 MCG tablet Take 1 tablet (75 mcg total) by mouth daily before breakfast. NEED FOLLOW UP APPOINTMENT FOR MORE REFILLS 30 tablet  0  . meclizine (ANTIVERT) 25 MG tablet Take 1 tablet (25 mg total) by mouth 3 (three) times daily as needed for dizziness. 30 tablet 0  . meloxicam (MOBIC) 15 MG tablet TAKE ONE TABLET BY MOUTH ONCE DAILY 90 tablet 1  . metoprolol (LOPRESSOR) 50 MG tablet Take 50 mg by mouth.    . Multiple Vitamin (MULTIVITAMIN) tablet Take 1 tablet by mouth daily.      [name] omeprazole (PRILOSEC) 20 MG capsule Take 20 mg by mouth daily.      [name] oseltamivir (TAMIFLU) 75 MG capsule Take 1 capsule (75 mg total) by mouth 2 (two) times daily. X 5 days. 10 capsule 0  . simvastatin (ZOCOR) 40 MG tablet Take 1 tablet (40 mg total) by mouth daily at 6 PM. 90 tablet 1  . tamsulosin (FLOMAX) 0.4 MG CAPS capsule Take 1 capsule (0.4 mg total) by mouth daily. 30 capsule 3   No current facility-administered medications on file prior to visit.       BP (!) 142/70 (BP Location: Left Arm, Patient Position: Sitting, Cuff Size: Normal)   Pulse 73   Temp 98.9 F (37.2 C) (Oral)   Ht 5' 9.25" (1.759 m)   Wt 195 lb 6.1 oz (88.6 kg)   SpO2 95%   BMI 28.64 kg/m       Objective:   Physical Exam  Constitutional: He appears well-developed and well-nourished. He is cooperative.  Non-toxic appearance. He does not have a sickly appearance. He does not appear ill. No distress.  HENT:  Head: Normocephalic and atraumatic.  Nose: Nose normal. Right sinus exhibits no maxillary sinus tenderness and no frontal sinus tenderness. Left sinus exhibits no maxillary sinus tenderness and no frontal sinus tenderness.  Mouth/Throat: Uvula is midline. Posterior oropharyngeal edema present. No oropharyngeal exudate or posterior oropharyngeal erythema.  Cardiovascular: Normal rate, regular rhythm and normal heart sounds.   Pulmonary/Chest: Effort normal. No accessory muscle usage. No respiratory distress. He has no decreased breath sounds. He has no wheezes. He has rhonchi.  Ronchi throughout, productive cough throughout lung exam  Lymphadenopathy:    He has no cervical adenopathy.  Neurological: He is alert.  Skin: Skin is warm, dry and intact.  Nursing note and vitals reviewed.  CXR: with persistent lingular opacity and left pleural effusion     Assessment & Plan:  1. Community acquired pneumonia of left lower lobe of lung ([place]) Stable. Will continue Levaquin for a total of 500mg  x 7 days. Advised patient to ER if he develops any worsening symptoms. I also recommended that he follow-up with cardiologist, Dr. Mauricio Po this week regarding his ED visit findings. I also prescribed Tussionex for his cough, advised him to stop taking the Phenergan with Codeine if he is going to take the Tussionex. Patient and wife agreeable to plan.   [name] PA-C 02/01/17

## 2017-02-01 NOTE — Patient Instructions (Addendum)
Please make a follow-up appointment with Dr. Lamar Blinks, I would like for you to be seen this week if possible.  Stop by the lab for a repeat chest xray.  Make a follow-up appointment with me in 1 week, sooner if needed. Please go to the ER if you develop any worsening symptoms.

## 2017-02-01 NOTE — Progress Notes (Signed)
Pre visit review using our clinic review tool, if applicable. No additional management support is needed unless otherwise documented below in the visit note. 

## 2017-02-02 DIAGNOSIS — I480 Paroxysmal atrial fibrillation: Secondary | ICD-10-CM | POA: Diagnosis not present

## 2017-02-02 DIAGNOSIS — I493 Ventricular premature depolarization: Secondary | ICD-10-CM | POA: Diagnosis not present

## 2017-02-02 DIAGNOSIS — I340 Nonrheumatic mitral (valve) insufficiency: Secondary | ICD-10-CM | POA: Diagnosis not present

## 2017-02-02 DIAGNOSIS — I2510 Atherosclerotic heart disease of native coronary artery without angina pectoris: Secondary | ICD-10-CM | POA: Diagnosis not present

## 2017-02-02 DIAGNOSIS — I428 Other cardiomyopathies: Secondary | ICD-10-CM | POA: Diagnosis not present

## 2017-02-08 ENCOUNTER — Ambulatory Visit (INDEPENDENT_AMBULATORY_CARE_PROVIDER_SITE_OTHER): Payer: Medicare HMO | Admitting: Physician Assistant

## 2017-02-08 ENCOUNTER — Encounter: Payer: Self-pay | Admitting: Physician Assistant

## 2017-02-08 VITALS — BP 118/68 | HR 68 | Temp 98.0°F | Ht 69.25 in | Wt 192.2 lb

## 2017-02-08 DIAGNOSIS — J181 Lobar pneumonia, unspecified organism: Secondary | ICD-10-CM

## 2017-02-08 DIAGNOSIS — J189 Pneumonia, unspecified organism: Secondary | ICD-10-CM

## 2017-02-08 MED ORDER — HYDROCOD POLST-CPM POLST ER 10-8 MG/5ML PO SUER: 5.0000 mL | Freq: Two times a day (BID) | ORAL | 0 refills | Status: DC | PRN

## 2017-02-08 NOTE — Progress Notes (Signed)
Pre visit review using our clinic review tool, if applicable. No additional management support is needed unless otherwise documented below in the visit note. 

## 2017-02-08 NOTE — Progress Notes (Signed)
Subjective:    Patient ID: BON REASON, male    DOB: 04-15-1941, 76 y.o.   MRN: [id number]  HPI  [name] is a 76 y/o male who presents today for a 1-week follow-up of his community acquired PNA. He has not had any fevers or SOB since he last saw me. His cough is significantly improved. He was seen by his cardiologist on 02/02/2017. He was started on low-dose lasix, as the cardiologist was worried about the possibility of the patient having developed some heart failure. He has an echo scheduled for March 16. Started 40 mg lasix and is taking it as scheduled. He was given a prescription for 30 days. Wife plans to call office and ask about what she should do once the prescription has run out. He has been weighing himself daily, his weight is overall decreased by about 4-5 lb since he started the lasix. Last night was the first time he slept well. He is almost out of Tussionex. He states that this medication worked very well, albeit expensive. Finished Levaquin on 02/04/17. He is not using the albuterol. Denies chest pain, heart palpitations, lower leg swelling.  Review of Systems  See HPI  Past Medical History:  Diagnosis Date  . Abdominal wall defect, acquired   . Blockage of coronary artery of heart (HCC)    30%  . Hearing aid worn   . Insulin resistance   . LV dysfunction    reduction  . Osteoarthritis      Social History   Social History  . Marital status: Married    Spouse name: [name]  . Number of children: [name]  . Years of education: [name]   Occupational History  . 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 Smoking status: Former Smoker    Quit date: 12/21/1985  . Smokeless tobacco: Never Used  . Alcohol use No  . Drug use: No  . 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 Not on file   Social History Narrative   Born in [place].     Past  Surgical History:  Procedure Laterality Date  . ACNE CYST REMOVAL  2012   Back   . APPENDECTOMY    . CARDIAC CATHETERIZATION    . COLECTOMY  11/2013   [facility], 2nd to diverticulitis  . RT hip replacement    . TONSILLECTOMY      Family History  Problem Relation Age of Onset  . Heart disease Mother     pacemaker    Allergies  Allergen Reactions  . Flagyl [Metronidazole] Nausea And Vomiting  . Lipitor [Atorvastatin] Other (See Comments)    Myalgia    Current Outpatient Prescriptions on File Prior to Visit  Medication Sig Dispense Refill  . aspirin EC 81 MG tablet Take 81 mg by mouth.    . levothyroxine (SYNTHROID, LEVOTHROID) 75 MCG tablet Take 1 tablet (75 mcg total) by mouth daily before breakfast. NEED FOLLOW UP APPOINTMENT FOR MORE REFILLS 30 tablet 0  . meloxicam (MOBIC) 15 MG tablet TAKE ONE TABLET BY MOUTH ONCE DAILY 90 tablet 1  . metoprolol (LOPRESSOR) 50 MG tablet Take 50 mg by mouth.    . Multiple Vitamin (MULTIVITAMIN) tablet Take 1 tablet by mouth daily.      [name] omeprazole (PRILOSEC) 20 MG capsule Take 20 mg by  mouth daily.      . simvastatin (ZOCOR) 40 MG tablet Take 1 tablet (40 mg total) by mouth daily at 6 PM. 90 tablet 1  . albuterol (PROVENTIL HFA;VENTOLIN HFA) 108 (90 Base) MCG/ACT inhaler Inhale 2 puffs into the lungs every 4 (four) hours as needed for wheezing or shortness of breath. (Patient not taking: Reported on 02/08/2017) 1 Inhaler 0  . meclizine (ANTIVERT) 25 MG tablet Take 1 tablet (25 mg total) by mouth 3 (three) times daily as needed for dizziness. (Patient not taking: Reported on 02/08/2017) 30 tablet 0  . tamsulosin (FLOMAX) 0.4 MG CAPS capsule Take 1 capsule (0.4 mg total) by mouth daily. (Patient not taking: Reported on 02/08/2017) 30 capsule 3   No current facility-administered medications on file prior to visit.     BP 118/68 (BP Location: Left Arm, Patient Position: Sitting, Cuff Size: Normal)   Pulse 68   Temp 98 F (36.7 C) (Oral)   Ht 5'  9.25" (1.759 m)   Wt 192 lb 4 oz (87.2 kg)   SpO2 97%   BMI 28.19 kg/m      Objective:   Physical Exam  Constitutional: Vital signs are normal. He appears well-developed and well-nourished. He is cooperative.  Non-toxic appearance. He does not have a sickly appearance. He does not appear ill. No distress.  HENT:  Head: Normocephalic and atraumatic.  Mouth/Throat: Uvula is midline and oropharynx is clear and moist. No oropharyngeal exudate, posterior oropharyngeal edema, posterior oropharyngeal erythema or tonsillar abscesses.  Cardiovascular: Normal rate, regular rhythm and normal heart sounds.   Pulmonary/Chest: Effort normal. He has no decreased breath sounds. He has no wheezes. He has rhonchi (occasional). He has no rales.  Lymphadenopathy:    He has no cervical adenopathy.  Neurological: He is alert.  Skin: Skin is warm, dry and intact.  Nursing note and vitals reviewed.     Assessment & Plan:  1. Community acquired pneumonia of left lower lobe of lung ([place])  Vitals stable. Patient is clinically doing much better than when I last saw him. His wife remains concerned, provided appropriate counseling and reassurance. Because patient is clinically doing so well today, I do not think that a repeat chest xray today is warranted as we are only 1 week out from his dx, and will wait until approximately 4 weeks s/p initial diagnosis to complete the chest xray. Patient and wife agreeable. If any changes before follow-up visit, patient and wife should seek medical attention. I refilled Tussionex in case needed.  [name] PA-C 02/08/17   - DG Chest 2 View; Future

## 2017-02-08 NOTE — Patient Instructions (Signed)
It was great seeing you today!  Please follow-up with [place] for a repeat chest xray around March 9th. We can schedule an appointment for you to discuss some of your other health concerns at that time.  Please let us know if you have any worsening symptoms, including shortness of breath, chest discomfort, fevers or cough.

## 2017-02-16 ENCOUNTER — Ambulatory Visit: Payer: Medicare HMO

## 2017-02-16 DIAGNOSIS — M9902 Segmental and somatic dysfunction of thoracic region: Secondary | ICD-10-CM | POA: Diagnosis not present

## 2017-02-16 DIAGNOSIS — M9901 Segmental and somatic dysfunction of cervical region: Secondary | ICD-10-CM | POA: Diagnosis not present

## 2017-02-16 DIAGNOSIS — M9903 Segmental and somatic dysfunction of lumbar region: Secondary | ICD-10-CM | POA: Diagnosis not present

## 2017-02-16 DIAGNOSIS — M542 Cervicalgia: Secondary | ICD-10-CM | POA: Diagnosis not present

## 2017-02-26 NOTE — Progress Notes (Deleted)
Subjective:   [name] is a 76 y.o. male who presents for an Initial Medicare Annual Wellness Visit.  Review of Systems  No ROS.  Medicare Wellness Visit.   Sleep patterns:  Home Safety/Smoke Alarms:   Living environment; residence and Firearm Safety: Seat Belt Safety/Bike Helmet: Wears seat belt.   Counseling:   Eye Exam-  Dental-  Male:   CCS-    10/17/2013. Normal. PSA-  Lab Results  Component Value Date   PSA 0.85 07/08/2016   PSA 0.53 08/28/2011   PSA 0.58 07/23/2010       Objective:    There were no vitals filed for this visit. There is no height or weight on file to calculate BMI.  Current Medications (verified) Outpatient Encounter Prescriptions as of 03/01/2017  Medication Sig  . aspirin EC 81 MG tablet Take 81 mg by mouth.  . chlorpheniramine-HYDROcodone (TUSSIONEX PENNKINETIC ER) 10-8 MG/5ML SUER Take 5 mLs by mouth every 12 (twelve) hours as needed for cough.  . furosemide (LASIX) 40 MG tablet Take by mouth.  . levothyroxine (SYNTHROID, LEVOTHROID) 75 MCG tablet Take 1 tablet (75 mcg total) by mouth daily before breakfast. NEED FOLLOW UP APPOINTMENT FOR MORE REFILLS  . meloxicam (MOBIC) 15 MG tablet TAKE ONE TABLET BY MOUTH ONCE DAILY  . metoprolol (LOPRESSOR) 50 MG tablet Take 50 mg by mouth.  . Multiple Vitamin (MULTIVITAMIN) tablet Take 1 tablet by mouth daily.    [name] omeprazole (PRILOSEC) 20 MG capsule Take 20 mg by mouth daily.    . simvastatin (ZOCOR) 40 MG tablet Take 1 tablet (40 mg total) by mouth daily at 6 PM.   No facility-administered encounter medications on file as of 03/01/2017.     Allergies (verified) Flagyl [metronidazole] and Lipitor [atorvastatin]   History: Past Medical History:  Diagnosis Date  . Abdominal wall defect, acquired   . Blockage of coronary artery of heart (HCC)    30%  . Hearing aid worn   . Insulin resistance   . LV dysfunction    reduction  . Osteoarthritis    Past Surgical History:  Procedure  Laterality Date  . ACNE CYST REMOVAL  2012   Back   . APPENDECTOMY    . CARDIAC CATHETERIZATION    . COLECTOMY  11/2013   [facility], 2nd to diverticulitis  . RT hip replacement    . TONSILLECTOMY     Family History  Problem Relation Age of Onset  . Heart disease Mother     pacemaker   Social History   Occupational History  . 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 Smoking status: Former Smoker    Quit date: 12/21/1985  . Smokeless tobacco: Never Used  . Alcohol use No  . Drug use: No  . 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  Immunizations and Health Maintenance Immunization History  Administered Date(s) Administered  . Influenza Split 12/07/2011, 11/24/2012  . Influenza Whole 09/17/2008, 09/20/2009  . Influenza, High Dose Seasonal PF 01/20/2017  . Influenza,inj,Quad PF,36+ Mos 12/24/2014, 09/10/2015  . Pneumococcal Conjugate-13 12/24/2014  . Pneumococcal Polysaccharide-23 10/24/2009  . Td 10/24/2009  . Zoster 04/20/2012   There are no preventive care reminders to display for this patient.  Patient Care Team: [name],  PA as PCP - General (Physician Assistant) Ellis Parents, MD as Attending Physician (Cardiology)  Indicate any recent Medical Services you may have received from other than [facility] providers in the past year (date may be approximate).    Assessment:   This is a routine wellness examination for [name]. Physical assessment deferred to PCP.  Hearing/Vision screen No exam data present  Dietary issues and exercise activities discussed:   Diet (meal preparation, eat out, water intake, caffeinated beverages, dairy products, fruits and vegetables): {Desc; diets:16563} Breakfast: Lunch:  Dinner:      Goals    None     Depression  Screen PHQ 2/9 Scores 07/09/2016 07/09/2015  PHQ - 2 Score 0 0    Fall Risk Fall Risk  07/09/2016 07/09/2015  Falls in the past year? No No    Cognitive Function:        Screening Tests Health Maintenance  Topic Date Due  . TETANUS/TDAP  10/25/2019  . COLONOSCOPY  10/18/2023  . INFLUENZA VACCINE  Addressed  . PNA vac Low Risk Adult  Completed        Plan:   ***  During the course of the visit Cap[place] was educated and counseled about the following appropriate screening and preventive services:   Vaccines to include Pneumoccal, Influenza, Hepatitis B, Td, Zostavax, HCV  Colorectal cancer screening  Cardiovascular disease screening   Diabetes screening  Glaucoma screening  Nutrition counseling  Prostate cancer screening  Smoking cessation counseling  Patient Instructions (the written plan) were given to the patient.   Ree Edman, RN   02/26/2017

## 2017-02-26 NOTE — Progress Notes (Deleted)
Pre visit review using our clinic review tool, if applicable. No additional management support is needed unless otherwise documented below in the visit note. 

## 2017-03-01 ENCOUNTER — Ambulatory Visit (INDEPENDENT_AMBULATORY_CARE_PROVIDER_SITE_OTHER): Payer: Medicare HMO | Admitting: Physician Assistant

## 2017-03-01 ENCOUNTER — Encounter: Payer: Self-pay | Admitting: Physician Assistant

## 2017-03-01 ENCOUNTER — Ambulatory Visit (INDEPENDENT_AMBULATORY_CARE_PROVIDER_SITE_OTHER): Payer: Medicare HMO

## 2017-03-01 VITALS — BP 150/80 | HR 93 | Temp 97.9°F | Ht 69.25 in | Wt 194.4 lb

## 2017-03-01 DIAGNOSIS — I480 Paroxysmal atrial fibrillation: Secondary | ICD-10-CM | POA: Diagnosis not present

## 2017-03-01 DIAGNOSIS — E7800 Pure hypercholesterolemia, unspecified: Secondary | ICD-10-CM

## 2017-03-01 DIAGNOSIS — J189 Pneumonia, unspecified organism: Secondary | ICD-10-CM | POA: Diagnosis not present

## 2017-03-01 DIAGNOSIS — R938 Abnormal findings on diagnostic imaging of other specified body structures: Secondary | ICD-10-CM

## 2017-03-01 DIAGNOSIS — M159 Polyosteoarthritis, unspecified: Secondary | ICD-10-CM

## 2017-03-01 DIAGNOSIS — I2510 Atherosclerotic heart disease of native coronary artery without angina pectoris: Secondary | ICD-10-CM | POA: Diagnosis not present

## 2017-03-01 DIAGNOSIS — E038 Other specified hypothyroidism: Secondary | ICD-10-CM

## 2017-03-01 DIAGNOSIS — J181 Lobar pneumonia, unspecified organism: Secondary | ICD-10-CM

## 2017-03-01 DIAGNOSIS — Z0001 Encounter for general adult medical examination with abnormal findings: Secondary | ICD-10-CM | POA: Diagnosis not present

## 2017-03-01 DIAGNOSIS — E876 Hypokalemia: Secondary | ICD-10-CM | POA: Diagnosis not present

## 2017-03-01 DIAGNOSIS — R42 Dizziness and giddiness: Secondary | ICD-10-CM

## 2017-03-01 LAB — COMPREHENSIVE METABOLIC PANEL
ALBUMIN: 4.4 g/dL (ref 3.5–5.2)
ALK PHOS: 42 U/L (ref 39–117)
ALT: 13 U/L (ref 0–53)
AST: 20 U/L (ref 0–37)
BUN: 15 mg/dL (ref 6–23)
CO2: 28 mEq/L (ref 19–32)
CREATININE: 0.94 mg/dL (ref 0.40–1.50)
Calcium: 9.3 mg/dL (ref 8.4–10.5)
Chloride: 107 mEq/L (ref 96–112)
GFR: 83.17 mL/min (ref >60.00)
GLUCOSE: 108 mg/dL — AB (ref 70–99)
Potassium: 4.5 mEq/L (ref 3.5–5.1)
Sodium: 140 mEq/L (ref 135–145)
TOTAL PROTEIN: 6.8 g/dL (ref 6.0–8.3)
Total Bilirubin: 0.7 mg/dL (ref 0.2–1.2)

## 2017-03-01 LAB — CBC WITH DIFFERENTIAL/PLATELET
BASOS ABS: 0 10*3/uL (ref 0.0–0.1)
Basophils Relative: 0.6 % (ref 0.0–3.0)
EOS ABS: 0.3 10*3/uL (ref 0.0–0.7)
Eosinophils Relative: 5.2 % — ABNORMAL HIGH (ref 0.0–5.0)
HCT: 40.3 % (ref 39.0–52.0)
HEMOGLOBIN: 13.7 g/dL (ref 13.0–17.0)
Lymphocytes Relative: 22.2 % (ref 12.0–46.0)
Lymphs Abs: 1.4 10*3/uL (ref 0.7–4.0)
MCHC: 34.1 g/dL (ref 30.0–36.0)
MCV: 87 fl (ref 78.0–100.0)
Monocytes Absolute: 0.6 10*3/uL (ref 0.1–1.0)
Monocytes Relative: 9 % (ref 3.0–12.0)
Neutro Abs: 4.1 10*3/uL (ref 1.4–7.7)
Neutrophils Relative %: 63 % (ref 43.0–77.0)
Platelets: 170 10*3/uL (ref 150.0–400.0)
RBC: 4.63 Mil/uL (ref 4.22–5.81)
RDW: 14.2 % (ref 11.5–15.5)
WBC: 6.5 10*3/uL (ref 4.0–10.5)

## 2017-03-01 LAB — LIPID PANEL
Cholesterol: 156 mg/dL (ref 0–200)
HDL: 42.7 mg/dL (ref >39.00)
LDL Cholesterol: 97 mg/dL (ref 0–99)
NonHDL: 112.8
TRIGLYCERIDES: 81 mg/dL (ref 0.0–149.0)
Total CHOL/HDL Ratio: 4
VLDL: 16.2 mg/dL (ref 0.0–40.0)

## 2017-03-01 NOTE — Progress Notes (Signed)
Pre visit review using our clinic review tool, if applicable. No additional management support is needed unless otherwise documented below in the visit note. 

## 2017-03-01 NOTE — Patient Instructions (Signed)
We will call you with your x-ray and lab results.  If your dizziness symptoms change in any way, or if you develop any stroke-like symptoms, please go to the emergency room. Continue Meclizine as needed for dizziness. We will put in a referral to ear/nose/throat MD for further evaluation and treatment.  Please make an appointment with Dr. [name] for further evaluation of hip pain and arthritis.    Benign Positional Vertigo Vertigo is the feeling that you or your surroundings are moving when they are not. Benign positional vertigo is the most common form of vertigo. The cause of this condition is not serious (is benign). This condition is triggered by certain movements and positions (is positional). This condition can be dangerous if it occurs while you are doing something that could endanger you or others, such as driving. What are the causes? In many cases, the cause of this condition is not known. It may be caused by a disturbance in an area of the inner ear that helps your brain to sense movement and balance. This disturbance can be caused by a viral infection (labyrinthitis), head injury, or repetitive motion. What increases the risk? This condition is more likely to develop in:  Women.  People who are 75 years of age or older. What are the signs or symptoms? Symptoms of this condition usually happen when you move your head or your eyes in different directions. Symptoms may start suddenly, and they usually last for less than a minute. Symptoms may include:  Loss of balance and falling.  Feeling like you are spinning or moving.  Feeling like your surroundings are spinning or moving.  Nausea and vomiting.  Blurred vision.  Dizziness.  Involuntary eye movement (nystagmus). Symptoms can be mild and cause only slight annoyance, or they can be severe and interfere with daily life. Episodes of benign positional vertigo may return (recur) over time, and they may be triggered by certain  movements. Symptoms may improve over time. How is this diagnosed? This condition is usually diagnosed by medical history and a physical exam of the head, neck, and ears. You may be referred to a health care provider who specializes in ear, nose, and throat (ENT) problems (otolaryngologist) or a provider who specializes in disorders of the nervous system (neurologist). You may have additional testing, including:  MRI.  A CT scan.  Eye movement tests. Your health care provider may ask you to change positions quickly while he or she watches you for symptoms of benign positional vertigo, such as nystagmus. Eye movement may be tested with an electronystagmogram (ENG), caloric stimulation, the Dix-Hallpike test, or the roll test.  An electroencephalogram (EEG). This records electrical activity in your brain.  Hearing tests. How is this treated? Usually, your health care provider will treat this by moving your head in specific positions to adjust your inner ear back to normal. Surgery may be needed in severe cases, but this is rare. In some cases, benign positional vertigo may resolve on its own in 2-4 weeks. Follow these instructions at home: Safety   Move slowly.Avoid sudden body or head movements.  Avoid driving.  Avoid operating heavy machinery.  Avoid doing any tasks that would be dangerous to you or others if a vertigo episode would occur.  If you have trouble walking or keeping your balance, try using a cane for stability. If you feel dizzy or unstable, sit down right away.  Return to your normal activities as told by your health care provider. Ask your  health care provider what activities are safe for you. General instructions   Take over-the-counter and prescription medicines only as told by your health care provider.  Avoid certain positions or movements as told by your health care provider.  Drink enough fluid to keep your urine clear or pale yellow.  Keep all follow-up  visits as told by your health care provider. This is important. Contact a health care provider if:  You have a fever.  Your condition gets worse or you develop new symptoms.  Your family or friends notice any behavioral changes.  Your nausea or vomiting gets worse.  You have numbness or a "pins and needles" sensation. Get help right away if:  You have difficulty speaking or moving.  You are always dizzy.  You faint.  You develop severe headaches.  You have weakness in your legs or arms.  You have changes in your hearing or vision.  You develop a stiff neck.  You develop sensitivity to light. This information is not intended to replace advice given to you by your health care provider. Make sure you discuss any questions you have with your health care provider. Document Released: 09/14/2006 Document Revised: 05/14/2016 Document Reviewed: 04/01/2015 Elsevier Interactive Patient Education  2017 [place] Maintenance, Male A healthy lifestyle and preventive care is important for your health and wellness. Ask your health care provider about what schedule of regular examinations is right for you. What should I know about weight and diet?  Eat a Healthy Diet  Eat plenty of vegetables, fruits, whole grains, low-fat dairy products, and lean protein.  Do not eat a lot of foods high in solid fats, added sugars, or salt. Maintain a Healthy Weight  Regular exercise can help you achieve or maintain a healthy weight. You should:  Do at least 150 minutes of exercise each week. The exercise should increase your heart rate and make you sweat (moderate-intensity exercise).  Do strength-training exercises at least twice a week. Watch Your Levels of Cholesterol and Blood Lipids  Have your blood tested for lipids and cholesterol every 5 years starting at 75 years of age. If you are at high risk for heart disease, you should start having your blood tested when you are 75 years  old. You may need to have your cholesterol levels checked more often if:  Your lipid or cholesterol levels are high.  You are older than 75 years of age.  You are at high risk for heart disease. What should I know about cancer screening? Many types of cancers can be detected early and may often be prevented. Lung Cancer  You should be screened every year for lung cancer if:  You are a current smoker who has smoked for at least 30 years.  You are a former smoker who has quit within the past 15 years.  Talk to your health care provider about your screening options, when you should start screening, and how often you should be screened. Colorectal Cancer  Routine colorectal cancer screening usually begins at 75 years of age and should be repeated every 5-10 years until you are 75 years old. You may need to be screened more often if early forms of precancerous polyps or small growths are found. Your health care provider may recommend screening at an earlier age if you have risk factors for colon cancer.  Your health care provider may recommend using home test kits to check for hidden blood in the stool.  A small camera at  the end of a tube can be used to examine your colon (sigmoidoscopy or colonoscopy). This checks for the earliest forms of colorectal cancer. Prostate and Testicular Cancer  Depending on your age and overall health, your health care provider may do certain tests to screen for prostate and testicular cancer.  Talk to your health care provider about any symptoms or concerns you have about testicular or prostate cancer. Skin Cancer  Check your skin from head to toe regularly.  Tell your health care provider about any new moles or changes in moles, especially if:  There is a change in a mole's size, shape, or color.  You have a mole that is larger than a pencil eraser.  Always use sunscreen. Apply sunscreen liberally and repeat throughout the day.  Protect yourself  by wearing long sleeves, pants, a wide-brimmed hat, and sunglasses when outside. What should I know about heart disease, diabetes, and high blood pressure?  If you are 66-38 years of age, have your blood pressure checked every 3-5 years. If you are 75 years of age or older, have your blood pressure checked every year. You should have your blood pressure measured twice-once when you are at a hospital or clinic, and once when you are not at a hospital or clinic. Record the average of the two measurements. To check your blood pressure when you are not at a hospital or clinic, you can use:  An automated blood pressure machine at a pharmacy.  A home blood pressure monitor.  Talk to your health care provider about your target blood pressure.  If you are between 31-53 years old, ask your health care provider if you should take aspirin to prevent heart disease.  Have regular diabetes screenings by checking your fasting blood sugar level.  If you are at a normal weight and have a low risk for diabetes, have this test once every three years after the age of 74.  If you are overweight and have a high risk for diabetes, consider being tested at a younger age or more often.  A one-time screening for abdominal aortic aneurysm (AAA) by ultrasound is recommended for men aged 70-75 years who are current or former smokers. What should I know about preventing infection? Hepatitis B  If you have a higher risk for hepatitis B, you should be screened for this virus. Talk with your health care provider to find out if you are at risk for hepatitis B infection. Hepatitis C  Blood testing is recommended for:  Everyone born from [birth year] through 1964.  Anyone with known risk factors for hepatitis C. Sexually Transmitted Diseases (STDs)  You should be screened each year for STDs including gonorrhea and chlamydia if:  You are sexually active and are younger than 75 years of age.  You are older than 75 years of age  and your health care provider tells you that you are at risk for this type of infection.  Your sexual activity has changed since you were last screened and you are at an increased risk for chlamydia or gonorrhea. Ask your health care provider if you are at risk.  Talk with your health care provider about whether you are at high risk of being infected with HIV. Your health care provider may recommend a prescription medicine to help prevent HIV infection. What else can I do?  Schedule regular health, dental, and eye exams.  Stay current with your vaccines (immunizations).  Do not use any tobacco products, such as cigarettes, chewing  tobacco, and e-cigarettes. If you need help quitting, ask your health care provider.  Limit alcohol intake to no more than 2 drinks per day. One drink equals 12 ounces of beer, 5 ounces of wine, or 1 ounces of hard liquor.  Do not use street drugs.  Do not share needles.  Ask your health care provider for help if you need support or information about quitting drugs.  Tell your health care provider if you often feel depressed.  Tell your health care provider if you have ever been abused or do not feel safe at home. This information is not intended to replace advice given to you by your health care provider. Make sure you discuss any questions you have with your health care provider. Document Released: 06/04/2008 Document Revised: 08/05/2016 Document Reviewed: 09/10/2015 Elsevier Interactive Patient Education  2017 [employer].

## 2017-03-01 NOTE — Progress Notes (Addendum)
Subjective:    Patient ID: [name], male    DOB: 02-08-1941, 76 y.o.   MRN: [id number]  HPI  [name] is a 76 y/o male who presents to clinic today for yearly wellness visit, follow-up PNA and chronic medical issues.  Acute Concerns: Vertigo -- he was dx with BPPV on 01/05/17 by prior PCP, he has had two episodes total in the past 6 weeks and states that meclizine is helping, after he takes for 2-3 days; usually happens when going with sitting to standing, feels as though he was well-hydrated, lasts only with movement, denies: ear pain, pressure, changes in hearing, LOC, does have  "feeling of congestion" in head; hx of severe L ear infection several years ago which resulted in hearing loss Follow-up on community acquired PNA -- Initially diagnosed with community-acquired pneumonia on 01/29/2017. He has completed a course of Levaquin. He was taking Tussionex as needed for cough. His symptoms have essentially completely resolved since that time. He presents today for follow-up chest x-ray as recommended per radiology 3-4 weeks after diagnosis. He does endorse occasional dry cough, but no fever, no SOB, no chest pain.  Chronic Issues: Arthritis - 15mg  meloxicam is no longer effective, prescribed 10+ years ago, bilateral hips with osteoarthritis, also in wrists and hands, hx of R hip replacement. He continues to make wedding cakes and the use of his hands constantly is causing ongoing pain. He has been seeing a chiropractor in the past for over 20 years. He says that they're not exactly sure where his pain is coming from.  LV dysfunction -- stopped lasix because he was down about 7lb after starting it, cardiology recommended holding medicine, has an echo planned with Dr. Mauricio Po on 03/05/17, denies shortness of breath, leg swelling Arrhythymia --followed by Dr. Mauricio Po. He has had conversations with cardiology about anticoagulation and has declined this numerous times per chart review. He is  currently maintained on low-dose aspirin. Also maintained on metoprolol for history of PVCs. Hyperlipidemia - denies any unusual muscle aches, maintained on Zocor 80 mg Hypothyroidism - stable on 75 g of levothyroxine, denies any new or unusual recent symptoms Hypokalemia - he has a history of this, denies any muscle cramps  Health Maintenance: Immunizations -- UTD Colonoscopy -- completed in 2014, with recommendation to complete again in 2024  Diet -- eats all types of foods Exercise -- 3x a week, 30-45 x a minute treadmill or bike -- has not done since dx with PNA Weight -- Weight: 194 lb 6.1 oz (88.2 kg)  Mood -- describes his mood as "upbeat"  Other providers/specialists: Chiropractor -- Dr. Fuller Plan ([place]), has seen for at least 4 years Cardiology -- Dr. Lamar Blinks Orthopedics -- Dr. Michaelle Birks for hx of R hip replacement GI - Dr. Ferdinand Lango for diverticulitis   Review of Systems  Constitutional: Negative for activity change, appetite change, fatigue and fever.  HENT: Negative for hearing loss, postnasal drip and sore throat.   Eyes: Negative for pain, redness and visual disturbance.  Respiratory: Positive for shortness of breath (with walking). Negative for cough and chest tightness.   Cardiovascular: Negative for chest pain, palpitations and leg swelling.  Gastrointestinal: Negative for constipation, diarrhea, nausea and vomiting.  Endocrine: Negative for polydipsia, polyphagia and polyuria.  Genitourinary: Negative for decreased urine volume, difficulty urinating, dysuria and frequency.  Musculoskeletal: Negative for joint swelling and myalgias.  Skin: Negative for rash.  Neurological: Negative for dizziness, syncope and numbness.  Hematological: Negative for adenopathy.  Psychiatric/Behavioral: Negative for dysphoric mood. The patient is not nervous/anxious.    Past Medical History:  Diagnosis Date  . Abdominal wall defect, acquired   . Blockage of coronary artery of heart  (HCC)    30%  . Hearing aid worn   . Insulin resistance   . LV dysfunction    reduction  . Osteoarthritis   . Pulmonary embolism [facility])      Social History   Social History  . Marital status: Married    Spouse name: [name]  . Number of children: [name]  . Years of education: [name]   Occupational History  . 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 Smoking status: Former Smoker    Quit date: 12/21/1985  . Smokeless tobacco: Never Used  . Alcohol use No  . Drug use: No  . 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 Not on file   Social History Narrative   Born in [place].     Past Surgical History:  Procedure Laterality Date  . ACNE CYST REMOVAL  2012   Back   . APPENDECTOMY    . CARDIAC CATHETERIZATION    . COLECTOMY  11/2013   [facility], 2nd to diverticulitis  . RT hip replacement    . TONSILLECTOMY      Family History  Problem Relation Age of Onset  . Heart disease Mother     pacemaker    Allergies  Allergen Reactions  . Flagyl [Metronidazole] Nausea And Vomiting  . Lipitor [Atorvastatin] Other (See Comments)    Myalgia    Current Outpatient Prescriptions on File Prior to Visit  Medication Sig Dispense Refill  . aspirin EC 81 MG tablet Take 81 mg by mouth.    . levothyroxine (SYNTHROID, LEVOTHROID) 75 MCG tablet Take 1 tablet (75 mcg total) by mouth daily before breakfast. NEED FOLLOW UP APPOINTMENT FOR MORE REFILLS 30 tablet 0  . meloxicam (MOBIC) 15 MG tablet TAKE ONE TABLET BY MOUTH ONCE DAILY 90 tablet 1  . metoprolol (LOPRESSOR) 50 MG tablet Take 50 mg by mouth.    . Multiple Vitamin (MULTIVITAMIN) tablet Take 1 tablet by mouth daily.      [name] omeprazole (PRILOSEC) 20 MG capsule Take 20 mg by mouth daily.      . simvastatin (ZOCOR) 40 MG tablet Take 1 tablet (40 mg total) by mouth daily at 6 PM. 90 tablet 1   No current  facility-administered medications on file prior to visit.     BP (!) 150/80 (BP Location: Left Arm, Cuff Size: Normal)   Pulse 93   Temp 97.9 F (36.6 C) (Oral)   Ht 5' 9.25" (1.759 m)   Wt 194 lb 6.1 oz (88.2 kg)   SpO2 96%   BMI 28.50 kg/m      Objective:   Physical Exam  Constitutional: He is oriented to person, place, and time. Vital signs are normal. He appears well-developed and well-nourished.  HENT:  Head: Normocephalic and atraumatic.  Right Ear: Tympanic membrane, external ear and ear canal normal. Tympanic membrane is not erythematous, not retracted and not bulging.  Left Ear: Tympanic membrane, external ear and ear canal normal. Tympanic membrane is not erythematous, not retracted and not bulging.  Nose: Nose normal.  Mouth/Throat: Uvula is midline. No posterior oropharyngeal edema or posterior oropharyngeal erythema.  Eyes:  Pupils are equal, round, and reactive to light.  Neck: Normal range of motion. Neck supple.  Cardiovascular: Normal rate, regular rhythm and normal heart sounds.   Pulmonary/Chest: Effort normal and breath sounds normal.  Abdominal: Soft. Normal appearance and bowel sounds are normal. He exhibits no distension and no mass. There is no tenderness. There is no rebound and no guarding.  Musculoskeletal: Normal range of motion.  Lymphadenopathy:    He has no cervical adenopathy.  Neurological: He is alert and oriented to person, place, and time. He has normal reflexes. No cranial nerve deficit. Coordination normal.  Skin: Skin is warm and dry.  Psychiatric: He has a normal mood and affect. His behavior is normal. Judgment and thought content normal.  Nursing note and vitals reviewed.  CXR PA and lateral:  IMPRESSION: 1. Persistent lingular opacity. This could represent a prominent epicardial fat pad, atelectasis or mass. Further evaluation with a chest CT with contrast is recommended. 2. Mild bronchitic changes.     Assessment & Plan:    Annual Visit for General Adult Medical Examination with Abnormal Findings Today patient counseled on age appropriate routine health concerns for screening and prevention, each reviewed and up to date or declined. Immunizations reviewed and up to date or declined. Labs ordered and reviewed. Risk factors for depression reviewed and negative. Hearing function and visual acuity are intact. ADLs screened and addressed as needed. Functional ability and level of safety reviewed and appropriate. Education, counseling and referrals performed based on assessed risks today. Patient provided with a copy of personalized plan for preventive services.  Problem List Items Addressed This Visit      Cardiovascular and Mediastinum   Paroxysmal atrial fibrillation ([place])    Followed by cardiology. No recent episodes. Patient declines anticoagulation other than low-dose aspirin.      Relevant Orders   Lipid panel   CBC with Differential/Platelet   CAD (coronary artery disease)    Patient for repeat echo on 03/05/17. Continue Zocor and 81 mg aspirin per cardiology. Patient has declined anticoagulation.      Relevant Orders   Lipid panel   CBC with Differential/Platelet     Respiratory   Community acquired pneumonia of left lower lobe of lung ([place])    He has completed treatment with Levaquin. Overall he is doing much better however chest x-ray today is recommending a chest CT with contrast secondary to persistent lingular opacity. I discussed the results with the patient's wife and she is agreeable to proceed.      Relevant Orders   DG Chest 2 View (Completed)   CT CHEST W CONTRAST     Endocrine   Hypothyroidism    Taking levothyroxine as prescribed. We will repeat labs today to assess thyroid function.      Relevant Orders   CBC with Differential/Platelet   T4, free   TSH     Musculoskeletal and Integument   Osteoarthritis    Uncontrolled. Meloxicam is no longer working. I recommended further  evaluation by sports medicine, Dr. [name], to see if any manipulation or other treatment modalities could be utilized prior to changing medications. Patient agreeable to plan.        Other   HYPERCHOLESTEROLEMIA    Repeat lipid panel today. Continue Zocor.      Relevant Orders   Lipid panel   CBC with Differential/Platelet   Hypokalemia    Repeat labs today to assess for evidence of hypokalemia.      Relevant Orders  Comprehensive metabolic panel   Vertigo - Primary    History consistent with BPPV. Meclizine is effective. Patient and wife would like to pursue second opinion for reassurance and have agreed to ENT evaluation. I have put in consult.          Patient to follow up with Ree Edman for AWV at earliest convenience.  [name] PA-C 03/02/17

## 2017-03-02 ENCOUNTER — Ambulatory Visit: Payer: Medicare HMO | Admitting: Sports Medicine

## 2017-03-02 ENCOUNTER — Telehealth: Payer: Self-pay | Admitting: *Deleted

## 2017-03-02 ENCOUNTER — Telehealth: Payer: Self-pay | Admitting: Physician Assistant

## 2017-03-02 ENCOUNTER — Other Ambulatory Visit: Payer: Self-pay | Admitting: Family Medicine

## 2017-03-02 DIAGNOSIS — R42 Dizziness and giddiness: Secondary | ICD-10-CM | POA: Insufficient documentation

## 2017-03-02 DIAGNOSIS — M1990 Unspecified osteoarthritis, unspecified site: Secondary | ICD-10-CM | POA: Insufficient documentation

## 2017-03-02 DIAGNOSIS — J189 Pneumonia, unspecified organism: Secondary | ICD-10-CM | POA: Insufficient documentation

## 2017-03-02 DIAGNOSIS — J181 Lobar pneumonia, unspecified organism: Secondary | ICD-10-CM

## 2017-03-02 LAB — T4, FREE: FREE T4: 0.87 ng/dL (ref 0.60–1.60)

## 2017-03-02 LAB — TSH: TSH: 2.42 u[IU]/mL (ref 0.35–4.50)

## 2017-03-02 MED ORDER — LEVOTHYROXINE SODIUM 75 MCG PO TABS: 75.0000 ug | ORAL_TABLET | Freq: Every day | ORAL | 5 refills | Status: DC

## 2017-03-02 NOTE — Assessment & Plan Note (Signed)
Repeat labs today to assess for evidence of hypokalemia.

## 2017-03-02 NOTE — Assessment & Plan Note (Signed)
Taking levothyroxine as prescribed. We will repeat labs today to assess thyroid function.

## 2017-03-02 NOTE — Assessment & Plan Note (Signed)
Uncontrolled. Meloxicam is no longer working. I recommended further evaluation by sports medicine, Dr. [name], to see if any manipulation or other treatment modalities could be utilized prior to changing medications. Patient agreeable to plan.

## 2017-03-02 NOTE — Telephone Encounter (Signed)
Called and left VM requesting return call to schedule AWV.

## 2017-03-02 NOTE — Assessment & Plan Note (Signed)
Followed by cardiology. No recent episodes. Patient declines anticoagulation other than low-dose aspirin.

## 2017-03-02 NOTE — Telephone Encounter (Signed)
Patient needs a refill on rx levothyroxine (SYNTHROID, LEVOTHROID) 75 MCG tablet. Please send to  [id number] - [place], [place] - [address] [phone] (Phone) [phone] (Fax)   Please call patient once order is placed.

## 2017-03-02 NOTE — Assessment & Plan Note (Signed)
Patient for repeat echo on 03/05/17. Continue Zocor and 81 mg aspirin per cardiology. Patient has declined anticoagulation.

## 2017-03-02 NOTE — Addendum Note (Signed)
Addended by: Marian Sorrow on: 03/02/2017 03:05 PM   Modules accepted: Orders

## 2017-03-02 NOTE — Assessment & Plan Note (Signed)
He has completed treatment with Levaquin. Overall he is doing much better however chest x-ray today is recommending a chest CT with contrast secondary to persistent lingular opacity. I discussed the results with the patient's wife and she is agreeable to proceed.

## 2017-03-02 NOTE — Telephone Encounter (Signed)
Pt's wife Letta Median notified Rx for Levothyroxine was sent to pharmacy as requested. Letta Median verbalized understanding.

## 2017-03-02 NOTE — Assessment & Plan Note (Signed)
History consistent with BPPV. Meclizine is effective. Patient and wife would like to pursue second opinion for reassurance and have agreed to ENT evaluation. I have put in consult.

## 2017-03-02 NOTE — Assessment & Plan Note (Signed)
Repeat lipid panel today. Continue Zocor.

## 2017-03-03 ENCOUNTER — Ambulatory Visit (HOSPITAL_BASED_OUTPATIENT_CLINIC_OR_DEPARTMENT_OTHER)
Admission: RE | Admit: 2017-03-03 | Discharge: 2017-03-03 | Disposition: A | Discharge status: Home / Self Care | Payer: Medicare HMO | Source: Ambulatory Visit | Attending: Physician Assistant | Admitting: Physician Assistant

## 2017-03-03 ENCOUNTER — Encounter (HOSPITAL_BASED_OUTPATIENT_CLINIC_OR_DEPARTMENT_OTHER): Payer: Self-pay

## 2017-03-03 DIAGNOSIS — I700 Atherosclerosis of aorta: Secondary | ICD-10-CM | POA: Diagnosis not present

## 2017-03-03 DIAGNOSIS — I2510 Atherosclerotic heart disease of native coronary artery without angina pectoris: Secondary | ICD-10-CM | POA: Diagnosis not present

## 2017-03-03 DIAGNOSIS — R918 Other nonspecific abnormal finding of lung field: Secondary | ICD-10-CM | POA: Diagnosis not present

## 2017-03-03 DIAGNOSIS — J181 Lobar pneumonia, unspecified organism: Secondary | ICD-10-CM | POA: Insufficient documentation

## 2017-03-03 DIAGNOSIS — J189 Pneumonia, unspecified organism: Secondary | ICD-10-CM

## 2017-03-03 MED ORDER — IOPAMIDOL (ISOVUE-300) INJECTION 61%
100.0000 mL | Freq: Once | INTRAVENOUS | Status: AC | PRN
  Administered 2017-03-03: 80 mL via INTRAVENOUS

## 2017-03-04 ENCOUNTER — Encounter: Payer: Self-pay | Admitting: Physician Assistant

## 2017-03-04 DIAGNOSIS — I700 Atherosclerosis of aorta: Secondary | ICD-10-CM | POA: Insufficient documentation

## 2017-03-05 ENCOUNTER — Encounter: Payer: Self-pay | Admitting: Family Medicine

## 2017-03-05 DIAGNOSIS — I5189 Other ill-defined heart diseases: Secondary | ICD-10-CM | POA: Diagnosis not present

## 2017-03-05 DIAGNOSIS — I517 Cardiomegaly: Secondary | ICD-10-CM | POA: Diagnosis not present

## 2017-03-05 DIAGNOSIS — I083 Combined rheumatic disorders of mitral, aortic and tricuspid valves: Secondary | ICD-10-CM | POA: Diagnosis not present

## 2017-03-05 DIAGNOSIS — I77810 Thoracic aortic ectasia: Secondary | ICD-10-CM | POA: Diagnosis not present

## 2017-03-08 DIAGNOSIS — Z0000 Encounter for general adult medical examination without abnormal findings: Secondary | ICD-10-CM | POA: Diagnosis not present

## 2017-03-08 DIAGNOSIS — K219 Gastro-esophageal reflux disease without esophagitis: Secondary | ICD-10-CM | POA: Diagnosis not present

## 2017-03-08 DIAGNOSIS — I429 Cardiomyopathy, unspecified: Secondary | ICD-10-CM | POA: Diagnosis not present

## 2017-03-08 DIAGNOSIS — E039 Hypothyroidism, unspecified: Secondary | ICD-10-CM | POA: Diagnosis not present

## 2017-03-08 DIAGNOSIS — H9113 Presbycusis, bilateral: Secondary | ICD-10-CM | POA: Diagnosis not present

## 2017-03-08 DIAGNOSIS — I493 Ventricular premature depolarization: Secondary | ICD-10-CM | POA: Diagnosis not present

## 2017-03-08 DIAGNOSIS — E7800 Pure hypercholesterolemia, unspecified: Secondary | ICD-10-CM | POA: Diagnosis not present

## 2017-03-08 DIAGNOSIS — Z6827 Body mass index (BMI) 27.0-27.9, adult: Secondary | ICD-10-CM | POA: Diagnosis not present

## 2017-03-08 DIAGNOSIS — M810 Age-related osteoporosis without current pathological fracture: Secondary | ICD-10-CM | POA: Diagnosis not present

## 2017-03-08 DIAGNOSIS — I10 Essential (primary) hypertension: Secondary | ICD-10-CM | POA: Diagnosis not present

## 2017-03-08 DIAGNOSIS — Z87891 Personal history of nicotine dependence: Secondary | ICD-10-CM | POA: Diagnosis not present

## 2017-03-15 ENCOUNTER — Ambulatory Visit (INDEPENDENT_AMBULATORY_CARE_PROVIDER_SITE_OTHER): Payer: Medicare HMO

## 2017-03-15 ENCOUNTER — Encounter: Payer: Self-pay | Admitting: Sports Medicine

## 2017-03-15 ENCOUNTER — Ambulatory Visit (INDEPENDENT_AMBULATORY_CARE_PROVIDER_SITE_OTHER): Payer: Medicare HMO | Admitting: Sports Medicine

## 2017-03-15 ENCOUNTER — Other Ambulatory Visit: Payer: Self-pay | Admitting: Physician Assistant

## 2017-03-15 VITALS — BP 126/62 | HR 92 | Temp 97.9°F | Resp 16 | Ht 69.0 in | Wt 193.3 lb

## 2017-03-15 DIAGNOSIS — M79642 Pain in left hand: Secondary | ICD-10-CM

## 2017-03-15 DIAGNOSIS — M542 Cervicalgia: Secondary | ICD-10-CM | POA: Diagnosis not present

## 2017-03-15 DIAGNOSIS — M47812 Spondylosis without myelopathy or radiculopathy, cervical region: Secondary | ICD-10-CM | POA: Diagnosis not present

## 2017-03-15 DIAGNOSIS — M19042 Primary osteoarthritis, left hand: Secondary | ICD-10-CM | POA: Diagnosis not present

## 2017-03-15 DIAGNOSIS — M79641 Pain in right hand: Secondary | ICD-10-CM

## 2017-03-15 DIAGNOSIS — R42 Dizziness and giddiness: Secondary | ICD-10-CM

## 2017-03-15 DIAGNOSIS — M19041 Primary osteoarthritis, right hand: Secondary | ICD-10-CM | POA: Diagnosis not present

## 2017-03-15 DIAGNOSIS — Z0000 Encounter for general adult medical examination without abnormal findings: Secondary | ICD-10-CM | POA: Diagnosis not present

## 2017-03-15 LAB — CBC
HCT: 39.7 % (ref 39.0–52.0)
Hemoglobin: 13.4 g/dL (ref 13.0–17.0)
MCHC: 33.9 g/dL (ref 30.0–36.0)
MCV: 86.7 fl (ref 78.0–100.0)
Platelets: 222 10*3/uL (ref 150.0–400.0)
RBC: 4.58 Mil/uL (ref 4.22–5.81)
RDW: 14.3 % (ref 11.5–15.5)
WBC: 6.6 10*3/uL (ref 4.0–10.5)

## 2017-03-15 LAB — SEDIMENTATION RATE: Sed Rate: 6 mm/hr (ref 0–20)

## 2017-03-15 LAB — COMPREHENSIVE METABOLIC PANEL
ALT: 13 U/L (ref 0–53)
AST: 18 U/L (ref 0–37)
Albumin: 4.5 g/dL (ref 3.5–5.2)
Alkaline Phosphatase: 37 U/L — ABNORMAL LOW (ref 39–117)
BILIRUBIN TOTAL: 0.7 mg/dL (ref 0.2–1.2)
BUN: 13 mg/dL (ref 6–23)
CHLORIDE: 108 meq/L (ref 96–112)
CO2: 26 mEq/L (ref 19–32)
Calcium: 9.3 mg/dL (ref 8.4–10.5)
Creatinine, Ser: 0.89 mg/dL (ref 0.40–1.50)
GFR: 88.57 mL/min (ref >60.00)
GLUCOSE: 100 mg/dL — AB (ref 70–99)
Potassium: 4.4 mEq/L (ref 3.5–5.1)
Sodium: 141 mEq/L (ref 135–145)
Total Protein: 6.8 g/dL (ref 6.0–8.3)

## 2017-03-15 LAB — C-REACTIVE PROTEIN: CRP: 0.2 mg/dL — ABNORMAL LOW (ref 0.5–20.0)

## 2017-03-15 LAB — URIC ACID: URIC ACID, SERUM: 6 mg/dL (ref 4.0–7.8)

## 2017-03-15 LAB — VITAMIN D 25 HYDROXY (VIT D DEFICIENCY, FRACTURES): VITD: 20.2 ng/mL — AB (ref 30.00–100.00)

## 2017-03-15 MED ORDER — CELECOXIB 100 MG PO CAPS: 100.0000 mg | ORAL_CAPSULE | Freq: Two times a day (BID) | ORAL | 2 refills | Status: DC | PRN

## 2017-03-15 NOTE — Progress Notes (Signed)
Pre visit review using our clinic review tool, if applicable. No additional management support is needed unless otherwise documented below in the visit note. 

## 2017-03-15 NOTE — Progress Notes (Signed)
OFFICE VISIT NOTE [name]. [name], [name] at [facility] [phone]  [name] - 76 y.o. male MRN [id number]  Date of birth: 1941-05-17  Visit Date: 03/15/2017  PCP: [name], PA   Referred by: [name], [place]  SUBJECTIVE:   Chief Complaint  Patient presents with  . Medicare Wellness  . Osteoarthiris    Both hands, shoulders, and neck are what bothers him. Shoulders are fairly a new onset. He is taking Meloxicam with some relief. No prevous xrays of hands.    HPI: As above. Additional pertinent information includes:  Patient reports symptoms in bilateral hands back and shoulder.  He has had known history of significant neck and lumbar problems.  He has had issues in multiple joints including his bilateral knees shoulders hips and elbows.  No prior evaluation for neuropathy that he is aware of.  No known history gout.  Symptoms his hands are describes a generalized stiffness that is worse first thing in the morning and tends to improve throughout the day.  Denies any fevers or chills recent weight gain or weight loss.  Recent hospitalization for pneumonia, he reports feeling overall better from this.   ROS: ROS  Otherwise per HPI.  HISTORY & PERTINENT PRIOR DATA:  No specialty comments available. He reports that he quit smoking about 31 years ago. He has never used smokeless tobacco.   Recent Labs  07/08/16 1148 03/15/17 1141  HGBA1C 5.5  --   LABURIC  --  6.0   Medications & Allergies reviewed per EMR Patient Active Problem List   Diagnosis Date Noted  . Cervical spondylosis without myelopathy 03/28/2017  . Pain in both hands 03/28/2017  . Thoracic aortic atherosclerosis ([place]) 03/04/2017  . Osteoarthritis 03/02/2017  . Community acquired pneumonia of left lower lobe of lung ([place]) 03/02/2017  . Vertigo 03/02/2017  . Cardiomyopathy ([place]) 04/30/2015  . Mitral regurgitation 04/30/2015  . CAD (coronary  artery disease) 05/21/2014  . Aortic sclerosis 04/09/2014  . History of pulmonary embolism 12/19/2013  . Diverticulitis of colon (without mention of hemorrhage)(562.11) 10/19/2013  . Diverticulitis of colon 08/26/2013  . Diastolic dysfunction 62/83/1517  . Pulmonary hypertension 07/20/2013  . Hypokalemia 03/28/2013  . Paroxysmal atrial fibrillation ([place]) 02/03/2013  . Abnormal stress echo 01/18/2013  . Low back pain 12/05/2012  . Degenerative arthritis of left hip 12/05/2012  . Hard of hearing 08/28/2011  . TACHYCARDIA 07/23/2010  . ABNORMAL FINDINGS, ELEVATED BP W/O HTN 03/14/2007  . HYPERCHOLESTEROLEMIA 12/20/2006  . Hypothyroidism 11/09/2006   Past Medical History:  Diagnosis Date  . Abdominal wall defect, acquired   . Blockage of coronary artery of heart (HCC)    30%  . Hearing aid worn   . Insulin resistance   . LV dysfunction    reduction  . Osteoarthritis   . Pulmonary embolism (HCC)    Family History  Problem Relation Age of Onset  . Heart disease Mother     pacemaker   Past Surgical History:  Procedure Laterality Date  . ACNE CYST REMOVAL  2012   Back   . APPENDECTOMY    . CARDIAC CATHETERIZATION    . COLECTOMY  11/2013   [facility], 2nd to diverticulitis  . RT hip replacement    . TONSILLECTOMY     Social History   Occupational History  . 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 Smoking status: Former  Smoker    Quit date: 12/21/1985  . Smokeless tobacco: Never Used  . Alcohol use No  . Drug use: No  . Sexual activity: Not on file     Comment: self employed, makes cakes, married, 4 adult children,     OBJECTIVE:  VS:  HT:5\' 9"  (175.3 cm)   WT:193 lb 4.8 oz (87.7 kg)  BMI:28.6    BP:126/62  HR:92bpm  TEMP:97.9 F (36.6 C)(Oral)  RESP:97 % Physical Exam  Constitutional: He appears well-developed and well-nourished. He is cooperative.  Non-toxic appearance.  HENT:  Head: Normocephalic and atraumatic.    Cardiovascular: Intact distal pulses.   Pulmonary/Chest: No accessory muscle usage. No respiratory distress.  Neurological: He is alert. He is not disoriented. He displays normal reflexes. No sensory deficit.  Skin: Skin is warm, dry and intact. Capillary refill takes less than 2 seconds. No abrasion and no rash noted.  Psychiatric: He has a normal mood and affect. His speech is normal and behavior is normal. Thought content normal.   Neck and upper extremities: Overall poor range motion of his neck.  Pain with brachial plexus squeeze and arm squeeze test bilaterally.  Negative Hoffman's.  Pain with Spurling's suppression test and Lhermitte's compression test bilaterally.  He has generalized osteoarthritic bossing of his hands and but this is minimal.  No pain with carpal tunnel compression test, Tinel's or Phalen's.  Grip strength slightly diminished bilaterally but intact.   IMAGING & PROCEDURES: Dg Chest 2 View  Result Date: 03/01/2017 CLINICAL DATA:  Recent pneumonia. Resolved cough and shortness of breath. Ex-smoker. EXAM: CHEST  2 VIEW COMPARISON:  02/01/2017. FINDINGS: The cardiac silhouette remains borderline enlarged. Aortic arch calcifications are again demonstrated. Lingular opacity initially seen on 01/29/2017 has not changed significantly. Mild central peribronchial thickening. Thoracic spine degenerative changes. IMPRESSION: 1. Persistent lingular opacity. This could represent a prominent epicardial fat pad, atelectasis or mass. Further evaluation with a chest CT with contrast is recommended. 2. Mild bronchitic changes. Electronically Signed   By: Claudie Revering M.D.   On: 03/01/2017 11:56   Dg Cervical Spine 2 Or 3 Views  Result Date: 03/15/2017 CLINICAL DATA:  Posterior neck pain for 3-4 months. No known injury. EXAM: CERVICAL SPINE - 2-3 VIEW COMPARISON:  None. FINDINGS: Large flowing anterior osteophytes throughout the cervical spine. Disc spaces are maintained. Prevertebral soft  tissues and alignment are normal. Severe diffuse facet disease bilaterally. No fracture. IMPRESSION: Large flowing anterior osteophytes. Diffuse degenerative facet disease. No acute findings. Electronically Signed   By: Rolm Baptise M.D.   On: 03/15/2017 11:50   Ct Chest W Contrast  Result Date: 03/03/2017 CLINICAL DATA:  Persistent lingular opacity EXAM: CT CHEST WITH CONTRAST TECHNIQUE: Multidetector CT imaging of the chest was performed during intravenous contrast administration. CONTRAST:  78mL ISOVUE-300 IOPAMIDOL (ISOVUE-300) INJECTION 61% COMPARISON:  Chest x-ray of 03/01/2017 and 02/08/2017 as well as 03/27/2013 FINDINGS: Cardiovascular: Moderate thoracic aortic atherosclerosis is present. The mid ascending thoracic aorta measures 38 mm in diameter. Coronary artery calcifications are present primarily in the left anterior descending and circumflex arteries. The heart is mildly enlarged. No pericardial effusion is seen. The pulmonary arteries opacified with no central abnormality noted. Mediastinum/Nodes: No mediastinal or hilar adenopathy is seen. Only small mediastinal lymph nodes are present. The thyroid gland is normal in size. No hiatal hernia is seen. Lungs/Pleura: On lung window images, no lung parenchymal abnormality is evident. Reviewing the prior chest x-rays, the opacity questioned probably represents epicardial fat in the lingula  and to a lesser degree in the right middle lobe. No suspicious mass is seen. There is probable scarring anteromedially within the right middle lobe. No pleural effusion is seen. The central airway is patent. Upper Abdomen: Within the upper abdomen the contours of the liver are somewhat nodular. Is there any clinical suspicion of cirrhosis? No focal hepatic abnormality is seen. No calcified gallstones are noted. The pancreas is unremarkable. Musculoskeletal: The thoracic vertebrae are in normal alignment with diffuse degenerative spurring present. IMPRESSION: 1. No  lingular lesion is seen by CT the chest. The area questioned on chest x-ray is appears to correspond to epicardial fat in the lingula and to a lesser degree within the right middle lobe. No suspicious mass or adenopathy is seen. 2. Minimal opacity anteromedially within the right middle lobe most likely represent scarring. 3. Moderate thoracic aortic atherosclerosis. 4. Coronary artery calcifications. 5. Somewhat nodular contours the liver. Question cirrhosis. Correlate clinically . Electronically Signed   By: Ivar Drape M.D.   On: 03/03/2017 14:16    Dg Hand 2 View Right  Result Date: 03/15/2017 CLINICAL DATA:  Hand pain, no known injury, initial encounter EXAM: RIGHT HAND - 2 VIEW COMPARISON:  None. FINDINGS: Mild degenerative changes are noted at the first [facility] joint similar to that seen on the left. No acute fracture or dislocation is noted. No gross soft tissue abnormality is seen. IMPRESSION: Mild degenerative change without acute abnormality. Electronically Signed   By: Inez Catalina M.D.   On: 03/15/2017 11:52   Dg Hand 2 View Left  Result Date: 03/15/2017 CLINICAL DATA:  Bilateral hand pain for several months, no known injury, initial encounter EXAM: LEFT HAND - 2 VIEW COMPARISON:  None. FINDINGS: No acute fracture or dislocation is noted. Degenerative changes at the first [facility] joint are noted. No gross soft tissue abnormality is noted. No acute fracture is seen. IMPRESSION: Mild degenerative change without acute abnormality. Electronically Signed   By: Inez Catalina M.D.   On: 03/15/2017 11:52   No additional findings.   ASSESSMENT & PLAN:  Visit Diagnoses:  1. Neck pain   2. Pain in both hands   3. Cervical spondylosis without myelopathy   4. Encounter for Medicare annual wellness exam    Meds:  Meds ordered this encounter  Medications  . celecoxib (CELEBREX) 100 MG capsule    Sig: Take 1 capsule (100 mg total) by mouth 2 (two) times daily as needed.    Dispense:  60 capsule     Refill:  2    Orders:  Orders Placed This Encounter  Procedures  . DG Cervical Spine 2 or 3 views  . DG Hand 2 View Left  . DG Hand 2 View Right  . MR Cervical Spine Wo Contrast  . CBC  . VITAMIN D 25 Hydroxy (Vit-D Deficiency, Fractures)  . Comprehensive metabolic panel  . Sedimentation rate  . C-reactive protein  . Uric acid  . Rheumatoid factor  . ANA  . Cyclic citrul peptide antibody, IgG    Follow-up: Return if symptoms worsen or fail to improve.   Otherwise please see problem oriented charting as below.

## 2017-03-15 NOTE — Progress Notes (Signed)
Subjective:   [name] is a 76 y.o. male who presents for an Initial Medicare Annual Wellness Visit.  Pt interested in ENT referral.    Review of Systems  No ROS.  Medicare Wellness Visit. Cardiac Risk Factors include: advanced age (>8men, >17 women);dyslipidemia;hypertension;male gender   Sleep patterns:  7 hrs sleep/night. States he feels rested. Home Safety/Smoke Alarms: Feels safe in home. Smoke alarms in place.    Living environment; residence and Firearm Safety: Lives with wife in two story home. Seat Belt Safety/Bike Helmet: Wears seat belt.   Counseling:   Eye Exam- Yearly eye exams Dental- Twice/year  Male:   CCS-  10/17/2013. Normal.   PSA-  Lab Results  Component Value Date   PSA 0.85 07/08/2016   PSA 0.53 08/28/2011   PSA 0.58 07/23/2010       Objective:    Today's Vitals   03/15/17 1006  BP: 126/62  Pulse: 92  Resp: 16  Temp: 97.9 F (36.6 C)  TempSrc: Oral  SpO2: 97%  Weight: 193 lb 4.8 oz (87.7 kg)  Height: 5\' 9"  (1.753 m)   Body mass index is 28.55 kg/m.  Current Medications (verified) Outpatient Encounter Prescriptions as of 03/15/2017  Medication Sig  . aspirin EC 81 MG tablet Take 81 mg by mouth.  . levothyroxine (SYNTHROID, LEVOTHROID) 75 MCG tablet Take 1 tablet (75 mcg total) by mouth daily before breakfast. NEED FOLLOW UP APPOINTMENT FOR MORE REFILLS  . meclizine (ANTIVERT) 25 MG tablet   . meloxicam (MOBIC) 15 MG tablet TAKE ONE TABLET BY MOUTH ONCE DAILY  . metoprolol (LOPRESSOR) 50 MG tablet Take 50 mg by mouth.  . Multiple Vitamin (MULTIVITAMIN) tablet Take 1 tablet by mouth daily.    [name] omeprazole (PRILOSEC) 20 MG capsule Take 20 mg by mouth daily.    . simvastatin (ZOCOR) 40 MG tablet Take 1 tablet (40 mg total) by mouth daily at 6 PM.  . celecoxib (CELEBREX) 100 MG capsule Take 1 capsule (100 mg total) by mouth 2 (two) times daily as needed.   No facility-administered encounter medications on file as of 03/15/2017.       Allergies (verified) Flagyl [metronidazole] and Lipitor [atorvastatin]   History: Past Medical History:  Diagnosis Date  . Abdominal wall defect, acquired   . Blockage of coronary artery of heart (HCC)    30%  . Hearing aid worn   . Insulin resistance   . LV dysfunction    reduction  . Osteoarthritis   . Pulmonary embolism [facility])    Past Surgical History:  Procedure Laterality Date  . ACNE CYST REMOVAL  2012   Back   . APPENDECTOMY    . CARDIAC CATHETERIZATION    . COLECTOMY  11/2013   [facility], 2nd to diverticulitis  . RT hip replacement    . TONSILLECTOMY     Family History  Problem Relation Age of Onset  . Heart disease Mother     pacemaker   Social History   Occupational History  . 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 Smoking status: Former Smoker    Quit date: 12/21/1985  . Smokeless tobacco: Never Used  . Alcohol use No  . Drug use: No  . Sexual activity: Not on file     Comment: self employed, makes cakes, married, 4 adult children,    Tobacco Counseling Counseling given: Not Answered   Activities of Daily Living In  your present state of health, do you have any difficulty performing the following activities: 03/15/2017 03/01/2017  Hearing? Tempie Donning  Vision? N N  Difficulty concentrating or making decisions? N N  Walking or climbing stairs? Y N  Dressing or bathing? N N  Doing errands, shopping? N N  Preparing Food and eating ? N -  Using the Toilet? N -  In the past six months, have you accidently leaked urine? N -  Do you have problems with loss of bowel control? N -  Managing your Medications? N -  Managing your Finances? N -  Housekeeping or managing your Housekeeping? N -  Some recent data might be hidden    Immunizations and Health Maintenance Immunization History  Administered Date(s) Administered  . Influenza Split 12/07/2011, 11/24/2012  . Influenza Whole 09/17/2008, 09/20/2009  . Influenza,  High Dose Seasonal PF 01/20/2017  . Influenza,inj,Quad PF,36+ Mos 12/24/2014, 09/10/2015  . Pneumococcal Conjugate-13 12/24/2014  . Pneumococcal Polysaccharide-23 10/24/2009  . Td 10/24/2009  . Zoster 04/20/2012   There are no preventive care reminders to display for this patient.  Patient Care Team: [name], [place] as PCP - General (Physician Assistant) Ellis Parents, MD as Attending Physician (Cardiology)  Indicate any recent Medical Services you may have received from other than [facility] providers in the past year (date may be approximate).    Assessment:   This is a routine wellness examination for [name]. Physical assessment deferred to PCP.  Hearing/Vision screen Hearing Screening Comments: Pt wears bilat hearing aids, Last visit was 09/2016. Goes yearly.  Vision Screening Comments: Pt wears glasses for distance. Eye exam yearly. Last was 09/2017.  Dietary issues and exercise activities discussed: Current Exercise Habits: Structured exercise class, Type of exercise: strength training/weights;treadmill, Time (Minutes): 60, Frequency (Times/Week): 3, Weekly Exercise (Minutes/Week): 180, Exercise limited by: orthopedic condition(s)   Diet (meal preparation, eat out, water intake, caffeinated beverages, dairy products, fruits and vegetables): States he eats some processed foods. Avoids GMO food. Eats 3 meals/day. Limits red meat intake. Frequently eats fish/chicken/turkey and vegetables.  Goals    . Less arthritic pain       Depression Screen PHQ 2/9 Scores 03/15/2017 03/01/2017 07/09/2016 07/09/2015  PHQ - 2 Score 0 0 0 0    Fall Risk Fall Risk  03/15/2017 03/01/2017 07/09/2016 07/09/2015  Falls in the past year? No No No No    Cognitive Function:   Ad8 score reviewed for issues:  Issues making decisions:no  Less interest in hobbies / activities:no  Repeats questions, stories (family complaining):no  Trouble using ordinary gadgets (microwave, computer,  phone):no  Forgets the month or year: no  Mismanaging finances: no  Remembering appts:no  Daily problems with thinking and/or memory:no Ad8 score is=0     Screening Tests Health Maintenance  Topic Date Due  . TETANUS/TDAP  10/25/2019  . COLONOSCOPY  10/18/2023  . INFLUENZA VACCINE  Addressed  . PNA vac Low Risk Adult  Completed        Plan:    Bring a copy of your advance directives to your next office visit. Follow up with PCP as directed.  During the course of the visit [name] was educated and counseled about the following appropriate screening and preventive services:   Vaccines to include Pneumoccal, Influenza, Hepatitis B, Td, Zostavax, HCV  Colorectal cancer screening  Cardiovascular disease screening  Diabetes screening  Glaucoma screening  Nutrition counseling  Prostate cancer screening  Patient Instructions (the written plan) were given to the  patient.   Ree Edman, RN   03/15/2017    I have personally reviewed the Medicare Annual Wellness questionnaire and have noted 1. The patient's medical and social history 2. Their use of alcohol, tobacco or illicit drugs 3. Their current medications and supplements 4. The patient's functional ability including ADL's, fall risks, home safety risks and hearing or visual impairment. 5. Diet and physical activities 6. Evidence for depression or mood disorders 7. Reviewed Updated provider list, see scanned forms and [facility] Snapshot.   The patients weight, height, BMI and visual acuity have been recorded in the chart I have made referrals, counseling and provided education to the patient based review of the above and I have provided the pt with a written personalized care plan for preventive services.  I have provided the patient with a copy of your personalized plan for preventive services. Instructed to take the time to review along with their updated medication list.  [name] PA-C 03/15/17

## 2017-03-16 DIAGNOSIS — R6889 Other general symptoms and signs: Secondary | ICD-10-CM | POA: Diagnosis not present

## 2017-03-16 LAB — RHEUMATOID FACTOR

## 2017-03-16 LAB — CYCLIC CITRUL PEPTIDE ANTIBODY, IGG

## 2017-03-17 ENCOUNTER — Telehealth: Payer: Self-pay | Admitting: Physician Assistant

## 2017-03-17 LAB — ANA: ANA: NEGATIVE

## 2017-03-17 NOTE — Telephone Encounter (Signed)
Gershon Cull from [employer] called to verify that patient was [name], PA's patient. They will be faxing paperwork on patient to the [place] office.

## 2017-03-22 ENCOUNTER — Ambulatory Visit (INDEPENDENT_AMBULATORY_CARE_PROVIDER_SITE_OTHER): Payer: Medicare HMO

## 2017-03-22 DIAGNOSIS — M79641 Pain in right hand: Secondary | ICD-10-CM

## 2017-03-22 DIAGNOSIS — M50223 Other cervical disc displacement at C6-C7 level: Secondary | ICD-10-CM

## 2017-03-22 DIAGNOSIS — M4802 Spinal stenosis, cervical region: Secondary | ICD-10-CM | POA: Diagnosis not present

## 2017-03-22 DIAGNOSIS — M79642 Pain in left hand: Secondary | ICD-10-CM

## 2017-03-22 DIAGNOSIS — M542 Cervicalgia: Secondary | ICD-10-CM | POA: Diagnosis not present

## 2017-03-22 DIAGNOSIS — M47812 Spondylosis without myelopathy or radiculopathy, cervical region: Secondary | ICD-10-CM

## 2017-03-28 DIAGNOSIS — M79642 Pain in left hand: Secondary | ICD-10-CM

## 2017-03-28 DIAGNOSIS — M79641 Pain in right hand: Secondary | ICD-10-CM | POA: Insufficient documentation

## 2017-03-28 DIAGNOSIS — M47812 Spondylosis without myelopathy or radiculopathy, cervical region: Secondary | ICD-10-CM | POA: Insufficient documentation

## 2017-03-28 NOTE — Assessment & Plan Note (Signed)
We will consider referral for epidural steroid injection of his neck if findings pertinent on x-rays versus neurosurgical consultation.  Can also consider nerve conduction studies if findings are equivocal to further evaluate for possible carpal tunnel.

## 2017-03-28 NOTE — Assessment & Plan Note (Signed)
Suspect pain in his hands coming from cervical radiculitis mainly given the distribution.  This does not seem to be consistent with carpal tunnel syndrome although this could be considered in the future.  We will go ahead and obtain an MRI of the cervical spine given the duration of his symptoms in the severity of findings on his x-rays.  He also has issues with polyarthralgia and further evaluation for underlying possible inflammatory arthropathy will be investigated.

## 2017-03-29 ENCOUNTER — Telehealth: Payer: Self-pay | Admitting: Sports Medicine

## 2017-03-29 DIAGNOSIS — M47812 Spondylosis without myelopathy or radiculopathy, cervical region: Secondary | ICD-10-CM

## 2017-03-29 DIAGNOSIS — M79642 Pain in left hand: Secondary | ICD-10-CM

## 2017-03-29 DIAGNOSIS — M79641 Pain in right hand: Secondary | ICD-10-CM

## 2017-03-29 DIAGNOSIS — E559 Vitamin D deficiency, unspecified: Secondary | ICD-10-CM

## 2017-03-29 MED ORDER — CHOLECALCIFEROL 1.25 MG (50000 UT) PO TABS: 1.0000 | ORAL_TABLET | ORAL | 0 refills | Status: DC

## 2017-03-29 NOTE — Telephone Encounter (Signed)
See other associated phone note

## 2017-03-29 NOTE — Telephone Encounter (Signed)
Forwarding to Dr. [name] to contact pt

## 2017-03-29 NOTE — Telephone Encounter (Signed)
I called and spoke with the patient regarding the findings on his MRI as well as his blood work that was negative for underlying rheumatologic issues with a normal CRP and ESR as well as normal anti-CCP, rheumatoid factor and ANA.  His vitamin D was slightly low at 20 and we will go ahead and plan to supplement him over the next 12 weeks with once weekly supplementation.  He does have significant calcifications of the anterior longitudinal ligament consistent with DISH this was communicated with him today.  I am curious and suspect that the C6-7 foraminal narrowing secondary to his spondylosis is likely causing some of the hand pain that he is having and believe he will benefit from an epidural steroid injection at this level.  He has previously been a patient of Dr. Arrie Eastern in [place] and I will plan to refer him back there for further evaluation and consideration of cervical ESI.  Additionally does report he is continuing to have some soreness in his throat and a persistent dry spot.  He has an upcoming evaluation with ENT.  I did discuss the option for further evaluation with a barium swallow study but we will defer to ENT's expertise.  I do suspect that a component of this may be from calcification of the anterior longitudinal ligament given the prominence on plain film x-ray.  I am happy to workup this further will currently defer to ENT's expertise.  The Celebrex is only been minimally helpful.  He can transition to using this as needed only.  I will plan to follow-up with him in 8 weeks and will have our office call to schedule this with him.

## 2017-03-29 NOTE — Telephone Encounter (Signed)
Patient would like to speak with you or Dr. [name] about some clarification on his MRI results in [place]. Confirmed best number to call back is cell phone listed.

## 2017-03-29 NOTE — Telephone Encounter (Signed)
Pt scheduled for 05/24/17 @ 10:30 AM.

## 2017-04-05 DIAGNOSIS — R42 Dizziness and giddiness: Secondary | ICD-10-CM | POA: Diagnosis not present

## 2017-04-06 ENCOUNTER — Telehealth: Payer: Self-pay | Admitting: Sports Medicine

## 2017-04-06 DIAGNOSIS — I428 Other cardiomyopathies: Secondary | ICD-10-CM | POA: Diagnosis not present

## 2017-04-06 DIAGNOSIS — I480 Paroxysmal atrial fibrillation: Secondary | ICD-10-CM | POA: Diagnosis not present

## 2017-04-06 DIAGNOSIS — I493 Ventricular premature depolarization: Secondary | ICD-10-CM | POA: Diagnosis not present

## 2017-04-06 DIAGNOSIS — I340 Nonrheumatic mitral (valve) insufficiency: Secondary | ICD-10-CM | POA: Diagnosis not present

## 2017-04-06 NOTE — Telephone Encounter (Signed)
Please place referral to Dr. Ernestina Patches at [facility] for injection

## 2017-04-06 NOTE — Telephone Encounter (Signed)
Pt called regarding a neck injection, pt stated the DR he thought did neck injs does not, so he wants a recommendation from Dr. [name] on where to go, please call him when you can, thanks!

## 2017-04-06 NOTE — Telephone Encounter (Signed)
Copied to Dr. [name].

## 2017-04-07 ENCOUNTER — Other Ambulatory Visit: Payer: Self-pay

## 2017-04-07 ENCOUNTER — Telehealth: Payer: Self-pay | Admitting: Sports Medicine

## 2017-04-07 DIAGNOSIS — M47812 Spondylosis without myelopathy or radiculopathy, cervical region: Secondary | ICD-10-CM

## 2017-04-07 NOTE — Telephone Encounter (Signed)
Called pt and advised of referral.

## 2017-04-07 NOTE — Telephone Encounter (Signed)
Hailey N Small 19 minutes ago (9:11 AM)      Patient's wife returning missed call from this AM. Please call Tobechukwu back on cell phone [phone]. OK to leave detailed message if unable to answer.

## 2017-04-07 NOTE — Telephone Encounter (Signed)
LMOM for pt to call the office °

## 2017-04-07 NOTE — Telephone Encounter (Signed)
Referral has been placed. 

## 2017-04-07 NOTE — Telephone Encounter (Signed)
Patient's wife returning missed call from this AM. Please call Damyan back on cell phone [phone]. OK to leave detailed message if unable to answer.

## 2017-04-20 ENCOUNTER — Ambulatory Visit (INDEPENDENT_AMBULATORY_CARE_PROVIDER_SITE_OTHER): Payer: Medicare HMO | Admitting: Physical Medicine and Rehabilitation

## 2017-04-20 ENCOUNTER — Encounter (INDEPENDENT_AMBULATORY_CARE_PROVIDER_SITE_OTHER): Payer: Self-pay | Admitting: Physical Medicine and Rehabilitation

## 2017-04-20 VITALS — BP 142/70 | HR 61

## 2017-04-20 DIAGNOSIS — M1811 Unilateral primary osteoarthritis of first carpometacarpal joint, right hand: Secondary | ICD-10-CM

## 2017-04-20 DIAGNOSIS — M1812 Unilateral primary osteoarthritis of first carpometacarpal joint, left hand: Secondary | ICD-10-CM

## 2017-04-20 DIAGNOSIS — M79641 Pain in right hand: Secondary | ICD-10-CM | POA: Diagnosis not present

## 2017-04-20 DIAGNOSIS — M79642 Pain in left hand: Secondary | ICD-10-CM

## 2017-04-20 DIAGNOSIS — M180 Bilateral primary osteoarthritis of first carpometacarpal joints: Secondary | ICD-10-CM

## 2017-04-20 DIAGNOSIS — M542 Cervicalgia: Secondary | ICD-10-CM | POA: Diagnosis not present

## 2017-04-20 DIAGNOSIS — R6889 Other general symptoms and signs: Secondary | ICD-10-CM | POA: Diagnosis not present

## 2017-04-20 NOTE — Progress Notes (Deleted)
Has had neck pain for years and was seeing a Restaurant manager, fast food. Recently the chiropractor has had trouble doing adjustments, so he saw Dr. [name]. Has had increasing pain down arms and into hands which concerns him. Both sides are equal. He works with his hands and has had difficulty with this recently. No numbness or tingling.

## 2017-04-20 NOTE — Progress Notes (Signed)
[name] - 76 y.o. male [name]  Date of birth: October 15, 1941  Office Visit Note: Visit Date: 04/20/2017 PCP: [name], PA Referred by: [name], [place]  Subjective: Chief Complaint  Patient presents with  . Neck - Pain   HPI: 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 He reports worsening bilateral hand pain particularly in the dorsal and radial side of the thumbs bilaterally. He tells me today that the symptoms are predominantly pain without numbness or tingling. It is pain in the hands bilaterally especially when he tries to decorate cakes or do work with his hands. He denies any nocturnal complaints or numbness or tingling with driving or at night. He feels like his hands are weak at times but it's mainly because of the pain has really in the thumb and first digit area. He does not endorse today any pain radiating down the arms to the hands. He reports neck pain for many years and he did have chiropractic care. He said recently the chiropractor was having trouble doing some adjustments and he saw Dr. [name]. MRI was obtained of the cervical spine which is reviewed below and shown to him today and explained to him using the cervical spine model as well. Dr. [name] felt like maybe this was a source of pain from the neck given the MRI findings and the patient's symptoms. He has been no history of carpal tunnel syndrome or electrodiagnostic studies. He's had no prior neck surgery. 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    Review of Systems  Constitutional: Negative  for chills, fever, malaise/fatigue and weight loss.  HENT: Negative for hearing loss and sinus pain.   Eyes: Negative for blurred vision, double vision and photophobia.  Respiratory: Negative for cough and shortness of breath.   Cardiovascular: Negative for chest pain, palpitations and leg swelling.  Gastrointestinal: Negative for abdominal pain, nausea and vomiting.  Genitourinary: Negative for flank pain.  Musculoskeletal: Positive for joint pain. Negative for myalgias.       Bilateral hand pain  Skin: Negative for itching and rash.  Neurological: Negative for tingling, tremors, focal weakness and weakness.  Endo/Heme/Allergies: Negative.   Psychiatric/Behavioral: Negative for depression.  All other systems reviewed and are negative.  Otherwise per HPI.  Assessment & Plan: Visit Diagnoses:  1. Pain in right hand   2. Pain in left hand   3. Arthritis of carpometacarpal (CMC) joint of both thumbs   4. Cervicalgia     Plan: Findings:  Chronic worsening severe at times and seemingly progressive recently of bilateral equal hand pain predominantly over the radial dorsal thumb and first finger particularly around the [facility] joint. 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 He denies any numbness or tingling or paresthesia. At least today and he says recently there's been no radicular symptoms down the arms. He has had chiropractic adjustments in the past for his neck and he's had neck pain off and on.  At least to my clinical impression this is not a radicular pain emanating from the cervical spine. I feel like this is more arthritic problems in the hands bilaterally. I also do not feel like this is carpal tunnel syndrome. Dr. [name] also do not feel like this was carpal tunnel syndrome. I went over the cervical spine MRI with the patient. He has this one area at C6-7 where there is  somewhat of a broad bulge and it does narrow the ventral aspect of the canal but there is really no significant stenosis at this level. It is indented compared to the other levels but not that significant. He does have some facet arthropathy and I feel like his neck pain is probably arthritic as well. In isolation, the area around the hand would be a C6 dermatome but he is really has nothing above this level and no numbness tingling or paresthesia. The nerve that would be likely involved at the C6-7 region would be more of a C7 nerve root.  At this point I don't think is warranted to look at cervical epidural-type injections for this situation I want him to follow up with Dr. [name] to look at more of the [facility] joint arthritis and perhaps diagnostic [facility] joint injection. I did tell the patient if his symptoms declare themselves is more radicular down the arms that we could complete an injection fairly easily and safely and I did go over the procedure and the risks. We also talked about facet joint blocks for mostly axial joint pain of the neck if that became more of an issue. I spent more than 30 minutes speaking face-to-face with the patient with 50% of the time in counseling.    Meds & Orders: No orders of the defined types were placed in this encounter.  No orders of the defined types were placed in this encounter.   Follow-up: Return if symptoms worsen or fail to improve.   Procedures: No procedures performed  No notes on file   Clinical History: Cervical spine MRI dated 03/22/2017  C2-3: Mild facet degenerative disease on the left. The central canal and right foramen are widely patent. Mild left foraminal narrowing noted.   C3-4: Shallow central protrusion without central canal or foraminal stenosis.   C4-5: Mild posterior bony ridging and uncovertebral spurring. The central canal and right foramen are open. Mild left foraminal narrowing is noted.   C5-6: Shallow disc bulge. The central  canal and foramina are open.   C6-7: Shallow broad-based disc bulge nearly effaces the ventral thecal sac. The foramina are open.   C7-T1: Negative.   IMPRESSION: Prominent ossification of the anterior longitudinal ligament consistent with DISH. No worrisome bony lesion is identified.   Shallow broad-based disc bulges C6-7 nearly effaces the ventral thecal sac. No cord deformity.   Mild left foraminal narrowing C2-3 due to facet degenerative change.  He reports that he quit smoking about 31 years ago. He has never used smokeless tobacco.   Recent Labs  07/08/16 1148 03/15/17 1141  HGBA1C 5.5  --   LABURIC  --  6.0    Objective:  VS:  HT:    WT:   BMI:     BP:(!) 142/70  HR:61bpm  TEMP: ( )  RESP:  Physical Exam  Constitutional: He is oriented to person, place, and time. He appears well-developed and well-nourished. No distress.  HENT:  Head: Normocephalic and atraumatic.  Nose: Nose normal.  Mouth/Throat: Oropharynx is clear and moist.  Eyes:  Conjunctivae are normal. Pupils are equal, round, and reactive to light.  Neck: Neck supple. No thyromegaly present.  Cardiovascular: Regular rhythm and intact distal pulses.   Pulmonary/Chest: Effort normal and breath sounds normal.  Abdominal: Soft. He exhibits no distension.  Musculoskeletal: He exhibits no deformity.  Cervical spine range of motion is diminished with extension and rotation with pain at end ranges. The patient does sit with a forward flexed cervical spine.  There are no active trigger points noted.  Shoulder range of motion is full bilaterally without any sign of impingement.  There is a negative Spurling's test bilaterally.  Patient has 5 / 5 strength in all muscle groups of the upper extremities bilaterally without deficits.  The patient has 2+ muscle stretch reflexes at the biceps and brachioradialis bilaterally.  The patient has a negative Hoffman's test bilaterally.  Examination of the hands does not show any  atrophy or synovitis. He does have some CMC joint bossing with concordant pain exquisitely over the [facility] joint to palpation bilaterally. He has a negative Phalen's test bilaterally with intact sensation to light touch.   Lymphadenopathy:    He has no cervical adenopathy.  Neurological: He is alert and oriented to person, place, and time. No sensory deficit. He exhibits normal muscle tone. Coordination normal.  Skin: Skin is warm. No rash noted.  Psychiatric: He has a normal mood and affect. His behavior is normal.  Nursing note and vitals reviewed.   Ortho Exam Imaging: No results found.  Past Medical/Family/Surgical/Social History: Medications & Allergies reviewed per EMR Patient Active Problem List   Diagnosis Date Noted  . Cervical spondylosis without myelopathy 03/28/2017  . Pain in both hands 03/28/2017  . Thoracic aortic atherosclerosis ([place]) 03/04/2017  . Osteoarthritis 03/02/2017  . Community acquired pneumonia of left lower lobe of lung ([place]) 03/02/2017  . Vertigo 03/02/2017  . Cardiomyopathy ([place]) 04/30/2015  . Mitral regurgitation 04/30/2015  . CAD (coronary artery disease) 05/21/2014  . Aortic sclerosis 04/09/2014  . History of pulmonary embolism 12/19/2013  . Diverticulitis of colon (without mention of hemorrhage)(562.11) 10/19/2013  . Diverticulitis of colon 08/26/2013  . Diastolic dysfunction 31/49/7026  . Pulmonary hypertension ([place]) 07/20/2013  . Hypokalemia 03/28/2013  . Paroxysmal atrial fibrillation ([place]) 02/03/2013  . Abnormal stress echo 01/18/2013  . Low back pain 12/05/2012  . Degenerative arthritis of left hip 12/05/2012  . Hard of hearing 08/28/2011  . TACHYCARDIA 07/23/2010  . ABNORMAL FINDINGS, ELEVATED BP W/O HTN 03/14/2007  . HYPERCHOLESTEROLEMIA 12/20/2006  . Hypothyroidism 11/09/2006   Past Medical History:  Diagnosis Date  . Abdominal wall defect, acquired   . Blockage of coronary artery of heart (HCC)    30%  . Hearing aid worn   .  Insulin resistance   . LV dysfunction    reduction  . Osteoarthritis   . Pulmonary embolism (HCC)    Family History  Problem Relation Age of Onset  . Heart disease Mother     pacemaker   Past Surgical History:  Procedure Laterality Date  . ACNE CYST REMOVAL  2012   Back   . APPENDECTOMY    . CARDIAC CATHETERIZATION    . COLECTOMY  11/2013   [facility], 2nd to diverticulitis  . RT hip replacement    . TONSILLECTOMY     Social History   Occupational History  . 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 Smoking status: Former Audiological scientist  date: 12/21/1985  . Smokeless tobacco: Never Used  . Alcohol use No  . Drug use: No  . Sexual activity: Not on file     Comment: self employed, makes cakes, married, 4 adult children,

## 2017-05-12 DIAGNOSIS — R69 Illness, unspecified: Secondary | ICD-10-CM | POA: Diagnosis not present

## 2017-05-18 DIAGNOSIS — R6889 Other general symptoms and signs: Secondary | ICD-10-CM | POA: Diagnosis not present

## 2017-05-24 ENCOUNTER — Encounter: Payer: Self-pay | Admitting: Sports Medicine

## 2017-05-24 ENCOUNTER — Ambulatory Visit: Payer: Self-pay

## 2017-05-24 ENCOUNTER — Ambulatory Visit (INDEPENDENT_AMBULATORY_CARE_PROVIDER_SITE_OTHER): Payer: Medicare HMO | Admitting: Sports Medicine

## 2017-05-24 VITALS — BP 138/72 | HR 63 | Ht 69.0 in | Wt 196.0 lb

## 2017-05-24 DIAGNOSIS — M79642 Pain in left hand: Secondary | ICD-10-CM | POA: Diagnosis not present

## 2017-05-24 DIAGNOSIS — M25562 Pain in left knee: Secondary | ICD-10-CM | POA: Diagnosis not present

## 2017-05-24 DIAGNOSIS — M79641 Pain in right hand: Secondary | ICD-10-CM

## 2017-05-24 DIAGNOSIS — G8929 Other chronic pain: Secondary | ICD-10-CM | POA: Diagnosis not present

## 2017-05-24 DIAGNOSIS — M47812 Spondylosis without myelopathy or radiculopathy, cervical region: Secondary | ICD-10-CM | POA: Diagnosis not present

## 2017-05-24 DIAGNOSIS — M542 Cervicalgia: Secondary | ICD-10-CM

## 2017-05-24 DIAGNOSIS — M25561 Pain in right knee: Secondary | ICD-10-CM

## 2017-05-24 NOTE — Assessment & Plan Note (Signed)
Patient is interested in having this looked at follow-up visit in 4 weeks.  Will need to get 2 view x-rays of bilateral knees at that time.

## 2017-05-24 NOTE — Assessment & Plan Note (Signed)
Patient does have cervical spondylosis that is fairly severe and I suspect that he will intermittently have issues that do cause radiculitis but I agree with Dr. Ernestina Patches at this time that these are likely 2 separate entities especially in the setting of good improvement following the intra-articular injection today of his [facility].  Should continue with anti-inflammatories intermittently but trying to wean off of these is ideal.  If any persistent radicular symptoms can consider epidural steroid injections at that time but will defer

## 2017-05-24 NOTE — Progress Notes (Signed)
OFFICE VISIT NOTE [name]. Petersen, Greg at [facility] [phone]  [name] - 76 y.o. male MRN [id number]  Date of birth: 02/04/1941  Visit Date: 05/24/2017  PCP: [name], PA   Referred by: [name], PA  [place], [place] acting as scribe for Dr. [name].  SUBJECTIVE:   Chief Complaint  Patient presents with  . Follow-up    shoulder, neck, and hand pain.    HPI: As below and per problem based documentation when appropriate.  Pt presents today in follow-up of neck and shoulders.   Pt reports that pain in chronic.  No known injury or trauma.   The pain is described as aching pain and is rated as 6/10 with movement. Pain is not constant.   Worsened with movement.  Improves with adjustments.  Therapies tried include : Pt does not take any medication for the pain. He has tried Meloxicam in the past and got minimal relief. Pt has also been seen by a chiropractor in the past but reports that adjustments got more difficult d/t calcifications.   Pt has concerns about arthritis in both hands. The pain is mainly in the palm of his hand along the base of the first [facility] and radiating over towards the fourth metacarpal.  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 Pain has worsened over the past 2 years.   Pt denies fever, chills, night sweats, unintentional weight loss or weight gain.     Review of Systems  Constitutional: Negative for chills and fever.  Respiratory: Positive for shortness of breath. Negative for wheezing.   Cardiovascular: Negative for chest pain, palpitations and leg swelling.  Musculoskeletal: Positive for neck pain. Negative for falls.  Neurological: Positive for dizziness and tingling. Negative for headaches.  Endo/Heme/Allergies: Does not  bruise/bleed easily.    Otherwise per HPI.  HISTORY & PERTINENT PRIOR DATA:  No specialty comments available. He reports that he quit smoking about 31 years ago. He has never used smokeless tobacco.   Recent Labs  07/08/16 1148 03/15/17 1141  HGBA1C 5.5  --   LABURIC  --  6.0   Medications & Allergies reviewed per EMR Patient Active Problem List   Diagnosis Date Noted  . Chronic pain of both knees 05/24/2017  . Cervical spondylosis without myelopathy 03/28/2017  . Pain in both hands 03/28/2017  . Thoracic aortic atherosclerosis ([place]) 03/04/2017  . Osteoarthritis 03/02/2017  . Community acquired pneumonia of left lower lobe of lung ([place]) 03/02/2017  . Vertigo 03/02/2017  . Cardiomyopathy ([place]) 04/30/2015  . Mitral regurgitation 04/30/2015  . CAD (coronary artery disease) 05/21/2014  . Aortic sclerosis 04/09/2014  . History of pulmonary embolism 12/19/2013  . Diverticulitis of colon (without mention of hemorrhage)(562.11) 10/19/2013  . Diverticulitis of colon 08/26/2013  . Diastolic dysfunction 60/09/9322  . Pulmonary hypertension ([place]) 07/20/2013  . Hypokalemia 03/28/2013  . Paroxysmal atrial fibrillation ([place]) 02/03/2013  . Abnormal stress echo 01/18/2013  . Low back pain 12/05/2012  . Degenerative arthritis of left hip 12/05/2012  . Hard of hearing 08/28/2011  . TACHYCARDIA 07/23/2010  . ABNORMAL FINDINGS, ELEVATED BP W/O HTN 03/14/2007  . HYPERCHOLESTEROLEMIA 12/20/2006  . Hypothyroidism 11/09/2006   Past Medical History:  Diagnosis Date  . Abdominal wall defect, acquired   . Blockage  of coronary artery of heart (HCC)    30%  . Hearing aid worn   . Insulin resistance   . LV dysfunction    reduction  . Osteoarthritis   . Pulmonary embolism (HCC)    Family History  Problem Relation Age of Onset  . Heart disease Mother        pacemaker   Past Surgical History:  Procedure Laterality Date  . ACNE CYST REMOVAL  2012   Back   . APPENDECTOMY    . CARDIAC  CATHETERIZATION    . COLECTOMY  11/2013   [facility], 2nd to diverticulitis  . RT hip replacement    . TONSILLECTOMY     Social History   Occupational History  . 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 Smoking status: Former Smoker    Quit date: 12/21/1985  . Smokeless tobacco: Never Used  . Alcohol use No  . Drug use: No  . 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 Pupils are equal. EOM intact without nystagmus No clubbing or cyanosis of the extremities appreciated No significant rashes/lesions/ulcerations overlying the examined area. Radial pulses 2+/4.  No significant generalized UE edema. Sensation intact to light touch in upper extremities.  Exam neck: Overall limited range of motion.  No significant neural tension signs today.  Negative Spurling's.  Bilateral hands: He has bossing of the CMC joints.  No pain with axial loading in neutral position but with hyperflexion of the thumb and axial loading crepitation and moderate pain the patient reports as being identical to his main symptoms occurs.      No results found. ASSESSMENT & PLAN:   Problem List Items Addressed This Visit    Cervical spondylosis without myelopathy    Patient does have cervical spondylosis that is fairly severe and I suspect that he will intermittently have issues that do cause radiculitis but I agree with Dr. Ernestina Patches at this time that these are likely 2 separate entities especially in the setting of good improvement following the intra-articular injection today of his [place].  Should continue with anti-inflammatories intermittently but trying  to wean off of these is ideal.  If any persistent radicular symptoms can consider epidural steroid injections at that time but will defer      Pain in both hands    Patient's right hand is significantly worse than the left but he has pain within the C6 distribution of both hands versus referred pain in distally into the hand from [facility] arthritis which she does have.  Ultimately diagnostic and therapeutic intra-articular injection into the right [facility] performed today.  Patient does have worsening symptoms in the hyperflexed position which is what he performs during work.  This is asymmetrically worn the joint.  He is not interested in any type of bracing this time.  He is only minimal improvements with meloxicam.  We can consider trying topical Voltaren going forward if he is having persistent symptoms but he would like to start with the injection.  ++++++++++++++++++++++++++++++++++++++++++++ PROCEDURE NOTE -  ULTRASOUND GUIDEDINJECTION: Right first [facility] Images  were obtained and interpreted by myself, Teresa Coombs, DO  Images have been saved and stored to PACS system. Images obtained on: GE S7 Ultrasound machine  ULTRASOUND FINDINGS: Marked degenerative changes with small joint effusion  DESCRIPTION OF PROCEDURE:  The patient's clinical condition is marked by substantial pain and/or significant functional disability. Other conservative therapy has not provided relief, is contraindicated, or not appropriate. There is a reasonable likelihood that injection will significantly improve the patient's pain and/or functional impairment. After discussing the risks, benefits and expected outcomes of the injection and all questions were reviewed and answered, the patient wished to undergo the above named procedure. Verbal consent was obtained. The ultrasound was used to identify the target structure and adjacent neurovascular structures. The skin was then prepped in sterile fashion and the target structure was  injected under direct visualization using sterile technique as below: PREP: Alcohol, Ethel Chloride APPROACH: In plane, single injection, 25-gauge 1.5 inch needle  INJECTATE: 0.5cc 0.5% marcaine, 0.5cc 40mg  DepoMedrol ASPIRATE: [name] DRESSING: Band-Aid  Post procedural instructions including recommending icing and warning signs for infection were reviewed. This procedure was well tolerated and there were no complications.   IMPRESSION: Succesful US Guided Injection          Chronic pain of both knees    Patient is interested in having this looked at follow-up visit in 4 weeks.  Will need to get 2 view x-rays of bilateral knees at that time.       Other Visit Diagnoses    Right hand pain    -  Primary   Relevant Orders   US GUIDED NEEDLE PLACEMENT(NO LINKED CHARGES)   Neck pain          Follow-up: Return in about 4 weeks (around 06/21/2017).   CMA/ATC served as Education administrator during this visit. History, Physical, and Plan performed by medical provider. Documentation and orders reviewed and attested to.      Teresa Coombs, [place] [facility] Physician

## 2017-05-24 NOTE — Assessment & Plan Note (Signed)
Patient's right hand is significantly worse than the left but he has pain within the C6 distribution of both hands versus referred pain in distally into the hand from [facility] arthritis which she does have.  Ultimately diagnostic and therapeutic intra-articular injection into the right [facility] performed today.  Patient does have worsening symptoms in the hyperflexed position which is what he performs during work.  This is asymmetrically worn the joint.  He is not interested in any type of bracing this time.  He is only minimal improvements with meloxicam.  We can consider trying topical Voltaren going forward if he is having persistent symptoms but he would like to start with the injection.  ++++++++++++++++++++++++++++++++++++++++++++ PROCEDURE NOTE -  ULTRASOUND GUIDEDINJECTION: Right first [facility] Images were obtained and interpreted by myself, Teresa Coombs, DO  Images have been saved and stored to PACS system. Images obtained on: GE S7 Ultrasound machine  ULTRASOUND FINDINGS: Marked degenerative changes with small joint effusion  DESCRIPTION OF PROCEDURE:  The patient's clinical condition is marked by substantial pain and/or significant functional disability. Other conservative therapy has not provided relief, is contraindicated, or not appropriate. There is a reasonable likelihood that injection will significantly improve the patient's pain and/or functional impairment. After discussing the risks, benefits and expected outcomes of the injection and all questions were reviewed and answered, the patient wished to undergo the above named procedure. Verbal consent was obtained. The ultrasound was used to identify the target structure and adjacent neurovascular structures. The skin was then prepped in sterile fashion and the target structure was injected under direct visualization using sterile technique as below: PREP: Alcohol, Ethel Chloride APPROACH: In plane, single injection, 25-gauge 1.5 inch needle   INJECTATE: 0.5cc 0.5% marcaine, 0.5cc 40mg  DepoMedrol ASPIRATE: [name] DRESSING: Band-Aid  Post procedural instructions including recommending icing and warning signs for infection were reviewed. This procedure was well tolerated and there were no complications.   IMPRESSION: Succesful US Guided Injection

## 2017-05-25 DIAGNOSIS — M5136 Other intervertebral disc degeneration, lumbar region: Secondary | ICD-10-CM | POA: Diagnosis not present

## 2017-05-25 DIAGNOSIS — M4726 Other spondylosis with radiculopathy, lumbar region: Secondary | ICD-10-CM | POA: Diagnosis not present

## 2017-05-25 DIAGNOSIS — M5416 Radiculopathy, lumbar region: Secondary | ICD-10-CM | POA: Diagnosis not present

## 2017-05-27 ENCOUNTER — Other Ambulatory Visit: Payer: Self-pay | Admitting: Family Medicine

## 2017-06-04 ENCOUNTER — Ambulatory Visit (INDEPENDENT_AMBULATORY_CARE_PROVIDER_SITE_OTHER): Payer: Medicare HMO | Admitting: Physician Assistant

## 2017-06-04 ENCOUNTER — Encounter: Payer: Self-pay | Admitting: Physician Assistant

## 2017-06-04 VITALS — BP 118/60 | HR 69 | Temp 98.1°F | Resp 14 | Ht 69.0 in | Wt 195.0 lb

## 2017-06-04 DIAGNOSIS — L237 Allergic contact dermatitis due to plants, except food: Secondary | ICD-10-CM | POA: Diagnosis not present

## 2017-06-04 DIAGNOSIS — L255 Unspecified contact dermatitis due to plants, except food: Secondary | ICD-10-CM

## 2017-06-04 MED ORDER — CLOBETASOL PROPIONATE 0.05 % EX OINT
1.0000 | TOPICAL_OINTMENT | Freq: Two times a day (BID) | CUTANEOUS | 0 refills | Status: DC
Start: 2017-06-04 — End: 2017-06-14

## 2017-06-04 NOTE — Progress Notes (Signed)
Pre visit review using our clinic review tool, if applicable. No additional management support is needed unless otherwise documented below in the visit note. 

## 2017-06-04 NOTE — Progress Notes (Signed)
Patient presents to clinic today c/o pruritic rash present for the past couple of days. Endorses rash was blistering and first noted on LLE - inner thigh and medial popliteal region. Has since spear to RLE and L arm. Denies painful rash. Denies change to soaps, lotions or other hygiene products. Denies sick contact. Is concerned about rhus dermatitis.  Denies known direct contact but is afraid he got from his dog who is an Therapist, occupational.    Past Medical History:  Diagnosis Date  . Abdominal wall defect, acquired   . Blockage of coronary artery of heart (HCC)    30%  . Hearing aid worn   . Insulin resistance   . LV dysfunction    reduction  . Osteoarthritis   . Pulmonary embolism [facility])     Current Outpatient Prescriptions on File Prior to Visit  Medication Sig Dispense Refill  . apixaban (ELIQUIS) 5 MG TABS tablet Take 5 mg by mouth 2 (two) times daily.     . Cholecalciferol 50000 units TABS Take 1 tablet by mouth once a week. 12 tablet 0  . levothyroxine (SYNTHROID, LEVOTHROID) 75 MCG tablet Take 1 tablet (75 mcg total) by mouth daily before breakfast. NEED FOLLOW UP APPOINTMENT FOR MORE REFILLS 30 tablet 5  . meclizine (ANTIVERT) 25 MG tablet Take 25 mg by mouth as needed.     . meloxicam (MOBIC) 15 MG tablet TAKE ONE TABLET BY MOUTH ONCE DAILY 90 tablet 1  . metoprolol (LOPRESSOR) 50 MG tablet Take 50 mg by mouth.    . Multiple Vitamin (MULTIVITAMIN) tablet Take 1 tablet by mouth daily.      [name] omeprazole (PRILOSEC) 20 MG capsule Take 20 mg by mouth daily.      . simvastatin (ZOCOR) 40 MG tablet Take 1 tablet (40 mg total) by mouth daily at 6 PM. 90 tablet 1   No current facility-administered medications on file prior to visit.     Allergies  Allergen Reactions  . Flagyl [Metronidazole] Nausea And Vomiting    States IV form causes to allergy  . Lipitor [Atorvastatin] Other (See Comments)    Myalgia    Family History  Problem Relation Age of Onset  . Heart disease  Mother        pacemaker    Social History   Social History  . Marital status: Married    Spouse name: [name]  . Number of children: [name]  . Years of education: [name]   Occupational History  . 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 Smoking status: Former Smoker    Quit date: 12/21/1985  . Smokeless tobacco: Never Used  . Alcohol use No  . Drug use: No  . 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 None   Social History Narrative   Born in [place].     Review of Systems - See HPI.  All other ROS are negative.  BP 118/60   Pulse 69   Temp 98.1 F (36.7 C) (Oral)   Resp 14   Ht 5\' 9"  (1.753 m)   Wt 195 lb (88.5 kg)   SpO2 98%   BMI 28.80 kg/m   Physical Exam  Constitutional: He is oriented to person, place, and time and well-developed, well-nourished, and in no distress.  HENT:  Head: Normocephalic and atraumatic.  Eyes:  Conjunctivae are normal.  Neck: Neck supple.  Cardiovascular: Normal rate, regular rhythm, normal heart sounds and intact distal pulses.   Pulmonary/Chest: Effort normal and breath sounds normal.  Neurological: He is alert and oriented to person, place, and time.  Skin: Skin is warm and dry.  Clustered papulovesicular rash noted of left inner thigh, R anterior thigh, R forearm. Some areas have slight linear distribution. No evidence of severe excoriation or superimposed bacterial infection.  Psychiatric: Affect normal.  Vitals reviewed.   Recent Results (from the past 2160 hour(s))  CBC     Status: None   Collection Time: 03/15/17 11:41 AM  Result Value Ref Range   WBC 6.6 4.0 - 10.5 K/uL   RBC 4.58 4.22 - 5.81 Mil/uL   Platelets 222.0 150.0 - 400.0 K/uL   Hemoglobin 13.4 13.0 - 17.0 g/dL   HCT 39.7 39.0 - 52.0 %   MCV 86.7 78.0 - 100.0 fl   MCHC 33.9 30.0 - 36.0 g/dL   RDW 14.3 11.5 - 15.5 %  VITAMIN D 25 Hydroxy (Vit-D  Deficiency, Fractures)     Status: Abnormal   Collection Time: 03/15/17 11:41 AM  Result Value Ref Range   VITD 20.20 (L) 30.00 - 100.00 ng/mL  Comprehensive metabolic panel     Status: Abnormal   Collection Time: 03/15/17 11:41 AM  Result Value Ref Range   Sodium 141 135 - 145 mEq/L   Potassium 4.4 3.5 - 5.1 mEq/L   Chloride 108 96 - 112 mEq/L   CO2 26 19 - 32 mEq/L   Glucose, Bld 100 (H) 70 - 99 mg/dL   BUN 13 6 - 23 mg/dL   Creatinine, Ser 0.89 0.40 - 1.50 mg/dL   Total Bilirubin 0.7 0.2 - 1.2 mg/dL   Alkaline Phosphatase 37 (L) 39 - 117 U/L   AST 18 0 - 37 U/L   ALT 13 0 - 53 U/L   Total Protein 6.8 6.0 - 8.3 g/dL   Albumin 4.5 3.5 - 5.2 g/dL   Calcium 9.3 8.4 - 10.5 mg/dL   GFR 88.57 >60.00 mL/min  Sedimentation rate     Status: None   Collection Time: 03/15/17 11:41 AM  Result Value Ref Range   Sed Rate 6 0 - 20 mm/hr  C-reactive protein     Status: Abnormal   Collection Time: 03/15/17 11:41 AM  Result Value Ref Range   CRP 0.2 (L) 0.5 - 20.0 mg/dL  Uric acid     Status: None   Collection Time: 03/15/17 11:41 AM  Result Value Ref Range   Uric Acid, Serum 6.0 4.0 - 7.8 mg/dL  Rheumatoid factor     Status: None   Collection Time: 03/15/17 11:51 AM  Result Value Ref Range   Rhuematoid fact SerPl-aCnc <14 <14 IU/mL  ANA     Status: None   Collection Time: 03/15/17 11:51 AM  Result Value Ref Range   Anit Nuclear Antibody(ANA) NEG NEGATIVE  Cyclic citrul peptide antibody, IgG     Status: None   Collection Time: 03/15/17 11:51 AM  Result Value Ref Range   Cyclic Citrullin Peptide Ab <16 Units    Comment:   Reference Range Negative               < 20 Weak Positive            20 - 39 Moderate Positive        40 - 59 Strong Positive        >  59     Assessment/Plan: 1. Rhus dermatitis Contact dermatitis, most likely rhus. Mild in severity. Discussed OTC medications and supportive measures. Rx Clobetasol to use as directed. Follow-up if not resolving. Any worsening  symptoms, systemic steroid may be needed.    Leeanne Rio, PA-C

## 2017-06-04 NOTE — Patient Instructions (Signed)
Please apply the clobetasol to the rash twice daily. Avoid scratching the area. Cold compresses and over-the-counter Sarna lotion may be beneficial. Witch hazel applied to the area can be soothing and help the rash dry up quicker.   Follow-up if symptoms are not gradually resolving.

## 2017-06-05 ENCOUNTER — Emergency Department
Admission: EM | Admit: 2017-06-05 | Discharge: 2017-06-05 | Disposition: A | Discharge status: Home / Self Care | Payer: Medicare HMO | Source: Home / Self Care | Attending: Family Medicine | Admitting: Family Medicine

## 2017-06-05 ENCOUNTER — Encounter: Payer: Self-pay | Admitting: Emergency Medicine

## 2017-06-05 DIAGNOSIS — R21 Rash and other nonspecific skin eruption: Secondary | ICD-10-CM

## 2017-06-05 DIAGNOSIS — L259 Unspecified contact dermatitis, unspecified cause: Secondary | ICD-10-CM | POA: Diagnosis not present

## 2017-06-05 MED ORDER — METHYLPREDNISOLONE ACETATE 80 MG/ML IJ SUSP
80.0000 mg | Freq: Once | INTRAMUSCULAR | Status: AC
  Administered 2017-06-05: 80 mg via INTRAMUSCULAR

## 2017-06-05 MED ORDER — CETIRIZINE HCL 10 MG PO TABS: 10.0000 mg | ORAL_TABLET | Freq: Every day | ORAL | 0 refills | Status: DC

## 2017-06-05 NOTE — ED Triage Notes (Signed)
Pt was seen yesterday, dx w/contact dermatitis, given ointment, no better, actually worse.  The areas are now blistering, itching and burning.

## 2017-06-06 NOTE — ED Provider Notes (Signed)
CSN: [id number]     Arrival date & time 06/05/17  1705 History   First MD Initiated Contact with Patient 06/05/17 1727     Chief Complaint  Patient presents with  . Rash   (Consider location/radiation/quality/duration/timing/severity/associated sxs/prior Treatment) HPI  LONZA SHIMABUKURO is a 76 y.o. male presenting to UC with c/o gradually worsening rash to middle of Left thigh, middle of Right thigh and now onto Left upper arm. Rash started on Left middle thigh 3-4 days ago. He was seen by a PA with [facility] yesterday, dx with contact dermatitis and given a steroid cream.  Pt notes the rash has only worsened, it is now blistering and burning. Pain is mild burning and itching.  Denies fever, chills, n/v/d. Denies known allergies. No new soaps, lotions, or medications. Denies new clothing.  He does have 2 small dogs that are rarely outside but otherwise he cannot think of anything he came in contact with that started the rash.  He has not taken any antihistamines or other medications yet to try to help with the rash.    Past Medical History:  Diagnosis Date  . Abdominal wall defect, acquired   . Blockage of coronary artery of heart (HCC)    30%  . Hearing aid worn   . Insulin resistance   . LV dysfunction    reduction  . Osteoarthritis   . Pulmonary embolism [facility])    Past Surgical History:  Procedure Laterality Date  . ACNE CYST REMOVAL  2012   Back   . APPENDECTOMY    . CARDIAC CATHETERIZATION    . COLECTOMY  11/2013   [facility], 2nd to diverticulitis  . RT hip replacement    . TONSILLECTOMY     Family History  Problem Relation Age of Onset  . Heart disease Mother        pacemaker   Social History  Substance Use Topics  . Smoking status: Former Smoker    Quit date: 12/21/1985  . Smokeless tobacco: Never Used  . Alcohol use No    Review of Systems  Constitutional: Negative for chills and fever.  Gastrointestinal: Negative for diarrhea, nausea and vomiting.    Musculoskeletal: Negative for joint swelling and myalgias.  Skin: Positive for color change and rash. Negative for wound.    Allergies  Flagyl [metronidazole] and Lipitor [atorvastatin]  Home Medications   Prior to Admission medications   Medication Sig Start Date End Date Taking? Authorizing Provider  apixaban (ELIQUIS) 5 MG TABS tablet Take 5 mg by mouth 2 (two) times daily.  04/08/17   [provider]  cetirizine (ZYRTEC) 10 MG tablet Take 1 tablet (10 mg total) by mouth daily. 06/05/17   Noe Gens, PA-C  Cholecalciferol 50000 units TABS Take 1 tablet by mouth once a week. 03/29/17   Gerda Diss, DO  clobetasol ointment (TEMOVATE) 1.77 % Apply 1 application topically 2 (two) times daily. 06/04/17   Brunetta Jeans, PA-C  levothyroxine (SYNTHROID, LEVOTHROID) 75 MCG tablet Take 1 tablet (75 mcg total) by mouth daily before breakfast. NEED FOLLOW UP APPOINTMENT FOR MORE REFILLS 03/02/17   [name], PA  meclizine (ANTIVERT) 25 MG tablet Take 25 mg by mouth as needed.  01/15/17   [provider]  meloxicam (MOBIC) 15 MG tablet TAKE ONE TABLET BY MOUTH ONCE DAILY 05/27/17   Hali Marry, MD  metoprolol (LOPRESSOR) 50 MG tablet Take 50 mg by mouth. 05/16/15   [provider]  Multiple Vitamin (  MULTIVITAMIN) tablet Take 1 tablet by mouth daily.      [provider]  omeprazole (PRILOSEC) 20 MG capsule Take 20 mg by mouth daily.      [provider]  simvastatin (ZOCOR) 40 MG tablet Take 1 tablet (40 mg total) by mouth daily at 6 PM. 01/08/17   Hali Marry, MD   Meds Ordered and Administered this Visit   Medications  methylPREDNISolone acetate (DEPO-MEDROL) injection 80 mg (80 mg Intramuscular Given 06/05/17 1749)    BP 117/72 (BP Location: Left Arm)   Pulse 80   Temp 98.6 F (37 C) (Oral)   Ht 5\' 11"  (1.803 m)   Wt 193 lb 12 oz (87.9 kg)   SpO2 98%   BMI 27.02 kg/m  No data found.   Physical Exam   Constitutional: He is oriented to person, place, and time. He appears well-developed and well-nourished. No distress.  HENT:  Head: Normocephalic and atraumatic.  Eyes: EOM are normal.  Neck: Normal range of motion.  Cardiovascular: Normal rate.   Pulmonary/Chest: Effort normal.  Musculoskeletal: Normal range of motion. He exhibits no edema or tenderness.  Neurological: He is alert and oriented to person, place, and time.  Skin: Skin is warm and dry. Rash noted. He is not diaphoretic. There is erythema.     Left medial thigh from groin to knee: diffuse erythematous maculopapular rash with overlying small vesicles and larger bullae. Scant clear discharge. Minimally tender. Right medial thigh: erythematous papules w/o blistering. Left upper arm: erythematous papules w/o blistering.  Psychiatric: He has a normal mood and affect. His behavior is normal.  Nursing note and vitals reviewed.   Urgent Care Course     Procedures (including critical care time)  Labs Review Labs Reviewed - No data to display  Imaging Review No results found.   MDM   1. Blistering rash   2. Contact dermatitis and eczema    Rash appears consistent with contact dermatitis. Doubt shingles given diffuse nature of the rash.  Pt was prescribed a topical steroid but has not taken any systemic medications.  Depo-medrol 80mg  IM given in UC.  Pt notes he completed a prednisone dose back for lower back pain about 4 days ago.  Will not send pt home with another prednisone taper at this time, will consider if rash still not improving by next week.  Rx: cetirizine F/u with PCP in 3-4 days if not improving, sooner if worsening, or may come to UC for recheck.    Noe Gens, [place] 06/06/17 1114

## 2017-06-07 ENCOUNTER — Encounter: Payer: Self-pay | Admitting: Physician Assistant

## 2017-06-07 ENCOUNTER — Ambulatory Visit (INDEPENDENT_AMBULATORY_CARE_PROVIDER_SITE_OTHER): Payer: Medicare HMO | Admitting: Physician Assistant

## 2017-06-07 VITALS — BP 132/72 | HR 92 | Temp 97.9°F | Resp 14 | Ht 69.0 in | Wt 195.0 lb

## 2017-06-07 DIAGNOSIS — L259 Unspecified contact dermatitis, unspecified cause: Secondary | ICD-10-CM

## 2017-06-07 MED ORDER — PREDNISONE 10 MG PO TABS: ORAL_TABLET | ORAL | 0 refills | Status: DC

## 2017-06-07 NOTE — Patient Instructions (Signed)
Please start steroid taper as directed. Continue antihistamine and topical steroid. Make sure to have the dogs bathed and groomed to avoid repeat exposure. Keep them out of the area of the yard that was sprayed.   Keep skin clean and dry. Oatmeal baths will be beneficial. Avoid heat and direct sunlight as much as possible.  Follow-up 1 week. If there is any worsening symptoms we may need ER evaluation or call me so I can get you in with Dermatology ASAP.

## 2017-06-07 NOTE — Progress Notes (Signed)
Patient presents to clinic today c/o worsening dermatitis since assessment Friday. At last visit, patient was started on triamcinolone cream to apply BID. States he started as directed but noted new areas appearing the next day. Was seen at [facility] on Saturday and was given 80 of Depo Medrol. Noted significant improvement that day but woke up the next day with significant worsening of symptoms with new lesions noted of abdomen and chest. Denies fever, chills. Again notes that the only trigger could be from a plant that his dogs rolled around in. Wife is present for this visit and notes that they did have there yard sprayed last week and the dogs likely have gotten the chemicals on their fur.   Past Medical History:  Diagnosis Date  . Abdominal wall defect, acquired   . Blockage of coronary artery of heart (HCC)    30%  . Hearing aid worn   . Insulin resistance   . LV dysfunction    reduction  . Osteoarthritis   . Pulmonary embolism [facility])     Current Outpatient Prescriptions on File Prior to Visit  Medication Sig Dispense Refill  . apixaban (ELIQUIS) 5 MG TABS tablet Take 5 mg by mouth 2 (two) times daily.     . Cholecalciferol 50000 units TABS Take 1 tablet by mouth once a week. 12 tablet 0  . clobetasol ointment (TEMOVATE) 6.38 % Apply 1 application topically 2 (two) times daily. 30 g 0  . levothyroxine (SYNTHROID, LEVOTHROID) 75 MCG tablet Take 1 tablet (75 mcg total) by mouth daily before breakfast. NEED FOLLOW UP APPOINTMENT FOR MORE REFILLS 30 tablet 5  . meclizine (ANTIVERT) 25 MG tablet Take 25 mg by mouth as needed.     . meloxicam (MOBIC) 15 MG tablet TAKE ONE TABLET BY MOUTH ONCE DAILY 90 tablet 1  . metoprolol (LOPRESSOR) 50 MG tablet Take 50 mg by mouth.    . Multiple Vitamin (MULTIVITAMIN) tablet Take 1 tablet by mouth daily.      [name] omeprazole (PRILOSEC) 20 MG capsule Take 20 mg by mouth daily.      . simvastatin (ZOCOR) 40 MG tablet Take 1 tablet (40 mg total) by mouth daily at  6 PM. 90 tablet 1  . cetirizine (ZYRTEC) 10 MG tablet Take 1 tablet (10 mg total) by mouth daily. (Patient not taking: Reported on 06/07/2017) 30 tablet 0   No current facility-administered medications on file prior to visit.     Allergies  Allergen Reactions  . Flagyl [Metronidazole] Nausea And Vomiting    States IV form causes to allergy  . Lipitor [Atorvastatin] Other (See Comments)    Myalgia    Family History  Problem Relation Age of Onset  . Heart disease Mother        pacemaker    Social History   Social History  . Marital status: Married    Spouse name: [name]  . Number of children: [name]  . Years of education: [name]   Occupational History  . 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 Smoking status: Former Smoker    Quit date: 12/21/1985  . Smokeless tobacco: Never Used  . Alcohol use No  . Drug use: No  . 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 None   Social History Narrative   Born in [place].  Review of Systems - See HPI.  All other ROS are negative.  BP 132/72   Pulse 92   Temp 97.9 F (36.6 C) (Oral)   Resp 14   Ht 5\' 9"  (1.753 m)   Wt 195 lb (88.5 kg)   SpO2 98%   BMI 28.80 kg/m   Physical Exam  Constitutional: He is oriented to person, place, and time and well-developed, well-nourished, and in no distress.  HENT:  Head: Normocephalic and atraumatic.  Eyes: Conjunctivae are normal.  Neck: Neck supple.  Cardiovascular: Normal rate, regular rhythm, normal heart sounds and intact distal pulses.   Pulmonary/Chest: Effort normal and breath sounds normal. No respiratory distress. He has no wheezes. He has no rales. He exhibits no tenderness.  Neurological: He is alert and oriented to person, place, and time.  Skin:  Rash of contact dermatitis again noted, mostly focused on bilateral lower extremities with new areas of linear  rash noted, along with new lesions of chest and abdomen bilaterally. No evidence of superimposed cellulitis noted.  Psychiatric: Affect normal.  Vitals reviewed.   Recent Results (from the past 2160 hour(s))  CBC     Status: None   Collection Time: 03/15/17 11:41 AM  Result Value Ref Range   WBC 6.6 4.0 - 10.5 K/uL   RBC 4.58 4.22 - 5.81 Mil/uL   Platelets 222.0 150.0 - 400.0 K/uL   Hemoglobin 13.4 13.0 - 17.0 g/dL   HCT 39.7 39.0 - 52.0 %   MCV 86.7 78.0 - 100.0 fl   MCHC 33.9 30.0 - 36.0 g/dL   RDW 14.3 11.5 - 15.5 %  VITAMIN D 25 Hydroxy (Vit-D Deficiency, Fractures)     Status: Abnormal   Collection Time: 03/15/17 11:41 AM  Result Value Ref Range   VITD 20.20 (L) 30.00 - 100.00 ng/mL  Comprehensive metabolic panel     Status: Abnormal   Collection Time: 03/15/17 11:41 AM  Result Value Ref Range   Sodium 141 135 - 145 mEq/L   Potassium 4.4 3.5 - 5.1 mEq/L   Chloride 108 96 - 112 mEq/L   CO2 26 19 - 32 mEq/L   Glucose, Bld 100 (H) 70 - 99 mg/dL   BUN 13 6 - 23 mg/dL   Creatinine, Ser 0.89 0.40 - 1.50 mg/dL   Total Bilirubin 0.7 0.2 - 1.2 mg/dL   Alkaline Phosphatase 37 (L) 39 - 117 U/L   AST 18 0 - 37 U/L   ALT 13 0 - 53 U/L   Total Protein 6.8 6.0 - 8.3 g/dL   Albumin 4.5 3.5 - 5.2 g/dL   Calcium 9.3 8.4 - 10.5 mg/dL   GFR 88.57 >60.00 mL/min  Sedimentation rate     Status: None   Collection Time: 03/15/17 11:41 AM  Result Value Ref Range   Sed Rate 6 0 - 20 mm/hr  C-reactive protein     Status: Abnormal   Collection Time: 03/15/17 11:41 AM  Result Value Ref Range   CRP 0.2 (L) 0.5 - 20.0 mg/dL  Uric acid     Status: None   Collection Time: 03/15/17 11:41 AM  Result Value Ref Range   Uric Acid, Serum 6.0 4.0 - 7.8 mg/dL  Rheumatoid factor     Status: None   Collection Time: 03/15/17 11:51 AM  Result Value Ref Range   Rhuematoid fact SerPl-aCnc <14 <14 IU/mL  ANA     Status: None   Collection Time: 03/15/17 11:51 AM  Result Value Ref Range  Anit Nuclear  Antibody(ANA) NEG NEGATIVE  Cyclic citrul peptide antibody, IgG     Status: None   Collection Time: 03/15/17 11:51 AM  Result Value Ref Range   Cyclic Citrullin Peptide Ab <16 Units    Comment:   Reference Range Negative               < 20 Weak Positive            20 - 39 Moderate Positive        40 - 59 Strong Positive        > 59     Assessment/Plan: 1. Contact dermatitis, unspecified contact dermatitis type, unspecified trigger Concern for rebound dermatitis s/p injection not followed with a steroid taper as there was initial significant improvement with steroid injection followed with significant worsening. Will start steroid taper - 14 day taper to help resolve rash and avoid rebound. Continue antihistamine. Supportive measures reviewed. They are to have the dogs bathed. Wash linens. Keep dogs out of the recently sprayed yard to avoid recurrent exposures. Follow-up discussed. If symptoms are not improving from this point forward, will need Derm assessment.   Leeanne Rio, PA-C

## 2017-06-07 NOTE — Progress Notes (Signed)
Pre visit review using our clinic review tool, if applicable. No additional management support is needed unless otherwise documented below in the visit note. 

## 2017-06-08 ENCOUNTER — Telehealth: Payer: Self-pay | Admitting: Physician Assistant

## 2017-06-08 DIAGNOSIS — L538 Other specified erythematous conditions: Secondary | ICD-10-CM | POA: Diagnosis not present

## 2017-06-08 DIAGNOSIS — L309 Dermatitis, unspecified: Secondary | ICD-10-CM | POA: Diagnosis not present

## 2017-06-08 NOTE — Telephone Encounter (Signed)
I called the [facility] to cancel the rx for the Prednisone. I have advised patient wife that the rx has been cancelled.

## 2017-06-08 NOTE — Telephone Encounter (Signed)
Wife states that someone needs to call [facility] to stop the medication from being sent to pt prednisone, pt was able to p/u at [facility] and does not want to be charged for this.

## 2017-06-14 ENCOUNTER — Encounter: Payer: Self-pay | Admitting: Physician Assistant

## 2017-06-14 ENCOUNTER — Ambulatory Visit (INDEPENDENT_AMBULATORY_CARE_PROVIDER_SITE_OTHER): Payer: Medicare HMO | Admitting: Physician Assistant

## 2017-06-14 VITALS — BP 152/70 | HR 71 | Ht 69.0 in | Wt 195.0 lb

## 2017-06-14 DIAGNOSIS — L259 Unspecified contact dermatitis, unspecified cause: Secondary | ICD-10-CM

## 2017-06-14 DIAGNOSIS — R932 Abnormal findings on diagnostic imaging of liver and biliary tract: Secondary | ICD-10-CM

## 2017-06-14 DIAGNOSIS — Z87898 Personal history of other specified conditions: Secondary | ICD-10-CM | POA: Diagnosis not present

## 2017-06-14 NOTE — Progress Notes (Signed)
[name] is a 76 y.o. male is here for follow up.  I acted as a Education administrator for [employer], PA-C Anselmo Pickler, LPN  History of Present Illness:   Chief Complaint  Patient presents with  . Follow-up    HPI   Nodular contours of the liver -- on 03/03/17 patient had a chest CT that showed "Within the upper abdomen the contours of the liver are somewhat nodular." I briefly spoke with Dr. Silvano Rusk rom GI prior to this appointment about this finding and he compared prior 2014 abdominal/pelvis CT findings and reports that the liver appeared similarly. He has had normal LFT's on 03/01/17. Drinks 1-2 cocktails or glass of wine max per week. Healthy diet.  Dizziness -- has completely resolved. Was asymptomatic when he went to the ENT. Denies further concerns or issues.   Contact dermatitis -- was seen on 3 occasions for contact dermatitis. Thinks that he was somehow exposed to poison ivy by his dog. He was initially treated with clobetasol, then received an injection of steroid, but later developed rebound dermatitis from the injection and is now completing an oral steroid. He has almost a week left of this.  There are no preventive care reminders to display for this patient.  Past Medical History:  Diagnosis Date  . Abdominal wall defect, acquired   . Blockage of coronary artery of heart (HCC)    30%  . Hearing aid worn   . Insulin resistance   . LV dysfunction    reduction  . Osteoarthritis   . Pulmonary embolism [facility])      Social History   Social History  . Marital status: Married    Spouse name: [name]  . Number of children: [name]  . Years of education: [name]   Occupational History  . 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 Smoking status: Former Smoker    Quit date: 12/21/1985  . Smokeless tobacco: Never Used  . Alcohol use No  . Drug use: No  . Sexual activity: Not on file     Comment: self employed, makes cakes,  married, 4 adult children,    Other Topics Concern  . Not on file   Social History Narrative   Born in [place].     Past Surgical History:  Procedure Laterality Date  . ACNE CYST REMOVAL  2012   Back   . APPENDECTOMY    . CARDIAC CATHETERIZATION    . COLECTOMY  11/2013   [facility], 2nd to diverticulitis  . RT hip replacement    . TONSILLECTOMY      Family History  Problem Relation Age of Onset  . Heart disease Mother        pacemaker    PMHx, SurgHx, SocialHx, FamHx, Medications, and Allergies were reviewed in the Visit Navigator and updated as appropriate.   Patient Active Problem List   Diagnosis Date Noted  . Chronic pain of both knees 05/24/2017  . Cervical spondylosis without myelopathy 03/28/2017  . Pain in both hands 03/28/2017  . Thoracic aortic atherosclerosis ([place]) 03/04/2017  . Osteoarthritis 03/02/2017  . Community acquired pneumonia of left lower lobe of lung ([place]) 03/02/2017  . Vertigo 03/02/2017  . Cardiomyopathy ([place]) 04/30/2015  . Mitral regurgitation 04/30/2015  . CAD (coronary artery disease) 05/21/2014  . Aortic sclerosis 04/09/2014  . History of pulmonary embolism 12/19/2013  . Diverticulitis of colon (without mention of hemorrhage)(562.11) 10/19/2013  .  Diverticulitis of colon 08/26/2013  . Diastolic dysfunction 74/11/8785  . Pulmonary hypertension ([place]) 07/20/2013  . Hypokalemia 03/28/2013  . Paroxysmal atrial fibrillation ([place]) 02/03/2013  . Abnormal stress echo 01/18/2013  . Low back pain 12/05/2012  . Degenerative arthritis of left hip 12/05/2012  . Hard of hearing 08/28/2011  . TACHYCARDIA 07/23/2010  . ABNORMAL FINDINGS, ELEVATED BP W/O HTN 03/14/2007  . HYPERCHOLESTEROLEMIA 12/20/2006  . Hypothyroidism 11/09/2006    Social History  Substance Use Topics  . Smoking status: Former Smoker    Quit date: 12/21/1985  . Smokeless tobacco: Never Used  . Alcohol use No    Current Medications and Allergies:    Current Outpatient  Prescriptions:  .  apixaban (ELIQUIS) 5 MG TABS tablet, Take 5 mg by mouth 2 (two) times daily. , Disp: , Rfl:  .  cetirizine (ZYRTEC) 10 MG tablet, Take 1 tablet (10 mg total) by mouth daily., Disp: 30 tablet, Rfl: 0 .  Cholecalciferol 50000 units TABS, Take 1 tablet by mouth once a week., Disp: 12 tablet, Rfl: 0 .  levothyroxine (SYNTHROID, LEVOTHROID) 75 MCG tablet, Take 1 tablet (75 mcg total) by mouth daily before breakfast. NEED FOLLOW UP APPOINTMENT FOR MORE REFILLS, Disp: 30 tablet, Rfl: 5 .  meclizine (ANTIVERT) 25 MG tablet, Take 25 mg by mouth as needed. , Disp: , Rfl:  .  meloxicam (MOBIC) 15 MG tablet, TAKE ONE TABLET BY MOUTH ONCE DAILY, Disp: 90 tablet, Rfl: 1 .  metoprolol (LOPRESSOR) 50 MG tablet, Take 50 mg by mouth 2 (two) times daily. , Disp: , Rfl:  .  Multiple Vitamin (MULTIVITAMIN) tablet, Take 1 tablet by mouth daily.  , Disp: , Rfl:  .  omeprazole (PRILOSEC) 20 MG capsule, Take 20 mg by mouth daily.  , Disp: , Rfl:  .  predniSONE (DELTASONE) 10 MG tablet, Take 5 tablets by mouth daily x 2 days, then 4 tablets x 3 days, then 3 tablets x 3 days, then 2 tablets x 3 days, then 1 tablet x 3 days., Disp: 40 tablet, Rfl: 0 .  simvastatin (ZOCOR) 40 MG tablet, Take 1 tablet (40 mg total) by mouth daily at 6 PM., Disp: 90 tablet, Rfl: 1   Allergies  Allergen Reactions  . Flagyl [Metronidazole] Nausea And Vomiting    States IV form causes to allergy  . Lipitor [Atorvastatin] Other (See Comments)    Myalgia    Review of Systems   Review of Systems  Constitutional: Negative for chills, fever, malaise/fatigue and weight loss.  HENT: Negative for hearing loss and tinnitus.   Skin: Positive for itching and rash.  Neurological: Negative for dizziness and headaches.  Endo/Heme/Allergies: Negative for environmental allergies.      Vitals:   Vitals:   06/14/17 1011  BP: (!) 152/70  Pulse: 71  SpO2: 97%  Weight: 195 lb (88.5 kg)  Height: 5\' 9"  (1.753 m)     Body mass  index is 28.8 kg/m.   Physical Exam:    Physical Exam  Constitutional: He appears well-developed. He is cooperative.  Non-toxic appearance. He does not have a sickly appearance. He does not appear ill. No distress.  HENT:  Right Ear: External ear and ear canal normal. Tympanic membrane is not erythematous, not retracted and not bulging.  Left Ear: Tympanic membrane, external ear and ear canal normal. Tympanic membrane is not erythematous, not retracted and not bulging.  Hearing aids bilaterally  Cardiovascular: Normal rate, regular rhythm, S1 normal, S2 normal, normal heart sounds  and normal pulses.   No LE edema  Pulmonary/Chest: Effort normal and breath sounds normal.  Neurological: He is alert.  Skin:  Resolving contact dermatitis to bilateral lower legs, no evidence of infection. No tenderness to palpation.  Nursing note and vitals reviewed.      Assessment and Plan:    Ovidio was seen today for follow-up.  Diagnoses and all orders for this visit:  Abnormal liver CT Reviewed with Dr. Silvano Rusk. No clinical signs of cirrhosis. Labs normal. No excessive ETOH intake. Will only further work-up if symptoms occur. Patient and wife agreeable to plan.  H/O dizziness Asymptomatic. Resolved. Follow-up if any symptoms return or other concerns.  Contact dermatitis, unspecified contact dermatitis type, unspecified trigger Continues on oral steroid taper. Has had marked improvement with medications. Follow-up if any concerns.   . Reviewed expectations re: course of current medical issues. . Discussed self-management of symptoms. . Outlined signs and symptoms indicating need for more acute intervention. . Patient verbalized understanding and all questions were answered. . See orders for this visit as documented in the electronic medical record. . Patient received an After Visit Summary.  CMA or LPN served as scribe during this visit. History, Physical, and Plan performed by  medical provider. Documentation and orders reviewed and attested to.  [name], PA-C [place], [place] 06/14/2017  Follow-up: No Follow-up on file.

## 2017-06-15 DIAGNOSIS — H25813 Combined forms of age-related cataract, bilateral: Secondary | ICD-10-CM | POA: Diagnosis not present

## 2017-06-15 DIAGNOSIS — R6889 Other general symptoms and signs: Secondary | ICD-10-CM | POA: Diagnosis not present

## 2017-06-15 DIAGNOSIS — Z0100 Encounter for examination of eyes and vision without abnormal findings: Secondary | ICD-10-CM | POA: Diagnosis not present

## 2017-06-21 ENCOUNTER — Ambulatory Visit: Payer: Medicare HMO | Admitting: Sports Medicine

## 2017-06-22 ENCOUNTER — Ambulatory Visit (INDEPENDENT_AMBULATORY_CARE_PROVIDER_SITE_OTHER): Payer: Medicare HMO | Admitting: Sports Medicine

## 2017-06-22 ENCOUNTER — Encounter: Payer: Self-pay | Admitting: Sports Medicine

## 2017-06-22 ENCOUNTER — Ambulatory Visit: Payer: Medicare HMO

## 2017-06-22 VITALS — BP 120/70 | HR 71 | Ht 69.0 in | Wt 194.0 lb

## 2017-06-22 DIAGNOSIS — M47812 Spondylosis without myelopathy or radiculopathy, cervical region: Secondary | ICD-10-CM

## 2017-06-22 DIAGNOSIS — M25562 Pain in left knee: Secondary | ICD-10-CM

## 2017-06-22 DIAGNOSIS — M25561 Pain in right knee: Secondary | ICD-10-CM | POA: Diagnosis not present

## 2017-06-22 DIAGNOSIS — M79642 Pain in left hand: Secondary | ICD-10-CM

## 2017-06-22 DIAGNOSIS — M79641 Pain in right hand: Secondary | ICD-10-CM | POA: Diagnosis not present

## 2017-06-22 DIAGNOSIS — G8929 Other chronic pain: Secondary | ICD-10-CM | POA: Diagnosis not present

## 2017-06-22 MED ORDER — DICLOFENAC SODIUM 1 % TD GEL: TRANSDERMAL | 3 refills | Status: DC

## 2017-06-22 NOTE — Assessment & Plan Note (Signed)
Anterior knee pain bilaterally with focal TTP over the inferior pole of patellar.  Continue with exercising with eccentric phase as well as trial of Voltaren gel directly in this area as well.  If this provides some benefit he can try to wean off the meloxicam as well.

## 2017-06-22 NOTE — Assessment & Plan Note (Signed)
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  Would like to see him wean off of meloxicam if possible and will have him try Voltaren gel.  Activity modifications once again emphasized but is really unable to do this until he plans to retire which may be in the coming years.

## 2017-06-22 NOTE — Assessment & Plan Note (Signed)
Patient does have moderate degree of cervical spondylosis this does not seem to correlate with his hand symptoms. Can consider further intervention of the cervical spine worsening radicular symptoms but will continue to monitor at this time.

## 2017-06-22 NOTE — Progress Notes (Signed)
OFFICE VISIT NOTE [name]. [name], [name] at [facility] [phone]  [name] - 76 y.o. male MRN [id number]  Date of birth: 07-May-1941  Visit Date: 06/22/2017  PCP: [name], PA   Referred by: [name], PA  [address], [place] acting as scribe for Dr. Paulla Petersen.  SUBJECTIVE:   Chief Complaint  Patient presents with  . Follow-up  . Right Hand Pain  . Bilateral Knee Pain   HPI: As below and per problem based documentation when appropriate.   RIGHT HAND PAIN: Reports no improvement in lateral side of RT hand pain since last visit. Steroid injection was performed on 6/4 but has not helped. Taking Meloxicam 15 mg daily with minimal--not working as well as it use too.   BILATERAL KNEE PAIN: This has been a chronic issue that has gradually worsened over time. Walking stairs, knee bends, and exercising triggers sx. No prior knee surgeries.       Review of Systems  Constitutional: Negative for chills, diaphoresis, fever, malaise/fatigue and weight loss.  Eyes: Negative.   Respiratory: Negative.   Cardiovascular: Negative.   Gastrointestinal: Negative.   Genitourinary: Negative.   Musculoskeletal: Positive for joint pain and myalgias. Negative for back pain, falls and neck pain.  Skin: Negative.   Neurological: Negative.  Negative for weakness.  Endo/Heme/Allergies: Negative for environmental allergies and polydipsia. Does not bruise/bleed easily.  Psychiatric/Behavioral: Negative.     Otherwise per HPI.  HISTORY & PERTINENT PRIOR DATA:  No specialty comments available. He reports that he quit smoking about 31 years ago. He has never used smokeless tobacco.   Recent Labs  07/08/16 1148 03/15/17 1141  HGBA1C 5.5  --   LABURIC  --  6.0   Medications & Allergies reviewed per EMR Patient Active Problem List   Diagnosis Date Noted  . Chronic pain of both knees 05/24/2017  . Cervical spondylosis without  myelopathy 03/28/2017  . Pain in both hands 03/28/2017  . Thoracic aortic atherosclerosis ([place]) 03/04/2017  . Osteoarthritis 03/02/2017  . Community acquired pneumonia of left lower lobe of lung ([place]) 03/02/2017  . Vertigo 03/02/2017  . Cardiomyopathy ([place]) 04/30/2015  . Mitral regurgitation 04/30/2015  . CAD (coronary artery disease) 05/21/2014  . Aortic sclerosis 04/09/2014  . History of pulmonary embolism 12/19/2013  . Diverticulitis of colon (without mention of hemorrhage)(562.11) 10/19/2013  . Diverticulitis of colon 08/26/2013  . Diastolic dysfunction 89/21/1941  . Pulmonary hypertension ([place]) 07/20/2013  . Hypokalemia 03/28/2013  . Paroxysmal atrial fibrillation ([place]) 02/03/2013  . Abnormal stress echo 01/18/2013  . Low back pain 12/05/2012  . Degenerative arthritis of left hip 12/05/2012  . Hard of hearing 08/28/2011  . TACHYCARDIA 07/23/2010  . ABNORMAL FINDINGS, ELEVATED BP W/O HTN 03/14/2007  . HYPERCHOLESTEROLEMIA 12/20/2006  . Hypothyroidism 11/09/2006   Past Medical History:  Diagnosis Date  . Abdominal wall defect, acquired   . Blockage of coronary artery of heart (HCC)    30%  . Hearing aid worn   . Insulin resistance   . LV dysfunction    reduction  . Osteoarthritis   . Pulmonary embolism (HCC)    Family History  Problem Relation Age of Onset  . Heart disease Mother        pacemaker   Past Surgical History:  Procedure Laterality Date  . ACNE CYST REMOVAL  2012   Back   . APPENDECTOMY    . CARDIAC CATHETERIZATION    . COLECTOMY  11/2013   [facility], 2nd to diverticulitis  . RT hip replacement    . TONSILLECTOMY     Social History   Occupational History  . 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 Smoking status: Former Smoker    Quit date: 12/21/1985  . Smokeless tobacco: Never Used  . Alcohol use No  . Drug use: No  . Sexual activity: Not on file     Comment: self employed, makes cakes, married, 4  adult children,     OBJECTIVE:  VS:  HT:5\' 9"  (175.3 cm)   WT:194 lb (88 kg)  BMI:28.7    BP:120/70  HR:71bpm  TEMP: ( )  RESP:98 % EXAM: Findings:  WDWN, NAD, Non-toxic appearing Alert & appropriately interactive Not depressed or anxious appearing No increased work of breathing. Pupils are equal. EOM intact without nystagmus No clubbing or cyanosis of the extremities appreciated No significant rashes/lesions/ulcerations overlying the examined area. Radial pulses 2+/4.  No significant generalized UE edema. DP & PT pulses 2+/4.  No significant pretibial edema. No clubbing or cyanosis Sensation intact to light touch in upper and lower extremities.  Bilateral hands: Generalized osteophytic bossing bilateral CMC joints.  It pain with axial loading and circumduction while in a flexed position of the thumb.  Thumb is ligamentously stable.  His grip strength is intact.  Neck: Limited side bending and rotation with well-preserved flexion and extension.  He has no pain with Spurling's compression tests or Lhermitte's compression test.  He does have some pain with brachial plexus squeeze and arm squeeze test but this is minimal.  Bilateral lower extremities overall well aligned he does have a moderate degree of pain across the inferior pole of patella bilaterally with some palpable crepitation with this as well as with flexion and extension/9.  He is ligamentously stable.  Does have very large exostosis across the tibial tubercle consistent with prior [place] right greater than left)     No results found. ASSESSMENT & PLAN:   Problem List Items Addressed This Visit    Cervical spondylosis without myelopathy    Patient does have moderate degree of cervical spondylosis this does not seem to correlate with his hand symptoms. Can consider further intervention of the cervical spine worsening radicular symptoms but will continue to monitor at this time.      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  Would like to see him wean off of meloxicam if possible and will have him try Voltaren gel.  Activity modifications once again emphasized but is really unable to do this until he plans to retire which may be in the coming years.      Chronic pain of both knees    Anterior knee pain bilaterally with focal TTP over the inferior pole of patellar.  Continue with exercising with eccentric phase as well as trial of Voltaren gel directly in this area as well.  If this provides some benefit he can try to wean off the meloxicam as well.       Other Visit Diagnoses    Chronic pain of right knee    -  Primary   Chronic pain of left knee       Right hand pain          Follow-up: Return in about 6 weeks (around 08/03/2017).  CMA/ATC served as Education administrator during this visit. History, Physical, and Plan performed by medical provider. Documentation and orders reviewed and attested to.      Teresa Coombs, [facility] Physician

## 2017-06-30 DIAGNOSIS — D225 Melanocytic nevi of trunk: Secondary | ICD-10-CM | POA: Diagnosis not present

## 2017-06-30 DIAGNOSIS — D485 Neoplasm of uncertain behavior of skin: Secondary | ICD-10-CM | POA: Diagnosis not present

## 2017-06-30 DIAGNOSIS — L821 Other seborrheic keratosis: Secondary | ICD-10-CM | POA: Diagnosis not present

## 2017-06-30 DIAGNOSIS — L570 Actinic keratosis: Secondary | ICD-10-CM | POA: Diagnosis not present

## 2017-06-30 DIAGNOSIS — D235 Other benign neoplasm of skin of trunk: Secondary | ICD-10-CM | POA: Diagnosis not present

## 2017-07-01 ENCOUNTER — Other Ambulatory Visit: Payer: Self-pay | Admitting: Family Medicine

## 2017-07-27 DIAGNOSIS — R6889 Other general symptoms and signs: Secondary | ICD-10-CM | POA: Diagnosis not present

## 2017-08-05 ENCOUNTER — Telehealth: Payer: Self-pay | Admitting: Physician Assistant

## 2017-08-05 ENCOUNTER — Other Ambulatory Visit: Payer: Self-pay | Admitting: Emergency Medicine

## 2017-08-05 MED ORDER — MECLIZINE HCL 25 MG PO TABS: 25.0000 mg | ORAL_TABLET | ORAL | 0 refills | Status: DC | PRN

## 2017-08-05 NOTE — Telephone Encounter (Signed)
Prescription sent to pharmacy. Left message on patients voicemail.

## 2017-08-05 NOTE — Telephone Encounter (Signed)
MEDICATION: meclizine (ANTIVERT) 25 MG tablet  PHARMACY:  [facility] - [place], [place] - [address] [phone] (Phone) [phone] (Fax)   IS THIS A 90 DAY SUPPLY : yes  IS PATIENT OUT OF MEDICTAION: yes  IF NOT; HOW MUCH IS LEFT: n/a  LAST APPOINTMENT DATE: 06/14/17  NEXT APPOINTMENT DATE:03/07/18  OTHER COMMENTS: [facility] has not filled this medication before.    **Let patient know to contact pharmacy at the end of the day to make sure medication is ready. **  ** Please notify patient to allow 48-72 hours to process**  **Encourage patient to contact the pharmacy for refills or they can request refills through [facility]**

## 2017-08-12 DIAGNOSIS — J383 Other diseases of vocal cords: Secondary | ICD-10-CM | POA: Diagnosis not present

## 2017-08-12 DIAGNOSIS — R1310 Dysphagia, unspecified: Secondary | ICD-10-CM | POA: Diagnosis not present

## 2017-08-16 DIAGNOSIS — I493 Ventricular premature depolarization: Secondary | ICD-10-CM | POA: Diagnosis not present

## 2017-08-16 DIAGNOSIS — I428 Other cardiomyopathies: Secondary | ICD-10-CM | POA: Diagnosis not present

## 2017-08-16 DIAGNOSIS — I2510 Atherosclerotic heart disease of native coronary artery without angina pectoris: Secondary | ICD-10-CM | POA: Diagnosis not present

## 2017-08-16 DIAGNOSIS — I340 Nonrheumatic mitral (valve) insufficiency: Secondary | ICD-10-CM | POA: Diagnosis not present

## 2017-08-16 DIAGNOSIS — R42 Dizziness and giddiness: Secondary | ICD-10-CM | POA: Diagnosis not present

## 2017-08-18 DIAGNOSIS — Z1211 Encounter for screening for malignant neoplasm of colon: Secondary | ICD-10-CM | POA: Diagnosis not present

## 2017-08-24 DIAGNOSIS — R6889 Other general symptoms and signs: Secondary | ICD-10-CM | POA: Diagnosis not present

## 2017-08-27 LAB — FECAL OCCULT BLOOD, GUAIAC: Fecal Occult Blood: NEGATIVE

## 2017-08-30 ENCOUNTER — Encounter: Payer: Self-pay | Admitting: Physician Assistant

## 2017-08-30 ENCOUNTER — Ambulatory Visit (INDEPENDENT_AMBULATORY_CARE_PROVIDER_SITE_OTHER): Payer: Medicare HMO | Admitting: Family Medicine

## 2017-08-30 VITALS — BP 120/80 | HR 75 | Temp 98.4°F | Ht 69.0 in | Wt 193.5 lb

## 2017-08-30 DIAGNOSIS — N3090 Cystitis, unspecified without hematuria: Secondary | ICD-10-CM

## 2017-08-30 DIAGNOSIS — R350 Frequency of micturition: Secondary | ICD-10-CM | POA: Diagnosis not present

## 2017-08-30 DIAGNOSIS — R300 Dysuria: Secondary | ICD-10-CM | POA: Diagnosis not present

## 2017-08-30 LAB — CBC WITH DIFFERENTIAL/PLATELET
BASOS PCT: 0.4 % (ref 0.0–3.0)
Basophils Absolute: 0.1 10*3/uL (ref 0.0–0.1)
EOS ABS: 0.1 10*3/uL (ref 0.0–0.7)
Eosinophils Relative: 0.4 % (ref 0.0–5.0)
HEMATOCRIT: 38.4 % — AB (ref 39.0–52.0)
HEMOGLOBIN: 12.7 g/dL — AB (ref 13.0–17.0)
LYMPHS PCT: 9.4 % — AB (ref 12.0–46.0)
Lymphs Abs: 1.5 10*3/uL (ref 0.7–4.0)
MCHC: 33 g/dL (ref 30.0–36.0)
MCV: 88.7 fl (ref 78.0–100.0)
MONO ABS: 1.4 10*3/uL — AB (ref 0.1–1.0)
Monocytes Relative: 8.8 % (ref 3.0–12.0)
Neutro Abs: 12.9 10*3/uL — ABNORMAL HIGH (ref 1.4–7.7)
Neutrophils Relative %: 81 % — ABNORMAL HIGH (ref 43.0–77.0)
Platelets: 193 10*3/uL (ref 150.0–400.0)
RBC: 4.34 Mil/uL (ref 4.22–5.81)
RDW: 14.6 % (ref 11.5–15.5)
WBC: 15.9 10*3/uL — AB (ref 4.0–10.5)

## 2017-08-30 LAB — BASIC METABOLIC PANEL
BUN: 17 mg/dL (ref 6–23)
CALCIUM: 9.2 mg/dL (ref 8.4–10.5)
CO2: 25 meq/L (ref 19–32)
CREATININE: 1.03 mg/dL (ref 0.40–1.50)
Chloride: 103 mEq/L (ref 96–112)
GFR: 74.74 mL/min (ref >60.00)
GLUCOSE: 107 mg/dL — AB (ref 70–99)
Potassium: 4.1 mEq/L (ref 3.5–5.1)
SODIUM: 137 meq/L (ref 135–145)

## 2017-08-30 LAB — POCT URINALYSIS DIPSTICK
Bilirubin, UA: NEGATIVE
GLUCOSE UA: NEGATIVE
Ketones, UA: NEGATIVE
NITRITE UA: POSITIVE
Spec Grav, UA: 1.02 (ref 1.010–1.025)
UROBILINOGEN UA: 1 U/dL
pH, UA: 6 (ref 5.0–8.0)

## 2017-08-30 MED ORDER — CEPHALEXIN 500 MG PO CAPS: 500.0000 mg | ORAL_CAPSULE | Freq: Four times a day (QID) | ORAL | 0 refills | Status: DC

## 2017-08-30 NOTE — Progress Notes (Signed)
Subjective:    Patient ID: [name], male    DOB: February 09, 1941, 76 y.o.   MRN: [id number]  HPI This is a 76 yo male, accompanied by his wife, who presents today with 4 days of dysuria and urinary frequency. He denies fever/chills, back pain, felt a little nauseous yesterday, no vomiting- this has resolved. He had a mild headache and felt achy several days ago- these symptoms have resolved. He has not had any problems with urinary stream. No pelvic pain or pressures. No constipation or diarrhea. Has never had prostatitis. Is followed by urology for "check up." Has had a couple of UTI in past. Is on long term Eliquis therapy..   Currently wearing holter monitor.   Past Medical History:  Diagnosis Date  . Abdominal wall defect, acquired   . Blockage of coronary artery of heart (HCC)    30%  . Hearing aid worn   . Insulin resistance   . LV dysfunction    reduction  . Osteoarthritis   . Pulmonary embolism [facility])    Past Surgical History:  Procedure Laterality Date  . ACNE CYST REMOVAL  2012   Back   . APPENDECTOMY    . CARDIAC CATHETERIZATION    . COLECTOMY  11/2013   [facility], 2nd to diverticulitis  . RT hip replacement    . TONSILLECTOMY     Family History  Problem Relation Age of Onset  . Heart disease Mother        pacemaker   Social History  Substance Use Topics  . Smoking status: Former Smoker    Quit date: 12/21/1985  . Smokeless tobacco: Never Used  . Alcohol use No      Review of Systems Per HPI    Objective:   Physical Exam  Constitutional: He is oriented to person, place, and time. He appears well-developed and well-nourished. No distress.  HENT:  Head: Normocephalic and atraumatic.  Cardiovascular: Normal rate, regular rhythm and normal heart sounds.   Pulmonary/Chest: Effort normal and breath sounds normal.  Abdominal: Soft. Bowel sounds are normal. He exhibits no distension. There is no tenderness. There is no rebound, no guarding and no CVA  tenderness.  Musculoskeletal: He exhibits no edema.  Neurological: He is alert and oriented to person, place, and time.  Skin: Skin is warm and dry. He is not diaphoretic.  Psychiatric: He has a normal mood and affect. His behavior is normal. Judgment and thought content normal.  Vitals reviewed.     BP 120/80 (BP Location: Left Arm, Patient Position: Sitting, Cuff Size: Normal)   Pulse 75   Temp 98.4 F (36.9 C) (Oral)   Ht 5\' 9"  (1.753 m)   Wt 193 lb 8 oz (87.8 kg)   SpO2 96%   BMI 28.57 kg/m  Wt Readings from Last 3 Encounters:  08/30/17 193 lb 8 oz (87.8 kg)  06/22/17 194 lb (88 kg)  06/14/17 195 lb (88.5 kg)       Assessment & Plan:  1. Urinary frequency - POCT urinalysis dipstick - CBC with Differential/Platelet - Basic metabolic panel - Urine Culture  2. Dysuria - POCT urinalysis dipstick - CBC with Differential/Platelet - Basic metabolic panel - Urine Culture  3. Cystitis - previous culture showed e. Coli, sensitive to cephalosporins. Given co morbid conditions and Eliquis treatment, will start cephalexin while awaiting culture. Patient and wife provided written and verbal instructions and information including RTC precautions.  - CBC with Differential/Platelet - Basic metabolic panel -  cephALEXin (KEFLEX) 500 MG capsule; Take 1 capsule (500 mg total) by mouth 4 (four) times daily.  Dispense: 28 capsule; Refill: 0 - Urine Culture  - noted that he is on chronic, daily meloxicam, discussed potential for increased bleeding and encouraged him to try to at least decrease to 1/2 tablet  Clarene Reamer, FNP-BC  [facility] [place]  08/30/2017 11:10 AM

## 2017-08-30 NOTE — Addendum Note (Signed)
Addended by: Clarene Reamer B on: 08/30/2017 04:16 PM   Modules accepted: Orders

## 2017-08-30 NOTE — Patient Instructions (Signed)
Please start antibiotic as soon as possible I will notify you as soon as culture results are available If worsening symptoms- fever, nausea/vomiting, pain, please call the office  Urinary Tract Infection, Adult A urinary tract infection (UTI) is an infection of any part of the urinary tract, which includes the kidneys, ureters, bladder, and urethra. These organs make, store, and get rid of urine in the body. UTI can be a bladder infection (cystitis) or kidney infection (pyelonephritis). What are the causes? This infection may be caused by fungi, viruses, or bacteria. Bacteria are the most common cause of UTIs. This condition can also be caused by repeated incomplete emptying of the bladder during urination. What increases the risk? This condition is more likely to develop if:  You ignore your need to urinate or hold urine for long periods of time.  You do not empty your bladder completely during urination.  You wipe back to front after urinating or having a bowel movement, if you are male.  You are uncircumcised, if you are male.  You are constipated.  You have a urinary catheter that stays in place (indwelling).  You have a weak defense (immune) system.  You have a medical condition that affects your bowels, kidneys, or bladder.  You have diabetes.  You take antibiotic medicines frequently or for long periods of time, and the antibiotics no longer work well against certain types of infections (antibiotic resistance).  You take medicines that irritate your urinary tract.  You are exposed to chemicals that irritate your urinary tract.  You are male.  What are the signs or symptoms? Symptoms of this condition include:  Fever.  Frequent urination or passing small amounts of urine frequently.  Needing to urinate urgently.  Pain or burning with urination.  Urine that smells bad or unusual.  Cloudy urine.  Pain in the lower abdomen or back.  Trouble  urinating.  Blood in the urine.  Vomiting or being less hungry than normal.  Diarrhea or abdominal pain.  Vaginal discharge, if you are male.  How is this diagnosed? This condition is diagnosed with a medical history and physical exam. You will also need to provide a urine sample to test your urine. Other tests may be done, including:  Blood tests.  Sexually transmitted disease (STD) testing.  If you have had more than one UTI, a cystoscopy or imaging studies may be done to determine the cause of the infections. How is this treated? Treatment for this condition often includes a combination of two or more of the following:  Antibiotic medicine.  Other medicines to treat less common causes of UTI.  Over-the-counter medicines to treat pain.  Drinking enough water to stay hydrated.  Follow these instructions at home:  Take over-the-counter and prescription medicines only as told by your health care provider.  If you were prescribed an antibiotic, take it as told by your health care provider. Do not stop taking the antibiotic even if you start to feel better.  Avoid alcohol, caffeine, tea, and carbonated beverages. They can irritate your bladder.  Drink enough fluid to keep your urine clear or pale yellow.  Keep all follow-up visits as told by your health care provider. This is important.  Make sure to: ? Empty your bladder often and completely. Do not hold urine for long periods of time. ? Empty your bladder before and after sex. ? Wipe from front to back after a bowel movement if you are male. Use each tissue one time when  you wipe. Contact a health care provider if:  You have back pain.  You have a fever.  You feel nauseous or vomit.  Your symptoms do not get better after 3 days.  Your symptoms go away and then return. Get help right away if:  You have severe back pain or lower abdominal pain.  You are vomiting and cannot keep down any medicines or  water. This information is not intended to replace advice given to you by your health care provider. Make sure you discuss any questions you have with your health care provider. Document Released: 09/16/2005 Document Revised: 05/20/2016 Document Reviewed: 10/28/2015 Elsevier Interactive Patient Education  2017 [employer].

## 2017-08-30 NOTE — Progress Notes (Signed)
[name] is a 76 y.o. male here for urinary frequency and dysuria.  I acted as a Education administrator for [employer], PA-C Anselmo Pickler, LPN  History of Present Illness:   Chief Complaint  Patient presents with  . Urinary Frequency  . Dysuria    Urinary Frequency   This is a new problem. Episode onset: symptoms started Friday night. The problem occurs every urination. The problem has been gradually worsening. The quality of the pain is described as burning and aching. The pain is at a severity of 6/10. The pain is moderate. There has been no fever (had chills off and on). He is not sexually active. There is no history of pyelonephritis. Associated symptoms include chills, frequency, nausea and urgency. Pertinent negatives include no flank pain, hematuria, possible pregnancy or vomiting. He has tried NSAIDs for the symptoms. The treatment provided significant relief. There is no history of kidney stones or recurrent UTIs.  Dysuria   Episode onset: started Friday night. The problem has been gradually worsening. The quality of the pain is described as burning. There has been no fever. He is not sexually active. There is no history of pyelonephritis. Associated symptoms include chills, frequency, nausea and urgency. Pertinent negatives include no flank pain, hematuria, possible pregnancy or vomiting. He has tried NSAIDs for the symptoms. The treatment provided significant relief. There is no history of kidney stones or recurrent UTIs.    Past Medical History:  Diagnosis Date  . Abdominal wall defect, acquired   . Blockage of coronary artery of heart (HCC)    30%  . Hearing aid worn   . Insulin resistance   . LV dysfunction    reduction  . Osteoarthritis   . Pulmonary embolism [facility])      Social History   Social History  . Marital status: Married    Spouse name: [name]  . Number of children: [name]  . Years of education: [name]   Occupational History  . 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 Smoking status: Former Smoker    Quit date: 12/21/1985  . Smokeless tobacco: Never Used  . Alcohol use No  . Drug use: No  . 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 Not on file   Social History Narrative   Born in [place].     Past Surgical History:  Procedure Laterality Date  . ACNE CYST REMOVAL  2012   Back   . APPENDECTOMY    . CARDIAC CATHETERIZATION    . COLECTOMY  11/2013   [facility], 2nd to diverticulitis  . RT hip replacement    . TONSILLECTOMY      Family History  Problem Relation Age of Onset  . Heart disease Mother        pacemaker    Allergies  Allergen Reactions  . Flagyl [Metronidazole] Nausea And Vomiting    States IV form causes to allergy  . Lipitor [Atorvastatin] Other (See Comments)    Myalgia    Current Medications:   Current Outpatient Prescriptions:  .  apixaban (ELIQUIS) 5 MG TABS tablet, Take 5 mg by mouth 2 (two) times daily. , Disp: , Rfl:  .  cetirizine (ZYRTEC) 10 MG tablet, Take 1 tablet (10 mg total) by mouth daily., Disp: 30 tablet, Rfl: 0 .  diclofenac sodium (VOLTAREN) 1 %  GEL, Apply topically to affected area qid, Disp: 100 g, Rfl: 3 .  levothyroxine (SYNTHROID, LEVOTHROID) 75 MCG tablet, Take 1 tablet (75 mcg total) by mouth daily before breakfast. NEED FOLLOW UP APPOINTMENT FOR MORE REFILLS, Disp: 30 tablet, Rfl: 5 .  meclizine (ANTIVERT) 25 MG tablet, Take 1 tablet (25 mg total) by mouth as needed., Disp: 20 tablet, Rfl: 0 .  meloxicam (MOBIC) 15 MG tablet, TAKE ONE TABLET BY MOUTH ONCE DAILY, Disp: 90 tablet, Rfl: 1 .  metoprolol (LOPRESSOR) 50 MG tablet, Take 50 mg by mouth 2 (two) times daily. , Disp: , Rfl:  .  Multiple Vitamin (MULTIVITAMIN) tablet, Take 1 tablet by mouth daily.  , Disp: , Rfl:  .  omeprazole (PRILOSEC) 20 MG capsule, Take 20 mg by mouth daily.  , Disp: , Rfl:  .  simvastatin  (ZOCOR) 40 MG tablet, TAKE ONE TABLET BY MOUTH ONCE DAILY AT  6  PM, Disp: 90 tablet, Rfl: 2   Review of Systems:   Review of Systems  Constitutional: Positive for chills.  Gastrointestinal: Positive for nausea. Negative for vomiting.  Genitourinary: Positive for dysuria, frequency and urgency. Negative for flank pain and hematuria.    Vitals:   Vitals:   08/30/17 1017  BP: 120/80  Pulse: 75  Temp: 98.4 F (36.9 C)  TempSrc: Oral  SpO2: 96%  Weight: 193 lb 8 oz (87.8 kg)  Height: 5\' 9"  (1.753 m)     Body mass index is 28.57 kg/m.  Physical Exam:   Physical Exam  Assessment and Plan:    Parv was seen today for urinary frequency and dysuria.  Diagnoses and all orders for this visit:  Urinary frequency -     POCT urinalysis dipstick  Dysuria -     POCT urinalysis dipstick    . Reviewed expectations re: course of current medical issues. . Discussed self-management of symptoms. . Outlined signs and symptoms indicating need for more acute intervention. . Patient verbalized understanding and all questions were answered. . See orders for this visit as documented in the electronic medical record. . Patient received an After-Visit Summary.  CMA or LPN served as scribe during this visit. History, Physical, and Plan performed by medical provider. Documentation and orders reviewed and attested to.  [name], PA-C

## 2017-08-31 ENCOUNTER — Telehealth: Payer: Self-pay | Admitting: Physician Assistant

## 2017-08-31 NOTE — Telephone Encounter (Signed)
Patient's wife called in reference to missing phone call from our office yesterday. Patient's wife believes this may be about lab results. Please call patient and advise.

## 2017-09-01 NOTE — Telephone Encounter (Signed)
Called and spoke to patient's wife, patient feeling significantly better, tolerating cephalexin well. Discussed preliminary culture results and follow up urine.

## 2017-09-02 LAB — URINE CULTURE
MICRO NUMBER: 80993757
SPECIMEN QUALITY:: ADEQUATE

## 2017-09-03 ENCOUNTER — Other Ambulatory Visit (INDEPENDENT_AMBULATORY_CARE_PROVIDER_SITE_OTHER): Payer: Medicare HMO

## 2017-09-03 DIAGNOSIS — N3090 Cystitis, unspecified without hematuria: Secondary | ICD-10-CM

## 2017-09-03 LAB — POCT URINALYSIS DIPSTICK
Bilirubin, UA: NEGATIVE
Glucose, UA: NEGATIVE
Ketones, UA: NEGATIVE
NITRITE UA: NEGATIVE
PH UA: 6 (ref 5.0–8.0)
PROTEIN UA: NEGATIVE
RBC UA: NEGATIVE
SPEC GRAV UA: 1.015 (ref 1.010–1.025)
UROBILINOGEN UA: 0.2 U/dL

## 2017-09-14 ENCOUNTER — Ambulatory Visit (INDEPENDENT_AMBULATORY_CARE_PROVIDER_SITE_OTHER): Payer: Medicare HMO | Admitting: Physician Assistant

## 2017-09-14 ENCOUNTER — Encounter: Payer: Self-pay | Admitting: Physician Assistant

## 2017-09-14 VITALS — BP 132/70 | HR 66 | Temp 98.4°F | Ht 69.0 in | Wt 193.0 lb

## 2017-09-14 DIAGNOSIS — R350 Frequency of micturition: Secondary | ICD-10-CM | POA: Diagnosis not present

## 2017-09-14 DIAGNOSIS — N3090 Cystitis, unspecified without hematuria: Secondary | ICD-10-CM | POA: Diagnosis not present

## 2017-09-14 DIAGNOSIS — R300 Dysuria: Secondary | ICD-10-CM | POA: Diagnosis not present

## 2017-09-14 LAB — COMPREHENSIVE METABOLIC PANEL
ALT: 16 U/L (ref 0–53)
AST: 18 U/L (ref 0–37)
Albumin: 4.3 g/dL (ref 3.5–5.2)
Alkaline Phosphatase: 42 U/L (ref 39–117)
BILIRUBIN TOTAL: 0.9 mg/dL (ref 0.2–1.2)
BUN: 18 mg/dL (ref 6–23)
CO2: 28 meq/L (ref 19–32)
Calcium: 9.2 mg/dL (ref 8.4–10.5)
Chloride: 106 mEq/L (ref 96–112)
Creatinine, Ser: 0.95 mg/dL (ref 0.40–1.50)
GFR: 82.04 mL/min (ref >60.00)
GLUCOSE: 108 mg/dL — AB (ref 70–99)
POTASSIUM: 4.3 meq/L (ref 3.5–5.1)
SODIUM: 140 meq/L (ref 135–145)
Total Protein: 6.4 g/dL (ref 6.0–8.3)

## 2017-09-14 LAB — CBC WITH DIFFERENTIAL/PLATELET
BASOS ABS: 0.1 10*3/uL (ref 0.0–0.1)
Basophils Relative: 0.5 % (ref 0.0–3.0)
Eosinophils Absolute: 0.1 10*3/uL (ref 0.0–0.7)
Eosinophils Relative: 1.2 % (ref 0.0–5.0)
HCT: 38.5 % — ABNORMAL LOW (ref 39.0–52.0)
Hemoglobin: 12.7 g/dL — ABNORMAL LOW (ref 13.0–17.0)
LYMPHS ABS: 1.3 10*3/uL (ref 0.7–4.0)
Lymphocytes Relative: 10.6 % — ABNORMAL LOW (ref 12.0–46.0)
MCHC: 32.9 g/dL (ref 30.0–36.0)
MCV: 88.4 fl (ref 78.0–100.0)
MONOS PCT: 5.9 % (ref 3.0–12.0)
Monocytes Absolute: 0.7 10*3/uL (ref 0.1–1.0)
NEUTROS ABS: 9.8 10*3/uL — AB (ref 1.4–7.7)
NEUTROS PCT: 81.8 % — AB (ref 43.0–77.0)
PLATELETS: 224 10*3/uL (ref 150.0–400.0)
RBC: 4.35 Mil/uL (ref 4.22–5.81)
RDW: 14.4 % (ref 11.5–15.5)
WBC: 12 10*3/uL — ABNORMAL HIGH (ref 4.0–10.5)

## 2017-09-14 LAB — POCT URINALYSIS DIPSTICK
BILIRUBIN UA: NEGATIVE
Glucose, UA: NEGATIVE
KETONES UA: NEGATIVE
Nitrite, UA: POSITIVE
PH UA: 6 (ref 5.0–8.0)
RBC UA: NEGATIVE
SPEC GRAV UA: 1.02 (ref 1.010–1.025)
Urobilinogen, UA: 1 E.U./dL

## 2017-09-14 MED ORDER — AMOXICILLIN-POT CLAVULANATE 875-125 MG PO TABS: 1.0000 | ORAL_TABLET | Freq: Two times a day (BID) | ORAL | 0 refills | Status: DC

## 2017-09-14 NOTE — Progress Notes (Signed)
[name] is a 76 y.o. male here for a recurrence of a previously resolved problem.  I acted as a Education administrator for [employer], PA-C Anselmo Pickler, LPN  History of Present Illness:   Chief Complaint  Patient presents with  . Urinary Tract Infection    recurrent symptoms    Urinary Tract Infection   This is a recurrent problem. Episode onset: symptoms started on Friday, just does not feel good. The problem occurs every urination. The problem has been waxing and waning. The quality of the pain is described as aching. The pain is at a severity of 3/10. The pain is mild. There has been no fever. He is not sexually active. There is no history of pyelonephritis. Associated symptoms include frequency and hesitancy. Pertinent negatives include no chills, discharge, flank pain, hematuria, nausea, urgency or vomiting. Associated symptoms comments: Urine is cloudy in color.. He has tried nothing for the symptoms. Improvement on treatment: Pt has not taken anything. His past medical history is significant for kidney stones and recurrent UTIs.   UTI diagnosed on 08/30/17, culture showed E. Coli, was treated with Keflex. WBC was 15.9 at that time. Repeat UA showed resolution of infection. He states that he feels similar to how he did at that time, however he feels like he has "caught it earlier."   Past Medical History:  Diagnosis Date  . Abdominal wall defect, acquired   . Blockage of coronary artery of heart (HCC)    30%  . Hearing aid worn   . Insulin resistance   . LV dysfunction    reduction  . Osteoarthritis   . Pulmonary embolism [facility])      Social History   Social History  . Marital status: Married    Spouse name: [name]  . Number of children: [name]  . Years of education: [name]   Occupational History  . 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 Smoking status: Former Smoker    Quit date: 12/21/1985  . Smokeless tobacco: Never Used  . Alcohol  use No  . Drug use: No  . 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 Not on file   Social History Narrative   Born in [place].     Past Surgical History:  Procedure Laterality Date  . ACNE CYST REMOVAL  2012   Back   . APPENDECTOMY    . CARDIAC CATHETERIZATION    . COLECTOMY  11/2013   [facility], 2nd to diverticulitis  . RT hip replacement    . TONSILLECTOMY      Family History  Problem Relation Age of Onset  . Heart disease Mother        pacemaker    Allergies  Allergen Reactions  . Flagyl [Metronidazole] Nausea And Vomiting    States IV form causes to allergy  . Lipitor [Atorvastatin] Other (See Comments)    Myalgia    Current Medications:   Current Outpatient Prescriptions:  .  apixaban (ELIQUIS) 5 MG TABS tablet, Take 5 mg by mouth 2 (two) times daily. , Disp: , Rfl:  .  cephALEXin (KEFLEX) 500 MG capsule, Take 1 capsule (500 mg total) by mouth 4 (four) times daily., Disp: 28 capsule, Rfl: 0 .  cetirizine (ZYRTEC) 10 MG tablet, Take 1 tablet (10 mg total) by mouth daily., Disp: 30 tablet,  Rfl: 0 .  diclofenac sodium (VOLTAREN) 1 % GEL, Apply topically to affected area qid, Disp: 100 g, Rfl: 3 .  levothyroxine (SYNTHROID, LEVOTHROID) 75 MCG tablet, Take 1 tablet (75 mcg total) by mouth daily before breakfast. NEED FOLLOW UP APPOINTMENT FOR MORE REFILLS, Disp: 30 tablet, Rfl: 5 .  meclizine (ANTIVERT) 25 MG tablet, Take 1 tablet (25 mg total) by mouth as needed., Disp: 20 tablet, Rfl: 0 .  meloxicam (MOBIC) 15 MG tablet, TAKE ONE TABLET BY MOUTH ONCE DAILY, Disp: 90 tablet, Rfl: 1 .  metoprolol (LOPRESSOR) 50 MG tablet, Take 50 mg by mouth 2 (two) times daily. , Disp: , Rfl:  .  Multiple Vitamin (MULTIVITAMIN) tablet, Take 1 tablet by mouth daily.  , Disp: , Rfl:  .  omeprazole (PRILOSEC) 20 MG capsule, Take 20 mg by mouth daily.  , Disp: , Rfl:  .  simvastatin (ZOCOR) 40 MG tablet, TAKE  ONE TABLET BY MOUTH ONCE DAILY AT  6  PM, Disp: 90 tablet, Rfl: 2 .  amoxicillin-clavulanate (AUGMENTIN) 875-125 MG tablet, Take 1 tablet by mouth 2 (two) times daily., Disp: 20 tablet, Rfl: 0   Review of Systems:   Review of Systems  Constitutional: Negative for chills, fever, malaise/fatigue and weight loss.  Respiratory: Negative for cough and sputum production.   Cardiovascular: Negative for chest pain and palpitations.  Gastrointestinal: Negative for nausea and vomiting.  Genitourinary: Positive for frequency and hesitancy. Negative for flank pain, hematuria and urgency.  Neurological: Negative for dizziness and headaches.    Vitals:   Vitals:   09/14/17 1110  BP: 132/70  Pulse: 66  Temp: 98.4 F (36.9 C)  TempSrc: Oral  SpO2: 97%  Weight: 193 lb (87.5 kg)  Height: 5\' 9"  (1.753 m)     Body mass index is 28.5 kg/m.  Physical Exam:   Physical Exam  Constitutional: He appears well-developed. He is cooperative.  Non-toxic appearance. He does not have a sickly appearance. He does not appear ill. No distress.  Cardiovascular: Normal rate, regular rhythm, S1 normal, S2 normal, normal heart sounds and normal pulses.   No LE edema  Pulmonary/Chest: Effort normal and breath sounds normal.  Abdominal: Normal appearance and bowel sounds are normal. There is no tenderness. There is no rigidity, no rebound, no guarding and no CVA tenderness.  Neurological: He is alert. GCS eye subscore is 4. GCS verbal subscore is 5. GCS motor subscore is 6.  Skin: Skin is warm, dry and intact.  Psychiatric: He has a normal mood and affect. His speech is normal and behavior is normal.  Nursing note and vitals reviewed.   Results for orders placed or performed in visit on 09/14/17  POCT urinalysis dipstick  Result Value Ref Range   Color, UA Yellow    Clarity, UA Cloudy    Glucose, UA Negative    Bilirubin, UA Negative    Ketones, UA Negative    Spec Grav, UA 1.020 1.010 - 1.025   Blood, UA  Negative    pH, UA 6.0 5.0 - 8.0   Protein, UA 15 mg/dL    Urobilinogen, UA 1.0 0.2 or 1.0 E.U./dL   Nitrite, UA Positive    Leukocytes, UA Large (3+) (A) Negative    Assessment and Plan:    [name] was seen today for urinary tract infection.  Diagnoses and all orders for this visit:   Cystitis Urinalysis consistent with infection. Will base antibiotic treatment off of previous urine culture. Treat with Augmentin per  orders. Will also check routine blood work. Return after course of antibiotics are completed for repeat UA. I also discussed with him that if he were to develop another one soon, I would like for him to see his urologist to discuss this. Reviewed warning signs and red flags. -     POCT urinalysis dipstick -     Urine Culture -     CBC with Differential/Platelet -     Comprehensive metabolic panel -     POCT urinalysis dipstick; Future   Other orders -     amoxicillin-clavulanate (AUGMENTIN) 875-125 MG tablet; Take 1 tablet by mouth 2 (two) times daily.   . Reviewed expectations re: course of current medical issues. . Discussed self-management of symptoms. . Outlined signs and symptoms indicating need for more acute intervention. . Patient verbalized understanding and all questions were answered. . See orders for this visit as documented in the electronic medical record. . Patient received an After-Visit Summary.  CMA or LPN served as scribe during this visit. History, Physical, and Plan performed by medical provider. Documentation and orders reviewed and attested to.  [name], PA-C

## 2017-09-14 NOTE — Patient Instructions (Signed)
Start the Augmentin antibiotic, this is a 10-day course.  Drink plenty of water.  Please schedule an appointment for the lab after you have completed your antibiotic so we can re-check your urine. Also, we can do a flu shot at that time -- you will need to make a nurse visit for this.   Urinary Tract Infection, Adult A urinary tract infection (UTI) is an infection of any part of the urinary tract, which includes the kidneys, ureters, bladder, and urethra. These organs make, store, and get rid of urine in the body. UTI can be a bladder infection (cystitis) or kidney infection (pyelonephritis). What are the causes? This infection may be caused by fungi, viruses, or bacteria. Bacteria are the most common cause of UTIs. This condition can also be caused by repeated incomplete emptying of the bladder during urination. What increases the risk? This condition is more likely to develop if:  You ignore your need to urinate or hold urine for long periods of time.  You do not empty your bladder completely during urination.  You wipe back to front after urinating or having a bowel movement, if you are male.  You are uncircumcised, if you are male.  You are constipated.  You have a urinary catheter that stays in place (indwelling).  You have a weak defense (immune) system.  You have a medical condition that affects your bowels, kidneys, or bladder.  You have diabetes.  You take antibiotic medicines frequently or for long periods of time, and the antibiotics no longer work well against certain types of infections (antibiotic resistance).  You take medicines that irritate your urinary tract.  You are exposed to chemicals that irritate your urinary tract.  You are male.  What are the signs or symptoms? Symptoms of this condition include:  Fever.  Frequent urination or passing small amounts of urine frequently.  Needing to urinate urgently.  Pain or burning with  urination.  Urine that smells bad or unusual.  Cloudy urine.  Pain in the lower abdomen or back.  Trouble urinating.  Blood in the urine.  Vomiting or being less hungry than normal.  Diarrhea or abdominal pain.  Vaginal discharge, if you are male.  How is this diagnosed? This condition is diagnosed with a medical history and physical exam. You will also need to provide a urine sample to test your urine. Other tests may be done, including:  Blood tests.  Sexually transmitted disease (STD) testing.  If you have had more than one UTI, a cystoscopy or imaging studies may be done to determine the cause of the infections. How is this treated? Treatment for this condition often includes a combination of two or more of the following:  Antibiotic medicine.  Other medicines to treat less common causes of UTI.  Over-the-counter medicines to treat pain.  Drinking enough water to stay hydrated.  Follow these instructions at home:  Take over-the-counter and prescription medicines only as told by your health care provider.  If you were prescribed an antibiotic, take it as told by your health care provider. Do not stop taking the antibiotic even if you start to feel better.  Avoid alcohol, caffeine, tea, and carbonated beverages. They can irritate your bladder.  Drink enough fluid to keep your urine clear or pale yellow.  Keep all follow-up visits as told by your health care provider. This is important.  Make sure to: ? Empty your bladder often and completely. Do not hold urine for long periods of time. ?  Empty your bladder before and after sex. ? Wipe from front to back after a bowel movement if you are male. Use each tissue one time when you wipe. Contact a health care provider if:  You have back pain.  You have a fever.  You feel nauseous or vomit.  Your symptoms do not get better after 3 days.  Your symptoms go away and then return. Get help right away  if:  You have severe back pain or lower abdominal pain.  You are vomiting and cannot keep down any medicines or water. This information is not intended to replace advice given to you by your health care provider. Make sure you discuss any questions you have with your health care provider. Document Released: 09/16/2005 Document Revised: 05/20/2016 Document Reviewed: 10/28/2015 Elsevier Interactive Patient Education  2017 [employer].

## 2017-09-16 DIAGNOSIS — R42 Dizziness and giddiness: Secondary | ICD-10-CM | POA: Diagnosis not present

## 2017-09-17 ENCOUNTER — Encounter: Payer: Self-pay | Admitting: Family Medicine

## 2017-09-17 LAB — URINE CULTURE
MICRO NUMBER:: 81060888
SPECIMEN QUALITY:: ADEQUATE

## 2017-09-21 ENCOUNTER — Ambulatory Visit: Payer: Medicare HMO | Admitting: Physician Assistant

## 2017-09-21 ENCOUNTER — Other Ambulatory Visit: Payer: Self-pay | Admitting: Physician Assistant

## 2017-09-27 ENCOUNTER — Other Ambulatory Visit (INDEPENDENT_AMBULATORY_CARE_PROVIDER_SITE_OTHER): Payer: Medicare HMO

## 2017-09-27 DIAGNOSIS — N3090 Cystitis, unspecified without hematuria: Secondary | ICD-10-CM

## 2017-09-27 LAB — POCT URINALYSIS DIPSTICK
Spec Grav, UA: 1.015 (ref 1.010–1.025)
Urobilinogen, UA: 0.2 E.U./dL
pH, UA: 6 (ref 5.0–8.0)

## 2017-10-11 DIAGNOSIS — Z87440 Personal history of urinary (tract) infections: Secondary | ICD-10-CM | POA: Diagnosis not present

## 2017-10-11 DIAGNOSIS — I2510 Atherosclerotic heart disease of native coronary artery without angina pectoris: Secondary | ICD-10-CM | POA: Diagnosis not present

## 2017-10-11 DIAGNOSIS — N400 Enlarged prostate without lower urinary tract symptoms: Secondary | ICD-10-CM | POA: Diagnosis not present

## 2017-10-12 DIAGNOSIS — R6889 Other general symptoms and signs: Secondary | ICD-10-CM | POA: Diagnosis not present

## 2017-10-19 DIAGNOSIS — M5136 Other intervertebral disc degeneration, lumbar region: Secondary | ICD-10-CM | POA: Diagnosis not present

## 2017-10-19 DIAGNOSIS — M4726 Other spondylosis with radiculopathy, lumbar region: Secondary | ICD-10-CM | POA: Diagnosis not present

## 2017-10-19 DIAGNOSIS — M48062 Spinal stenosis, lumbar region with neurogenic claudication: Secondary | ICD-10-CM | POA: Diagnosis not present

## 2017-10-19 DIAGNOSIS — M25552 Pain in left hip: Secondary | ICD-10-CM | POA: Diagnosis not present

## 2017-11-09 ENCOUNTER — Telehealth: Payer: Self-pay | Admitting: Physician Assistant

## 2017-11-09 DIAGNOSIS — R6889 Other general symptoms and signs: Secondary | ICD-10-CM | POA: Diagnosis not present

## 2017-11-09 MED ORDER — MECLIZINE HCL 25 MG PO TABS: 25.0000 mg | ORAL_TABLET | ORAL | 0 refills | Status: DC | PRN

## 2017-11-09 NOTE — Telephone Encounter (Signed)
Spoke to pt's wife Edmonia Lynch, told her Rx for Meclizine was sent to pharmacy and if vertigo/dizziness continues to please make an appointment. Jeannie verbalized understanding and will let pt know.

## 2017-11-09 NOTE — Telephone Encounter (Signed)
I'll take this - Okay to send in Rx. Tell patient to follow up if vertigo continues.

## 2017-11-09 NOTE — Telephone Encounter (Signed)
MEDICATION: Meclizine 25mg   PHARMACY:  [id number] [address], [place]  IS THIS A 90 DAY SUPPLY : no  IS PATIENT OUT OF MEDICATION: no  IF NOT; HOW MUCH IS LEFT: 3 pills  LAST APPOINTMENT DATE: @10 /01/2017  NEXT APPOINTMENT DATE:@3 /18/2019  OTHER COMMENTS: Would like a 30 day supply.   **Let patient know to contact pharmacy at the end of the day to make sure medication is ready. **  ** Please notify patient to allow 48-72 hours to process**  **Encourage patient to contact the pharmacy for refills or they can request refills through [facility]**

## 2017-11-16 DIAGNOSIS — I428 Other cardiomyopathies: Secondary | ICD-10-CM | POA: Diagnosis not present

## 2017-11-16 DIAGNOSIS — I493 Ventricular premature depolarization: Secondary | ICD-10-CM | POA: Diagnosis not present

## 2017-11-16 DIAGNOSIS — I340 Nonrheumatic mitral (valve) insufficiency: Secondary | ICD-10-CM | POA: Diagnosis not present

## 2017-11-16 DIAGNOSIS — I2510 Atherosclerotic heart disease of native coronary artery without angina pectoris: Secondary | ICD-10-CM | POA: Diagnosis not present

## 2017-11-18 DIAGNOSIS — I4891 Unspecified atrial fibrillation: Secondary | ICD-10-CM | POA: Diagnosis not present

## 2017-11-18 DIAGNOSIS — M48061 Spinal stenosis, lumbar region without neurogenic claudication: Secondary | ICD-10-CM | POA: Diagnosis not present

## 2017-11-18 DIAGNOSIS — Z7901 Long term (current) use of anticoagulants: Secondary | ICD-10-CM | POA: Insufficient documentation

## 2017-11-18 DIAGNOSIS — M5416 Radiculopathy, lumbar region: Secondary | ICD-10-CM | POA: Diagnosis not present

## 2017-11-18 DIAGNOSIS — M5136 Other intervertebral disc degeneration, lumbar region: Secondary | ICD-10-CM | POA: Diagnosis not present

## 2017-12-07 DIAGNOSIS — R6889 Other general symptoms and signs: Secondary | ICD-10-CM | POA: Diagnosis not present

## 2017-12-21 ENCOUNTER — Other Ambulatory Visit: Payer: Self-pay | Admitting: Physician Assistant

## 2017-12-22 ENCOUNTER — Other Ambulatory Visit: Payer: Self-pay | Admitting: Physician Assistant

## 2018-01-04 DIAGNOSIS — R6889 Other general symptoms and signs: Secondary | ICD-10-CM | POA: Diagnosis not present

## 2018-01-12 ENCOUNTER — Other Ambulatory Visit: Payer: Self-pay | Admitting: *Deleted

## 2018-01-12 DIAGNOSIS — M1612 Unilateral primary osteoarthritis, left hip: Secondary | ICD-10-CM

## 2018-01-12 MED ORDER — MELOXICAM 15 MG PO TABS: 15.0000 mg | ORAL_TABLET | Freq: Every day | ORAL | 1 refills | Status: DC

## 2018-02-01 DIAGNOSIS — R6889 Other general symptoms and signs: Secondary | ICD-10-CM | POA: Diagnosis not present

## 2018-02-09 DIAGNOSIS — L570 Actinic keratosis: Secondary | ICD-10-CM | POA: Diagnosis not present

## 2018-02-09 DIAGNOSIS — L853 Xerosis cutis: Secondary | ICD-10-CM | POA: Diagnosis not present

## 2018-02-09 DIAGNOSIS — L309 Dermatitis, unspecified: Secondary | ICD-10-CM | POA: Diagnosis not present

## 2018-02-21 DIAGNOSIS — H547 Unspecified visual loss: Secondary | ICD-10-CM | POA: Diagnosis not present

## 2018-02-21 DIAGNOSIS — I509 Heart failure, unspecified: Secondary | ICD-10-CM | POA: Diagnosis not present

## 2018-02-21 DIAGNOSIS — Z96642 Presence of left artificial hip joint: Secondary | ICD-10-CM | POA: Diagnosis not present

## 2018-02-21 DIAGNOSIS — Z7901 Long term (current) use of anticoagulants: Secondary | ICD-10-CM | POA: Diagnosis not present

## 2018-02-21 DIAGNOSIS — M1990 Unspecified osteoarthritis, unspecified site: Secondary | ICD-10-CM | POA: Diagnosis not present

## 2018-02-21 DIAGNOSIS — I110 Hypertensive heart disease with heart failure: Secondary | ICD-10-CM | POA: Diagnosis not present

## 2018-02-21 DIAGNOSIS — K08409 Partial loss of teeth, unspecified cause, unspecified class: Secondary | ICD-10-CM | POA: Diagnosis not present

## 2018-02-21 DIAGNOSIS — I4891 Unspecified atrial fibrillation: Secondary | ICD-10-CM | POA: Diagnosis not present

## 2018-02-21 DIAGNOSIS — Z96641 Presence of right artificial hip joint: Secondary | ICD-10-CM | POA: Diagnosis not present

## 2018-02-21 DIAGNOSIS — E785 Hyperlipidemia, unspecified: Secondary | ICD-10-CM | POA: Diagnosis not present

## 2018-02-21 DIAGNOSIS — E039 Hypothyroidism, unspecified: Secondary | ICD-10-CM | POA: Diagnosis not present

## 2018-02-21 DIAGNOSIS — R52 Pain, unspecified: Secondary | ICD-10-CM | POA: Diagnosis not present

## 2018-02-21 DIAGNOSIS — G8929 Other chronic pain: Secondary | ICD-10-CM | POA: Diagnosis not present

## 2018-03-01 DIAGNOSIS — R6889 Other general symptoms and signs: Secondary | ICD-10-CM | POA: Diagnosis not present

## 2018-03-02 DIAGNOSIS — R69 Illness, unspecified: Secondary | ICD-10-CM | POA: Diagnosis not present

## 2018-03-07 ENCOUNTER — Encounter: Payer: Self-pay | Admitting: Physician Assistant

## 2018-03-07 ENCOUNTER — Ambulatory Visit (INDEPENDENT_AMBULATORY_CARE_PROVIDER_SITE_OTHER): Payer: Medicare HMO | Admitting: Physician Assistant

## 2018-03-07 VITALS — BP 120/68 | HR 67 | Temp 97.5°F | Ht 70.0 in | Wt 191.6 lb

## 2018-03-07 DIAGNOSIS — Z0000 Encounter for general adult medical examination without abnormal findings: Secondary | ICD-10-CM | POA: Diagnosis not present

## 2018-03-07 DIAGNOSIS — E7800 Pure hypercholesterolemia, unspecified: Secondary | ICD-10-CM | POA: Diagnosis not present

## 2018-03-07 DIAGNOSIS — R42 Dizziness and giddiness: Secondary | ICD-10-CM

## 2018-03-07 DIAGNOSIS — E038 Other specified hypothyroidism: Secondary | ICD-10-CM | POA: Diagnosis not present

## 2018-03-07 DIAGNOSIS — I480 Paroxysmal atrial fibrillation: Secondary | ICD-10-CM

## 2018-03-07 DIAGNOSIS — M159 Polyosteoarthritis, unspecified: Secondary | ICD-10-CM | POA: Diagnosis not present

## 2018-03-07 MED ORDER — MECLIZINE HCL 25 MG PO TABS: 25.0000 mg | ORAL_TABLET | ORAL | 0 refills | Status: DC | PRN

## 2018-03-07 NOTE — Progress Notes (Signed)
I acted as a Education administrator for [employer], PA-C Greg Pickler, LPN  Subjective:    [name] is a 77 y.o. male and is here for a comprehensive physical exam.   HPI  There are no preventive care reminders to display for this patient.  Acute Concerns: None  Chronic Issues: Osteoarthritis - sees [name] and [name]; hx of spinal stenosis and bilateral hip replacement; has seen chiropractor in the past; takes Meloxicam daily LV dysfunction --  has an echo planned with [name] on 04/19/18, denies shortness of breath, leg swelling; most recent was March 2018 and EF was 35-40% Paroxysmal afib --followed by [name]. Was previously on Toprol but changed to Lopressor due to cost. He has started Eliquis in April 2018 and is now compliant with this. Hyperlipidemia - denies any unusual muscle aches, maintained on Zocor 40 mg Hypothyroidism - stable on 75 g of levothyroxine, denies any new or unusual recent symptoms Vertigo -- saw ENT in April 2018 and was told that he likely has BPPV, he has been using meclizine and tolerating well  Health Maintenance: Immunizations -- up to date, declined influenza this year  Colonoscopy -- done 10/13/2013, with recommendation to complete again in 2024  PSA -- would like a referral to urologist for his "PSA and exam" Diet -- eats all types of foods Exercise -- active Weight -- Weight: 191 lb 9.6 oz (86.9 kg)   Depression screen PHQ 2/9 03/07/2018  Decreased Interest 0  Down, Depressed, Hopeless 0  PHQ - 2 Score 0    Other providers/specialists: [name] ([place] has seen for at least 70 years Cardiology -- [name] Orthopedics -- [name] for hx of R hip replacement and [name] -- injections GI - Dr. Ferdinand Petersen for diverticulitis   PMHx, SurgHx, SocialHx, Medications, and Allergies were reviewed in the Visit Navigator and updated as appropriate.   Past Medical History:  Diagnosis Date  .  Abdominal wall defect, acquired   . Blockage of coronary artery of heart (HCC)    30%  . Hearing aid worn   . Insulin resistance   . LV dysfunction    reduction  . Osteoarthritis   . Pulmonary embolism [facility])      Past Surgical History:  Procedure Laterality Date  . ACNE CYST REMOVAL  2012   Back   . APPENDECTOMY    . CARDIAC CATHETERIZATION    . COLECTOMY  11/2013   [facility], 2nd to diverticulitis  . RT hip replacement    . TONSILLECTOMY       Family History  Problem Relation Age of Onset  . Heart disease Mother        pacemaker    Social History   Tobacco Use  . Smoking status: Former Smoker    Last attempt to quit: 12/21/1985    Years since quitting: 32.2  . Smokeless tobacco: Never Used  Substance Use Topics  . Alcohol use: No  . Drug use: No    Review of Systems:   Review of Systems  Constitutional: Negative.  Negative for chills, fever, malaise/fatigue and weight loss.  HENT: Negative.  Negative for hearing loss, sinus pain and sore throat.   Eyes: Negative.  Negative for blurred vision.  Respiratory: Negative.  Negative for cough and shortness of breath.   Cardiovascular: Negative.  Negative for chest pain, palpitations and leg swelling.  Gastrointestinal: Negative.  Negative for abdominal pain, constipation, diarrhea, heartburn, nausea  and vomiting.  Genitourinary: Negative.  Negative for dysuria, frequency and urgency.  Musculoskeletal: Negative.  Negative for back pain, myalgias and neck pain.  Skin: Negative.  Negative for itching and rash.  Neurological: Negative.  Negative for dizziness, tingling, seizures, loss of consciousness and headaches.  Endo/Heme/Allergies: Negative.  Negative for polydipsia.  Psychiatric/Behavioral: Negative.  Negative for depression. The patient is not nervous/anxious.     Objective:    Vitals:   03/07/18 1027  BP: 120/68  Pulse: 67  Temp: (!) 97.5 F (36.4 C)  SpO2: 97%   Body mass index is 27.49  kg/m.  General  Alert, cooperative, no distress, appears stated age  Head:  Normocephalic, without obvious abnormality, atraumatic  Eyes:  PERRL, conjunctiva/corneas clear, EOM's intact, fundi benign, both eyes       Ears:  Normal TM's and external ear canals, both ears  Nose: Nares normal, septum midline, mucosa normal, no drainage or sinus tenderness  Throat: Lips, mucosa, and tongue normal; teeth and gums normal  Neck: Supple, symmetrical, trachea midline, no adenopathy;     thyroid:  No enlargement/tenderness/nodules; no carotid bruit or JVD  Back:   Symmetric, no curvature, ROM normal, no CVA tenderness  Lungs:   Clear to auscultation bilaterally, respirations unlabored  Chest wall:  No tenderness or deformity  Heart:  Regular rate and rhythm, S1 and S2 normal, no murmur, rub or gallop  Abdomen:   Soft, non-tender, bowel sounds active all four quadrants, no masses, no organomegaly  Extremities: Extremities normal, atraumatic, no cyanosis or edema  Prostate : Deferred  Skin: Skin color, texture, turgor normal, no rashes or lesions  Lymph nodes: Cervical, supraclavicular, and axillary nodes normal  Neurologic: CNII-XII grossly intact. Normal strength, sensation and reflexes throughout, except patellar reflexes decreased.    AssessmentPlan:   [name] was seen today for follow-up and annual exam.  Diagnoses and all orders for this visit:  Routine general medical examination at a health care facility Today patient counseled on age appropriate routine health concerns for screening and prevention, each reviewed and up to date or declined. Immunizations reviewed and up to date or declined. Labs ordered and reviewed. Risk factors for depression reviewed and negative. Hearing function and visual acuity are intact. ADLs screened and addressed as needed. Functional ability and level of safety reviewed and appropriate. Education, counseling and referrals performed based on assessed risks  today. Patient provided with a copy of personalized Petersen for preventive services.  Other specified hypothyroidism Compliant with thyroid medication. Will adjust medication depending on lab results. -     TSH -     T4, free  Osteoarthritis of multiple joints, unspecified osteoarthritis type Continue management per [name] and [name], did offer PT today, he will think about this.  HYPERCHOLESTEROLEMIA Re-check. Adjust medication based on lab results. -     Lipid panel -     CBC with Differential/Platelet -     Comprehensive metabolic panel  Vertigo Well controlled. Meclizine prn, recommended sparing use. Consider vestibular therapy if becomes uncontrolled. -     CBC with Differential/Platelet -     Comprehensive metabolic panel  Paroxysmal atrial fibrillation (HCC) Management per [name]. Remains on Eliquis and Lopressor.  Other orders -     meclizine (ANTIVERT) 25 MG tablet; Take 1 tablet (25 mg total) by mouth as needed.   Well Adult Exam: Labs ordered: Yes. Patient counseling was done. See below for items discussed. Discussed the patient's BMI.  The BMI BMI  is in the acceptable range Follow up in 3 months. Colon Cancer: UTD  Patient Counseling: [x]   Nutrition: Stressed importance of moderation in sodium/caffeine intake, saturated fat and cholesterol, caloric balance, sufficient intake of fresh fruits, vegetables, and fiber  [x]   Stressed the importance of regular exercise.   []   Substance Abuse: Discussed cessation/primary prevention of tobacco, alcohol, or other drug use; driving or other dangerous activities under the influence; availability of treatment for abuse.   [x]   Injury prevention: Discussed safety belts, safety helmets, smoke detector, smoking near bedding or upholstery.   []   Sexuality: Discussed sexually transmitted diseases, partner selection, use of condoms, avoidance of unintended pregnancy  and contraceptive alternatives.   [x]   Dental health:  Discussed importance of regular tooth brushing, flossing, and dental visits.  [x]   Health maintenance and immunizations reviewed. Please refer to Health maintenance section.   CMA or LPN served as scribe during this visit. History, Physical, and Petersen performed by medical provider. Documentation and orders reviewed and attested to.  [name], PA-C [place]

## 2018-03-07 NOTE — Patient Instructions (Addendum)
Please return at your convenience for your annual wellness visit.  Please make an appointment with the [facility] draw station for your labs:    You will be contacted about your urology referral.   Health Maintenance, Male A healthy lifestyle and preventive care is important for your health and wellness. Ask your health care provider about what schedule of regular examinations is right for you. What should I know about weight and diet? Eat a Healthy Diet  Eat plenty of vegetables, fruits, whole grains, low-fat dairy products, and lean protein.  Do not eat a lot of foods high in solid fats, added sugars, or salt.  Maintain a Healthy Weight Regular exercise can help you achieve or maintain a healthy weight. You should:  Do at least 150 minutes of exercise each week. The exercise should increase your heart rate and make you sweat (moderate-intensity exercise).  Do strength-training exercises at least twice a week.  Watch Your Levels of Cholesterol and Blood Lipids  Have your blood tested for lipids and cholesterol every 5 years starting at 76 years of age. If you are at high risk for heart disease, you should start having your blood tested when you are 76 years old. You may need to have your cholesterol levels checked more often if: ? Your lipid or cholesterol levels are high. ? You are older than 76 years of age. ? You are at high risk for heart disease.  What should I know about cancer screening? Many types of cancers can be detected early and may often be prevented. Lung Cancer  You should be screened every year for lung cancer if: ? You are a current smoker who has smoked for at least 30 years. ? You are a former smoker who has quit within the past 15 years.  Talk to your health care provider about your screening options, when you should start screening, and how often you should be screened.  Colorectal Cancer  Routine colorectal cancer screening usually begins at 76 years of  age and should be repeated every 5-10 years until you are 76 years old. You may need to be screened more often if early forms of precancerous polyps or small growths are found. Your health care provider may recommend screening at an earlier age if you have risk factors for colon cancer.  Your health care provider may recommend using home test kits to check for hidden blood in the stool.  A small camera at the end of a tube can be used to examine your colon (sigmoidoscopy or colonoscopy). This checks for the earliest forms of colorectal cancer.  Prostate and Testicular Cancer  Depending on your age and overall health, your health care provider may do certain tests to screen for prostate and testicular cancer.  Talk to your health care provider about any symptoms or concerns you have about testicular or prostate cancer.  Skin Cancer  Check your skin from head to toe regularly.  Tell your health care provider about any new moles or changes in moles, especially if: ? There is a change in a mole's size, shape, or color. ? You have a mole that is larger than a pencil eraser.  Always use sunscreen. Apply sunscreen liberally and repeat throughout the day.  Protect yourself by wearing long sleeves, pants, a wide-brimmed hat, and sunglasses when outside.  What should I know about heart disease, diabetes, and high blood pressure?  If you are 75-74 years of age, have your blood pressure checked every 3-5  years. If you are 88 years of age or older, have your blood pressure checked every year. You should have your blood pressure measured twice-once when you are at a hospital or clinic, and once when you are not at a hospital or clinic. Record the average of the two measurements. To check your blood pressure when you are not at a hospital or clinic, you can use: ? An automated blood pressure machine at a pharmacy. ? A home blood pressure monitor.  Talk to your health care provider about your target  blood pressure.  If you are between 89-67 years old, ask your health care provider if you should take aspirin to prevent heart disease.  Have regular diabetes screenings by checking your fasting blood sugar level. ? If you are at a normal weight and have a low risk for diabetes, have this test once every three years after the age of 44. ? If you are overweight and have a high risk for diabetes, consider being tested at a younger age or more often.  A one-time screening for abdominal aortic aneurysm (AAA) by ultrasound is recommended for men aged 53-75 years who are current or former smokers. What should I know about preventing infection? Hepatitis B If you have a higher risk for hepatitis B, you should be screened for this virus. Talk with your health care provider to find out if you are at risk for hepatitis B infection. Hepatitis C Blood testing is recommended for:  Everyone born from [birth year] through 1964.  Anyone with known risk factors for hepatitis C.  Sexually Transmitted Diseases (STDs)  You should be screened each year for STDs including gonorrhea and chlamydia if: ? You are sexually active and are younger than 76 years of age. ? You are older than 76 years of age and your health care provider tells you that you are at risk for this type of infection. ? Your sexual activity has changed since you were last screened and you are at an increased risk for chlamydia or gonorrhea. Ask your health care provider if you are at risk.  Talk with your health care provider about whether you are at high risk of being infected with HIV. Your health care provider may recommend a prescription medicine to help prevent HIV infection.  What else can I do?  Schedule regular health, dental, and eye exams.  Stay current with your vaccines (immunizations).  Do not use any tobacco products, such as cigarettes, chewing tobacco, and e-cigarettes. If you need help quitting, ask your health care  provider.  Limit alcohol intake to no more than 2 drinks per day. One drink equals 12 ounces of beer, 5 ounces of wine, or 1 ounces of hard liquor.  Do not use street drugs.  Do not share needles.  Ask your health care provider for help if you need support or information about quitting drugs.  Tell your health care provider if you often feel depressed.  Tell your health care provider if you have ever been abused or do not feel safe at home. This information is not intended to replace advice given to you by your health care provider. Make sure you discuss any questions you have with your health care provider. Document Released: 06/04/2008 Document Revised: 08/05/2016 Document Reviewed: 09/10/2015 Elsevier Interactive Patient Education  [employer].

## 2018-03-09 DIAGNOSIS — E7800 Pure hypercholesterolemia, unspecified: Secondary | ICD-10-CM | POA: Diagnosis not present

## 2018-03-09 DIAGNOSIS — E038 Other specified hypothyroidism: Secondary | ICD-10-CM | POA: Diagnosis not present

## 2018-03-09 DIAGNOSIS — R42 Dizziness and giddiness: Secondary | ICD-10-CM | POA: Diagnosis not present

## 2018-03-09 LAB — CBC WITH DIFFERENTIAL/PLATELET
Basophils Absolute: 48 cells/uL (ref 0–200)
Basophils Relative: 0.9 %
EOS PCT: 2.5 %
Eosinophils Absolute: 133 cells/uL (ref 15–500)
HEMATOCRIT: 36.8 % — AB (ref 38.5–50.0)
Hemoglobin: 12.5 g/dL — ABNORMAL LOW (ref 13.2–17.1)
LYMPHS ABS: 1468 {cells}/uL (ref 850–3900)
MCH: 29.3 pg (ref 27.0–33.0)
MCHC: 34 g/dL (ref 32.0–36.0)
MCV: 86.2 fL (ref 80.0–100.0)
MONOS PCT: 10.8 %
MPV: 10 fL (ref 7.5–12.5)
NEUTROS ABS: 3079 {cells}/uL (ref 1500–7800)
NEUTROS PCT: 58.1 %
Platelets: 181 10*3/uL (ref 140–400)
RBC: 4.27 10*6/uL (ref 4.20–5.80)
RDW: 13.5 % (ref 11.0–15.0)
Total Lymphocyte: 27.7 %
WBC mixed population: 572 cells/uL (ref 200–950)
WBC: 5.3 10*3/uL (ref 3.8–10.8)

## 2018-03-09 LAB — LIPID PANEL
Cholesterol: 151 mg/dL (ref <200)
HDL: 39 mg/dL — ABNORMAL LOW (ref >40)
LDL Cholesterol (Calc): 93 mg/dL (calc)
NON-HDL CHOLESTEROL (CALC): 112 mg/dL (ref <130)
TRIGLYCERIDES: 98 mg/dL (ref <150)
Total CHOL/HDL Ratio: 3.9 (calc) (ref <5.0)

## 2018-03-09 LAB — COMPREHENSIVE METABOLIC PANEL
AG Ratio: 2.1 (calc) (ref 1.0–2.5)
ALBUMIN MSPROF: 4.2 g/dL (ref 3.6–5.1)
ALT: 11 U/L (ref 9–46)
AST: 20 U/L (ref 10–35)
Alkaline phosphatase (APISO): 39 U/L — ABNORMAL LOW (ref 40–115)
BILIRUBIN TOTAL: 0.7 mg/dL (ref 0.2–1.2)
BUN: 21 mg/dL (ref 7–25)
CALCIUM: 9 mg/dL (ref 8.6–10.3)
CO2: 23 mmol/L (ref 20–32)
CREATININE: 1.02 mg/dL (ref 0.70–1.18)
Chloride: 108 mmol/L (ref 98–110)
GLUCOSE: 103 mg/dL — AB (ref 65–99)
Globulin: 2 g/dL (calc) (ref 1.9–3.7)
POTASSIUM: 4.3 mmol/L (ref 3.5–5.3)
SODIUM: 138 mmol/L (ref 135–146)
TOTAL PROTEIN: 6.2 g/dL (ref 6.1–8.1)

## 2018-03-09 LAB — T4, FREE: Free T4: 1.1 ng/dL (ref 0.8–1.8)

## 2018-03-09 LAB — TSH: TSH: 1.46 mIU/L (ref 0.40–4.50)

## 2018-03-11 ENCOUNTER — Telehealth: Payer: Self-pay | Admitting: Physician Assistant

## 2018-03-11 ENCOUNTER — Other Ambulatory Visit: Payer: Self-pay | Admitting: Physician Assistant

## 2018-03-11 DIAGNOSIS — E7800 Pure hypercholesterolemia, unspecified: Secondary | ICD-10-CM

## 2018-03-11 NOTE — Telephone Encounter (Signed)
Copied from [place] [id number]. Topic: General - Other >> Mar 11, 2018  1:43 PM Carole Binning B wrote: Faythe Ghee for [facility] to give the patient lab results per Butch Penny, LPN at [facility].    Pt returning call Nurse Triage line busy.

## 2018-03-11 NOTE — Telephone Encounter (Signed)
Noted - see result note 

## 2018-03-11 NOTE — Telephone Encounter (Signed)
Labs  Results  And  Recommendations  From  Visit  With  [name]     03/07/2018  Pt  Advised   Recommendations  On changing  Zocor  To  Lipitor  Pt  Declines  Lab  Draw  Scheduled  For 3  Months  Draw  Pt  3  Way   conference  With [place]   Pts  Phone  Number  Is  [phone]

## 2018-03-24 ENCOUNTER — Ambulatory Visit (INDEPENDENT_AMBULATORY_CARE_PROVIDER_SITE_OTHER): Payer: Medicare HMO | Admitting: *Deleted

## 2018-03-24 ENCOUNTER — Encounter: Payer: Self-pay | Admitting: *Deleted

## 2018-03-24 VITALS — BP 138/70 | HR 78 | Ht 70.0 in | Wt 191.5 lb

## 2018-03-24 DIAGNOSIS — Z0000 Encounter for general adult medical examination without abnormal findings: Secondary | ICD-10-CM

## 2018-03-24 DIAGNOSIS — Z87891 Personal history of nicotine dependence: Secondary | ICD-10-CM

## 2018-03-24 NOTE — Patient Instructions (Addendum)
[name] , Thank you for taking time to come for your Medicare Wellness Visit. I appreciate your ongoing commitment to your health goals. Please review the following plan we discussed and let me know if I can assist you in the future.   You will receive a call to schedule your AAA here in [place] at the heart center   Shingrix is a vaccine for the prevention of Shingles in Adults 50 and older. Please check with your benefits regarding applicable copays or out of pocket expenses.  The Shingrix is given in 2 vaccines approx 8 weeks apart. You must receive the 2nd dose prior to 6 months from receipt of the first.     These are the goals we discussed: Goals    . Patient Stated     Stay healthy and engaged in the community        This is a list of the screening recommended for you and due dates:  Health Maintenance  Topic Date Due  . Flu Shot  11/07/2018*  . Tetanus Vaccine  10/25/2019  . Pneumonia vaccines  Completed  *Topic was postponed. The date shown is not the original due date.      Fall Prevention in the Home Falls can cause injuries. They can happen to people of all ages. There are many things you can do to make your home safe and to help prevent falls. What can I do on the outside of my home?  Regularly fix the edges of walkways and driveways and fix any cracks.  Remove anything that might make you trip as you walk through a door, such as a raised step or threshold.  Trim any bushes or trees on the path to your home.  Use bright outdoor lighting.  Clear any walking paths of anything that might make someone trip, such as rocks or tools.  Regularly check to see if handrails are loose or broken. Make sure that both sides of any steps have handrails.  Any raised decks and porches should have guardrails on the edges.  Have any leaves, snow, or ice cleared regularly.  Use sand or salt on walking paths during winter.  Clean up any spills in your garage right  away. This includes oil or grease spills. What can I do in the bathroom?  Use night lights.  Install grab bars by the toilet and in the tub and shower. Do not use towel bars as grab bars.  Use non-skid mats or decals in the tub or shower.  If you need to sit down in the shower, use a plastic, non-slip stool.  Keep the floor dry. Clean up any water that spills on the floor as soon as it happens.  Remove soap buildup in the tub or shower regularly.  Attach bath mats securely with double-sided non-slip rug tape.  Do not have throw rugs and other things on the floor that can make you trip. What can I do in the bedroom?  Use night lights.  Make sure that you have a light by your bed that is easy to reach.  Do not use any sheets or blankets that are too big for your bed. They should not hang down onto the floor.  Have a firm chair that has side arms. You can use this for support while you get dressed.  Do not have throw rugs and other things on the floor that can make you trip. What can I do in the kitchen?  Clean up any  spills right away.  Avoid walking on wet floors.  Keep items that you use a lot in easy-to-reach places.  If you need to reach something above you, use a strong step stool that has a grab bar.  Keep electrical cords out of the way.  Do not use floor polish or wax that makes floors slippery. If you must use wax, use non-skid floor wax.  Do not have throw rugs and other things on the floor that can make you trip. What can I do with my stairs?  Do not leave any items on the stairs.  Make sure that there are handrails on both sides of the stairs and use them. Fix handrails that are broken or loose. Make sure that handrails are as long as the stairways.  Check any carpeting to make sure that it is firmly attached to the stairs. Fix any carpet that is loose or worn.  Avoid having throw rugs at the top or bottom of the stairs. If you do have throw rugs, attach  them to the floor with carpet tape.  Make sure that you have a light switch at the top of the stairs and the bottom of the stairs. If you do not have them, ask someone to add them for you. What else can I do to help prevent falls?  Wear shoes that: ? Do not have high heels. ? Have rubber bottoms. ? Are comfortable and fit you well. ? Are closed at the toe. Do not wear sandals.  If you use a stepladder: ? Make sure that it is fully opened. Do not climb a closed stepladder. ? Make sure that both sides of the stepladder are locked into place. ? Ask someone to hold it for you, if possible.  Clearly mark and make sure that you can see: ? Any grab bars or handrails. ? First and last steps. ? Where the edge of each step is.  Use tools that help you move around (mobility aids) if they are needed. These include: ? Canes. ? Walkers. ? Scooters. ? Crutches.  Turn on the lights when you go into a dark area. Replace any light bulbs as soon as they burn out.  Set up your furniture so you have a clear path. Avoid moving your furniture around.  If any of your floors are uneven, fix them.  If there are any pets around you, be aware of where they are.  Review your medicines with your doctor. Some medicines can make you feel dizzy. This can increase your chance of falling. Ask your doctor what other things that you can do to help prevent falls. This information is not intended to replace advice given to you by your health care provider. Make sure you discuss any questions you have with your health care provider. Document Released: 10/03/2009 Document Revised: 05/14/2016 Document Reviewed: 01/11/2015 Elsevier Interactive Patient Education  2018 [place] Maintenance, Male A healthy lifestyle and preventive care is important for your health and wellness. Ask your health care provider about what schedule of regular examinations is right for you. What should I know about weight and  diet? Eat a Healthy Diet  Eat plenty of vegetables, fruits, whole grains, low-fat dairy products, and lean protein.  Do not eat a lot of foods high in solid fats, added sugars, or salt.  Maintain a Healthy Weight Regular exercise can help you achieve or maintain a healthy weight. You should:  Do at least 150 minutes of exercise  each week. The exercise should increase your heart rate and make you sweat (moderate-intensity exercise).  Do strength-training exercises at least twice a week.  Watch Your Levels of Cholesterol and Blood Lipids  Have your blood tested for lipids and cholesterol every 5 years starting at 76 years of age. If you are at high risk for heart disease, you should start having your blood tested when you are 76 years old. You may need to have your cholesterol levels checked more often if: ? Your lipid or cholesterol levels are high. ? You are older than 76 years of age. ? You are at high risk for heart disease.  What should I know about cancer screening? Many types of cancers can be detected early and may often be prevented. Lung Cancer  You should be screened every year for lung cancer if: ? You are a current smoker who has smoked for at least 30 years. ? You are a former smoker who has quit within the past 15 years.  Talk to your health care provider about your screening options, when you should start screening, and how often you should be screened.  Colorectal Cancer  Routine colorectal cancer screening usually begins at 76 years of age and should be repeated every 5-10 years until you are 76 years old. You may need to be screened more often if early forms of precancerous polyps or small growths are found. Your health care provider may recommend screening at an earlier age if you have risk factors for colon cancer.  Your health care provider may recommend using home test kits to check for hidden blood in the stool.  A small camera at the end of a tube can be  used to examine your colon (sigmoidoscopy or colonoscopy). This checks for the earliest forms of colorectal cancer.  Prostate and Testicular Cancer  Depending on your age and overall health, your health care provider may do certain tests to screen for prostate and testicular cancer.  Talk to your health care provider about any symptoms or concerns you have about testicular or prostate cancer.  Skin Cancer  Check your skin from head to toe regularly.  Tell your health care provider about any new moles or changes in moles, especially if: ? There is a change in a mole's size, shape, or color. ? You have a mole that is larger than a pencil eraser.  Always use sunscreen. Apply sunscreen liberally and repeat throughout the day.  Protect yourself by wearing long sleeves, pants, a wide-brimmed hat, and sunglasses when outside.  What should I know about heart disease, diabetes, and high blood pressure?  If you are 64-49 years of age, have your blood pressure checked every 3-5 years. If you are 18 years of age or older, have your blood pressure checked every year. You should have your blood pressure measured twice-once when you are at a hospital or clinic, and once when you are not at a hospital or clinic. Record the average of the two measurements. To check your blood pressure when you are not at a hospital or clinic, you can use: ? An automated blood pressure machine at a pharmacy. ? A home blood pressure monitor.  Talk to your health care provider about your target blood pressure.  If you are between 26-81 years old, ask your health care provider if you should take aspirin to prevent heart disease.  Have regular diabetes screenings by checking your fasting blood sugar level. ? If you are at a  normal weight and have a low risk for diabetes, have this test once every three years after the age of 9. ? If you are overweight and have a high risk for diabetes, consider being tested at a younger  age or more often.  A one-time screening for abdominal aortic aneurysm (AAA) by ultrasound is recommended for men aged 4-75 years who are current or former smokers. What should I know about preventing infection? Hepatitis B If you have a higher risk for hepatitis B, you should be screened for this virus. Talk with your health care provider to find out if you are at risk for hepatitis B infection. Hepatitis C Blood testing is recommended for:  Everyone born from [birth year] through 1964.  Anyone with known risk factors for hepatitis C.  Sexually Transmitted Diseases (STDs)  You should be screened each year for STDs including gonorrhea and chlamydia if: ? You are sexually active and are younger than 76 years of age. ? You are older than 76 years of age and your health care provider tells you that you are at risk for this type of infection. ? Your sexual activity has changed since you were last screened and you are at an increased risk for chlamydia or gonorrhea. Ask your health care provider if you are at risk.  Talk with your health care provider about whether you are at high risk of being infected with HIV. Your health care provider may recommend a prescription medicine to help prevent HIV infection.  What else can I do?  Schedule regular health, dental, and eye exams.  Stay current with your vaccines (immunizations).  Do not use any tobacco products, such as cigarettes, chewing tobacco, and e-cigarettes. If you need help quitting, ask your health care provider.  Limit alcohol intake to no more than 2 drinks per day. One drink equals 12 ounces of beer, 5 ounces of wine, or 1 ounces of hard liquor.  Do not use street drugs.  Do not share needles.  Ask your health care provider for help if you need support or information about quitting drugs.  Tell your health care provider if you often feel depressed.  Tell your health care provider if you have ever been abused or do not feel safe  at home. This information is not intended to replace advice given to you by your health care provider. Make sure you discuss any questions you have with your health care provider. Document Released: 06/04/2008 Document Revised: 08/05/2016 Document Reviewed: 09/10/2015 Elsevier Interactive Patient Education  [employer].

## 2018-03-24 NOTE — Progress Notes (Addendum)
Subjective:   [name] is a 77 y.o. male who presents for Medicare Annual/Subsequent preventive examination.  Reports health as good Positive attitude  Has been here x 40 years  Hip surgery 2016 - doing well  Married 31 years Children 4 as well as grand children   Works - has his own company Makes wedding cakes  May close his business soon  due to [employer] volunteer after that    Diet (insulin resistance)  Hx of colectomy 2014 due to diverticulitis  Chol/hdl 3.9; hdl 39; ldl 93 and trig 98 A1c 5.5  BMI 27.4   Exercise Goes to gym 3 days a week  Some strength training and bike and treadmill   Smoking hx;  Evaluate for AAA 0- did have CT of abd and pelvis 03/2013 [name] decided he would have another   There are no preventive care reminders to display for this patient.  Cardiac Risk Factors include: advanced age (>83men, >7 women);hypertension;male gender;family history of premature cardiovascular diseaseColonoscopy 09/2013 PSA check 06/2016  Zoster in 2013; given information regarding the shingrix     Objective:    Vitals: BP 138/70   Pulse 78   Ht 5\' 10"  (1.778 m)   Wt 191 lb 8 oz (86.9 kg)   SpO2 97%   BMI 27.48 kg/m   Body mass index is 27.48 kg/m.  Advanced Directives 03/24/2018 03/15/2017 03/27/2013  Does Patient Have a Medical Advance Directive? Yes Yes Patient does not have advance directive;Patient would not like information  Type of Advance Directive - [place];Living will -  Copy of [place] in Chart? - No - copy requested -    Tobacco Social History   Tobacco Use  Smoking Status Former Smoker  . Packs/day: 1.00  . Years: 15.00  . Pack years: 15.00  . Last attempt to quit: 12/21/1985  . Years since quitting: 32.2  Smokeless Tobacco Never Used  Tobacco Comment   pack a day      Counseling given: Yes Comment: pack a day    Clinical Intake:     Past Medical History:  Diagnosis Date    . Abdominal wall defect, acquired   . Blockage of coronary artery of heart (HCC)    30%  . Hearing aid worn   . Insulin resistance   . LV dysfunction    reduction  . Osteoarthritis   . Pulmonary embolism [facility])    Past Surgical History:  Procedure Laterality Date  . ACNE CYST REMOVAL  2012   Back   . APPENDECTOMY    . CARDIAC CATHETERIZATION    . COLECTOMY  11/2013   [facility], 2nd to diverticulitis  . RT hip replacement    . TONSILLECTOMY     Family History  Problem Relation Age of Onset  . Heart disease Mother        pacemaker   Social History   Socioeconomic History  . Marital status: Married    Spouse name: Not on file  . Number of children: Not on file  . Years of education: Not on file  . Highest education level: Not on file  Occupational History  . Occupation: Doctor, hospital    Comment: wedding cakes, self imployed  Social Needs  . Financial resource strain: Not on file  . Food insecurity:    Worry: Not on file    Inability: Not on file  . Transportation needs:    Medical: Not on  file    Non-medical: Not on file  Tobacco Use  . Smoking status: Former Smoker    Packs/day: 1.00    Years: 15.00    Pack years: 15.00    Last attempt to quit: 12/21/1985    Years since quitting: 32.2  . Smokeless tobacco: Never Used  . Tobacco comment: pack a day   Substance and Sexual Activity  . Alcohol use: No  . Drug use: No  . 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 Physical activity:    Days per week: Not on file    Minutes per session: Not on file  . Stress: Not on file  Relationships  . Social connections:    Talks on phone: Not on file    Gets together: Not on file    Attends religious service: Not on file    Active member of club or organization: Not on file    Attends meetings of clubs or organizations: Not on file    Relationship status: Not on file  Other Topics Concern  . Not on  file  Social History Narrative   Born in [place].     Outpatient Encounter Medications as of 03/24/2018  Medication Sig  . apixaban (ELIQUIS) 5 MG TABS tablet Take 5 mg by mouth 2 (two) times daily.   [name] levothyroxine (SYNTHROID, LEVOTHROID) 75 MCG tablet TAKE 1 TABLET BY MOUTH ONCE DAILY BEFORE BREAKFAST, NEEDS FOLLOW UP APPOINTMENT FOR MORE REFILLS  . meclizine (ANTIVERT) 25 MG tablet Take 1 tablet (25 mg total) by mouth as needed.  . meloxicam (MOBIC) 15 MG tablet Take 1 tablet (15 mg total) by mouth daily.  . metoprolol (LOPRESSOR) 50 MG tablet Take 50 mg by mouth 2 (two) times daily.   . Multiple Vitamin (MULTIVITAMIN) tablet Take 1 tablet by mouth daily.    [name] omeprazole (PRILOSEC) 20 MG capsule Take 20 mg by mouth daily.    . simvastatin (ZOCOR) 40 MG tablet TAKE ONE TABLET BY MOUTH ONCE DAILY AT  6  PM  . cetirizine (ZYRTEC) 10 MG tablet Take 1 tablet (10 mg total) by mouth daily. (Patient not taking: Reported on 03/24/2018)   No facility-administered encounter medications on file as of 03/24/2018.     Activities of Daily Living In your present state of health, do you have any difficulty performing the following activities: 03/24/2018  Hearing? N  Vision? N  Difficulty concentrating or making decisions? N  Walking or climbing stairs? N  Dressing or bathing? N  Doing errands, shopping? N  Preparing Food and eating ? N  Using the Toilet? N  In the past six months, have you accidently leaked urine? N  Do you have problems with loss of bowel control? N  Comment 2014 bowel surgery  Managing your Medications? N  Managing your Finances? N  Housekeeping or managing your Housekeeping? N  Some recent data might be hidden    Patient Care Team: [name], [place] as PCP - General (Physician Assistant) Ellis Parents, MD as Attending Physician (Cardiology)   Assessment:   This is a routine wellness examination for [name].  Exercise Activities and Dietary recommendations Current  Exercise Habits: Structured exercise class, Type of exercise: walking, Time (Minutes): 60, Frequency (Times/Week): (S) 3(plus walking and standing in the business ), Weekly Exercise (Minutes/Week): 180, Intensity: Moderate  Goals    . Patient Stated     Stay healthy  and engaged in the community        Fall Risk Fall Risk  03/24/2018 03/07/2018 03/15/2017 03/01/2017 07/09/2016  Falls in the past year? No No No No No     Depression Screen PHQ 2/9 Scores 03/24/2018 03/07/2018 03/15/2017 03/01/2017  PHQ - 2 Score 0 0 0 0    Cognitive Function MMSE - Mini Mental State Exam 03/24/2018  Not completed: (No Data)    Ad8 score reviewed for issues:  Issues making decisions:  Less interest in hobbies / activities:  Repeats questions, stories (family complaining):  Trouble using ordinary gadgets (microwave, computer, phone):  Forgets the month or year:   Mismanaging finances:   Remembering appts:  Daily problems with thinking and/or memory: Ad8 score is=0 Grandmother had some memory issues and was well in her 78s Mother had memory issues           Immunization History  Administered Date(s) Administered  . Influenza Split 12/07/2011, 11/24/2012  . Influenza Whole 09/17/2008, 09/20/2009  . Influenza, High Dose Seasonal PF 01/20/2017  . Influenza,inj,Quad PF,6+ Mos 12/24/2014, 09/10/2015  . Pneumococcal Conjugate-13 12/24/2014  . Pneumococcal Polysaccharide-23 10/24/2009  . Td 10/24/2009  . Zoster 04/20/2012      Screening Tests Health Maintenance  Topic Date Due  . INFLUENZA VACCINE  11/07/2018 (Originally 07/21/2018)  . TETANUS/TDAP  10/25/2019  . PNA vac Low Risk Adult  Completed         Plan:      PCP Notes  Health Maintenance Educated regarding Shignrix; has had minor episode of shingles and this is when he rec'd his Zoster in 2013  Otherwise, he is doing well  Exercises 3 days a week but is standing in his work.   Abnormal Screens None    Referrals    [facility] due to smoking history    Patient concerns; Would like a new cardiologist  Is seeing his PA now and he likes her  May discuss at his next visit, cardiologist in [place] system  Is concerned that his last reflex test to knee was unresponsive Noted on Dr. Arrie Eastern note in Nov 2018 "mild sensory deficit in the left L5 as compared to the right"  I have recommended he call Dr. Arrie Eastern with his question for fup or he can make an apt with [name] PA to fup or evaluate He denies any lack of sensation to lower extremity or nerve pain.   Nurse Concerns; As noted   Next PCP apt scheduled for 08/2017       I have personally reviewed and noted the following in the patient's chart:   . Medical and social history . Use of alcohol, tobacco or illicit drugs  . Current medications and supplements . Functional ability and status . Nutritional status . Physical activity . Advanced directives . List of other physicians . Hospitalizations, surgeries, and ER visits in previous 12 months . Vitals . Screenings to include cognitive, depression, and falls . Referrals and appointments  In addition, I have reviewed and discussed with patient certain preventive protocols, quality metrics, and best practice recommendations. A written personalized care plan for preventive services as well as general preventive health recommendations were provided to patient.     LYYTK,PTWSF, RN  03/24/2018  I have personally reviewed the Medicare Annual Wellness questionnaire and have noted 1. The patient's medical and social history 2. Their use of alcohol, tobacco or illicit drugs 3. Their current medications and supplements 4. The patient's functional ability  including ADL's, fall risks, home safety risks and hearing or visual impairment. 5. Diet and physical activities 6. Evidence for depression or mood disorders 7. Reviewed Updated provider list, see scanned forms and [facility]  Snapshot.   The patients weight, height, BMI and visual acuity have been recorded in the chart I have made referrals, counseling and provided education to the patient based review of the above and I have provided the pt with a written personalized care plan for preventive services.  I have provided the patient with a copy of your personalized plan for preventive services. Instructed to take the time to review along with their updated medication list.  [name] PA-C 03/25/18

## 2018-04-04 ENCOUNTER — Other Ambulatory Visit: Payer: Self-pay | Admitting: Physician Assistant

## 2018-04-04 DIAGNOSIS — M1612 Unilateral primary osteoarthritis, left hip: Secondary | ICD-10-CM

## 2018-04-04 NOTE — Telephone Encounter (Signed)
Please see message and advise 

## 2018-04-04 NOTE — Telephone Encounter (Signed)
Spoke to pt, told him Aldona Bar wanted me to let you know there is a significant risk of increased bleeding with Eliquis and Mobic. She wanted to know if you were agreeable to changing to Tylenol. Pt verbalized understanding and said he is aware has tried Tylenol before but no relief and has been on Mobic for 15 years and has tried to cut back but can't. Told him okay just needed you to be aware and Aldona Bar needs to document it and then she will send Rx in. Told pt it will probably be tomorrow. Pt verbalized understanding.

## 2018-04-04 NOTE — Telephone Encounter (Signed)
Samantha see message. 

## 2018-04-04 NOTE — Telephone Encounter (Signed)
Please call patient and let him know that there is a significant risk of increased bleeding with Eliquis and Mobic.  Is he agreeable to changing to Tylenol?  If not, I just need to document that he understands the risk of these two medications, and then I can send it in.  [name] PA-C 04/04/2018

## 2018-04-04 NOTE — Telephone Encounter (Signed)
Copied from [place] [phone]. Topic: Quick Communication - Rx Refill/Question >> Apr 04, 2018  2:01 PM Lennox Solders wrote: Medication: generic mobic Has the patient contacted their pharmacy no . [facility] [address] This med was originally prescribe by dr Madilyn Fireman. Pt would like #[phone] w/refills

## 2018-04-05 ENCOUNTER — Other Ambulatory Visit: Payer: Self-pay | Admitting: Physician Assistant

## 2018-04-05 DIAGNOSIS — M1612 Unilateral primary osteoarthritis, left hip: Secondary | ICD-10-CM

## 2018-04-05 MED ORDER — MELOXICAM 15 MG PO TABS: 15.0000 mg | ORAL_TABLET | Freq: Every day | ORAL | 1 refills | Status: DC

## 2018-04-05 NOTE — Telephone Encounter (Signed)
Patient is requesting Mobic for his pain. He verbalizes understanding of risk of increased bleeding with this medication while he takes Eliquis. He has been counseled on the risks yet would still like to take this medication, as Tylenol is not effective.  Reviewed with supervising physician, Dr. Briscoe Deutscher. Will refill as patient has verbalized understanding of risks. Will continue to counsel in future.  Medication has been sent in.  [name] 04/05/2018

## 2018-04-06 NOTE — Telephone Encounter (Signed)
Called pt and left VM to call the office.  

## 2018-04-26 DIAGNOSIS — R6889 Other general symptoms and signs: Secondary | ICD-10-CM | POA: Diagnosis not present

## 2018-05-05 DIAGNOSIS — G459 Transient cerebral ischemic attack, unspecified: Secondary | ICD-10-CM | POA: Diagnosis not present

## 2018-05-05 DIAGNOSIS — I351 Nonrheumatic aortic (valve) insufficiency: Secondary | ICD-10-CM | POA: Diagnosis not present

## 2018-05-05 DIAGNOSIS — E7800 Pure hypercholesterolemia, unspecified: Secondary | ICD-10-CM | POA: Diagnosis not present

## 2018-05-05 DIAGNOSIS — I340 Nonrheumatic mitral (valve) insufficiency: Secondary | ICD-10-CM | POA: Diagnosis not present

## 2018-05-05 DIAGNOSIS — I2510 Atherosclerotic heart disease of native coronary artery without angina pectoris: Secondary | ICD-10-CM | POA: Diagnosis not present

## 2018-05-05 DIAGNOSIS — I428 Other cardiomyopathies: Secondary | ICD-10-CM | POA: Diagnosis not present

## 2018-05-05 DIAGNOSIS — I480 Paroxysmal atrial fibrillation: Secondary | ICD-10-CM | POA: Diagnosis not present

## 2018-05-20 ENCOUNTER — Encounter: Payer: Self-pay | Admitting: Physician Assistant

## 2018-05-20 ENCOUNTER — Ambulatory Visit (INDEPENDENT_AMBULATORY_CARE_PROVIDER_SITE_OTHER): Payer: Medicare HMO | Admitting: Physician Assistant

## 2018-05-20 VITALS — BP 124/62 | HR 63 | Temp 97.9°F | Ht 70.0 in | Wt 191.0 lb

## 2018-05-20 DIAGNOSIS — B351 Tinea unguium: Secondary | ICD-10-CM

## 2018-05-20 NOTE — Patient Instructions (Signed)
It was great to see you today!  If a referral was placed today, you will be contacted for an appointment. Please note that routine referrals can sometimes take up to 3-4 weeks to process. Please call our office if you haven't heard anything after this time frame.

## 2018-05-20 NOTE — Progress Notes (Signed)
[name] is a 77 y.o. male here for a new problem.  I acted as a Education administrator for [employer], PA-C Anselmo Pickler, LPN  History of Present Illness:   Chief Complaint  Patient presents with  . Left great toe pain    Toe Pain   Incident onset: Pt c/o left great toe pain, has an ingrown toe nail. Noticed last week. The incident occurred at home. There was no injury mechanism. Pain location: Left great toe. The patient is experiencing no pain. He reports no foreign bodies present. Nothing aggravates the symptoms. He has tried nothing (Pt had pedicure 2-3 weeks ago) for the symptoms. Improvement on treatment: Pt would like advice of what to do for ingrown toe nail and who to see.   He has tried several over the counter anti-fungal creams.   Past Medical History:  Diagnosis Date  . Abdominal wall defect, acquired   . Blockage of coronary artery of heart (HCC)    30%  . Hearing aid worn   . Insulin resistance   . LV dysfunction    reduction  . Osteoarthritis   . Pulmonary embolism [facility])      Social History   Socioeconomic History  . Marital status: Married    Spouse name: Not on file  . Number of children: Not on file  . Years of education: Not on file  . Highest education level: Not on file  Occupational History  . Occupation: Doctor, hospital    Comment: wedding cakes, self imployed  Social Needs  . Financial resource strain: Not on file  . Food insecurity:    Worry: Not on file    Inability: Not on file  . Transportation needs:    Medical: Not on file    Non-medical: Not on file  Tobacco Use  . Smoking status: Former Smoker    Packs/day: 1.00    Years: 15.00    Pack years: 15.00    Last attempt to quit: 12/21/1985    Years since quitting: 32.4  . Smokeless tobacco: Never Used  . Tobacco comment: pack a day   Substance and Sexual Activity  . Alcohol use: No  . Drug use: No  . 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 Physical activity:    Days per week: Not on file    Minutes per session: Not on file  . Stress: Not on file  Relationships  . Social connections:    Talks on phone: Not on file    Gets together: Not on file    Attends religious service: Not on file    Active member of club or organization: Not on file    Attends meetings of clubs or organizations: Not on file    Relationship status: Not on file  . Intimate partner violence:    Fear of current or ex partner: Not on file    Emotionally abused: Not on file    Physically abused: Not on file    Forced sexual activity: Not on file  Other Topics Concern  . Not on file  Social History Narrative   Born in [place].     Past Surgical History:  Procedure Laterality Date  . ACNE CYST REMOVAL  2012   Back   . APPENDECTOMY    . CARDIAC CATHETERIZATION    . COLECTOMY  11/2013   [facility], 2nd to diverticulitis  . RT  hip replacement    . TONSILLECTOMY      Family History  Problem Relation Age of Onset  . Heart disease Mother        pacemaker    Allergies  Allergen Reactions  . Flagyl [Metronidazole] Nausea And Vomiting    States IV form causes to allergy  . Lipitor [Atorvastatin] Other (See Comments)    Myalgia    Current Medications:   Current Outpatient Medications:  .  apixaban (ELIQUIS) 5 MG TABS tablet, Take 5 mg by mouth 2 (two) times daily. , Disp: , Rfl:  .  carvedilol (COREG) 6.25 MG tablet, Take 6.25 mg by mouth 2 (two) times daily with a meal. , Disp: , Rfl:  .  levothyroxine (SYNTHROID, LEVOTHROID) 75 MCG tablet, TAKE 1 TABLET BY MOUTH ONCE DAILY BEFORE BREAKFAST, NEEDS FOLLOW UP APPOINTMENT FOR MORE REFILLS, Disp: 90 tablet, Rfl: 1 .  meclizine (ANTIVERT) 25 MG tablet, Take 1 tablet (25 mg total) by mouth as needed., Disp: 20 tablet, Rfl: 0 .  meloxicam (MOBIC) 15 MG tablet, Take 1 tablet (15 mg total) by mouth daily., Disp: 90 tablet, Rfl: 1 .  Multiple Vitamin (MULTIVITAMIN) tablet,  Take 1 tablet by mouth daily.  , Disp: , Rfl:  .  omeprazole (PRILOSEC) 20 MG capsule, Take 20 mg by mouth daily.  , Disp: , Rfl:  .  simvastatin (ZOCOR) 40 MG tablet, TAKE ONE TABLET BY MOUTH ONCE DAILY AT  6  PM, Disp: 90 tablet, Rfl: 2   Review of Systems:   ROS  Negative unless otherwise specified per HPI.   Vitals:   Vitals:   05/20/18 1301  BP: 124/62  Pulse: 63  Temp: 97.9 F (36.6 C)  TempSrc: Oral  SpO2: 97%  Weight: 191 lb (86.6 kg)  Height: 5\' 10"  (1.778 m)     Body mass index is 27.41 kg/m.  Physical Exam:   Physical Exam  Constitutional: He appears well-developed. He is cooperative.  Non-toxic appearance. He does not have a sickly appearance. He does not appear ill. No distress.  Cardiovascular: Normal rate, regular rhythm, S1 normal, S2 normal, normal heart sounds and normal pulses.  No LE edema  Pulmonary/Chest: Effort normal and breath sounds normal.  Neurological: He is alert. GCS eye subscore is 4. GCS verbal subscore is 5. GCS motor subscore is 6.  Skin: Skin is warm, dry and intact.  Thickened, yellow great toe nails on both feet; no induration or fluctuation, no erythema  Psychiatric: He has a normal mood and affect. His speech is normal and behavior is normal.  Nursing note and vitals reviewed.   Assessment and Plan:    Starr was seen today for left great toe pain.  Diagnoses and all orders for this visit:  Onychomycosis of toenail -     Ambulatory referral to Podiatry   We briefly discussed the idea of doing some oral medication, but because of potential side effects and duration of treatment he would like to go to podiatry to investigate other options.  I am in agreement and I have put in a referral for this.  No red flags on exam today.  . Reviewed expectations re: course of current medical issues. . Discussed self-management of symptoms. . Outlined signs and symptoms indicating need for more acute intervention. . Patient verbalized  understanding and all questions were answered. . See orders for this visit as documented in the electronic medical record. . Patient received an After-Visit Summary.  CMA or LPN  served as Education administrator during this visit. History, Physical, and Plan performed by medical provider. Documentation and orders reviewed and attested to.   [name], PA-C

## 2018-05-23 ENCOUNTER — Other Ambulatory Visit: Payer: Self-pay | Admitting: Family Medicine

## 2018-05-23 DIAGNOSIS — M9905 Segmental and somatic dysfunction of pelvic region: Secondary | ICD-10-CM | POA: Diagnosis not present

## 2018-05-23 DIAGNOSIS — M9904 Segmental and somatic dysfunction of sacral region: Secondary | ICD-10-CM | POA: Diagnosis not present

## 2018-05-23 DIAGNOSIS — M9903 Segmental and somatic dysfunction of lumbar region: Secondary | ICD-10-CM | POA: Diagnosis not present

## 2018-05-24 ENCOUNTER — Other Ambulatory Visit: Payer: Self-pay | Admitting: Family Medicine

## 2018-05-25 ENCOUNTER — Other Ambulatory Visit: Payer: Self-pay | Admitting: *Deleted

## 2018-05-25 MED ORDER — SIMVASTATIN 40 MG PO TABS: 40.0000 mg | ORAL_TABLET | Freq: Every day | ORAL | 0 refills | Status: DC

## 2018-05-26 DIAGNOSIS — M9904 Segmental and somatic dysfunction of sacral region: Secondary | ICD-10-CM | POA: Diagnosis not present

## 2018-05-26 DIAGNOSIS — M9905 Segmental and somatic dysfunction of pelvic region: Secondary | ICD-10-CM | POA: Diagnosis not present

## 2018-05-26 DIAGNOSIS — M9903 Segmental and somatic dysfunction of lumbar region: Secondary | ICD-10-CM | POA: Diagnosis not present

## 2018-05-31 DIAGNOSIS — M9904 Segmental and somatic dysfunction of sacral region: Secondary | ICD-10-CM | POA: Diagnosis not present

## 2018-05-31 DIAGNOSIS — M9905 Segmental and somatic dysfunction of pelvic region: Secondary | ICD-10-CM | POA: Diagnosis not present

## 2018-05-31 DIAGNOSIS — M9903 Segmental and somatic dysfunction of lumbar region: Secondary | ICD-10-CM | POA: Diagnosis not present

## 2018-06-02 DIAGNOSIS — M9905 Segmental and somatic dysfunction of pelvic region: Secondary | ICD-10-CM | POA: Diagnosis not present

## 2018-06-02 DIAGNOSIS — M9903 Segmental and somatic dysfunction of lumbar region: Secondary | ICD-10-CM | POA: Diagnosis not present

## 2018-06-02 DIAGNOSIS — M9904 Segmental and somatic dysfunction of sacral region: Secondary | ICD-10-CM | POA: Diagnosis not present

## 2018-06-03 ENCOUNTER — Encounter: Payer: Self-pay | Admitting: Physician Assistant

## 2018-06-06 ENCOUNTER — Encounter: Payer: Self-pay | Admitting: Podiatry

## 2018-06-06 ENCOUNTER — Ambulatory Visit: Payer: Medicare HMO | Admitting: Podiatry

## 2018-06-06 VITALS — BP 152/59 | HR 78 | Ht 70.0 in | Wt 191.0 lb

## 2018-06-06 DIAGNOSIS — B351 Tinea unguium: Secondary | ICD-10-CM | POA: Diagnosis not present

## 2018-06-06 NOTE — Patient Instructions (Signed)
Seen for fungal infected hypertrophic nails. All nails debrided. May use Salsun blue scrub on nails after each shower. Return in 3 months or sooner if needed.

## 2018-06-06 NOTE — Progress Notes (Signed)
SUBJECTIVE: 77 y.o. year old male presents with discolored fungal infected nail x 2 years, on both great toes.  Review of Systems  Constitutional: Negative.   HENT: Negative.   Eyes: Negative.   Respiratory: Negative.   Cardiovascular:       Weak heart with leaking valve.  Gastrointestinal: Negative.   Genitourinary: Negative.   Musculoskeletal:       Arthritic joints, hands, legs, and hips. Both hip replacement done.  Skin: Negative.      OBJECTIVE: DERMATOLOGIC EXAMINATION: Thick white discolored nail plate both great toes.  VASCULAR EXAMINATION OF LOWER LIMBS: Pedal pulses: All pedal pulses are palpable with normal pulsation.  Capillary Filling times within 3 seconds in all digits.  Temperature gradient from tibial crest to dorsum of foot is within normal bilateral.  NEUROLOGIC EXAMINATION OF THE LOWER LIMBS: All epicritic and tactile sensations grossly intact.  MUSCULOSKELETAL EXAMINATION: Normal findings.  ASSESSMENT: Onychomycosis both great toes.  PLAN: Reviewed findings and available treatment options. All nails debrided. Instructed to brush nails and clean well after each shower with antibiotic soap. May return in 3 months.

## 2018-06-07 ENCOUNTER — Ambulatory Visit: Payer: Medicare HMO | Admitting: Physician Assistant

## 2018-06-07 ENCOUNTER — Ambulatory Visit: Payer: Self-pay

## 2018-06-07 DIAGNOSIS — M9904 Segmental and somatic dysfunction of sacral region: Secondary | ICD-10-CM | POA: Diagnosis not present

## 2018-06-07 DIAGNOSIS — M9903 Segmental and somatic dysfunction of lumbar region: Secondary | ICD-10-CM | POA: Diagnosis not present

## 2018-06-07 DIAGNOSIS — M9905 Segmental and somatic dysfunction of pelvic region: Secondary | ICD-10-CM | POA: Diagnosis not present

## 2018-06-07 NOTE — Telephone Encounter (Signed)
Pt. Reports he has been have bright squiggles in his vision x 6 months. Come and go. States his optometrist wants him seen to r/o TIA. Denies any other symptoms. Appointment made for his provider today.   Reason for Disposition . Many floaters in the eye  Answer Assessment - Initial Assessment Questions 1. DESCRIPTION: "What is the vision loss like? Describe it for me." (e.g., complete vision loss, blurred vision, double vision, floaters, etc.)     Bright lines comes and goes 2. LOCATION: "One or both eyes?" If one, ask: "Which eye?"     Both eyes 3. SEVERITY: "Can you see anything?" If so, ask: "What can you see?" (e.g., fine print)     No 4. ONSET: "When did this begin?" "Did it start suddenly or has this been gradual?"     Started  6 months ago 5. PATTERN: "Does this come and go, or has it been constant since it started?"     Comes and goes 6. PAIN: "Is there any pain in your eye(s)?"  (Scale 1-10; or mild, moderate, severe)     No 7. CONTACTS-GLASSES: "Do you wear contacts or glasses?"     Glasses 8. CAUSE: "What do you think is causing this visual problem?"     TIA 9. OTHER SYMPTOMS: "Do you have any other symptoms?" (e.g., confusion, headache, arm or leg weakness, speech problems)     No 10. PREGNANCY: "Is there any chance you are pregnant?" "When was your last menstrual period?"       [name]  Protocols used: [place]

## 2018-06-07 NOTE — Telephone Encounter (Signed)
Spoke to pt, told him need to go to the ED to be evaluated per [name], PA-C due to needing a STAT CT SCAN to rule out TIA per his Optometrist. Pt said "okay I will see another office" and went to hang up. Told pt please do not hang up on me. This could be very serious need to go to ED for evaluation and STAT CT Scan to rule on TIA. Pt verbalized understanding and said okay.

## 2018-06-09 DIAGNOSIS — M9903 Segmental and somatic dysfunction of lumbar region: Secondary | ICD-10-CM | POA: Diagnosis not present

## 2018-06-09 DIAGNOSIS — M9904 Segmental and somatic dysfunction of sacral region: Secondary | ICD-10-CM | POA: Diagnosis not present

## 2018-06-09 DIAGNOSIS — M9905 Segmental and somatic dysfunction of pelvic region: Secondary | ICD-10-CM | POA: Diagnosis not present

## 2018-06-14 ENCOUNTER — Other Ambulatory Visit (INDEPENDENT_AMBULATORY_CARE_PROVIDER_SITE_OTHER): Payer: Medicare HMO

## 2018-06-14 ENCOUNTER — Other Ambulatory Visit: Payer: Medicare HMO

## 2018-06-14 DIAGNOSIS — M9903 Segmental and somatic dysfunction of lumbar region: Secondary | ICD-10-CM | POA: Diagnosis not present

## 2018-06-14 DIAGNOSIS — M9904 Segmental and somatic dysfunction of sacral region: Secondary | ICD-10-CM | POA: Diagnosis not present

## 2018-06-14 DIAGNOSIS — E7800 Pure hypercholesterolemia, unspecified: Secondary | ICD-10-CM | POA: Diagnosis not present

## 2018-06-14 DIAGNOSIS — M9905 Segmental and somatic dysfunction of pelvic region: Secondary | ICD-10-CM | POA: Diagnosis not present

## 2018-06-14 LAB — CBC WITH DIFFERENTIAL/PLATELET
BASOS ABS: 0.1 10*3/uL (ref 0.0–0.1)
Basophils Relative: 1.1 % (ref 0.0–3.0)
EOS ABS: 0.2 10*3/uL (ref 0.0–0.7)
Eosinophils Relative: 2.2 % (ref 0.0–5.0)
HCT: 37.4 % — ABNORMAL LOW (ref 39.0–52.0)
Hemoglobin: 12.7 g/dL — ABNORMAL LOW (ref 13.0–17.0)
LYMPHS ABS: 1.7 10*3/uL (ref 0.7–4.0)
LYMPHS PCT: 23.9 % (ref 12.0–46.0)
MCHC: 33.9 g/dL (ref 30.0–36.0)
MCV: 86.7 fl (ref 78.0–100.0)
Monocytes Absolute: 0.6 10*3/uL (ref 0.1–1.0)
Monocytes Relative: 8.4 % (ref 3.0–12.0)
NEUTROS ABS: 4.5 10*3/uL (ref 1.4–7.7)
NEUTROS PCT: 64.4 % (ref 43.0–77.0)
PLATELETS: 174 10*3/uL (ref 150.0–400.0)
RBC: 4.32 Mil/uL (ref 4.22–5.81)
RDW: 14 % (ref 11.5–15.5)
WBC: 6.9 10*3/uL (ref 4.0–10.5)

## 2018-06-15 ENCOUNTER — Other Ambulatory Visit: Payer: Medicare HMO

## 2018-06-15 ENCOUNTER — Encounter: Payer: Self-pay | Admitting: Physician Assistant

## 2018-06-15 ENCOUNTER — Ambulatory Visit (INDEPENDENT_AMBULATORY_CARE_PROVIDER_SITE_OTHER): Payer: Medicare HMO | Admitting: Physician Assistant

## 2018-06-15 VITALS — BP 148/70 | HR 73 | Temp 98.0°F | Ht 70.0 in | Wt 188.0 lb

## 2018-06-15 DIAGNOSIS — M47812 Spondylosis without myelopathy or radiculopathy, cervical region: Secondary | ICD-10-CM

## 2018-06-15 DIAGNOSIS — E7800 Pure hypercholesterolemia, unspecified: Secondary | ICD-10-CM

## 2018-06-15 DIAGNOSIS — R29818 Other symptoms and signs involving the nervous system: Secondary | ICD-10-CM | POA: Diagnosis not present

## 2018-06-15 DIAGNOSIS — H539 Unspecified visual disturbance: Secondary | ICD-10-CM | POA: Diagnosis not present

## 2018-06-15 MED ORDER — ACETAMINOPHEN-CODEINE #3 300-30 MG PO TABS: 1.0000 | ORAL_TABLET | ORAL | 0 refills | Status: DC | PRN

## 2018-06-15 NOTE — Patient Instructions (Signed)
It was great to see you.  You will be contacted about your referral and MRI.  If you develop any changes in symptoms or new symptoms and have concern for stroke --> please go to the ER.

## 2018-06-15 NOTE — Progress Notes (Signed)
[name] is a 77 y.o. male is here for a Consult  I acted as a Education administrator for [employer], PA-C Anselmo Pickler, LPN  History of Present Illness:   Chief Complaint  Patient presents with  . Vision changes    HPI  Patient reports a handful of episodes throughout his life of vision changes.  He reports in approximately 1978 he developed a sensation of a curtain being drawn over his left eye and his vision went completely black in the eye for a few minutes.  It shortly resolved and he went to his medical doctor who told him to return if his symptoms returned.  He did not have any work-up at that time.  In 2013 he developed "squiggles" in his periphery for a few seconds in his left eye.  This is happened a few times since then.  Most recently it happened 2 weeks ago and it lasted a few minutes rather than a few seconds.  And also this time last time it was in his right eye, and it covered about a third of the perimeter of his right eye.  He does have history of vertigo that is currently not affecting him.  He also has high cholesterol and nonischemic cardiomyopathy.  He is currently maintained on Eliquis, Coreg, Simvastatin.  He is compliant with his medications.  Coincidentally, his wife recently experienced the same squiggles in her vision.  And had an extensive work-up including a stat MRI, a referral to neurology and a carotid ultrasound.  Patient is concerned because he has comorbidities that she does not and he has also had the squiggles without any work-up.  He currently denies chest pain, dizziness, weakness on one side of his body, slurred speech, confusion, changes in gait, severe headache.  Patient's wife is present today and she also denies patient to have any of the symptoms.  He also is here to discuss his lumbar radiculopathy.  He had a injection 6 months ago that did not take.  He receives care from Dr. Arrie Eastern.  He has had prior injections that have helped.  He is  planning to return soon for a follow-up visit.  His pain continues to be uncontrolled, currently describes as severe.  He takes Tylenol without relief.  He is also currently on meloxicam, despite our recommendations to not be on this while he is on Eliquis.  Denies fever, numbness, tingling, changes in bowels and bladder.   There are no preventive care reminders to display for this patient.  Past Medical History:  Diagnosis Date  . Abdominal wall defect, acquired   . Blockage of coronary artery of heart (HCC)    30%  . Hearing aid worn   . Insulin resistance   . LV dysfunction    reduction  . Osteoarthritis   . Pulmonary embolism [facility])      Social History   Socioeconomic History  . Marital status: Married    Spouse name: Not on file  . Number of children: Not on file  . Years of education: Not on file  . Highest education level: Not on file  Occupational History  . Occupation: Doctor, hospital    Comment: wedding cakes, self imployed  Social Needs  . Financial resource strain: Not on file  . Food insecurity:    Worry: Not on file    Inability: Not on file  . Transportation needs:    Medical: Not on file    Non-medical: Not on file  Tobacco Use  . Smoking status: Former Smoker    Packs/day: 1.00    Years: 15.00    Pack years: 15.00    Last attempt to quit: 12/21/1985    Years since quitting: 32.5  . Smokeless tobacco: Never Used  . Tobacco comment: pack a day   Substance and Sexual Activity  . Alcohol use: No  . Drug use: No  . 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 Physical activity:    Days per week: Not on file    Minutes per session: Not on file  . Stress: Not on file  Relationships  . Social connections:    Talks on phone: Not on file    Gets together: Not on file    Attends religious service: Not on file    Active member of club or organization: Not on file    Attends meetings of  clubs or organizations: Not on file    Relationship status: Not on file  . Intimate partner violence:    Fear of current or ex partner: Not on file    Emotionally abused: Not on file    Physically abused: Not on file    Forced sexual activity: Not on file  Other Topics Concern  . Not on file  Social History Narrative   Born in [place].     Past Surgical History:  Procedure Laterality Date  . ACNE CYST REMOVAL  2012   Back   . APPENDECTOMY    . CARDIAC CATHETERIZATION    . COLECTOMY  11/2013   [facility], 2nd to diverticulitis  . RT hip replacement    . TONSILLECTOMY      Family History  Problem Relation Age of Onset  . Heart disease Mother        pacemaker    PMHx, SurgHx, SocialHx, FamHx, Medications, and Allergies were reviewed in the Visit Navigator and updated as appropriate.   Patient Active Problem List   Diagnosis Date Noted  . Chronic pain of both knees 05/24/2017  . Cervical spondylosis without myelopathy 03/28/2017  . Pain in both hands 03/28/2017  . Thoracic aortic atherosclerosis ([place]) 03/04/2017  . Osteoarthritis 03/02/2017  . Community acquired pneumonia of left lower lobe of lung ([place]) 03/02/2017  . Vertigo 03/02/2017  . Cardiomyopathy ([place]) 04/30/2015  . Mitral regurgitation 04/30/2015  . CAD (coronary artery disease) 05/21/2014  . Aortic sclerosis 04/09/2014  . History of pulmonary embolism 12/19/2013  . Diverticulitis of colon (without mention of hemorrhage)(562.11) 10/19/2013  . Diverticulitis of colon 08/26/2013  . Diastolic dysfunction 86/76[birth year]1950  . Pulmonary hypertension ([place]) 07/20/2013  . Hypokalemia 03/28/2013  . Paroxysmal atrial fibrillation ([place]) 02/03/2013  . Abnormal stress echo 01/18/2013  . Low back pain 12/05/2012  . Degenerative arthritis of left hip 12/05/2012  . Hard of hearing 08/28/2011  . TACHYCARDIA 07/23/2010  . ABNORMAL FINDINGS, ELEVATED BP W/O HTN 03/14/2007  . HYPERCHOLESTEROLEMIA 12/20/2006  . Hypothyroidism  11/09/2006    Social History   Tobacco Use  . Smoking status: Former Smoker    Packs/day: 1.00    Years: 15.00    Pack years: 15.00    Last attempt to quit: 12/21/1985    Years since quitting: 32.5  . Smokeless tobacco: Never Used  . Tobacco comment: pack a day   Substance Use Topics  . Alcohol use: No  . Drug use: No    Current Medications and Allergies:  Current Outpatient Medications:  .  apixaban (ELIQUIS) 5 MG TABS tablet, Take 5 mg by mouth 2 (two) times daily. , Disp: , Rfl:  .  carvedilol (COREG) 6.25 MG tablet, Take 6.25 mg by mouth 2 (two) times daily with a meal. , Disp: , Rfl:  .  cetirizine (ZYRTEC) 10 MG tablet, Take by mouth., Disp: , Rfl:  .  levothyroxine (SYNTHROID, LEVOTHROID) 75 MCG tablet, TAKE 1 TABLET BY MOUTH ONCE DAILY BEFORE BREAKFAST, NEEDS FOLLOW UP APPOINTMENT FOR MORE REFILLS, Disp: 90 tablet, Rfl: 1 .  meclizine (ANTIVERT) 25 MG tablet, Take 1 tablet (25 mg total) by mouth as needed., Disp: 20 tablet, Rfl: 0 .  meloxicam (MOBIC) 15 MG tablet, Take 1 tablet (15 mg total) by mouth daily., Disp: 90 tablet, Rfl: 1 .  Multiple Vitamin (MULTIVITAMIN) tablet, Take 1 tablet by mouth daily.  , Disp: , Rfl:  .  omeprazole (PRILOSEC) 20 MG capsule, Take 20 mg by mouth daily.  , Disp: , Rfl:  .  simvastatin (ZOCOR) 40 MG tablet, Take 1 tablet (40 mg total) by mouth daily at 6 PM., Disp: 90 tablet, Rfl: 0 .  acetaminophen-codeine (TYLENOL #3) 300-30 MG tablet, Take 1 tablet by mouth every 4 (four) hours as needed for moderate pain., Disp: 20 tablet, Rfl: 0   Allergies  Allergen Reactions  . Flagyl [Metronidazole] Nausea And Vomiting    States IV form causes to allergy  . Lipitor [Atorvastatin] Other (See Comments)    Myalgia    Review of Systems   ROS  Negative unless otherwise specified per HPI.  Vitals:   Vitals:   06/15/18 1059  BP: (!) 148/70  Pulse: 73  Temp: 98 F (36.7 C)  TempSrc: Oral  SpO2: 97%  Weight: 188 lb (85.3 kg)  Height:  5\' 10"  (1.778 m)     Body mass index is 26.98 kg/m.   Physical Exam:    Physical Exam  Constitutional: He appears well-developed. He is cooperative.  Non-toxic appearance. He does not have a sickly appearance. He does not appear ill. No distress.  Cardiovascular: Normal rate, regular rhythm, S1 normal, S2 normal, normal heart sounds and normal pulses.  No LE edema  Pulmonary/Chest: Effort normal and breath sounds normal.  Musculoskeletal:  Difficulty with rising from seated position due to back pain. Limited exam due to pain.   Neurological: He is alert. He has normal strength. No cranial nerve deficit or sensory deficit. Coordination and gait normal. GCS eye subscore is 4. GCS verbal subscore is 5. GCS motor subscore is 6.  Skin: Skin is warm, dry and intact.  Psychiatric: He has a normal mood and affect. His speech is normal and behavior is normal.  Nursing note and vitals reviewed.    Assessment and Plan:    Shavon was seen today for vision changes.  Diagnoses and all orders for this visit:  Vision changes, Other symptoms and signs involving the nervous system, HYPERCHOLESTEROLEMIA Neuro exam today is normal.  He does have significant concerns about his vision and possible history of stroke/TIA.  Patient is currently requesting an MRI and neurology evaluation, specifically would like to see Dr. Felecia Shelling, who recently evaluated his wife.  I discussed that if he were to develop any changes in symptoms or other stroke-like symptoms he is to go to the ER immediately.  Patient verbalized understanding.  Continue cardiac meds per cardiology. -     MR Brain Wo Contrast; Future -     Ambulatory referral  to Neurology  Cervical spondylosis without myelopathy Further management per Dr. Arrie Eastern.  I provided a short prescription of Tylenol #3.  I also again reviewed with him the risks of taking meloxicam with Eliquis.  He states that he is going to consider stop taking the meloxicam, as he  thinks that it really is not doing anything at this point.  Other orders -     acetaminophen-codeine (TYLENOL #3) 300-30 MG tablet; Take 1 tablet by mouth every 4 (four) hours as needed for moderate pain.    . Reviewed expectations re: course of current medical issues. . Discussed self-management of symptoms. . Outlined signs and symptoms indicating need for more acute intervention. . Patient verbalized understanding and all questions were answered. . See orders for this visit as documented in the electronic medical record. . Patient received an After Visit Summary.  CMA or LPN served as scribe during this visit. History, Physical, and Plan performed by medical provider. Documentation and orders reviewed and attested to.   [name], PA-C [place], [place] 06/15/2018  Follow-up: No follow-ups on file.

## 2018-06-21 DIAGNOSIS — H527 Unspecified disorder of refraction: Secondary | ICD-10-CM | POA: Diagnosis not present

## 2018-06-21 DIAGNOSIS — H25813 Combined forms of age-related cataract, bilateral: Secondary | ICD-10-CM | POA: Diagnosis not present

## 2018-06-22 DIAGNOSIS — M9904 Segmental and somatic dysfunction of sacral region: Secondary | ICD-10-CM | POA: Diagnosis not present

## 2018-06-22 DIAGNOSIS — M9905 Segmental and somatic dysfunction of pelvic region: Secondary | ICD-10-CM | POA: Diagnosis not present

## 2018-06-22 DIAGNOSIS — M9903 Segmental and somatic dysfunction of lumbar region: Secondary | ICD-10-CM | POA: Diagnosis not present

## 2018-06-29 DIAGNOSIS — R630 Anorexia: Secondary | ICD-10-CM | POA: Diagnosis not present

## 2018-06-29 DIAGNOSIS — R634 Abnormal weight loss: Secondary | ICD-10-CM | POA: Diagnosis not present

## 2018-06-29 DIAGNOSIS — R6881 Early satiety: Secondary | ICD-10-CM | POA: Diagnosis not present

## 2018-07-05 ENCOUNTER — Encounter: Payer: Self-pay | Admitting: Physician Assistant

## 2018-07-05 ENCOUNTER — Other Ambulatory Visit: Payer: Self-pay | Admitting: Physician Assistant

## 2018-07-05 ENCOUNTER — Ambulatory Visit (INDEPENDENT_AMBULATORY_CARE_PROVIDER_SITE_OTHER): Payer: Medicare HMO | Admitting: Physician Assistant

## 2018-07-05 VITALS — BP 140/70 | HR 74 | Temp 98.2°F | Ht 70.0 in | Wt 189.2 lb

## 2018-07-05 DIAGNOSIS — Z87891 Personal history of nicotine dependence: Secondary | ICD-10-CM | POA: Diagnosis not present

## 2018-07-05 DIAGNOSIS — D225 Melanocytic nevi of trunk: Secondary | ICD-10-CM | POA: Diagnosis not present

## 2018-07-05 DIAGNOSIS — D485 Neoplasm of uncertain behavior of skin: Secondary | ICD-10-CM | POA: Diagnosis not present

## 2018-07-05 DIAGNOSIS — R634 Abnormal weight loss: Secondary | ICD-10-CM | POA: Diagnosis not present

## 2018-07-05 DIAGNOSIS — L814 Other melanin hyperpigmentation: Secondary | ICD-10-CM | POA: Diagnosis not present

## 2018-07-05 DIAGNOSIS — M47812 Spondylosis without myelopathy or radiculopathy, cervical region: Secondary | ICD-10-CM

## 2018-07-05 DIAGNOSIS — L821 Other seborrheic keratosis: Secondary | ICD-10-CM | POA: Diagnosis not present

## 2018-07-05 DIAGNOSIS — L820 Inflamed seborrheic keratosis: Secondary | ICD-10-CM | POA: Diagnosis not present

## 2018-07-05 MED ORDER — ACETAMINOPHEN-CODEINE #3 300-30 MG PO TABS: 1.0000 | ORAL_TABLET | ORAL | 0 refills | Status: DC | PRN

## 2018-07-05 NOTE — Telephone Encounter (Signed)
Spoke to pt, told him unfortunately due to the STOP Act (opiod act) [facility] can not refill your pain medication without seeing you. Pt verbalized understanding. Asked pt if he can come in today. Pt said yes. Asked him 3:20 or 3:40? Pt said 3:20. Told pt okay appt scheduled for 3:20 with Samantha. Pt verbalized understanding.

## 2018-07-05 NOTE — Telephone Encounter (Signed)
Please call patient.  Upon further review of the recent [place] (the opioid law), he is going to need a visit with [place] before I can send in a refill.  Please inform patient.  [name] PA-C

## 2018-07-05 NOTE — Progress Notes (Signed)
[name] is a 77 y.o. male is here to discuss: Medication Refill for Tylenol # 3.  I acted as a Education administrator for [employer], PA-C Anselmo Pickler, LPN  History of Present Illness:   Chief Complaint  Patient presents with  . Medication Refill    HPI  Patient has an appointment with Dr. Arrie Eastern for Monday 07/11/18. I last saw him on 06/15/18 for his lumbar radiculopathy. He has been taking Tylenol #3 BID since that time. His pain remains the same and denies new symptoms. Denies fever, numbness, tingling, changes in bowel and bladder. He stopped his meloxicam.  There are no preventive care reminders to display for this patient.  Past Medical History:  Diagnosis Date  . Abdominal wall defect, acquired   . Blockage of coronary artery of heart (HCC)    30%  . Hearing aid worn   . Insulin resistance   . LV dysfunction    reduction  . Osteoarthritis   . Pulmonary embolism [facility])      Social History   Socioeconomic History  . Marital status: Married    Spouse name: Not on file  . Number of children: Not on file  . Years of education: Not on file  . Highest education level: Not on file  Occupational History  . Occupation: Doctor, hospital    Comment: wedding cakes, self imployed  Social Needs  . Financial resource strain: Not on file  . Food insecurity:    Worry: Not on file    Inability: Not on file  . Transportation needs:    Medical: Not on file    Non-medical: Not on file  Tobacco Use  . Smoking status: Former Smoker    Packs/day: 1.00    Years: 15.00    Pack years: 15.00    Last attempt to quit: 12/21/1985    Years since quitting: 32.5  . Smokeless tobacco: Never Used  . Tobacco comment: pack a day   Substance and Sexual Activity  . Alcohol use: No  . Drug use: No  . 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 Physical activity:    Days per week: Not on file    Minutes per session:  Not on file  . Stress: Not on file  Relationships  . Social connections:    Talks on phone: Not on file    Gets together: Not on file    Attends religious service: Not on file    Active member of club or organization: Not on file    Attends meetings of clubs or organizations: Not on file    Relationship status: Not on file  . Intimate partner violence:    Fear of current or ex partner: Not on file    Emotionally abused: Not on file    Physically abused: Not on file    Forced sexual activity: Not on file  Other Topics Concern  . Not on file  Social History Narrative   Born in [place].     Past Surgical History:  Procedure Laterality Date  . ACNE CYST REMOVAL  2012   Back   . APPENDECTOMY    . CARDIAC CATHETERIZATION    . COLECTOMY  11/2013   [facility], 2nd to diverticulitis  . RT hip replacement    . TONSILLECTOMY      Family History  Problem Relation Age of Onset  . Heart disease Mother  pacemaker    PMHx, SurgHx, SocialHx, FamHx, Medications, and Allergies were reviewed in the Visit Navigator and updated as appropriate.   Patient Active Problem List   Diagnosis Date Noted  . Chronic pain of both knees 05/24/2017  . Cervical spondylosis without myelopathy 03/28/2017  . Pain in both hands 03/28/2017  . Thoracic aortic atherosclerosis ([place]) 03/04/2017  . Osteoarthritis 03/02/2017  . Community acquired pneumonia of left lower lobe of lung ([place]) 03/02/2017  . Vertigo 03/02/2017  . Cardiomyopathy ([place]) 04/30/2015  . Mitral regurgitation 04/30/2015  . CAD (coronary artery disease) 05/21/2014  . Aortic sclerosis 04/09/2014  . History of pulmonary embolism 12/19/2013  . Diverticulitis of colon (without mention of hemorrhage)(562.11) 10/19/2013  . Diverticulitis of colon 08/26/2013  . Diastolic dysfunction 66/44/0347  . Pulmonary hypertension ([place]) 07/20/2013  . Hypokalemia 03/28/2013  . Paroxysmal atrial fibrillation ([place]) 02/03/2013  . Abnormal stress echo  01/18/2013  . Low back pain 12/05/2012  . Degenerative arthritis of left hip 12/05/2012  . Hard of hearing 08/28/2011  . TACHYCARDIA 07/23/2010  . ABNORMAL FINDINGS, ELEVATED BP W/O HTN 03/14/2007  . HYPERCHOLESTEROLEMIA 12/20/2006  . Hypothyroidism 11/09/2006    Social History   Tobacco Use  . Smoking status: Former Smoker    Packs/day: 1.00    Years: 15.00    Pack years: 15.00    Last attempt to quit: 12/21/1985    Years since quitting: 32.5  . Smokeless tobacco: Never Used  . Tobacco comment: pack a day   Substance Use Topics  . Alcohol use: No  . Drug use: No    Current Medications and Allergies:    Current Outpatient Medications:  .  acetaminophen-codeine (TYLENOL #3) 300-30 MG tablet, Take 1 tablet by mouth every 4 (four) hours as needed for moderate pain., Disp: 20 tablet, Rfl: 0 .  apixaban (ELIQUIS) 5 MG TABS tablet, Take 5 mg by mouth 2 (two) times daily. , Disp: , Rfl:  .  carvedilol (COREG) 6.25 MG tablet, Take 6.25 mg by mouth 2 (two) times daily with a meal. , Disp: , Rfl:  .  cetirizine (ZYRTEC) 10 MG tablet, Take by mouth., Disp: , Rfl:  .  levothyroxine (SYNTHROID, LEVOTHROID) 75 MCG tablet, TAKE 1 TABLET BY MOUTH ONCE DAILY BEFORE BREAKFAST, NEEDS FOLLOW UP APPOINTMENT FOR MORE REFILLS, Disp: 90 tablet, Rfl: 1 .  meclizine (ANTIVERT) 25 MG tablet, Take 1 tablet (25 mg total) by mouth as needed., Disp: 20 tablet, Rfl: 0 .  meloxicam (MOBIC) 15 MG tablet, Take 1 tablet (15 mg total) by mouth daily., Disp: 90 tablet, Rfl: 1 .  Multiple Vitamin (MULTIVITAMIN) tablet, Take 1 tablet by mouth daily.  , Disp: , Rfl:  .  omeprazole (PRILOSEC) 20 MG capsule, Take 20 mg by mouth daily.  , Disp: , Rfl:  .  simvastatin (ZOCOR) 40 MG tablet, Take 1 tablet (40 mg total) by mouth daily at 6 PM., Disp: 90 tablet, Rfl: 0   Allergies  Allergen Reactions  . Flagyl [Metronidazole] Nausea And Vomiting    States IV form causes to allergy  . Lipitor [Atorvastatin] Other (See  Comments)    Myalgia    Review of Systems   ROS  Negative unless otherwise specified per HPI.   Vitals:   Vitals:   07/05/18 1530  BP: 140/70  Pulse: 74  Temp: 98.2 F (36.8 C)  TempSrc: Oral  SpO2: 95%  Weight: 189 lb 4 oz (85.8 kg)  Height: 5\' 10"  (1.778 m)  Body mass index is 27.15 kg/m.   Physical Exam:    Physical Exam  Constitutional: He appears well-developed. He is cooperative.  Non-toxic appearance. He does not have a sickly appearance. He does not appear ill. No distress.  Cardiovascular: Normal rate, regular rhythm, S1 normal, S2 normal, normal heart sounds and normal pulses.  No LE edema  Pulmonary/Chest: Effort normal and breath sounds normal.  Musculoskeletal:  Decreased ROM 2/2 pain with flexion/extension, lateral side bends, and rotation. Reproducible tenderness with deep palpation to bilateral paraspinal muscles, L>R. No bony tenderness. No evidence of erythema, rash or ecchymosis.    Neurological: He is alert. GCS eye subscore is 4. GCS verbal subscore is 5. GCS motor subscore is 6.  Skin: Skin is warm, dry and intact.  Psychiatric: He has a normal mood and affect. His speech is normal and behavior is normal.  Nursing note and vitals reviewed.    Assessment and Plan:    Solly was seen today for medication refill.  Diagnoses and all orders for this visit:  Cervical spondylosis without myelopathy  Other orders -     acetaminophen-codeine (TYLENOL #3) 300-30 MG tablet; Take 1 tablet by mouth every 4 (four) hours as needed for moderate pain.   [place] Controlled Substance Database reviewed today regarding patient. Patient is compliant with [place] regarding pharmacy use and one-prescribing provider. It is appropriate to continue current medication regimen. I discussed with him that I will provide one more refill at this time, and Dr. Arrie Eastern should provide further direction of how to manage pain after appointment on Monday 07/11/18. Patient verbalizes  understanding at this time.   . Reviewed expectations re: course of current medical issues. . Discussed self-management of symptoms. . Outlined signs and symptoms indicating need for more acute intervention. . Patient verbalized understanding and all questions were answered. . See orders for this visit as documented in the electronic medical record. . Patient received an After Visit Summary.  CMA or LPN served as scribe during this visit. History, Physical, and Plan performed by medical provider. Documentation and orders reviewed and attested to.   [name], PA-C [place] [place] 07/05/2018  Follow-up: No follow-ups on file.

## 2018-07-05 NOTE — Telephone Encounter (Signed)
Discussed pt with Aldona Bar and she will send Rx to pharmacy.

## 2018-07-05 NOTE — Telephone Encounter (Signed)
Left message on voicemail to call office.  

## 2018-07-05 NOTE — Telephone Encounter (Signed)
Please call patient and let him know that the Tylenol with Codeine was a short prescription to manage his pain until he saw Dr. Arrie Eastern. Unfortunately, I do not prescribe long-term pain management medications. Will need office visit to further discuss if this is something that he is concerned about.  [name] PA-C 07/05/2018

## 2018-07-05 NOTE — Telephone Encounter (Signed)
Controlled substance: Tylenol #3  Pharmacy: [facility] [address]. [place], [place] [address]  Status: Rx filled on 06/15/18 # 20  New Findings: [name]   Additional Comments: Pt scheduled to come in today for refill. Samantha aware.

## 2018-07-05 NOTE — Telephone Encounter (Signed)
Pt called back, told him that the Tylenol with Codeine was a short prescription to manage his pain until he saw Dr. Arrie Eastern. Unfortunately, I do not prescribe long-term pain management medications. Will need office visit to further discuss if this is something that he is concerned about. Pt verbalized understanding and said he is appt with Dr. Arrie Eastern is next Monday. Pt said he tried to stretch them out as long as he could but he is out and is in pain. Told pt I will discuss with Aldona Bar and see if she will send a few more tablets till your appointment. Pt verbalized understanding.

## 2018-07-06 DIAGNOSIS — M9904 Segmental and somatic dysfunction of sacral region: Secondary | ICD-10-CM | POA: Diagnosis not present

## 2018-07-06 DIAGNOSIS — M9903 Segmental and somatic dysfunction of lumbar region: Secondary | ICD-10-CM | POA: Diagnosis not present

## 2018-07-06 DIAGNOSIS — M9905 Segmental and somatic dysfunction of pelvic region: Secondary | ICD-10-CM | POA: Diagnosis not present

## 2018-07-07 DIAGNOSIS — R630 Anorexia: Secondary | ICD-10-CM | POA: Diagnosis not present

## 2018-07-07 DIAGNOSIS — R1013 Epigastric pain: Secondary | ICD-10-CM | POA: Diagnosis not present

## 2018-07-07 DIAGNOSIS — K2950 Unspecified chronic gastritis without bleeding: Secondary | ICD-10-CM | POA: Diagnosis not present

## 2018-07-11 DIAGNOSIS — M5416 Radiculopathy, lumbar region: Secondary | ICD-10-CM | POA: Diagnosis not present

## 2018-07-11 DIAGNOSIS — M5136 Other intervertebral disc degeneration, lumbar region: Secondary | ICD-10-CM | POA: Diagnosis not present

## 2018-07-11 DIAGNOSIS — Z7901 Long term (current) use of anticoagulants: Secondary | ICD-10-CM | POA: Diagnosis not present

## 2018-07-11 DIAGNOSIS — I482 Chronic atrial fibrillation: Secondary | ICD-10-CM | POA: Diagnosis not present

## 2018-07-11 DIAGNOSIS — M48061 Spinal stenosis, lumbar region without neurogenic claudication: Secondary | ICD-10-CM | POA: Diagnosis not present

## 2018-07-13 ENCOUNTER — Encounter: Payer: Self-pay | Admitting: *Deleted

## 2018-07-13 ENCOUNTER — Ambulatory Visit
Admission: RE | Admit: 2018-07-13 | Discharge: 2018-07-13 | Disposition: A | Discharge status: Home / Self Care | Payer: Medicare HMO | Source: Ambulatory Visit | Attending: Physician Assistant | Admitting: Physician Assistant

## 2018-07-13 DIAGNOSIS — H905 Unspecified sensorineural hearing loss: Secondary | ICD-10-CM | POA: Diagnosis not present

## 2018-07-13 DIAGNOSIS — R29818 Other symptoms and signs involving the nervous system: Secondary | ICD-10-CM

## 2018-07-13 DIAGNOSIS — H539 Unspecified visual disturbance: Secondary | ICD-10-CM

## 2018-07-18 ENCOUNTER — Ambulatory Visit: Payer: Medicare HMO | Admitting: Neurology

## 2018-07-18 ENCOUNTER — Encounter: Payer: Self-pay | Admitting: Neurology

## 2018-07-18 VITALS — BP 134/75 | HR 63 | Ht 70.0 in | Wt 192.0 lb

## 2018-07-18 DIAGNOSIS — Z7901 Long term (current) use of anticoagulants: Secondary | ICD-10-CM | POA: Diagnosis not present

## 2018-07-18 DIAGNOSIS — H9193 Unspecified hearing loss, bilateral: Secondary | ICD-10-CM

## 2018-07-18 DIAGNOSIS — G08 Intracranial and intraspinal phlebitis and thrombophlebitis: Secondary | ICD-10-CM | POA: Diagnosis not present

## 2018-07-18 DIAGNOSIS — G459 Transient cerebral ischemic attack, unspecified: Secondary | ICD-10-CM

## 2018-07-18 DIAGNOSIS — I429 Cardiomyopathy, unspecified: Secondary | ICD-10-CM

## 2018-07-18 DIAGNOSIS — H539 Unspecified visual disturbance: Secondary | ICD-10-CM | POA: Insufficient documentation

## 2018-07-18 NOTE — Progress Notes (Signed)
[facility]  PATIENT: [name] DOB: 1941-01-25  REFERRING DOCTOR OR PCP:  [name], PA-C ([place]) SOURCE: Patient, notes from PCP, imaging and lab reports, MRI images personally reviewed.  _________________________________   HISTORICAL  CHIEF COMPLAINT:  Chief Complaint  Patient presents with  . New Patient (Initial Visit)    Referred by [name], PA for an abnormal MRI.     HISTORY OF PRESENT ILLNESS:  I had the pleasure seeing patient, [name], at [facility] for neurologic consultation regarding his visual changes, hearing loss and abnormal brain MRI.  He is a 77 yo man who had several episodes of half moon shaped visual effects on the left in 2013, sometimes with squiggly lines.    He saw ophthalmology an no problem was identified.   In 2016, he had a few more similar episodes.  They would last 30-60 seconds, sometimes less.   He did well until this year when he had several episodes within 2 months.    The most recent episode was 06/29/2018 which was different.   He had an upper right visual defect with a gray band lasting 90 seconds.   This was the first one on the right and also involved graying of vision instead of squiggly lines.   He mentioned this to his PCP and an MRI of the brain was ordered.   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    It lifted and he felt back to normal.    He had a carotid Doppler study 5/16 80/2013 and it showed mild calcification at the bifurcation with mild stenosis estimated to be less than 50%.  He is on Eliquis for Pulmonary embolism (small) an episode of paroxysmal atrial fibrillation and cardiomyopathy (has EF% reportedly 35%).    Of note, the only time he has been found to have A. fib was  when he was hospitalized with severe pneumonia.   He sees [facility])  He has had problems with his haring on the left x 30+ years and more recently on his left.    He has had hearing aids x 6 years, though they do not help the left as much as the right.    I personally reviewed the MRI of the brain dated 07/13/2018.  There is mild age-appropriate cortical atrophy and very minimal chronic microvascular ischemic change.  There is abnormal signal in the sigmoid sinus on the right that could represent thrombosis.  This study was done without contrast.  REVIEW OF SYSTEMS: Constitutional: No fevers, chills, sweats, or change in appetite Eyes: No visual changes, double vision, eye pain Ear, nose and throat: He has left greater than the right hearing loss.  Sometimes he has dizziness. Cardiovascular: No chest pain, palpitations.   See above Respiratory: No shortness of breath at rest or with exertion.   No wheezes.  He has snoring. GastrointestinaI: No nausea, vomiting, diarrhea, abdominal pain, fecal incontinence Genitourinary: No dysuria, urinary retention or frequency.  No nocturia. Musculoskeletal: No neck pain, back pain Integumentary: No rash, pruritus, skin lesions Neurological: as above Psychiatric: No depression at this time.  No anxiety Endocrine: No palpitations, diaphoresis, change in appetite, change in weigh or increased thirst Hematologic/Lymphatic:He is on anticoagulation.  He  bruises easily. Allergic/Immunologic: No itchy/runny eyes, nasal congestion, recent allergic reactions, rashes  ALLERGIES: Allergies  Allergen Reactions  . Flagyl [Metronidazole] Nausea And Vomiting    States IV form causes to allergy  . Lipitor [Atorvastatin] Other (See Comments)    Myalgia    HOME MEDICATIONS:  Current Outpatient Medications:  .  acetaminophen-codeine (TYLENOL #3) 300-30 MG tablet, Take 1 tablet by mouth every 4 (four) hours as needed for moderate pain.,  Disp: 20 tablet, Rfl: 0 .  apixaban (ELIQUIS) 5 MG TABS tablet, Take 5 mg by mouth 2 (two) times daily. , Disp: , Rfl:  .  carvedilol (COREG) 6.25 MG tablet, Take 6.25 mg by mouth 2 (two) times daily with a meal. , Disp: , Rfl:  .  levothyroxine (SYNTHROID, LEVOTHROID) 75 MCG tablet, TAKE 1 TABLET BY MOUTH ONCE DAILY BEFORE BREAKFAST, NEEDS FOLLOW UP APPOINTMENT FOR MORE REFILLS, Disp: 90 tablet, Rfl: 1 .  lisinopril (PRINIVIL,ZESTRIL) 5 MG tablet, TAKE 1 TABLET BY MOUTH TWICE DAILY, Disp: , Rfl:  .  simvastatin (ZOCOR) 40 MG tablet, Take 1 tablet (40 mg total) by mouth daily at 6 PM., Disp: 90 tablet, Rfl: 0  PAST MEDICAL HISTORY: Past Medical History:  Diagnosis Date  . Abdominal wall defect, acquired   . Blockage of coronary artery of heart (HCC)    30%  . Hearing aid worn   . Insulin resistance   . LV dysfunction    reduction  . Osteoarthritis   . Pulmonary embolism ([place])     PAST SURGICAL HISTORY: Past Surgical History:  Procedure Laterality Date  . ACNE CYST REMOVAL  2012   Back   . APPENDECTOMY    . CARDIAC CATHETERIZATION    . COLECTOMY  11/2013   [facility], 2nd to diverticulitis  . RT hip replacement    . TONSILLECTOMY      FAMILY HISTORY: Family History  Problem Relation Age of Onset  . Heart disease Mother        pacemaker    SOCIAL HISTORY:  Social History   Socioeconomic History  . Marital status: Married    Spouse name: Not on file  . Number of children: Not on file  . Years of education: Not on file  . Highest education level: Not on file  Occupational History  . Occupation: Doctor, hospital    Comment: wedding cakes, self imployed  Social Needs  . Financial resource strain: Not on file  . Food insecurity:    Worry: Not on file    Inability: Not on file  . Transportation needs:    Medical: Not on file    Non-medical: Not on file  Tobacco Use  . Smoking status: Former Smoker    Packs/day: 1.00    Years: 15.00    Pack years: 15.00    Last  attempt to quit: 12/21/1985    Years since quitting: 32.5  . Smokeless tobacco: Never Used  . Tobacco comment: pack a day   Substance and Sexual Activity  . Alcohol use: No  . Drug use: No  . 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 Physical activity:    Days per week: Not on file    Minutes per session: Not on file  . Stress: Not on file  Relationships  . Social connections:    Talks on phone: Not on file    Gets together: Not on file  Attends religious service: Not on file    Active member of club or organization: Not on file    Attends meetings of clubs or organizations: Not on file    Relationship status: Not on file  . Intimate partner violence:    Fear of current or ex partner: Not on file    Emotionally abused: Not on file    Physically abused: Not on file    Forced sexual activity: Not on file  Other Topics Concern  . Not on file  Social History Narrative   Born in [place].      PHYSICAL EXAM  Vitals:   07/18/18 1309  BP: 134/75  Pulse: 63  Weight: 192 lb (87.1 kg)  Height: 5\' 10"  (1.778 m)    Body mass index is 27.55 kg/m.   General: The patient is well-developed and well-nourished and in no acute distress  Eyes:  Funduscopic exam shows normal optic discs and retinal vessels.  Neck: The neck is supple, no carotid bruits are noted.  The neck is nontender.  Cardiovascular: The heart has a regular rate and rhythm with a normal S1 and S2. There were no murmurs, gallops or rubs.     Neurologic Exam  Mental status: The patient is alert and oriented x 3 at the time of the examination. The patient has apparent normal recent and remote memory, with an apparently normal attention span and concentration ability.   Speech is normal.  Cranial nerves: Extraocular movements are full. Pupils are equal, round, and reactive to light and accomodation.  Visual fields are full.  Facial symmetry is  present. There is good facial sensation to soft touch bilaterally.Facial strength is normal.  Trapezius and sternocleidomastoid strength is normal. No dysarthria is noted.  The tongue is midline, and the patient has symmetric elevation of the soft palate. No obvious hearing deficits are noted.  Motor:  Muscle bulk is normal.   Tone is normal. Strength is  5 / 5 in all 4 extremities.   Sensory: Sensory testing is intact to pinprick, soft touch and vibration sensation in all 4 extremities.  Coordination: Cerebellar testing reveals good finger-nose-finger and heel-to-shin bilaterally.  Gait and station: Station is normal.   Gait is normal. Tandem gait is normal. Romberg is negative.   Reflexes: Deep tendon reflexes are symmetric and normal bilaterally.   Plantar responses are flexor.    DIAGNOSTIC DATA (LABS, IMAGING, TESTING) - I reviewed patient records, labs, notes, testing and imaging myself where available.  Lab Results  Component Value Date   WBC 6.9 06/14/2018   HGB 12.7 (L) 06/14/2018   HCT 37.4 (L) 06/14/2018   MCV 86.7 06/14/2018   PLT 174.0 06/14/2018      Component Value Date/Time   NA 138 03/09/2018 0838   K 4.3 03/09/2018 0838   CL 108 03/09/2018 0838   CO2 23 03/09/2018 0838   GLUCOSE 103 (H) 03/09/2018 0838   BUN 21 03/09/2018 0838   CREATININE 1.02 03/09/2018 0838   CALCIUM 9.0 03/09/2018 0838   PROT 6.2 03/09/2018 0838   ALBUMIN 4.3 09/14/2017 1143   AST 20 03/09/2018 0838   ALT 11 03/09/2018 0838   ALKPHOS 42 09/14/2017 1143   BILITOT 0.7 03/09/2018 0838   GFRNONAA 89 01/29/2016 0805   GFRAA >89 01/29/2016 0805   Lab Results  Component Value Date   CHOL 151 03/09/2018   HDL 39 (L) 03/09/2018   LDLCALC 93 03/09/2018   LDLDIRECT 168 (H) 11/23/2006   TRIG  98 03/09/2018   CHOLHDL 3.9 03/09/2018   Lab Results  Component Value Date   HGBA1C 5.5 07/08/2016   Lab Results  Component Value Date   VITAMINB12 451 12/27/2012   Lab Results  Component  Value Date   TSH 1.46 03/09/2018       ASSESSMENT AND PLAN  TIA (transient ischemic attack) - Plan: MR MRA HEAD WO CONTRAST, MR MRA NECK W WO CONTRAST  Intracranial and intraspinal phlebitis and thrombophlebitis - Plan: MR Venogram Head  Visual disturbance  Current use of long term anticoagulation  Bilateral hearing loss, unspecified hearing loss type  Cardiomyopathy, unspecified type (HCC)  Dural sinus thrombosis   In summary, Mr. Diloreto is a 77 year old man who has had episodes of visual disturbances.  The last episode involved graying of his vision and could represent a transient ischemic attack.  The other episodes are less worrisome and could be related to aura.  Additionally, he had a left visual symptoms that were likely a TIA about 30 years ago.  On his MRI, he has a possible dural sinus thrombosis involving the sigmoid sinus on the right.  As a study was done without contrast, the extent of stenosis or occlusion cannot be determined.  To better evaluate the symptoms and the abnormal brain MRI, we will check an MR angiogram of the neck arteries as well as an intracranial MRA.  If he has severe extracranial stenosis, I would consider referral to a vascular surgeon.  If he has significant intracranial stenosis, consider adding aspirin to the Eliquis after discussing with his cardiologist.  We will also check an MR venogram to determine if he has significant flow deficit involving the right sigmoid dural sinus.    As he is already on Eliquis, other treatment would probably not be necessary.  We will call him with the results a couple days after they are performed.  He will return to see me in 3 months or sooner if he has new or worsening symptoms or based on the results.   Marrissa Dai A. Felecia Shelling, MD, [facility] 5/40/0867, 6:19 PM Certified in Neurology, Clinical Neurophysiology, Sleep Medicine, Pain Medicine and Neuroimaging  [facility] [address], [place], [place] [address] [phone]

## 2018-07-20 DIAGNOSIS — G8929 Other chronic pain: Secondary | ICD-10-CM | POA: Diagnosis not present

## 2018-07-20 DIAGNOSIS — M48061 Spinal stenosis, lumbar region without neurogenic claudication: Secondary | ICD-10-CM | POA: Diagnosis not present

## 2018-07-20 DIAGNOSIS — M5440 Lumbago with sciatica, unspecified side: Secondary | ICD-10-CM | POA: Diagnosis not present

## 2018-07-21 DIAGNOSIS — R634 Abnormal weight loss: Secondary | ICD-10-CM | POA: Diagnosis not present

## 2018-07-21 DIAGNOSIS — R630 Anorexia: Secondary | ICD-10-CM | POA: Diagnosis not present

## 2018-07-21 DIAGNOSIS — R6881 Early satiety: Secondary | ICD-10-CM | POA: Diagnosis not present

## 2018-07-24 DIAGNOSIS — M5416 Radiculopathy, lumbar region: Secondary | ICD-10-CM | POA: Diagnosis not present

## 2018-07-24 DIAGNOSIS — M48061 Spinal stenosis, lumbar region without neurogenic claudication: Secondary | ICD-10-CM | POA: Diagnosis not present

## 2018-07-24 DIAGNOSIS — M5126 Other intervertebral disc displacement, lumbar region: Secondary | ICD-10-CM | POA: Diagnosis not present

## 2018-08-04 ENCOUNTER — Telehealth: Payer: Self-pay | Admitting: Physician Assistant

## 2018-08-04 NOTE — Telephone Encounter (Signed)
Aldona Bar, please see message and advise. I do not see an Ultrasound order.

## 2018-08-04 NOTE — Telephone Encounter (Signed)
Copied from [place] [phone]. Topic: Quick Communication - See Telephone Encounter >> Aug 04, 2018  9:19 AM Mylinda Latina, NT wrote: CRM for notification. See Telephone encounter for: 08/04/18. Patient called and states he just received a notice or information about an Abd U/S that [facility] had ordered. He is wanting to talk to the nurse to see why he needs this test done. He is confused about something CB#[phone]

## 2018-08-04 NOTE — Telephone Encounter (Signed)
I do not see where I have ordered an abdominal ultrasound for this patient.  Please call the patient and obtain more information if possible.  [name] PA-C

## 2018-08-05 NOTE — Telephone Encounter (Signed)
Called patient 3 times and it went straight to VM all 3 times. I left a message about [place] not putting the order in for an abdominal ultrasound, that I was just calling for more information about it and to call our office back at [phone].

## 2018-08-05 NOTE — Telephone Encounter (Signed)
Patient called office back and stated that [facility] had scheduled him an appointment for an aorta ultrasound for medicare for October 4th. I stated that Aldona Bar did not order this. I also spoke with Aldona Bar during our call and she said she thinks he should just cancel the appointment because he has aged out of that ultrasound. I cancelled the appointment and informed patient that I was canceling it and Im sorry for the confusion. Patient verbalized understanding.

## 2018-08-08 ENCOUNTER — Telehealth: Payer: Self-pay | Admitting: Neurology

## 2018-08-08 NOTE — Telephone Encounter (Signed)
[employer] medicare orders were sent to GI. He is scheduled for 08/15/18.[name] Anderson [place] with [facility] is working on his MRI's and she messaged me and informed me of this.[name]  "Hey,  I just wanted to give you a heads up on the order for this patient's MR Venogram. Insurance is most likely going to deny because it is so similar to the other exam ordered.   Per our billing office and managers: the generic billing code of [id number] will not pay when it is billed. "   Please advise.

## 2018-08-08 NOTE — Telephone Encounter (Signed)
Noted, I spoke to [place] at [facility] and she is going to start the case of the MR Veogram.

## 2018-08-09 ENCOUNTER — Ambulatory Visit: Payer: Medicare HMO | Admitting: Neurology

## 2018-08-10 NOTE — Telephone Encounter (Signed)
The MRA Head and MRA Neck auth: [id number] (exp. 08/09/18 to 11/07/18)

## 2018-08-15 ENCOUNTER — Other Ambulatory Visit: Payer: Medicare HMO

## 2018-08-15 ENCOUNTER — Ambulatory Visit
Admission: RE | Admit: 2018-08-15 | Discharge: 2018-08-15 | Disposition: A | Discharge status: Home / Self Care | Payer: Medicare HMO | Source: Ambulatory Visit | Attending: Neurology | Admitting: Neurology

## 2018-08-15 DIAGNOSIS — G459 Transient cerebral ischemic attack, unspecified: Secondary | ICD-10-CM | POA: Diagnosis not present

## 2018-08-15 MED ORDER — GADOBENATE DIMEGLUMINE 529 MG/ML IV SOLN
18.0000 mL | Freq: Once | INTRAVENOUS | Status: AC | PRN
  Administered 2018-08-15: 18 mL via INTRAVENOUS

## 2018-08-16 NOTE — Telephone Encounter (Signed)
Estill Bamberg with [employer] called and informed me that the MR Venogram has been denied and its because it is the same CPT as a MRA Head and the patient had the MRA Head yesterday... If you would like to do a peer to peer their phone number is [phone] and the case number is [id number] and the case does close on 08/18/18.

## 2018-08-17 DIAGNOSIS — M5136 Other intervertebral disc degeneration, lumbar region: Secondary | ICD-10-CM | POA: Diagnosis not present

## 2018-08-17 DIAGNOSIS — M9904 Segmental and somatic dysfunction of sacral region: Secondary | ICD-10-CM | POA: Diagnosis not present

## 2018-08-17 DIAGNOSIS — M9903 Segmental and somatic dysfunction of lumbar region: Secondary | ICD-10-CM | POA: Diagnosis not present

## 2018-08-17 DIAGNOSIS — M9905 Segmental and somatic dysfunction of pelvic region: Secondary | ICD-10-CM | POA: Diagnosis not present

## 2018-08-17 NOTE — Telephone Encounter (Signed)
The cas number [id number] was canceled. I started a new case [id number]. I spoke to [facility] [employer] nurse and explained to her about the situation how this exam is for a MRV and not a MRA since it is the same cpt code. I gave her clinical information and I will also fax notes as well.. She is unable to approve it on her level and that she has to send this case to the MD level.

## 2018-08-19 ENCOUNTER — Ambulatory Visit
Admission: RE | Admit: 2018-08-19 | Discharge: 2018-08-19 | Disposition: A | Discharge status: Home / Self Care | Payer: Medicare HMO | Source: Ambulatory Visit | Attending: Neurology | Admitting: Neurology

## 2018-08-19 ENCOUNTER — Telehealth: Payer: Self-pay | Admitting: *Deleted

## 2018-08-19 DIAGNOSIS — G08 Intracranial and intraspinal phlebitis and thrombophlebitis: Secondary | ICD-10-CM

## 2018-08-19 NOTE — Telephone Encounter (Signed)
-----   Message from Britt Bottom, MD sent at 08/16/2018  8:22 PM EDT ----- Please let the patient know that the MRI's did not show any significant blockages

## 2018-08-19 NOTE — Telephone Encounter (Signed)
Spoke with pt. and reviewed below MRI results. He verbalized understanding of same/fim 

## 2018-08-19 NOTE — Telephone Encounter (Signed)
[employer] auth for the MR Venogram is [id number] (exp. 08/18/18 to 11/16/18) patient is scheduled at GI for 08/19/18.

## 2018-08-23 ENCOUNTER — Telehealth: Payer: Self-pay | Admitting: Neurology

## 2018-08-23 NOTE — Telephone Encounter (Signed)
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  Anticoagulation will be recommended.  He is already on Eliquis.  I recommend that he continue on that medication.  I called and got voicemail and left a message.  I will try to reach him later this week

## 2018-08-24 DIAGNOSIS — M9905 Segmental and somatic dysfunction of pelvic region: Secondary | ICD-10-CM | POA: Diagnosis not present

## 2018-08-24 DIAGNOSIS — M9903 Segmental and somatic dysfunction of lumbar region: Secondary | ICD-10-CM | POA: Diagnosis not present

## 2018-08-24 DIAGNOSIS — M5136 Other intervertebral disc degeneration, lumbar region: Secondary | ICD-10-CM | POA: Diagnosis not present

## 2018-08-24 DIAGNOSIS — M9904 Segmental and somatic dysfunction of sacral region: Secondary | ICD-10-CM | POA: Diagnosis not present

## 2018-08-26 ENCOUNTER — Telehealth: Payer: Self-pay | Admitting: *Deleted

## 2018-08-26 NOTE — Telephone Encounter (Signed)
-----   Message from Britt Bottom, MD sent at 08/26/2018  2:09 PM EDT ----- I had called but did not reach him.  The MR venogram confirms the chronic right transverse/sigmoid sinus thrombosis and reduced flow in the internal jugular vein.  Anticoagulation is recommended.  He is already on Eliquis and should continue.

## 2018-08-26 NOTE — Telephone Encounter (Signed)
Spoke to patient - he is aware of his results and understands to continue Eliquis.

## 2018-08-29 ENCOUNTER — Encounter: Payer: Self-pay | Admitting: Podiatry

## 2018-08-29 ENCOUNTER — Ambulatory Visit: Payer: Medicare HMO | Admitting: Podiatry

## 2018-08-29 ENCOUNTER — Other Ambulatory Visit: Payer: Self-pay | Admitting: Physician Assistant

## 2018-08-29 DIAGNOSIS — L600 Ingrowing nail: Secondary | ICD-10-CM

## 2018-08-29 DIAGNOSIS — M79672 Pain in left foot: Secondary | ICD-10-CM

## 2018-08-29 DIAGNOSIS — B351 Tinea unguium: Secondary | ICD-10-CM

## 2018-08-29 DIAGNOSIS — M79671 Pain in right foot: Secondary | ICD-10-CM

## 2018-08-29 NOTE — Progress Notes (Signed)
Subjective: 77 y.o. year old male patient presents complaining of painful nails. Patient requests toe nails trimmed.  Big toes been hurting in shoes. Has back problem and cannot reach down to toes.  Objective: Dermatologic: Thick yellow deformed nails x 10. Painful ingrown nail with inflamed nail border left great toe lateral border. Vascular: Pedal pulses are all palpable. Orthopedic: No gross deformities. Neurologic: All epicritic and tactile sensations grossly intact.  Assessment: Dystrophic mycotic nails x 10. Ingrown hallucal nail left great toe.  Treatment: All mycotic nails and ingrown nails debrided.  Left great toe cleansed with Iodine and dressing applied. Continue with Salsunblue shampoo scrub. Return in 3 months or sooner if needed.

## 2018-08-29 NOTE — Patient Instructions (Signed)
Seen for hypertrophic nails. All nails debrided. Return in 3 months or as needed.  

## 2018-08-30 DIAGNOSIS — M9904 Segmental and somatic dysfunction of sacral region: Secondary | ICD-10-CM | POA: Diagnosis not present

## 2018-08-30 DIAGNOSIS — M9905 Segmental and somatic dysfunction of pelvic region: Secondary | ICD-10-CM | POA: Diagnosis not present

## 2018-08-30 DIAGNOSIS — M5136 Other intervertebral disc degeneration, lumbar region: Secondary | ICD-10-CM | POA: Diagnosis not present

## 2018-08-30 DIAGNOSIS — M9903 Segmental and somatic dysfunction of lumbar region: Secondary | ICD-10-CM | POA: Diagnosis not present

## 2018-08-31 DIAGNOSIS — M47816 Spondylosis without myelopathy or radiculopathy, lumbar region: Secondary | ICD-10-CM | POA: Insufficient documentation

## 2018-08-31 DIAGNOSIS — Z86711 Personal history of pulmonary embolism: Secondary | ICD-10-CM | POA: Diagnosis not present

## 2018-08-31 DIAGNOSIS — M5416 Radiculopathy, lumbar region: Secondary | ICD-10-CM | POA: Diagnosis not present

## 2018-08-31 DIAGNOSIS — I2510 Atherosclerotic heart disease of native coronary artery without angina pectoris: Secondary | ICD-10-CM | POA: Diagnosis not present

## 2018-08-31 DIAGNOSIS — Z7901 Long term (current) use of anticoagulants: Secondary | ICD-10-CM | POA: Diagnosis not present

## 2018-08-31 DIAGNOSIS — M5136 Other intervertebral disc degeneration, lumbar region: Secondary | ICD-10-CM | POA: Diagnosis not present

## 2018-08-31 DIAGNOSIS — M47817 Spondylosis without myelopathy or radiculopathy, lumbosacral region: Secondary | ICD-10-CM | POA: Diagnosis not present

## 2018-08-31 DIAGNOSIS — M48061 Spinal stenosis, lumbar region without neurogenic claudication: Secondary | ICD-10-CM | POA: Diagnosis not present

## 2018-09-01 DIAGNOSIS — K30 Functional dyspepsia: Secondary | ICD-10-CM | POA: Diagnosis not present

## 2018-09-01 DIAGNOSIS — D649 Anemia, unspecified: Secondary | ICD-10-CM | POA: Diagnosis not present

## 2018-09-03 DIAGNOSIS — M47896 Other spondylosis, lumbar region: Secondary | ICD-10-CM | POA: Diagnosis not present

## 2018-09-03 DIAGNOSIS — N401 Enlarged prostate with lower urinary tract symptoms: Secondary | ICD-10-CM | POA: Diagnosis not present

## 2018-09-03 DIAGNOSIS — Z885 Allergy status to narcotic agent status: Secondary | ICD-10-CM | POA: Diagnosis not present

## 2018-09-03 DIAGNOSIS — Z87891 Personal history of nicotine dependence: Secondary | ICD-10-CM | POA: Diagnosis not present

## 2018-09-03 DIAGNOSIS — M5137 Other intervertebral disc degeneration, lumbosacral region: Secondary | ICD-10-CM | POA: Diagnosis not present

## 2018-09-03 DIAGNOSIS — Z981 Arthrodesis status: Secondary | ICD-10-CM | POA: Diagnosis not present

## 2018-09-03 DIAGNOSIS — Z7901 Long term (current) use of anticoagulants: Secondary | ICD-10-CM | POA: Diagnosis not present

## 2018-09-03 DIAGNOSIS — M48061 Spinal stenosis, lumbar region without neurogenic claudication: Secondary | ICD-10-CM | POA: Diagnosis not present

## 2018-09-03 DIAGNOSIS — Z8673 Personal history of transient ischemic attack (TIA), and cerebral infarction without residual deficits: Secondary | ICD-10-CM | POA: Diagnosis not present

## 2018-09-03 DIAGNOSIS — R339 Retention of urine, unspecified: Secondary | ICD-10-CM | POA: Diagnosis not present

## 2018-09-03 DIAGNOSIS — I4891 Unspecified atrial fibrillation: Secondary | ICD-10-CM | POA: Diagnosis not present

## 2018-09-03 DIAGNOSIS — Z86711 Personal history of pulmonary embolism: Secondary | ICD-10-CM | POA: Diagnosis not present

## 2018-09-03 DIAGNOSIS — R338 Other retention of urine: Secondary | ICD-10-CM | POA: Diagnosis not present

## 2018-09-03 DIAGNOSIS — E785 Hyperlipidemia, unspecified: Secondary | ICD-10-CM | POA: Diagnosis not present

## 2018-09-03 DIAGNOSIS — G8929 Other chronic pain: Secondary | ICD-10-CM | POA: Diagnosis not present

## 2018-09-03 DIAGNOSIS — R200 Anesthesia of skin: Secondary | ICD-10-CM | POA: Diagnosis not present

## 2018-09-03 DIAGNOSIS — M545 Low back pain: Secondary | ICD-10-CM | POA: Diagnosis not present

## 2018-09-03 DIAGNOSIS — R531 Weakness: Secondary | ICD-10-CM | POA: Diagnosis not present

## 2018-09-03 DIAGNOSIS — M5126 Other intervertebral disc displacement, lumbar region: Secondary | ICD-10-CM | POA: Diagnosis not present

## 2018-09-03 DIAGNOSIS — Z888 Allergy status to other drugs, medicaments and biological substances status: Secondary | ICD-10-CM | POA: Diagnosis not present

## 2018-09-05 DIAGNOSIS — E538 Deficiency of other specified B group vitamins: Secondary | ICD-10-CM | POA: Diagnosis not present

## 2018-09-05 DIAGNOSIS — Z960 Presence of urogenital implants: Secondary | ICD-10-CM | POA: Diagnosis not present

## 2018-09-05 DIAGNOSIS — I5020 Unspecified systolic (congestive) heart failure: Secondary | ICD-10-CM | POA: Diagnosis not present

## 2018-09-05 DIAGNOSIS — Y846 Urinary catheterization as the cause of abnormal reaction of the patient, or of later complication, without mention of misadventure at the time of the procedure: Secondary | ICD-10-CM | POA: Diagnosis not present

## 2018-09-05 DIAGNOSIS — I10 Essential (primary) hypertension: Secondary | ICD-10-CM | POA: Diagnosis not present

## 2018-09-05 DIAGNOSIS — E785 Hyperlipidemia, unspecified: Secondary | ICD-10-CM | POA: Diagnosis not present

## 2018-09-05 DIAGNOSIS — R39198 Other difficulties with micturition: Secondary | ICD-10-CM | POA: Diagnosis not present

## 2018-09-05 DIAGNOSIS — M5136 Other intervertebral disc degeneration, lumbar region: Secondary | ICD-10-CM | POA: Diagnosis not present

## 2018-09-05 DIAGNOSIS — T83511A Infection and inflammatory reaction due to indwelling urethral catheter, initial encounter: Secondary | ICD-10-CM | POA: Diagnosis not present

## 2018-09-05 DIAGNOSIS — N401 Enlarged prostate with lower urinary tract symptoms: Secondary | ICD-10-CM | POA: Diagnosis not present

## 2018-09-05 DIAGNOSIS — Z86718 Personal history of other venous thrombosis and embolism: Secondary | ICD-10-CM | POA: Diagnosis not present

## 2018-09-05 DIAGNOSIS — R338 Other retention of urine: Secondary | ICD-10-CM | POA: Diagnosis not present

## 2018-09-05 DIAGNOSIS — R32 Unspecified urinary incontinence: Secondary | ICD-10-CM | POA: Diagnosis not present

## 2018-09-05 DIAGNOSIS — I4891 Unspecified atrial fibrillation: Secondary | ICD-10-CM | POA: Diagnosis not present

## 2018-09-05 DIAGNOSIS — R270 Ataxia, unspecified: Secondary | ICD-10-CM | POA: Insufficient documentation

## 2018-09-05 DIAGNOSIS — M48061 Spinal stenosis, lumbar region without neurogenic claudication: Secondary | ICD-10-CM | POA: Diagnosis not present

## 2018-09-05 DIAGNOSIS — N390 Urinary tract infection, site not specified: Secondary | ICD-10-CM | POA: Diagnosis not present

## 2018-09-05 DIAGNOSIS — I480 Paroxysmal atrial fibrillation: Secondary | ICD-10-CM | POA: Diagnosis not present

## 2018-09-05 DIAGNOSIS — R339 Retention of urine, unspecified: Secondary | ICD-10-CM | POA: Diagnosis not present

## 2018-09-05 DIAGNOSIS — R2689 Other abnormalities of gait and mobility: Secondary | ICD-10-CM | POA: Diagnosis not present

## 2018-09-05 DIAGNOSIS — E039 Hypothyroidism, unspecified: Secondary | ICD-10-CM | POA: Diagnosis not present

## 2018-09-05 DIAGNOSIS — Z86711 Personal history of pulmonary embolism: Secondary | ICD-10-CM | POA: Diagnosis not present

## 2018-09-05 DIAGNOSIS — M5116 Intervertebral disc disorders with radiculopathy, lumbar region: Secondary | ICD-10-CM | POA: Diagnosis not present

## 2018-09-05 DIAGNOSIS — R2681 Unsteadiness on feet: Secondary | ICD-10-CM | POA: Diagnosis not present

## 2018-09-05 DIAGNOSIS — Z7409 Other reduced mobility: Secondary | ICD-10-CM | POA: Diagnosis not present

## 2018-09-05 DIAGNOSIS — K5900 Constipation, unspecified: Secondary | ICD-10-CM | POA: Diagnosis not present

## 2018-09-05 DIAGNOSIS — B9620 Unspecified Escherichia coli [E. coli] as the cause of diseases classified elsewhere: Secondary | ICD-10-CM | POA: Diagnosis not present

## 2018-09-05 DIAGNOSIS — M5416 Radiculopathy, lumbar region: Secondary | ICD-10-CM | POA: Diagnosis not present

## 2018-09-05 DIAGNOSIS — R202 Paresthesia of skin: Secondary | ICD-10-CM | POA: Diagnosis not present

## 2018-09-05 DIAGNOSIS — R200 Anesthesia of skin: Secondary | ICD-10-CM | POA: Diagnosis not present

## 2018-09-05 DIAGNOSIS — Z7901 Long term (current) use of anticoagulants: Secondary | ICD-10-CM | POA: Diagnosis not present

## 2018-09-05 DIAGNOSIS — I5022 Chronic systolic (congestive) heart failure: Secondary | ICD-10-CM | POA: Diagnosis not present

## 2018-09-05 DIAGNOSIS — M545 Low back pain: Secondary | ICD-10-CM | POA: Diagnosis not present

## 2018-09-05 DIAGNOSIS — I110 Hypertensive heart disease with heart failure: Secondary | ICD-10-CM | POA: Diagnosis not present

## 2018-09-06 DIAGNOSIS — N390 Urinary tract infection, site not specified: Secondary | ICD-10-CM | POA: Insufficient documentation

## 2018-09-06 DIAGNOSIS — B9620 Unspecified Escherichia coli [E. coli] as the cause of diseases classified elsewhere: Secondary | ICD-10-CM | POA: Insufficient documentation

## 2018-09-07 ENCOUNTER — Ambulatory Visit: Payer: Medicare HMO | Admitting: Physician Assistant

## 2018-09-07 DIAGNOSIS — E538 Deficiency of other specified B group vitamins: Secondary | ICD-10-CM | POA: Insufficient documentation

## 2018-09-13 ENCOUNTER — Ambulatory Visit: Payer: Medicare HMO | Admitting: Rehabilitative and Restorative Service Providers"

## 2018-09-13 DIAGNOSIS — I480 Paroxysmal atrial fibrillation: Secondary | ICD-10-CM | POA: Diagnosis not present

## 2018-09-13 DIAGNOSIS — Z7409 Other reduced mobility: Secondary | ICD-10-CM | POA: Diagnosis not present

## 2018-09-13 DIAGNOSIS — R2689 Other abnormalities of gait and mobility: Secondary | ICD-10-CM | POA: Diagnosis not present

## 2018-09-13 DIAGNOSIS — I428 Other cardiomyopathies: Secondary | ICD-10-CM | POA: Diagnosis not present

## 2018-09-13 DIAGNOSIS — G459 Transient cerebral ischemic attack, unspecified: Secondary | ICD-10-CM | POA: Diagnosis not present

## 2018-09-13 DIAGNOSIS — I4891 Unspecified atrial fibrillation: Secondary | ICD-10-CM | POA: Diagnosis not present

## 2018-09-13 DIAGNOSIS — I340 Nonrheumatic mitral (valve) insufficiency: Secondary | ICD-10-CM | POA: Diagnosis not present

## 2018-09-15 ENCOUNTER — Ambulatory Visit (INDEPENDENT_AMBULATORY_CARE_PROVIDER_SITE_OTHER): Payer: Medicare HMO | Admitting: Physician Assistant

## 2018-09-15 ENCOUNTER — Encounter: Payer: Self-pay | Admitting: Physician Assistant

## 2018-09-15 VITALS — BP 148/70 | HR 60 | Temp 97.8°F | Ht 70.0 in | Wt 182.4 lb

## 2018-09-15 DIAGNOSIS — M5136 Other intervertebral disc degeneration, lumbar region: Secondary | ICD-10-CM | POA: Diagnosis not present

## 2018-09-15 DIAGNOSIS — Z7409 Other reduced mobility: Secondary | ICD-10-CM | POA: Diagnosis not present

## 2018-09-15 DIAGNOSIS — I4891 Unspecified atrial fibrillation: Secondary | ICD-10-CM | POA: Diagnosis not present

## 2018-09-15 DIAGNOSIS — E538 Deficiency of other specified B group vitamins: Secondary | ICD-10-CM | POA: Diagnosis not present

## 2018-09-15 DIAGNOSIS — R2689 Other abnormalities of gait and mobility: Secondary | ICD-10-CM | POA: Diagnosis not present

## 2018-09-15 DIAGNOSIS — T83511D Infection and inflammatory reaction due to indwelling urethral catheter, subsequent encounter: Secondary | ICD-10-CM

## 2018-09-15 DIAGNOSIS — Z09 Encounter for follow-up examination after completed treatment for conditions other than malignant neoplasm: Secondary | ICD-10-CM

## 2018-09-15 DIAGNOSIS — N390 Urinary tract infection, site not specified: Secondary | ICD-10-CM

## 2018-09-15 LAB — URINALYSIS, ROUTINE W REFLEX MICROSCOPIC
Bilirubin Urine: NEGATIVE
HGB URINE DIPSTICK: NEGATIVE
Ketones, ur: NEGATIVE
LEUKOCYTES UA: NEGATIVE
NITRITE: NEGATIVE
RBC / HPF: NONE SEEN (ref 0–?)
SPECIFIC GRAVITY, URINE: 1.01 (ref 1.000–1.030)
Total Protein, Urine: NEGATIVE
URINE GLUCOSE: NEGATIVE
Urobilinogen, UA: 0.2 (ref 0.0–1.0)
pH: 6 (ref 5.0–8.0)

## 2018-09-15 MED ORDER — CYANOCOBALAMIN 1000 MCG/ML IJ SOLN
1000.0000 ug | Freq: Once | INTRAMUSCULAR | Status: AC
  Administered 2018-09-15: 1000 ug via INTRAMUSCULAR

## 2018-09-15 NOTE — Patient Instructions (Addendum)
It was great to see you!  Please make an appointment to return in 1 week for your next B12 injection. We will be in touch regarding your urine.  Continue oral B12 medications.  Take care,  [name] PA-C

## 2018-09-15 NOTE — Progress Notes (Signed)
[name] is a 77 y.o. male is here to discuss: hospital follow-up.  I acted as a Education administrator for [employer], PA-C Anselmo Pickler, LPN  History of Present Illness:   Chief Complaint  Patient presents with  . Follow-up    HPI  Patient was hospitalized at [facility] from 9/16-9/20. Required foley and developed a catheter associated UTI, received rocephin for this.  He had a facet injection on 08/31/18 and was admitted for urinary retention and gait instability s/p injection. He had significant relief of back pain from injection. Extensive work-up for his urinary retention and LE paresthesias included lumbar MRI, thoracic MRI, CT and brain MRI without any findings. Work-up did reveal low B12. Received 1000 mcg vitamin B12 injections x 4 days and was discharged on oral supplements. Vitamin B12 was found to be 175.  He is doing outpatient PT and is doing well with this. Planning to go twice a week for 6 weeks. Continues to have residual unusual sensations in his legs, including "feelings of compression" of L lower leg and "partial numbness" when trying to urinate. Symptoms are generally improved. He is present with wife who confirms this. Denies any issues with bowel incontinence. He is eating and drinking well. Denies further dysuria.  There are no preventive care reminders to display for this patient.  Past Medical History:  Diagnosis Date  . Abdominal wall defect, acquired   . Blockage of coronary artery of heart (HCC)    30%  . Hearing aid worn   . Insulin resistance   . LV dysfunction    reduction  . Osteoarthritis   . Pulmonary embolism [facility])      Social History   Socioeconomic History  . Marital status: Married    Spouse name: Not on file  . Number of children: Not on file  . Years of education: Not on file  . Highest education level: Not on file  Occupational History  . Occupation: Doctor, hospital    Comment: wedding cakes, self imployed  Social  Needs  . Financial resource strain: Not on file  . Food insecurity:    Worry: Not on file    Inability: Not on file  . Transportation needs:    Medical: Not on file    Non-medical: Not on file  Tobacco Use  . Smoking status: Former Smoker    Packs/day: 1.00    Years: 15.00    Pack years: 15.00    Last attempt to quit: 12/21/1985    Years since quitting: 32.7  . Smokeless tobacco: Never Used  . Tobacco comment: pack a day   Substance and Sexual Activity  . Alcohol use: No  . Drug use: No  . 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 Physical activity:    Days per week: Not on file    Minutes per session: Not on file  . Stress: Not on file  Relationships  . Social connections:    Talks on phone: Not on file    Gets together: Not on file    Attends religious service: Not on file    Active member of club or organization: Not on file    Attends meetings of clubs or organizations: Not on file    Relationship status: Not on file  . Intimate partner violence:    Fear of current or ex partner: Not on file  Emotionally abused: Not on file    Physically abused: Not on file    Forced sexual activity: Not on file  Other Topics Concern  . Not on file  Social History Narrative   Born in [place].     Past Surgical History:  Procedure Laterality Date  . ACNE CYST REMOVAL  2012   Back   . APPENDECTOMY    . CARDIAC CATHETERIZATION    . COLECTOMY  11/2013   [facility], 2nd to diverticulitis  . RT hip replacement    . TONSILLECTOMY      Family History  Problem Relation Age of Onset  . Heart disease Mother        pacemaker    PMHx, SurgHx, SocialHx, FamHx, Medications, and Allergies were reviewed in the Visit Navigator and updated as appropriate.   Patient Active Problem List   Diagnosis Date Noted  . TIA (transient ischemic attack) 07/18/2018  . Visual disturbance 07/18/2018  . Dural sinus thrombosis  07/18/2018  . Current use of long term anticoagulation 11/18/2017  . Chronic pain of both knees 05/24/2017  . Cervical spondylosis without myelopathy 03/28/2017  . Pain in both hands 03/28/2017  . Thoracic aortic atherosclerosis ([place]) 03/04/2017  . Osteoarthritis 03/02/2017  . Community acquired pneumonia of left lower lobe of lung ([place]) 03/02/2017  . Vertigo 03/02/2017  . Lumbar radiculopathy 05/10/2015  . Lumbar degenerative disc disease 05/10/2015  . Cardiomyopathy ([place]) 04/30/2015  . Mitral regurgitation 04/30/2015  . Arthritis of left hip 01/29/2015  . CAD (coronary artery disease) 05/21/2014  . Aortic sclerosis 04/09/2014  . History of pulmonary embolism 12/19/2013  . Diverticulitis of colon (without mention of hemorrhage)(562.11) 10/19/2013  . Diverticulitis of colon 08/26/2013  . Diastolic dysfunction 44/02/4741  . Pulmonary hypertension ([place]) 07/20/2013  . Hypokalemia 03/28/2013  . Paroxysmal atrial fibrillation ([place]) 02/03/2013  . Abnormal stress echo 01/18/2013  . Low back pain 12/05/2012  . Degenerative arthritis of left hip 12/05/2012  . Hard of hearing 08/28/2011  . TACHYCARDIA 07/23/2010  . ABNORMAL FINDINGS, ELEVATED BP W/O HTN 03/14/2007  . HYPERCHOLESTEROLEMIA 12/20/2006  . Hypothyroidism 11/09/2006    Social History   Tobacco Use  . Smoking status: Former Smoker    Packs/day: 1.00    Years: 15.00    Pack years: 15.00    Last attempt to quit: 12/21/1985    Years since quitting: 32.7  . Smokeless tobacco: Never Used  . Tobacco comment: pack a day   Substance Use Topics  . Alcohol use: No  . Drug use: No    Current Medications and Allergies:    Current Outpatient Medications:  .  amoxicillin (AMOXIL) 500 MG capsule, as needed (dental prophylaxis)., Disp: , Rfl:  .  apixaban (ELIQUIS) 5 MG TABS tablet, Take 5 mg by mouth 2 (two) times daily. , Disp: , Rfl:  .  carvedilol (COREG) 6.25 MG tablet, Take 6.25 mg by mouth 2 (two) times daily with a meal.  , Disp: , Rfl:  .  fluticasone (FLONASE) 50 MCG/ACT nasal spray, Place into the nose., Disp: , Rfl:  .  levothyroxine (SYNTHROID, LEVOTHROID) 75 MCG tablet, TAKE 1 TABLET BY MOUTH ONCE DAILY BEFORE BREAKFAST, NEEDS FOLLOW UP APPOINTMENT FOR MORE REFILLS, Disp: 90 tablet, Rfl: 1 .  lisinopril (PRINIVIL,ZESTRIL) 5 MG tablet, TAKE 1 TABLET BY MOUTH TWICE DAILY, Disp: , Rfl:  .  meclizine (ANTIVERT) 25 MG tablet, Take by mouth., Disp: , Rfl:  .  meloxicam (MOBIC) 15 MG tablet, Take 15 mg by  mouth daily., Disp: , Rfl: 1 .  omeprazole (PRILOSEC OTC) 20 MG tablet, Take by mouth., Disp: , Rfl:  .  simvastatin (ZOCOR) 40 MG tablet, TAKE 1 TABLET BY MOUTH ONCE DAILY AT 6 IN THE EVENING, Disp: 90 tablet, Rfl: 0 .  vitamin B-12 (CYANOCOBALAMIN) 1000 MCG tablet, Take by mouth., Disp: , Rfl:    Allergies  Allergen Reactions  . Tramadol Other (See Comments)    Dizziness  . Flagyl [Metronidazole] Nausea And Vomiting    States IV form causes to allergy  . Flomax [Tamsulosin Hcl] Other (See Comments)    Pre-syncope  . Lipitor [Atorvastatin] Other (See Comments)    Myalgia    Review of Systems   ROS  Negative unless otherwise specified per HPI.  Vitals:   Vitals:   09/15/18 1018  BP: (!) 148/70  Pulse: 60  Temp: 97.8 F (36.6 C)  TempSrc: Oral  SpO2: 96%  Weight: 182 lb 6.1 oz (82.7 kg)  Height: 5\' 10"  (1.778 m)     Body mass index is 26.17 kg/m.   Physical Exam:    Physical Exam  Constitutional: He appears well-developed. He is cooperative.  Non-toxic appearance. He does not have a sickly appearance. He does not appear ill. No distress.  Cardiovascular: Normal rate, regular rhythm, S1 normal, S2 normal, normal heart sounds and normal pulses.  No LE edema  Pulmonary/Chest: Effort normal and breath sounds normal.  Neurological: He is alert. He has normal strength. Coordination and gait normal. GCS eye subscore is 4. GCS verbal subscore is 5. GCS motor subscore is 6.  Skin: Skin is  warm, dry and intact.  Psychiatric: He has a normal mood and affect. His speech is normal and behavior is normal.  Nursing note and vitals reviewed.    Assessment and Plan:    [place] was seen today for follow-up.  Diagnoses and all orders for this visit:  Hospital discharge follow-up We reviewed hospital work-up and plan of care.  B12 deficiency He is agreeable to weekly injections x 4 weeks and then monthly x 3 months. Will plan to check B12 in about 1 month. -     cyanocobalamin ((VITAMIN B-12)) injection 1,000 mcg  Urinary tract infection associated with catheterization of urinary tract, unspecified indwelling urinary catheter type, subsequent encounter Wife is requesting repeat UA. Will check today. -     Urinalysis, Routine w reflex microscopic -     Urine Culture  Lumbar degenerative disc disease Further work-up per Dr. Arrie Eastern.  . Reviewed expectations re: course of current medical issues. . Discussed self-management of symptoms. . Outlined signs and symptoms indicating need for more acute intervention. . Patient verbalized understanding and all questions were answered. . See orders for this visit as documented in the electronic medical record. . Patient received an After Visit Summary.  CMA or LPN served as scribe during this visit. History, Physical, and Plan performed by medical provider. The above documentation has been reviewed and is accurate and complete.  [name], PA-C [place] [place] 09/15/2018  Follow-up: No follow-ups on file.

## 2018-09-16 LAB — URINE CULTURE
MICRO NUMBER: 91158725
RESULT: NO GROWTH
SPECIMEN QUALITY: ADEQUATE

## 2018-09-21 DIAGNOSIS — I959 Hypotension, unspecified: Secondary | ICD-10-CM | POA: Insufficient documentation

## 2018-09-21 DIAGNOSIS — I2510 Atherosclerotic heart disease of native coronary artery without angina pectoris: Secondary | ICD-10-CM | POA: Diagnosis not present

## 2018-09-21 DIAGNOSIS — I428 Other cardiomyopathies: Secondary | ICD-10-CM | POA: Diagnosis not present

## 2018-09-21 DIAGNOSIS — I493 Ventricular premature depolarization: Secondary | ICD-10-CM | POA: Diagnosis not present

## 2018-09-21 DIAGNOSIS — I480 Paroxysmal atrial fibrillation: Secondary | ICD-10-CM | POA: Diagnosis not present

## 2018-09-22 ENCOUNTER — Ambulatory Visit: Payer: Medicare HMO

## 2018-09-23 ENCOUNTER — Encounter (HOSPITAL_COMMUNITY): Payer: Medicare HMO

## 2018-09-27 DIAGNOSIS — R69 Illness, unspecified: Secondary | ICD-10-CM | POA: Diagnosis not present

## 2018-09-28 DIAGNOSIS — E7800 Pure hypercholesterolemia, unspecified: Secondary | ICD-10-CM | POA: Diagnosis not present

## 2018-09-28 DIAGNOSIS — I340 Nonrheumatic mitral (valve) insufficiency: Secondary | ICD-10-CM | POA: Diagnosis not present

## 2018-09-28 DIAGNOSIS — I2510 Atherosclerotic heart disease of native coronary artery without angina pectoris: Secondary | ICD-10-CM | POA: Diagnosis not present

## 2018-09-28 DIAGNOSIS — I428 Other cardiomyopathies: Secondary | ICD-10-CM | POA: Diagnosis not present

## 2018-09-28 DIAGNOSIS — G459 Transient cerebral ischemic attack, unspecified: Secondary | ICD-10-CM | POA: Diagnosis not present

## 2018-09-28 DIAGNOSIS — I351 Nonrheumatic aortic (valve) insufficiency: Secondary | ICD-10-CM | POA: Diagnosis not present

## 2018-09-28 DIAGNOSIS — I480 Paroxysmal atrial fibrillation: Secondary | ICD-10-CM | POA: Diagnosis not present

## 2018-10-04 DIAGNOSIS — R29898 Other symptoms and signs involving the musculoskeletal system: Secondary | ICD-10-CM | POA: Diagnosis not present

## 2018-10-04 DIAGNOSIS — Z7409 Other reduced mobility: Secondary | ICD-10-CM | POA: Diagnosis not present

## 2018-10-07 ENCOUNTER — Ambulatory Visit (INDEPENDENT_AMBULATORY_CARE_PROVIDER_SITE_OTHER): Payer: Medicare HMO | Admitting: Physician Assistant

## 2018-10-07 ENCOUNTER — Encounter: Payer: Self-pay | Admitting: Physician Assistant

## 2018-10-07 VITALS — BP 126/68 | HR 59 | Temp 97.8°F | Ht 70.0 in | Wt 186.4 lb

## 2018-10-07 DIAGNOSIS — E538 Deficiency of other specified B group vitamins: Secondary | ICD-10-CM | POA: Diagnosis not present

## 2018-10-07 DIAGNOSIS — R8290 Unspecified abnormal findings in urine: Secondary | ICD-10-CM | POA: Diagnosis not present

## 2018-10-07 DIAGNOSIS — R710 Precipitous drop in hematocrit: Secondary | ICD-10-CM

## 2018-10-07 LAB — CBC WITH DIFFERENTIAL/PLATELET
BASOS PCT: 0.6 % (ref 0.0–3.0)
Basophils Absolute: 0.1 10*3/uL (ref 0.0–0.1)
Eosinophils Absolute: 0.1 10*3/uL (ref 0.0–0.7)
Eosinophils Relative: 1.2 % (ref 0.0–5.0)
HCT: 36.5 % — ABNORMAL LOW (ref 39.0–52.0)
Hemoglobin: 12.4 g/dL — ABNORMAL LOW (ref 13.0–17.0)
LYMPHS ABS: 1.4 10*3/uL (ref 0.7–4.0)
Lymphocytes Relative: 14.8 % (ref 12.0–46.0)
MCHC: 34 g/dL (ref 30.0–36.0)
MCV: 89.4 fl (ref 78.0–100.0)
MONO ABS: 0.9 10*3/uL (ref 0.1–1.0)
MONOS PCT: 9.3 % (ref 3.0–12.0)
NEUTROS ABS: 7.1 10*3/uL (ref 1.4–7.7)
NEUTROS PCT: 74.1 % (ref 43.0–77.0)
PLATELETS: 207 10*3/uL (ref 150.0–400.0)
RBC: 4.08 Mil/uL — ABNORMAL LOW (ref 4.22–5.81)
RDW: 14 % (ref 11.5–15.5)
WBC: 9.6 10*3/uL (ref 4.0–10.5)

## 2018-10-07 LAB — POCT URINALYSIS DIPSTICK
Bilirubin, UA: NEGATIVE
Glucose, UA: NEGATIVE
KETONES UA: NEGATIVE
Nitrite, UA: POSITIVE
Protein, UA: POSITIVE — AB
SPEC GRAV UA: 1.02 (ref 1.010–1.025)
Urobilinogen, UA: 0.2 E.U./dL
pH, UA: 5.5 (ref 5.0–8.0)

## 2018-10-07 LAB — BASIC METABOLIC PANEL
BUN: 22 mg/dL (ref 6–23)
CALCIUM: 8.9 mg/dL (ref 8.4–10.5)
CO2: 24 meq/L (ref 19–32)
CREATININE: 1.18 mg/dL (ref 0.40–1.50)
Chloride: 109 mEq/L (ref 96–112)
GFR: 63.7 mL/min (ref >60.00)
Glucose, Bld: 106 mg/dL — ABNORMAL HIGH (ref 70–99)
Potassium: 4.4 mEq/L (ref 3.5–5.1)
SODIUM: 141 meq/L (ref 135–145)

## 2018-10-07 LAB — VITAMIN B12: VITAMIN B 12: 351 pg/mL (ref 211–911)

## 2018-10-07 MED ORDER — AMOXICILLIN-POT CLAVULANATE 875-125 MG PO TABS: 1.0000 | ORAL_TABLET | Freq: Two times a day (BID) | ORAL | 0 refills | Status: DC

## 2018-10-07 NOTE — Patient Instructions (Signed)
It was great to see you!  Push fluids. Start oral antibiotic today.  We will call you with your urine culture and lab results at the beginning of next week.  Let's follow-up 1 week after completion of antibiotics to recheck your urine, sooner if you have concerns.  Take care,  [name] PA-C  Contact a doctor if:  You have back pain.  You have a fever.  You feel sick to your stomach (nauseous).  You throw up (vomit).  Your symptoms do not get better after 3 days.  Your symptoms go away and then come back. Get help right away if:  You have very bad back pain.  You have very bad lower belly (abdominal) pain.  You are throwing up and cannot keep down any medicines or water. This information is not intended to replace advice given to you by your health care provider. Make sure you discuss any questions you have with your health care provider. Document Released: 05/25/2008 Document Revised: 05/14/2016 Document Reviewed: 10/28/2015 Elsevier Interactive Patient Education  [employer].

## 2018-10-07 NOTE — Progress Notes (Signed)
[name] is a 77 y.o. male here for a new problem.  I acted as a Education administrator for [employer], PA-C Anselmo Pickler, LPN  History of Present Illness:   Chief Complaint  Patient presents with  . Urinary Tract Infection    Urinary Tract Infection   This is a new problem. Episode onset: Started 2 days ago. The problem occurs every urination. The problem has been unchanged. Quality: Pt states his urine has been cloudy the past few days. The patient is experiencing no pain. There has been no fever. Associated symptoms include nausea. Pertinent negatives include no chills, discharge, flank pain, frequency, hematuria, urgency or vomiting. He has tried increased fluids for the symptoms. His past medical history is significant for catheterization. There is no history of kidney stones.   Vitamin B12 Has a history of Vitamin B12 deficiency. Will plan to re-check today. He wasn't able to tolerate B12 injections, he suspects that they may have caused flushing.   Hemoglobin He is anxious about his hemoglobin as it has dropped in the past down to 12.5. Most recently it went up to 12.7. He would like this re-checked today.  Lab Results  Component Value Date   WBC 6.9 06/14/2018   HGB 12.7 (L) 06/14/2018   HCT 37.4 (L) 06/14/2018   MCV 86.7 06/14/2018   PLT 174.0 06/14/2018     Past Medical History:  Diagnosis Date  . Abdominal wall defect, acquired   . Blockage of coronary artery of heart (HCC)    30%  . Hearing aid worn   . Insulin resistance   . LV dysfunction    reduction  . Osteoarthritis   . Pulmonary embolism [facility])      Social History   Socioeconomic History  . Marital status: Married    Spouse name: Not on file  . Number of children: Not on file  . Years of education: Not on file  . Highest education level: Not on file  Occupational History  . Occupation: Doctor, hospital    Comment: wedding cakes, self imployed  Social Needs  . Financial resource strain: Not on  file  . Food insecurity:    Worry: Not on file    Inability: Not on file  . Transportation needs:    Medical: Not on file    Non-medical: Not on file  Tobacco Use  . Smoking status: Former Smoker    Packs/day: 1.00    Years: 15.00    Pack years: 15.00    Last attempt to quit: 12/21/1985    Years since quitting: 32.8  . Smokeless tobacco: Never Used  . Tobacco comment: pack a day   Substance and Sexual Activity  . Alcohol use: No  . Drug use: No  . 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 Physical activity:    Days per week: Not on file    Minutes per session: Not on file  . Stress: Not on file  Relationships  . Social connections:    Talks on phone: Not on file    Gets together: Not on file    Attends religious service: Not on file    Active member of club or organization: Not on file    Attends meetings of clubs or organizations: Not on file    Relationship status: Not on file  . Intimate partner violence:    Fear of current or ex partner:  Not on file    Emotionally abused: Not on file    Physically abused: Not on file    Forced sexual activity: Not on file  Other Topics Concern  . Not on file  Social History Narrative   Born in [place].     Past Surgical History:  Procedure Laterality Date  . ACNE CYST REMOVAL  2012   Back   . APPENDECTOMY    . CARDIAC CATHETERIZATION    . COLECTOMY  11/2013   [facility], 2nd to diverticulitis  . RT hip replacement    . TONSILLECTOMY      Family History  Problem Relation Age of Onset  . Heart disease Mother        pacemaker    Allergies  Allergen Reactions  . Tramadol Other (See Comments)    Dizziness  . Flagyl [Metronidazole] Nausea And Vomiting    States IV form causes to allergy  . Flomax [Tamsulosin Hcl] Other (See Comments)    Pre-syncope  . Lipitor [Atorvastatin] Other (See Comments)    Myalgia    Current Medications:   Current  Outpatient Medications:  .  amoxicillin (AMOXIL) 500 MG capsule, as needed (dental prophylaxis)., Disp: , Rfl:  .  apixaban (ELIQUIS) 5 MG TABS tablet, Take 5 mg by mouth 2 (two) times daily. , Disp: , Rfl:  .  carvedilol (COREG) 3.125 MG tablet, Take 3.125 mg by mouth 2 (two) times daily with a meal., Disp: , Rfl: 3 .  fluticasone (FLONASE) 50 MCG/ACT nasal spray, Place into the nose., Disp: , Rfl:  .  levothyroxine (SYNTHROID, LEVOTHROID) 75 MCG tablet, TAKE 1 TABLET BY MOUTH ONCE DAILY BEFORE BREAKFAST, NEEDS FOLLOW UP APPOINTMENT FOR MORE REFILLS, Disp: 90 tablet, Rfl: 1 .  lisinopril (PRINIVIL,ZESTRIL) 5 MG tablet, TAKE 1 TABLET BY MOUTH TWICE DAILY, Disp: , Rfl:  .  meclizine (ANTIVERT) 25 MG tablet, Take by mouth., Disp: , Rfl:  .  meloxicam (MOBIC) 15 MG tablet, Take 15 mg by mouth daily., Disp: , Rfl: 1 .  omeprazole (PRILOSEC OTC) 20 MG tablet, Take by mouth., Disp: , Rfl:  .  simvastatin (ZOCOR) 40 MG tablet, TAKE 1 TABLET BY MOUTH ONCE DAILY AT 6 IN THE EVENING, Disp: 90 tablet, Rfl: 0 .  vitamin B-12 (CYANOCOBALAMIN) 1000 MCG tablet, Take by mouth., Disp: , Rfl:  .  amoxicillin-clavulanate (AUGMENTIN) 875-125 MG tablet, Take 1 tablet by mouth 2 (two) times daily., Disp: 20 tablet, Rfl: 0   Review of Systems:   Review of Systems  Constitutional: Negative for chills.  Gastrointestinal: Positive for nausea. Negative for vomiting.  Genitourinary: Negative for flank pain, frequency, hematuria and urgency.    Vitals:   Vitals:   10/07/18 1029  BP: 126/68  Pulse: (!) 59  Temp: 97.8 F (36.6 C)  TempSrc: Oral  SpO2: 97%  Weight: 186 lb 6.1 oz (84.5 kg)  Height: 5\' 10"  (1.778 m)     Body mass index is 26.74 kg/m.  Physical Exam:   Physical Exam  Constitutional: He appears well-developed. He is cooperative.  Non-toxic appearance. He does not have a sickly appearance. He does not appear ill. No distress.  Cardiovascular: Normal rate, regular rhythm, S1 normal, S2 normal,  normal heart sounds and normal pulses.  No LE edema  Pulmonary/Chest: Effort normal and breath sounds normal.  Abdominal: Normal appearance and bowel sounds are normal. There is no rigidity, no rebound, no guarding and no CVA tenderness.  Neurological: He is alert. GCS eye subscore  is 4. GCS verbal subscore is 5. GCS motor subscore is 6.  Skin: Skin is warm, dry and intact.  Psychiatric: He has a normal mood and affect. His speech is normal and behavior is normal.  Nursing note and vitals reviewed.  Results for orders placed or performed in visit on 10/07/18  POCT urinalysis dipstick  Result Value Ref Range   Color, UA Yellow    Clarity, UA Cloudy    Glucose, UA Negative Negative   Bilirubin, UA Negative    Ketones, UA Negative    Spec Grav, UA 1.020 1.010 - 1.025   Blood, UA Moderate    pH, UA 5.5 5.0 - 8.0   Protein, UA Positive (A) Negative   Urobilinogen, UA 0.2 0.2 or 1.0 E.U./dL   Nitrite, UA Positive    Leukocytes, UA Large (3+) (A) Negative   Appearance     Odor      Assessment and Plan:    Arjan was seen today for urinary tract infection.  Diagnoses and all orders for this visit:  Cloudy urine Urine is concerning for cystitis. Based on last culture will treat with Augmentin and await current culture. Given lack of symptoms, I do want to do a test of cure after completion of antibiotics. ER precautions given. -     POCT urinalysis dipstick -     Urine Culture -     Basic metabolic panel  Decreased hemoglobin No symptoms. Recheck today. -     CBC with Differential/Platelet  B12 deficiency Re-check today. He is agreeable to re-starting B12 injections if needed. -     Vitamin B12  Other orders -     amoxicillin-clavulanate (AUGMENTIN) 875-125 MG tablet; Take 1 tablet by mouth 2 (two) times daily.    . Reviewed expectations re: course of current medical issues. . Discussed self-management of symptoms. . Outlined signs and symptoms indicating need for more  acute intervention. . Patient verbalized understanding and all questions were answered. . See orders for this visit as documented in the electronic medical record. . Patient received an After-Visit Summary.  CMA or LPN served as scribe during this visit. History, Physical, and Plan performed by medical provider. The above documentation has been reviewed and is accurate and complete.   [name], PA-C

## 2018-10-09 LAB — URINE CULTURE
MICRO NUMBER:: 91255594
SPECIMEN QUALITY: ADEQUATE

## 2018-10-11 DIAGNOSIS — M47816 Spondylosis without myelopathy or radiculopathy, lumbar region: Secondary | ICD-10-CM | POA: Diagnosis not present

## 2018-10-11 DIAGNOSIS — M5136 Other intervertebral disc degeneration, lumbar region: Secondary | ICD-10-CM | POA: Diagnosis not present

## 2018-10-12 DIAGNOSIS — B351 Tinea unguium: Secondary | ICD-10-CM | POA: Diagnosis not present

## 2018-10-12 DIAGNOSIS — L602 Onychogryphosis: Secondary | ICD-10-CM | POA: Diagnosis not present

## 2018-10-12 DIAGNOSIS — M79675 Pain in left toe(s): Secondary | ICD-10-CM | POA: Diagnosis not present

## 2018-10-12 DIAGNOSIS — L603 Nail dystrophy: Secondary | ICD-10-CM | POA: Diagnosis not present

## 2018-10-14 ENCOUNTER — Emergency Department (INDEPENDENT_AMBULATORY_CARE_PROVIDER_SITE_OTHER)
Admission: EM | Admit: 2018-10-14 | Discharge: 2018-10-14 | Disposition: A | Discharge status: Home / Self Care | Payer: Medicare HMO | Source: Home / Self Care | Attending: Family Medicine | Admitting: Family Medicine

## 2018-10-14 ENCOUNTER — Encounter: Payer: Self-pay | Admitting: Emergency Medicine

## 2018-10-14 DIAGNOSIS — Z23 Encounter for immunization: Secondary | ICD-10-CM | POA: Diagnosis not present

## 2018-10-14 DIAGNOSIS — S61217A Laceration without foreign body of left little finger without damage to nail, initial encounter: Secondary | ICD-10-CM

## 2018-10-14 MED ORDER — TETANUS-DIPHTH-ACELL PERTUSSIS 5-2.5-18.5 LF-MCG/0.5 IM SUSP
0.5000 mL | Freq: Once | INTRAMUSCULAR | Status: AC
  Administered 2018-10-14: 0.5 mL via INTRAMUSCULAR

## 2018-10-14 NOTE — ED Provider Notes (Signed)
[name] C[facility]    CSN: [id number] Arrival date & time: 10/14/18  1149     History   Chief Complaint Chief Complaint  Patient presents with  . Extremity Laceration    HPI [name] is a 77 y.o. male.   While using pruning shears yesterday, patient lacerated his left fifth finger.  He applied a bandage and presents today for suture repair.  His last Tdap was 04/20/12.  The history is provided by the patient.  Laceration  Location:  Finger Finger laceration location:  L little finger Length:  2.5cm Depth:  Through dermis Quality comment:  Flap laceration through dermis Bleeding: controlled   Time since incident:  1 day Laceration mechanism:  Metal edge Pain details:    Quality:  Dull   Severity:  Mild   Timing:  Constant   Progression:  Partially resolved Foreign body present:  No foreign bodies Worsened by:  Movement Ineffective treatments:  None tried Tetanus status:  Out of date Associated symptoms: no numbness, no redness and no swelling     Past Medical History:  Diagnosis Date  . Abdominal wall defect, acquired   . Blockage of coronary artery of heart (HCC)    30%  . Hearing aid worn   . Insulin resistance   . LV dysfunction    reduction  . Osteoarthritis   . Pulmonary embolism [facility])     Patient Active Problem List   Diagnosis Date Noted  . TIA (transient ischemic attack) 07/18/2018  . Visual disturbance 07/18/2018  . Dural sinus thrombosis 07/18/2018  . Current use of long term anticoagulation 11/18/2017  . Chronic pain of both knees 05/24/2017  . Cervical spondylosis without myelopathy 03/28/2017  . Pain in both hands 03/28/2017  . Thoracic aortic atherosclerosis ([place]) 03/04/2017  . Osteoarthritis 03/02/2017  . Community acquired pneumonia of left lower lobe of lung ([place]) 03/02/2017  . Vertigo 03/02/2017  . Lumbar radiculopathy 05/10/2015  . Lumbar degenerative disc disease 05/10/2015  . Cardiomyopathy ([place]) 04/30/2015  .  Mitral regurgitation 04/30/2015  . Arthritis of left hip 01/29/2015  . CAD (coronary artery disease) 05/21/2014  . Aortic sclerosis 04/09/2014  . History of pulmonary embolism 12/19/2013  . Diverticulitis of colon (without mention of hemorrhage)(562.11) 10/19/2013  . Diverticulitis of colon 08/26/2013  . Diastolic dysfunction 83/41/9622  . Pulmonary hypertension ([place]) 07/20/2013  . Hypokalemia 03/28/2013  . Paroxysmal atrial fibrillation ([place]) 02/03/2013  . Abnormal stress echo 01/18/2013  . Low back pain 12/05/2012  . Degenerative arthritis of left hip 12/05/2012  . Hard of hearing 08/28/2011  . TACHYCARDIA 07/23/2010  . ABNORMAL FINDINGS, ELEVATED BP W/O HTN 03/14/2007  . HYPERCHOLESTEROLEMIA 12/20/2006  . Hypothyroidism 11/09/2006    Past Surgical History:  Procedure Laterality Date  . ACNE CYST REMOVAL  2012   Back   . APPENDECTOMY    . CARDIAC CATHETERIZATION    . COLECTOMY  11/2013   [facility], 2nd to diverticulitis  . RT hip replacement    . TONSILLECTOMY         Home Medications    Prior to Admission medications   Medication Sig Start Date End Date Taking? Authorizing Provider  amoxicillin (AMOXIL) 500 MG capsule as needed (dental prophylaxis). 07/07/16   [provider]  amoxicillin-clavulanate (AUGMENTIN) 875-125 MG tablet Take 1 tablet by mouth 2 (two) times daily. 10/07/18   [name], PA  apixaban (ELIQUIS) 5 MG TABS tablet Take 5 mg by mouth 2 (two) times daily.  04/08/17  [provider]  carvedilol (COREG) 3.125 MG tablet Take 3.125 mg by mouth 2 (two) times daily with a meal. 09/21/18   [provider]  fluticasone (FLONASE) 50 MCG/ACT nasal spray Place into the nose. 07/09/15   [provider]  levothyroxine (SYNTHROID, LEVOTHROID) 75 MCG tablet TAKE 1 TABLET BY MOUTH ONCE DAILY BEFORE BREAKFAST, NEEDS FOLLOW UP APPOINTMENT FOR MORE REFILLS 04/04/18   [name], PA  lisinopril (PRINIVIL,ZESTRIL) 5 MG tablet  TAKE 1 TABLET BY MOUTH TWICE DAILY 07/11/18   [provider]  meclizine (ANTIVERT) 25 MG tablet Take by mouth. 01/15/17   [provider]  meloxicam (MOBIC) 15 MG tablet Take 15 mg by mouth daily. 08/01/18   [provider]  omeprazole (PRILOSEC OTC) 20 MG tablet Take by mouth.    [provider]  simvastatin (ZOCOR) 40 MG tablet TAKE 1 TABLET BY MOUTH ONCE DAILY AT 6 IN THE EVENING 08/29/18   [name], PA    Family History Family History  Problem Relation Age of Onset  . Heart disease Mother        pacemaker    Social History Social History   Tobacco Use  . Smoking status: Former Smoker    Packs/day: 1.00    Years: 15.00    Pack years: 15.00    Last attempt to quit: 12/21/1985    Years since quitting: 32.8  . Smokeless tobacco: Never Used  . Tobacco comment: pack a day   Substance Use Topics  . Alcohol use: No  . Drug use: No     Allergies   Tramadol; Flagyl [metronidazole]; Flomax [tamsulosin hcl]; and Lipitor [atorvastatin]   Review of Systems Review of Systems  All other systems reviewed and are negative.    Physical Exam Triage Vital Signs ED Triage Vitals  Enc Vitals Group     BP 10/14/18 1259 (!) 149/53     Pulse Rate 10/14/18 1259 100     Resp --      Temp 10/14/18 1259 98.1 F (36.7 C)     Temp Source 10/14/18 1259 Oral     SpO2 10/14/18 1259 96 %     Weight 10/14/18 1300 195 lb (88.5 kg)     Height --      Head Circumference --      Peak Flow --      Pain Score 10/14/18 1259 0     Pain Loc --      Pain Edu? --      Excl. in [place]? --    No data found.  Updated Vital Signs BP (!) 149/53 (BP Location: Right Arm)   Pulse 100   Temp 98.1 F (36.7 C) (Oral)   Wt 88.5 kg   SpO2 96%   BMI 27.98 kg/m   Visual Acuity Right Eye Distance:   Left Eye Distance:   Bilateral Distance:    Right Eye Near:   Left Eye Near:    Bilateral Near:     Physical Exam  Constitutional: He appears well-developed and  well-nourished. No distress.  HENT:  Head: Atraumatic.  Eyes: Pupils are equal, round, and reactive to light.  Cardiovascular:  Rate 100  Pulmonary/Chest: Effort normal.  Musculoskeletal:       Left hand: He exhibits laceration. He exhibits normal range of motion, no tenderness, no bony tenderness, normal two-point discrimination, normal capillary refill, no deformity and no swelling. Normal sensation noted. Normal strength noted.       Hands: 2.5cm  long triangular flap laceration dorsal surface of proximal phalanx of left fifth finger.  Neurological: He is alert.  Skin: Skin is warm and dry.  Nursing note and vitals reviewed.    UC Treatments / Results  Labs (all labs ordered are listed, but only abnormal results are displayed) Labs Reviewed - No data to display  EKG None  Radiology No results found.  Procedures Procedures  Laceration Repair Discussed benefits and risks of procedure and verbal consent obtained. Using sterile technique and local digital anesthesia with 2% lidocaine without epinephrine, cleansed wound with Betadine followed by copious lavage with normal saline.  Wound carefully inspected for debris and foreign bodies; none found.  Wound closed with #9, 5-0 interrupted nylon sutures.  Bacitracin and non-stick sterile dressing applied.  Wound precautions explained to patient.  Return for suture removal in 10 days.   Medications Ordered in UC Medications  Tdap (BOOSTRIX) injection 0.5 mL (0.5 mLs Intramuscular Given 10/14/18 1330)    Initial Impression / Assessment and Plan / UC Course  I have reviewed the triage vital signs and the nursing notes.  Pertinent labs & imaging results that were available during my care of the patient were reviewed by me and considered in my medical decision making (see chart for details).    Administered Tdap  Finish Augmentin  Final Clinical Impressions(s) / UC Diagnoses   Final diagnoses:  Laceration of left little finger  without foreign body without damage to nail, initial encounter     Discharge Instructions     Change dressing daily and apply Bacitracin ointment to wound.  Keep wound clean and dry.  Return for any signs of infection (or follow-up with family doctor):  Increasing redness, swelling, pain, heat, drainage, etc. Return in 10 days for suture removal.      ED Prescriptions    None        Kandra Nicolas, MD 10/16/18 [phone]

## 2018-10-14 NOTE — Discharge Instructions (Addendum)
Change dressing daily and apply Bacitracin ointment to wound.  Keep wound clean and dry.  Return for any signs of infection (or follow-up with family doctor):  Increasing redness, swelling, pain, heat, drainage, etc. °Return in 10 days for suture removal.   °

## 2018-10-14 NOTE — ED Triage Notes (Signed)
Pt c/o left pinky laceration that happened around 4pm yesterday. He was using pruning shears. Denies pain. Did not clean it with anything. Last tetanus was 2010.

## 2018-10-17 DIAGNOSIS — M180 Bilateral primary osteoarthritis of first carpometacarpal joints: Secondary | ICD-10-CM | POA: Diagnosis not present

## 2018-10-17 DIAGNOSIS — M79641 Pain in right hand: Secondary | ICD-10-CM | POA: Diagnosis not present

## 2018-10-17 DIAGNOSIS — M79642 Pain in left hand: Secondary | ICD-10-CM | POA: Diagnosis not present

## 2018-10-18 ENCOUNTER — Other Ambulatory Visit: Payer: Self-pay

## 2018-10-18 ENCOUNTER — Emergency Department (INDEPENDENT_AMBULATORY_CARE_PROVIDER_SITE_OTHER)
Admission: EM | Admit: 2018-10-18 | Discharge: 2018-10-18 | Disposition: A | Discharge status: Home / Self Care | Payer: Medicare HMO | Source: Home / Self Care | Attending: Family Medicine | Admitting: Family Medicine

## 2018-10-18 DIAGNOSIS — Z5189 Encounter for other specified aftercare: Secondary | ICD-10-CM

## 2018-10-18 DIAGNOSIS — T148XXA Other injury of unspecified body region, initial encounter: Secondary | ICD-10-CM

## 2018-10-18 DIAGNOSIS — L089 Local infection of the skin and subcutaneous tissue, unspecified: Secondary | ICD-10-CM

## 2018-10-18 MED ORDER — CEPHALEXIN 500 MG PO CAPS: 500.0000 mg | ORAL_CAPSULE | Freq: Two times a day (BID) | ORAL | 0 refills | Status: DC

## 2018-10-18 NOTE — ED Triage Notes (Signed)
Pt is here to have a wound check.  Had stitches placed in pinky finger last Friday.

## 2018-10-18 NOTE — ED Provider Notes (Signed)
[name] CARE    CSN: [id number] Arrival date & time: 10/18/18  1348     History   Chief Complaint Chief Complaint  Patient presents with  . Wound Check    HPI [name] is a 77 y.o. male.   HPI  [name] is a 77 y.o. male presenting to UC with concern for possible infection of Left little finger laceration that was sutured at [facility] on 10/14/18. Pt has mild redness and yellow drainage from one side of his laceration. The sutures are still in place. He was advised to have them removed in 10 days since placement. Denies fever or chills. He has been applying bacitracin and a bandage as advised. He completed a course of Augmentin 3 days ago, which he was taking for a UTI.   Past Medical History:  Diagnosis Date  . Abdominal wall defect, acquired   . Blockage of coronary artery of heart (HCC)    30%  . Hearing aid worn   . Insulin resistance   . LV dysfunction    reduction  . Osteoarthritis   . Pulmonary embolism [facility])     Patient Active Problem List   Diagnosis Date Noted  . TIA (transient ischemic attack) 07/18/2018  . Visual disturbance 07/18/2018  . Dural sinus thrombosis 07/18/2018  . Current use of long term anticoagulation 11/18/2017  . Chronic pain of both knees 05/24/2017  . Cervical spondylosis without myelopathy 03/28/2017  . Pain in both hands 03/28/2017  . Thoracic aortic atherosclerosis ([place]) 03/04/2017  . Osteoarthritis 03/02/2017  . Community acquired pneumonia of left lower lobe of lung ([place]) 03/02/2017  . Vertigo 03/02/2017  . Lumbar radiculopathy 05/10/2015  . Lumbar degenerative disc disease 05/10/2015  . Cardiomyopathy ([place]) 04/30/2015  . Mitral regurgitation 04/30/2015  . Arthritis of left hip 01/29/2015  . CAD (coronary artery disease) 05/21/2014  . Aortic sclerosis 04/09/2014  . History of pulmonary embolism 12/19/2013  . Diverticulitis of colon (without mention of hemorrhage)(562.11) 10/19/2013  . Diverticulitis  of colon 08/26/2013  . Diastolic dysfunction 36/64/4034  . Pulmonary hypertension ([place]) 07/20/2013  . Hypokalemia 03/28/2013  . Paroxysmal atrial fibrillation ([place]) 02/03/2013  . Abnormal stress echo 01/18/2013  . Low back pain 12/05/2012  . Degenerative arthritis of left hip 12/05/2012  . Hard of hearing 08/28/2011  . TACHYCARDIA 07/23/2010  . ABNORMAL FINDINGS, ELEVATED BP W/O HTN 03/14/2007  . HYPERCHOLESTEROLEMIA 12/20/2006  . Hypothyroidism 11/09/2006    Past Surgical History:  Procedure Laterality Date  . ACNE CYST REMOVAL  2012   Back   . APPENDECTOMY    . CARDIAC CATHETERIZATION    . COLECTOMY  11/2013   [facility], 2nd to diverticulitis  . RT hip replacement    . TONSILLECTOMY         Home Medications    Prior to Admission medications   Medication Sig Start Date End Date Taking? Authorizing Provider  amoxicillin (AMOXIL) 500 MG capsule as needed (dental prophylaxis). 07/07/16   [provider]  amoxicillin-clavulanate (AUGMENTIN) 875-125 MG tablet Take 1 tablet by mouth 2 (two) times daily. 10/07/18   [name], PA  apixaban (ELIQUIS) 5 MG TABS tablet Take 5 mg by mouth 2 (two) times daily.  04/08/17   [provider]  carvedilol (COREG) 3.125 MG tablet Take 3.125 mg by mouth 2 (two) times daily with a meal. 09/21/18   [provider]  cephALEXin (KEFLEX) 500 MG capsule Take 1 capsule (500 mg total) by mouth 2 (two)  times daily. 10/18/18   Noe Gens, PA-C  fluticasone (FLONASE) 50 MCG/ACT nasal spray Place into the nose. 07/09/15   [provider]  levothyroxine (SYNTHROID, LEVOTHROID) 75 MCG tablet TAKE 1 TABLET BY MOUTH ONCE DAILY BEFORE BREAKFAST, NEEDS FOLLOW UP APPOINTMENT FOR MORE REFILLS 04/04/18   [name], PA  lisinopril (PRINIVIL,ZESTRIL) 5 MG tablet TAKE 1 TABLET BY MOUTH TWICE DAILY 07/11/18   [provider]  meclizine (ANTIVERT) 25 MG tablet Take by mouth. 01/15/17   [provider]    meloxicam (MOBIC) 15 MG tablet Take 15 mg by mouth daily. 08/01/18   [provider]  omeprazole (PRILOSEC OTC) 20 MG tablet Take by mouth.    [provider]  simvastatin (ZOCOR) 40 MG tablet TAKE 1 TABLET BY MOUTH ONCE DAILY AT 6 IN THE EVENING 08/29/18   [name], PA    Family History Family History  Problem Relation Age of Onset  . Heart disease Mother        pacemaker    Social History Social History   Tobacco Use  . Smoking status: Former Smoker    Packs/day: 1.00    Years: 15.00    Pack years: 15.00    Last attempt to quit: 12/21/1985    Years since quitting: 32.8  . Smokeless tobacco: Never Used  . Tobacco comment: pack a day   Substance Use Topics  . Alcohol use: No  . Drug use: No     Allergies   Tramadol; Flagyl [metronidazole]; Flomax [tamsulosin hcl]; and Lipitor [atorvastatin]   Review of Systems Review of Systems  Constitutional: Negative for chills and fever.  Skin: Positive for color change and wound.     Physical Exam Triage Vital Signs ED Triage Vitals  Enc Vitals Group     BP 10/18/18 1408 132/71     Pulse Rate 10/18/18 1408 76     Resp --      Temp 10/18/18 1408 98 F (36.7 C)     Temp Source 10/18/18 1408 Oral     SpO2 10/18/18 1408 98 %     Weight --      Height --      Head Circumference --      Peak Flow --      Pain Score 10/18/18 1409 0     Pain Loc --      Pain Edu? --      Excl. in [place]? --    No data found.  Updated Vital Signs BP 132/71 (BP Location: Right Arm)   Pulse 76   Temp 98 F (36.7 C) (Oral)   Ht 5\' 10"  (1.778 m)   Wt 187 lb (84.8 kg)   SpO2 98%   BMI 26.83 kg/m   Visual Acuity Right Eye Distance:   Left Eye Distance:   Bilateral Distance:    Right Eye Near:   Left Eye Near:    Bilateral Near:     Physical Exam  Constitutional: He appears well-developed and well-nourished. No distress.  HENT:  Mouth/Throat: Oropharynx is clear and moist.  Cardiovascular: Normal rate.   Pulmonary/Chest: Effort normal. No respiratory distress.  Musculoskeletal: Normal range of motion. He exhibits no edema or tenderness.  Left little finger: full ROM  Skin: Skin is warm and dry. There is erythema.  Left little finger, proximal phalanx: well healing triangular shaped laceration with 9 sutures in place. Mild erythema along radial aspect of wound. Scant dried yellow discharge.   Nursing note  and vitals reviewed.    UC Treatments / Results  Labs (all labs ordered are listed, but only abnormal results are displayed) Labs Reviewed - No data to display  EKG None  Radiology No results found.  Procedures Procedures (including critical care time)  Medications Ordered in UC Medications - No data to display  Initial Impression / Assessment and Plan / UC Course  I have reviewed the triage vital signs and the nursing notes.  Pertinent labs & imaging results that were available during my care of the patient were reviewed by me and considered in my medical decision making (see chart for details).     Possible early cellulitis vs irritation from sutures. Will start on keflex and encouraged to continue home wound treatment.  Final Clinical Impressions(s) / UC Diagnoses   Final diagnoses:  Visit for wound check  Wound infection     Discharge Instructions      The wound appears to be healing well. You may start taking the prescribed keflex to help with the mild infection.  Keep wound clean with warm water and mild soap.  Keep covered with a bandage to help keep it protected and clean.  Please return in 5 days for suture removal and wound recheck.     ED Prescriptions    Medication Sig Dispense Auth. Provider   cephALEXin (KEFLEX) 500 MG capsule Take 1 capsule (500 mg total) by mouth 2 (two) times daily. 14 capsule Noe Gens, PA-C     Controlled Substance Prescriptions [place] Controlled Substance Registry consulted? Not Applicable   Tyrell Antonio 10/18/18 [id number]

## 2018-10-18 NOTE — Discharge Instructions (Signed)
°  The wound appears to be healing well. You may start taking the prescribed keflex to help with the mild infection.  Keep wound clean with warm water and mild soap.  Keep covered with a bandage to help keep it protected and clean.  Please return in 5 days for suture removal and wound recheck.

## 2018-10-19 ENCOUNTER — Ambulatory Visit: Payer: Medicare HMO | Admitting: Neurology

## 2018-10-19 ENCOUNTER — Encounter: Payer: Self-pay | Admitting: Neurology

## 2018-10-19 VITALS — BP 129/70 | HR 67 | Ht 70.0 in | Wt 187.0 lb

## 2018-10-19 DIAGNOSIS — I700 Atherosclerosis of aorta: Secondary | ICD-10-CM | POA: Diagnosis not present

## 2018-10-19 DIAGNOSIS — G9511 Acute infarction of spinal cord (embolic) (nonembolic): Secondary | ICD-10-CM | POA: Diagnosis not present

## 2018-10-19 DIAGNOSIS — H539 Unspecified visual disturbance: Secondary | ICD-10-CM | POA: Diagnosis not present

## 2018-10-19 DIAGNOSIS — Z86711 Personal history of pulmonary embolism: Secondary | ICD-10-CM | POA: Diagnosis not present

## 2018-10-19 DIAGNOSIS — Z7901 Long term (current) use of anticoagulants: Secondary | ICD-10-CM | POA: Diagnosis not present

## 2018-10-19 DIAGNOSIS — R200 Anesthesia of skin: Secondary | ICD-10-CM | POA: Insufficient documentation

## 2018-10-19 DIAGNOSIS — R339 Retention of urine, unspecified: Secondary | ICD-10-CM | POA: Insufficient documentation

## 2018-10-19 NOTE — Progress Notes (Signed)
[facility]  PATIENT: [name] DOB: May 06, 1941  REFERRING DOCTOR OR PCP:  [name], PA-C ([place]) SOURCE: Patient, notes from PCP, imaging and lab reports, MRI images personally reviewed.  _________________________________   HISTORICAL  CHIEF COMPLAINT:  Chief Complaint  Patient presents with  . Follow-up    RM 13 with wife. Last seen 07/18/18. Was seen in ED 09/04/18. Can see visit in care everywhere. He had received facet injections inj  in lumbar spine to help with back pain the Wednesday prior and was okay until that Saturday. He then started to have numb/ting in legs and lost function. They could not figure out reason for sx. R/o hematoma. They discussed possible dx of RIND. He would like to discuss this.     HISTORY OF PRESENT ILLNESS:  [name] is a 77 y.o. man with multiple neurologic issues  Update 10/19/2018: About 6 weeks ago 09/04/2018 he was seen in the ED.    He had had facet blocks a couple days earlier when he lost all sensation in both legs that came on over seconds to a minute.   The numbness improved after 30 minutes and then came back 30 minutes later and was worse with numbness/tight dysesthesia.   They went to the ED.   He walked in to the ED.    He was unable to give a urine sample and needed to be catheterized.  He went home with a catheter after spending a long time in the ED.    He saw Dr. Arrie Eastern who admitted him to [facility] and he had MRI thoracic not showing any spinal process.    By 2 days alter he was much better and his bladder control improved  Since the last visit, he had an MR Venogram confirming reduced flow in the righit sigmoid sinus/jugular vein.  He is already on Eliquis so no treatment is needed at this time but I would recommend Plavix if the Eliquis is ever stopped.  Multiple MRI images performed at [facility] hospital from September were reviewed in his presence.   Given his history, I was most  concerned about the thoracic spine.  Study was done with and without contrast and it is entirely normal except for a focus in the right T6 transverse process consistent with a hemangioma and unlikely to cause any symptoms.  The MRI of the lumbar spine shows multilevel degenerative changes including moderate spinal stenosis at L3-L4 and multilevel foraminal narrowing.  However, these changes are stable compared to his previous lumbar spine and would be unlikely to cause his symptoms.  The MRI of the brain did not show any acute findings.  I also reviewed a CT scan of the abdomen and pelvis performed 03/31/2013.  At that time, he had apical sclerosis in the distal aorta.  He had a 45 minute band in his vision, possibly due to a TIA , before his first visit with me.    MRA of the neck and head did not show any significant issue (mild left ICA stenosis only)     From 07/18/2018: He is a 77 yo man who had several episodes of half moon shaped visual effects on the left in 2013, sometimes with squiggly lines.    He saw ophthalmology an no problem was identified.   In 2016, he had a few more similar episodes.  They would last 30-60 seconds, sometimes less.   He did well until this year when he had several episodes  within 2 months.    The most recent episode was 06/29/2018 which was different.   He had an upper right visual defect with a gray band lasting 90 seconds.   This was the first one on the right and also involved graying of vision instead of squiggly lines.   He mentioned this to his PCP and an MRI of the brain was ordered.   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    It lifted and he felt back to normal.    He had a carotid Doppler study 5/16 80/2013 and it showed mild calcification at the bifurcation with mild stenosis  estimated to be less than 50%.  He is on Eliquis for Pulmonary embolism (small) an episode of paroxysmal atrial fibrillation and cardiomyopathy (has EF% reportedly 35%).    Of note, the only time he has been found to have A. fib was when he was hospitalized with severe pneumonia.   He sees Ulyses Southward [facility])  He has had problems with his haring on the left x 30+ years and more recently on his left.    He has had hearing aids x 6 years, though they do not help the left as much as the right.    I personally reviewed the MRI of the brain dated 07/13/2018.  There is mild age-appropriate cortical atrophy and very minimal chronic microvascular ischemic change.  There is abnormal signal in the sigmoid sinus on the right that could represent thrombosis.  This study was done without contrast.  REVIEW OF SYSTEMS: Constitutional: No fevers, chills, sweats, or change in appetite Eyes: No visual changes, double vision, eye pain Ear, nose and throat: He has left greater than the right hearing loss.  Sometimes he has dizziness. Cardiovascular: No chest pain, palpitations.   See above Respiratory: No shortness of breath at rest or with exertion.   No wheezes.  He has snoring. GastrointestinaI: No nausea, vomiting, diarrhea, abdominal pain, fecal incontinence Genitourinary: No dysuria, urinary retention or frequency.  No nocturia. Musculoskeletal: No neck pain, back pain Integumentary: No rash, pruritus, skin lesions Neurological: as above Psychiatric: No depression at this time.  No anxiety Endocrine: No palpitations, diaphoresis, change in appetite, change in weigh or increased thirst Hematologic/Lymphatic:He is on anticoagulation.  He bruises easily. Allergic/Immunologic: No itchy/runny eyes, nasal congestion, recent allergic reactions, rashes  ALLERGIES: Allergies  Allergen Reactions  . Tramadol Other (See Comments)    Dizziness  . Flagyl [Metronidazole] Nausea And Vomiting     States IV form causes to allergy  . Flomax [Tamsulosin Hcl] Other (See Comments)    Pre-syncope  . Lipitor [Atorvastatin] Other (See Comments)    Myalgia    HOME MEDICATIONS:  Current Outpatient Medications:  .  amoxicillin (AMOXIL) 500 MG capsule, as needed (dental prophylaxis)., Disp: , Rfl:  .  apixaban (ELIQUIS) 5 MG TABS tablet, Take 5 mg by mouth 2 (two) times daily. , Disp: , Rfl:  .  carvedilol (COREG) 3.125 MG tablet, Take 3.125 mg by mouth 2 (two) times daily with a meal., Disp: , Rfl: 3 .  cephALEXin (KEFLEX) 500 MG capsule, Take 1 capsule (500 mg total) by mouth 2 (two) times daily., Disp: 14 capsule, Rfl: 0 .  fluticasone (FLONASE) 50 MCG/ACT nasal spray,  Place into the nose., Disp: , Rfl:  .  levothyroxine (SYNTHROID, LEVOTHROID) 75 MCG tablet, TAKE 1 TABLET BY MOUTH ONCE DAILY BEFORE BREAKFAST, NEEDS FOLLOW UP APPOINTMENT FOR MORE REFILLS, Disp: 90 tablet, Rfl: 1 .  lisinopril (PRINIVIL,ZESTRIL) 5 MG tablet, TAKE 1 TABLET BY MOUTH TWICE DAILY, Disp: , Rfl:  .  meclizine (ANTIVERT) 25 MG tablet, Take by mouth., Disp: , Rfl:  .  meloxicam (MOBIC) 15 MG tablet, Take 15 mg by mouth daily., Disp: , Rfl: 1 .  omeprazole (PRILOSEC OTC) 20 MG tablet, Take by mouth., Disp: , Rfl:  .  simvastatin (ZOCOR) 40 MG tablet, TAKE 1 TABLET BY MOUTH ONCE DAILY AT 6 IN THE EVENING, Disp: 90 tablet, Rfl: 0  PAST MEDICAL HISTORY: Past Medical History:  Diagnosis Date  . Abdominal wall defect, acquired   . Blockage of coronary artery of heart (HCC)    30%  . Hearing aid worn   . Insulin resistance   . LV dysfunction    reduction  . Osteoarthritis   . Pulmonary embolism ([place])     PAST SURGICAL HISTORY: Past Surgical History:  Procedure Laterality Date  . ACNE CYST REMOVAL  2012   Back   . APPENDECTOMY    . CARDIAC CATHETERIZATION    . COLECTOMY  11/2013   [facility], 2nd to diverticulitis  . RT hip replacement    . TONSILLECTOMY      FAMILY HISTORY: Family History  Problem  Relation Age of Onset  . Heart disease Mother        pacemaker    SOCIAL HISTORY:  Social History   Socioeconomic History  . Marital status: Married    Spouse name: Not on file  . Number of children: Not on file  . Years of education: Not on file  . Highest education level: Not on file  Occupational History  . Occupation: Doctor, hospital    Comment: wedding cakes, self imployed  Social Needs  . Financial resource strain: Not on file  . Food insecurity:    Worry: Not on file    Inability: Not on file  . Transportation needs:    Medical: Not on file    Non-medical: Not on file  Tobacco Use  . Smoking status: Former Smoker    Packs/day: 1.00    Years: 15.00    Pack years: 15.00    Last attempt to quit: 12/21/1985    Years since quitting: 32.8  . Smokeless tobacco: Never Used  . Tobacco comment: pack a day   Substance and Sexual Activity  . Alcohol use: No  . Drug use: No  . 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 Physical activity:    Days per week: Not on file    Minutes per session: Not on file  . Stress: Not on file  Relationships  . Social connections:    Talks on phone: Not on file    Gets together: Not on file    Attends religious service: Not on file    Active member of club or organization: Not on file    Attends meetings of clubs or organizations: Not on file    Relationship status: Not on file  . Intimate partner violence:    Fear of current or ex partner: Not on file    Emotionally abused: Not on file    Physically abused: Not on file  Forced sexual activity: Not on file  Other Topics Concern  . Not on file  Social History Narrative   Born in [place].      PHYSICAL EXAM  Vitals:   10/19/18 1106  BP: 129/70  Pulse: 67  Weight: 187 lb (84.8 kg)  Height: 5\' 10"  (1.778 m)    Body mass index is 26.83 kg/m.   General: The patient is well-developed and well-nourished  and in no acute distress   Cardiovascular: The heart has a regular rate and rhythm with a normal S1 and S2. There were no murmurs, gallops or rubs.     Neurologic Exam  Mental status: The patient is alert and oriented x 3 at the time of the examination. The patient has apparent normal recent and remote memory, with an apparently normal attention span and concentration ability.   Speech is normal.  Cranial nerves: Extraocular movements are full. Pupils are equal, round, and reactive to light and accomodation.  Visual fields are full.  Facial symmetry is present. There is good facial sensation to soft touch bilaterally.Facial strength is normal.  Trapezius and sternocleidomastoid strength is normal. No dysarthria is noted.  The tongue is midline, and the patient has symmetric elevation of the soft palate. No obvious hearing deficits are noted.  Motor:  Muscle bulk is normal.   Tone is normal. Strength is  5 / 5 in all 4 extremities.   Sensory: Sensory testing is intact to pinprick, soft touch and vibration sensation in the arms.  He felt touch and temperature better in the arms than the legs and he had a subtle temperature sensory level bilaterally at T11..  Proprioception was normal.  Vibration was normal in the arms and legs  Coordination: Cerebellar testing reveals good finger-nose-finger and heel-to-shin bilaterally.  Gait and station: Station is normal.   Gait is normal. Tandem gait is normal for age. Romberg is negative.   Reflexes: Deep tendon reflexes are symmetric and normal bilaterally.   Plantar responses are flexor.    DIAGNOSTIC DATA (LABS, IMAGING, TESTING) - I reviewed patient records, labs, notes, testing and imaging myself where available.  Lab Results  Component Value Date   WBC 9.6 10/07/2018   HGB 12.4 (L) 10/07/2018   HCT 36.5 (L) 10/07/2018   MCV 89.4 10/07/2018   PLT 207.0 10/07/2018      Component Value Date/Time   NA 141 10/07/2018 1056   K 4.4 10/07/2018  1056   CL 109 10/07/2018 1056   CO2 24 10/07/2018 1056   GLUCOSE 106 (H) 10/07/2018 1056   BUN 22 10/07/2018 1056   CREATININE 1.18 10/07/2018 1056   CREATININE 1.02 03/09/2018 0838   CALCIUM 8.9 10/07/2018 1056   PROT 6.2 03/09/2018 0838   ALBUMIN 4.3 09/14/2017 1143   AST 20 03/09/2018 0838   ALT 11 03/09/2018 0838   ALKPHOS 42 09/14/2017 1143   BILITOT 0.7 03/09/2018 0838   GFRNONAA 89 01/29/2016 0805   GFRAA >89 01/29/2016 0805   Lab Results  Component Value Date   CHOL 151 03/09/2018   HDL 39 (L) 03/09/2018   LDLCALC 93 03/09/2018   LDLDIRECT 168 (H) 11/23/2006   TRIG 98 03/09/2018   CHOLHDL 3.9 03/09/2018   Lab Results  Component Value Date   HGBA1C 5.5 07/08/2016   Lab Results  Component Value Date   VITAMINB12 351 10/07/2018   Lab Results  Component Value Date   TSH 1.46 03/09/2018       ASSESSMENT AND PLAN  Spinal cord infarction ([place])  Visual disturbance  Current use of long term anticoagulation  History of pulmonary embolism  Numbness  Urinary retention  Thoracic aortic atherosclerosis ([place])  1.   The etiology of his recent numbness, urinary retention and reduced proprioception episode is unclear.  However, a lower thoracic issue would best explain the symptoms.   He does continue to have a slight temperature sensation level at T11 but no other symptoms or signs and I believe the most likely explanation is a very small spinal cord infarction.  Perhaps being off of the Eliquis a few days earlier for 1 day increased the likelihood of such an event.   Additionally, he has known athero-sclerosis in the distal aorta which could have been a source of embolus to the artery of Adamkiewicz or other spinal supply.  I recommend that he continue on Eliquis. 2.    He is advised to call if any new or worsening neurologic symptoms and will return to see me in a few months for reevaluation.  45-minute face-to-face evaluation with greater than one half the time  counseling and coordinating care about his recent event and implications.  Ronnett Pullin A. Felecia Shelling, MD, [facility] 36/14/4315, 4:00 PM Certified in Neurology, Clinical Neurophysiology, Sleep Medicine, Pain Medicine and Neuroimaging  [facility] [address] [place], [place] [address] [phone]

## 2018-10-24 ENCOUNTER — Ambulatory Visit (INDEPENDENT_AMBULATORY_CARE_PROVIDER_SITE_OTHER): Payer: Medicare HMO | Admitting: Physician Assistant

## 2018-10-24 ENCOUNTER — Encounter: Payer: Self-pay | Admitting: Physician Assistant

## 2018-10-24 VITALS — BP 126/60 | HR 76 | Temp 98.1°F | Ht 70.0 in | Wt 185.5 lb

## 2018-10-24 DIAGNOSIS — E538 Deficiency of other specified B group vitamins: Secondary | ICD-10-CM

## 2018-10-24 DIAGNOSIS — R7309 Other abnormal glucose: Secondary | ICD-10-CM | POA: Diagnosis not present

## 2018-10-24 DIAGNOSIS — R6889 Other general symptoms and signs: Secondary | ICD-10-CM | POA: Diagnosis not present

## 2018-10-24 LAB — COMPREHENSIVE METABOLIC PANEL
ALK PHOS: 40 U/L (ref 39–117)
ALT: 15 U/L (ref 0–53)
AST: 19 U/L (ref 0–37)
Albumin: 4.4 g/dL (ref 3.5–5.2)
BILIRUBIN TOTAL: 0.7 mg/dL (ref 0.2–1.2)
BUN: 28 mg/dL — ABNORMAL HIGH (ref 6–23)
CO2: 24 mEq/L (ref 19–32)
CREATININE: 1.23 mg/dL (ref 0.40–1.50)
Calcium: 9 mg/dL (ref 8.4–10.5)
Chloride: 108 mEq/L (ref 96–112)
GFR: 60.71 mL/min (ref >60.00)
GLUCOSE: 127 mg/dL — AB (ref 70–99)
Potassium: 4 mEq/L (ref 3.5–5.1)
Sodium: 141 mEq/L (ref 135–145)
TOTAL PROTEIN: 6.4 g/dL (ref 6.0–8.3)

## 2018-10-24 LAB — CBC WITH DIFFERENTIAL/PLATELET
BASOS PCT: 0.9 % (ref 0.0–3.0)
Basophils Absolute: 0.1 10*3/uL (ref 0.0–0.1)
EOS PCT: 1.3 % (ref 0.0–5.0)
Eosinophils Absolute: 0.1 10*3/uL (ref 0.0–0.7)
HEMATOCRIT: 38.6 % — AB (ref 39.0–52.0)
Hemoglobin: 13 g/dL (ref 13.0–17.0)
LYMPHS ABS: 1.5 10*3/uL (ref 0.7–4.0)
Lymphocytes Relative: 18.2 % (ref 12.0–46.0)
MCHC: 33.8 g/dL (ref 30.0–36.0)
MCV: 89.2 fl (ref 78.0–100.0)
MONO ABS: 0.7 10*3/uL (ref 0.1–1.0)
MONOS PCT: 8.6 % (ref 3.0–12.0)
NEUTROS ABS: 5.9 10*3/uL (ref 1.4–7.7)
NEUTROS PCT: 71 % (ref 43.0–77.0)
Platelets: 239 10*3/uL (ref 150.0–400.0)
RBC: 4.33 Mil/uL (ref 4.22–5.81)
RDW: 14.2 % (ref 11.5–15.5)
WBC: 8.4 10*3/uL (ref 4.0–10.5)

## 2018-10-24 LAB — HEMOGLOBIN A1C: HEMOGLOBIN A1C: 6.2 % (ref 4.6–6.5)

## 2018-10-24 LAB — T4, FREE: Free T4: 0.86 ng/dL (ref 0.60–1.60)

## 2018-10-24 LAB — TSH: TSH: 1.1 u[IU]/mL (ref 0.35–4.50)

## 2018-10-24 MED ORDER — CYANOCOBALAMIN 1000 MCG/ML IJ SOLN
1000.0000 ug | Freq: Once | INTRAMUSCULAR | Status: AC
  Administered 2018-10-24: 1000 ug via INTRAMUSCULAR

## 2018-10-24 NOTE — Patient Instructions (Signed)
We will call you with your lab results.  Please try to be more intentional with eating and drinking throughout the day.  Consider talking to your cardiologist about your coreg.  Follow-up in 2 weeks to re-check in with me, sooner if concerns.

## 2018-10-24 NOTE — Progress Notes (Signed)
[name] is a 76 y.o. male is here to discuss: Weight loss  I acted as a Education administrator for [employer], PA-C Anselmo Pickler, LPN  History of Present Illness:   Chief Complaint  Patient presents with  . Weight Loss    HPI   Patient is here to have his unintentional weight loss evaluated.  Per chart review he has last at most 10 lb over the past year or so. His weight fluctuates over the past year from 185 - 195 lb. He endorses nausea and early satiety. He has been worked up by Dr. Ferdinand Lango with [facility] regarding this. He had a CXR, CT scan and EGD all reportedly normal. Labs have also been checked. He was told to try to do small frequent meals but he hasn't really tried that. He was also told to try Ensure or other shakes but he does not like those. His wife states that he has been trying to eat high calorie foods to help with his weight.  Normal BM's. Denies blood in bowels. Feels like he gets full easily. States that since he has switched from metoprolol to coreg he has had some issues with feeling "off." He thinks that his blood pressure may be getting low.  Breakfast: english muffin and boiled egg, coffee Lunch: deli sandwich OR chicken biscuit; chips; water and 1/2 bottle of regular coke Dinner: largest meal, good protein source; water No alcohol   Recent snacks -- piece of apple pie or slice of cheesecake  I have reviewed his labs, EKG, abdominal u/s, CT scan of abdomen. Most recent note from GI Dr. Ferdinand Lango on 09/01/18 shows "no clear explanation of his symptoms."  Takes his thyroid medication regularly on an empty stomach.  He is also here for Vit B12 injection.  Wt Readings from Last 15 Encounters:  10/24/18 185 lb 8 oz (84.1 kg)  10/19/18 187 lb (84.8 kg)  10/18/18 187 lb (84.8 kg)  10/14/18 195 lb (88.5 kg)  10/07/18 186 lb 6.1 oz (84.5 kg)  09/15/18 182 lb 6.1 oz (82.7 kg)  07/18/18 192 lb (87.1 kg)  07/05/18 189 lb 4 oz (85.8 kg)  06/15/18 188 lb (85.3 kg)   06/06/18 191 lb (86.6 kg)  05/20/18 191 lb (86.6 kg)  03/24/18 191 lb 8 oz (86.9 kg)  03/07/18 191 lb 9.6 oz (86.9 kg)  09/14/17 193 lb (87.5 kg)  08/30/17 193 lb 8 oz (87.8 kg)      There are no preventive care reminders to display for this patient.  Past Medical History:  Diagnosis Date  . Abdominal wall defect, acquired   . Blockage of coronary artery of heart (HCC)    30%  . Hearing aid worn   . Insulin resistance   . LV dysfunction    reduction  . Osteoarthritis   . Pulmonary embolism [facility])      Social History   Socioeconomic History  . Marital status: Married    Spouse name: Not on file  . Number of children: Not on file  . Years of education: Not on file  . Highest education level: Not on file  Occupational History  . Occupation: Doctor, hospital    Comment: wedding cakes, self imployed  Social Needs  . Financial resource strain: Not on file  . Food insecurity:    Worry: Not on file    Inability: Not on file  . Transportation needs:    Medical: Not on file    Non-medical: Not  on file  Tobacco Use  . Smoking status: Former Smoker    Packs/day: 1.00    Years: 15.00    Pack years: 15.00    Last attempt to quit: 12/21/1985    Years since quitting: 32.8  . Smokeless tobacco: Never Used  . Tobacco comment: pack a day   Substance and Sexual Activity  . Alcohol use: No  . Drug use: No  . 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 Physical activity:    Days per week: Not on file    Minutes per session: Not on file  . Stress: Not on file  Relationships  . Social connections:    Talks on phone: Not on file    Gets together: Not on file    Attends religious service: Not on file    Active member of club or organization: Not on file    Attends meetings of clubs or organizations: Not on file    Relationship status: Not on file  . Intimate partner violence:    Fear of current or ex  partner: Not on file    Emotionally abused: Not on file    Physically abused: Not on file    Forced sexual activity: Not on file  Other Topics Concern  . Not on file  Social History Narrative   Born in [place].     Past Surgical History:  Procedure Laterality Date  . ACNE CYST REMOVAL  2012   Back   . APPENDECTOMY    . CARDIAC CATHETERIZATION    . COLECTOMY  11/2013   [facility], 2nd to diverticulitis  . RT hip replacement    . TONSILLECTOMY      Family History  Problem Relation Age of Onset  . Heart disease Mother        pacemaker    PMHx, SurgHx, SocialHx, FamHx, Medications, and Allergies were reviewed in the Visit Navigator and updated as appropriate.   Patient Active Problem List   Diagnosis Date Noted  . Spinal cord infarction ([place]) 10/19/2018  . Numbness 10/19/2018  . Urinary retention 10/19/2018  . TIA (transient ischemic attack) 07/18/2018  . Visual disturbance 07/18/2018  . Dural sinus thrombosis 07/18/2018  . Current use of long term anticoagulation 11/18/2017  . Chronic pain of both knees 05/24/2017  . Cervical spondylosis without myelopathy 03/28/2017  . Pain in both hands 03/28/2017  . Thoracic aortic atherosclerosis ([place]) 03/04/2017  . Osteoarthritis 03/02/2017  . Community acquired pneumonia of left lower lobe of lung ([place]) 03/02/2017  . Vertigo 03/02/2017  . Lumbar radiculopathy 05/10/2015  . Lumbar degenerative disc disease 05/10/2015  . Cardiomyopathy ([place]) 04/30/2015  . Mitral regurgitation 04/30/2015  . Arthritis of left hip 01/29/2015  . CAD (coronary artery disease) 05/21/2014  . Aortic sclerosis 04/09/2014  . History of pulmonary embolism 12/19/2013  . Diverticulitis of colon (without mention of hemorrhage)(562.11) 10/19/2013  . Diverticulitis of colon 08/26/2013  . Diastolic dysfunction 76/22/6333  . Pulmonary hypertension ([place]) 07/20/2013  . Hypokalemia 03/28/2013  . Paroxysmal atrial fibrillation ([place]) 02/03/2013  . Abnormal stress  echo 01/18/2013  . Low back pain 12/05/2012  . Degenerative arthritis of left hip 12/05/2012  . Hard of hearing 08/28/2011  . TACHYCARDIA 07/23/2010  . ABNORMAL FINDINGS, ELEVATED BP W/O HTN 03/14/2007  . HYPERCHOLESTEROLEMIA 12/20/2006  . Hypothyroidism 11/09/2006    Social History   Tobacco Use  . Smoking status: Former Smoker  Packs/day: 1.00    Years: 15.00    Pack years: 15.00    Last attempt to quit: 12/21/1985    Years since quitting: 32.8  . Smokeless tobacco: Never Used  . Tobacco comment: pack a day   Substance Use Topics  . Alcohol use: No  . Drug use: No    Current Medications and Allergies:    Current Outpatient Medications:  .  apixaban (ELIQUIS) 5 MG TABS tablet, Take 5 mg by mouth 2 (two) times daily. , Disp: , Rfl:  .  carvedilol (COREG) 3.125 MG tablet, Take 3.125 mg by mouth 2 (two) times daily with a meal., Disp: , Rfl: 3 .  cephALEXin (KEFLEX) 500 MG capsule, Take 1 capsule (500 mg total) by mouth 2 (two) times daily., Disp: 14 capsule, Rfl: 0 .  fluticasone (FLONASE) 50 MCG/ACT nasal spray, Place into the nose., Disp: , Rfl:  .  levothyroxine (SYNTHROID, LEVOTHROID) 75 MCG tablet, TAKE 1 TABLET BY MOUTH ONCE DAILY BEFORE BREAKFAST, NEEDS FOLLOW UP APPOINTMENT FOR MORE REFILLS, Disp: 90 tablet, Rfl: 1 .  lisinopril (PRINIVIL,ZESTRIL) 5 MG tablet, TAKE 1 TABLET BY MOUTH TWICE DAILY, Disp: , Rfl:  .  meclizine (ANTIVERT) 25 MG tablet, Take by mouth., Disp: , Rfl:  .  meloxicam (MOBIC) 15 MG tablet, Take 15 mg by mouth daily., Disp: , Rfl: 1 .  omeprazole (PRILOSEC OTC) 20 MG tablet, Take by mouth., Disp: , Rfl:  .  simvastatin (ZOCOR) 40 MG tablet, TAKE 1 TABLET BY MOUTH ONCE DAILY AT 6 IN THE EVENING, Disp: 90 tablet, Rfl: 0   Allergies  Allergen Reactions  . Tramadol Other (See Comments)    Dizziness  . Flagyl [Metronidazole] Nausea And Vomiting    States IV form causes to allergy  . Flomax [Tamsulosin Hcl] Other (See Comments)    Pre-syncope  .  Lipitor [Atorvastatin] Other (See Comments)    Myalgia    Review of Systems   ROS  Negative unless otherwise specified per HPI.   Vitals:   Vitals:   10/24/18 1047  BP: 126/60  Pulse: 76  Temp: 98.1 F (36.7 C)  TempSrc: Oral  SpO2: 97%  Weight: 185 lb 8 oz (84.1 kg)  Height: 5\' 10"  (1.778 m)     Body mass index is 26.62 kg/m.   Physical Exam:    Physical Exam  Constitutional: He appears well-developed. He is cooperative.  Non-toxic appearance. He does not have a sickly appearance. He does not appear ill. No distress.  Cardiovascular: Normal rate, regular rhythm, S1 normal, S2 normal, normal heart sounds and normal pulses.  No LE edema  Pulmonary/Chest: Effort normal and breath sounds normal.  Abdominal: Normal appearance and bowel sounds are normal. There is no tenderness.  Neurological: He is alert. GCS eye subscore is 4. GCS verbal subscore is 5. GCS motor subscore is 6.  Skin: Skin is warm, dry and intact.  Psychiatric: He has a normal mood and affect. His speech is normal and behavior is normal.  Nursing note and vitals reviewed.    Assessment and Plan:    [name] was seen today for weight loss.  Diagnoses and all orders for this visit:  Unintentional weight change; Elevated glucose level Will re-check TSH as well as CBC and CMP. He has had some intermittent elevated glucose and HgbA1c has not been checked within past two years -- will check today. Discussed need for compliance with small, frequent meals. Push fluids and try to consume shakes or smoothies  between meals, or nutrient dense snacks. Handout provided. Follow-up in 2 weeks. Will treat any lab abnormalities. May need to return to GI for further symptom work-up. If he suspects symptoms are related to BP medication, I recommended that he contact cards. -     CBC with Differential/Platelet -     Comprehensive metabolic panel -     TSH -     T4, free -     Hemoglobin A1c  B12 deficiency -      cyanocobalamin ((VITAMIN B-12)) injection 1,000 mcg  . Reviewed expectations re: course of current medical issues. . Discussed self-management of symptoms. . Outlined signs and symptoms indicating need for more acute intervention. . Patient verbalized understanding and all questions were answered. . See orders for this visit as documented in the electronic medical record. . Patient received an After Visit Summary.  CMA or LPN served as scribe during this visit. History, Physical, and Plan performed by medical provider. The above documentation has been reviewed and is accurate and complete.  [name], PA-C [place] [place] 10/24/2018  Follow-up: No follow-ups on file.

## 2018-10-25 ENCOUNTER — Other Ambulatory Visit: Payer: Self-pay

## 2018-10-25 ENCOUNTER — Other Ambulatory Visit: Payer: Self-pay | Admitting: Physician Assistant

## 2018-10-25 ENCOUNTER — Emergency Department (INDEPENDENT_AMBULATORY_CARE_PROVIDER_SITE_OTHER)
Admission: EM | Admit: 2018-10-25 | Discharge: 2018-10-25 | Disposition: A | Discharge status: Home / Self Care | Payer: Medicare HMO | Source: Home / Self Care | Attending: Family Medicine | Admitting: Family Medicine

## 2018-10-25 DIAGNOSIS — R6889 Other general symptoms and signs: Secondary | ICD-10-CM

## 2018-10-25 DIAGNOSIS — Z4802 Encounter for removal of sutures: Secondary | ICD-10-CM

## 2018-10-25 DIAGNOSIS — R7309 Other abnormal glucose: Secondary | ICD-10-CM

## 2018-10-25 MED ORDER — MUPIROCIN 2 % EX OINT: 1.0000 "application " | TOPICAL_OINTMENT | Freq: Three times a day (TID) | CUTANEOUS | 0 refills | Status: DC

## 2018-10-25 NOTE — ED Triage Notes (Signed)
Pt came into to clinic for possible removal of stitches (9) 5th digit of Left hand.

## 2018-10-25 NOTE — Discharge Instructions (Addendum)
Change dressing 3 times daily and apply mupirocin ointment to wound.  Keep wound clean and dry.  Return for any signs of infection (or follow-up with family doctor):  Increasing redness, swelling, pain, heat, drainage, etc.

## 2018-10-25 NOTE — ED Provider Notes (Signed)
[name] CARE    CSN: [id number] Arrival date & time: 10/25/18  1114     History   Chief Complaint Chief Complaint  Patient presents with  . other    Stitches 5th digit Left hand    HPI [name] is a 77 y.o. male.   Patient returns for suture removal with no complaints.  The history is provided by the patient.    Past Medical History:  Diagnosis Date  . Abdominal wall defect, acquired   . Blockage of coronary artery of heart (HCC)    30%  . Hearing aid worn   . Insulin resistance   . LV dysfunction    reduction  . Osteoarthritis   . Pulmonary embolism [facility])     Patient Active Problem List   Diagnosis Date Noted  . Spinal cord infarction ([place]) 10/19/2018  . Numbness 10/19/2018  . Urinary retention 10/19/2018  . TIA (transient ischemic attack) 07/18/2018  . Visual disturbance 07/18/2018  . Dural sinus thrombosis 07/18/2018  . Current use of long term anticoagulation 11/18/2017  . Chronic pain of both knees 05/24/2017  . Cervical spondylosis without myelopathy 03/28/2017  . Pain in both hands 03/28/2017  . Thoracic aortic atherosclerosis ([place]) 03/04/2017  . Osteoarthritis 03/02/2017  . Community acquired pneumonia of left lower lobe of lung ([place]) 03/02/2017  . Vertigo 03/02/2017  . Lumbar radiculopathy 05/10/2015  . Lumbar degenerative disc disease 05/10/2015  . Cardiomyopathy ([place]) 04/30/2015  . Mitral regurgitation 04/30/2015  . Arthritis of left hip 01/29/2015  . CAD (coronary artery disease) 05/21/2014  . Aortic sclerosis 04/09/2014  . History of pulmonary embolism 12/19/2013  . Diverticulitis of colon (without mention of hemorrhage)(562.11) 10/19/2013  . Diverticulitis of colon 08/26/2013  . Diastolic dysfunction 56/43/3295  . Pulmonary hypertension ([place]) 07/20/2013  . Hypokalemia 03/28/2013  . Paroxysmal atrial fibrillation ([place]) 02/03/2013  . Abnormal stress echo 01/18/2013  . Low back pain 12/05/2012  . Degenerative  arthritis of left hip 12/05/2012  . Hard of hearing 08/28/2011  . TACHYCARDIA 07/23/2010  . ABNORMAL FINDINGS, ELEVATED BP W/O HTN 03/14/2007  . HYPERCHOLESTEROLEMIA 12/20/2006  . Hypothyroidism 11/09/2006    Past Surgical History:  Procedure Laterality Date  . ACNE CYST REMOVAL  2012   Back   . APPENDECTOMY    . CARDIAC CATHETERIZATION    . COLECTOMY  11/2013   [facility], 2nd to diverticulitis  . RT hip replacement    . TONSILLECTOMY         Home Medications    Prior to Admission medications   Medication Sig Start Date End Date Taking? Authorizing Provider  apixaban (ELIQUIS) 5 MG TABS tablet Take 5 mg by mouth 2 (two) times daily.  04/08/17   [provider]  carvedilol (COREG) 3.125 MG tablet Take 3.125 mg by mouth 2 (two) times daily with a meal. 09/21/18   [provider]  cephALEXin (KEFLEX) 500 MG capsule Take 1 capsule (500 mg total) by mouth 2 (two) times daily. 10/18/18   Noe Gens, PA-C  fluticasone (FLONASE) 50 MCG/ACT nasal spray Place into the nose. 07/09/15   [provider]  levothyroxine (SYNTHROID, LEVOTHROID) 75 MCG tablet TAKE 1 TABLET BY MOUTH ONCE DAILY BEFORE BREAKFAST, NEEDS FOLLOW UP APPOINTMENT FOR MORE REFILLS 04/04/18   [name], PA  lisinopril (PRINIVIL,ZESTRIL) 5 MG tablet TAKE 1 TABLET BY MOUTH TWICE DAILY 07/11/18   [provider]  meclizine (ANTIVERT) 25 MG tablet Take by mouth. 01/15/17   [provider]  mupirocin ointment (BACTROBAN) 2 % Apply 1 application topically 3 (three) times daily. 10/25/18   Kandra Nicolas, MD  simvastatin (ZOCOR) 40 MG tablet TAKE 1 TABLET BY MOUTH ONCE DAILY AT 6 IN THE EVENING 08/29/18   [name], PA    Family History Family History  Problem Relation Age of Onset  . Heart disease Mother        pacemaker    Social History Social History   Tobacco Use  . Smoking status: Former Smoker    Packs/day: 1.00    Years: 15.00    Pack years: 15.00    Last  attempt to quit: 12/21/1985    Years since quitting: 32.8  . Smokeless tobacco: Never Used  . Tobacco comment: pack a day   Substance Use Topics  . Alcohol use: No  . Drug use: No     Allergies   Tramadol; Flagyl [metronidazole]; Flomax [tamsulosin hcl]; and Lipitor [atorvastatin]   Review of Systems Review of Systems  Patient denies pain, drainage, or swelling from laceration left 5th finger.  Physical Exam Triage Vital Signs ED Triage Vitals  Enc Vitals Group     BP 10/25/18 1134 121/82     Pulse Rate 10/25/18 1134 77     Resp 10/25/18 1145 18     Temp 10/25/18 1134 97.6 F (36.4 C)     Temp Source 10/25/18 1134 Oral     SpO2 10/25/18 1134 97 %     Weight 10/25/18 1135 186 lb 12.8 oz (84.7 kg)     Height 10/25/18 1135 5' 10.5" (1.791 m)     Head Circumference --      Peak Flow --      Pain Score 10/25/18 1135 0     Pain Loc --      Pain Edu? --      Excl. in [place]? --    No data found.  Updated Vital Signs BP 121/82 (BP Location: Right Arm)   Pulse 77   Temp 97.6 F (36.4 C) (Oral)   Resp 18   Ht 5' 10.5" (1.791 m)   Wt 84.7 kg   SpO2 97%   BMI 26.42 kg/m   Visual Acuity Right Eye Distance:   Left Eye Distance:   Bilateral Distance:    Right Eye Near:   Left Eye Near:    Bilateral Near:     Physical Exam Nursing notes and Vital Signs reviewed. Appearance:  Patient appears stated age, and in no acute distress Left fifth finger:  Laceration appears to be healing well without tenderness or drainage.  There is persistent erythema around wound.  Sutures removed without difficulty.  Applied Bacitracin and bandage.  UC Treatments / Results  Labs (all labs ordered are listed, but only abnormal results are displayed) Labs Reviewed - No data to display  EKG None  Radiology No results found.  Procedures Procedures (including critical care time)  Medications Ordered in UC Medications - No data to display  Initial Impression / Assessment and Plan /  UC Course  I have reviewed the triage vital signs and the nursing notes.  Pertinent labs & imaging results that were available during my care of the patient were reviewed by me and considered in my medical decision making (see chart for details).     Begin mupirocin ointment daily for 5 to 7 days.  Final Clinical Impressions(s) / UC Diagnoses   Final diagnoses:  Visit for suture removal  Discharge Instructions     Change dressing 3 times daily and apply mupirocin ointment to wound.  Keep wound clean and dry.  Return for any signs of infection (or follow-up with family doctor):  Increasing redness, swelling, pain, heat, drainage, etc.      ED Prescriptions    Medication Sig Dispense Auth. Provider   mupirocin ointment (BACTROBAN) 2 % Apply 1 application topically 3 (three) times daily. 15 g Kandra Nicolas, MD        Kandra Nicolas, MD 10/25/18 [phone]

## 2018-11-01 DIAGNOSIS — R5383 Other fatigue: Secondary | ICD-10-CM | POA: Diagnosis not present

## 2018-11-01 DIAGNOSIS — I499 Cardiac arrhythmia, unspecified: Secondary | ICD-10-CM | POA: Diagnosis not present

## 2018-11-01 DIAGNOSIS — E7800 Pure hypercholesterolemia, unspecified: Secondary | ICD-10-CM | POA: Diagnosis not present

## 2018-11-01 DIAGNOSIS — I2510 Atherosclerotic heart disease of native coronary artery without angina pectoris: Secondary | ICD-10-CM | POA: Diagnosis not present

## 2018-11-01 DIAGNOSIS — I493 Ventricular premature depolarization: Secondary | ICD-10-CM | POA: Diagnosis not present

## 2018-11-01 DIAGNOSIS — R008 Other abnormalities of heart beat: Secondary | ICD-10-CM | POA: Diagnosis not present

## 2018-11-02 DIAGNOSIS — B351 Tinea unguium: Secondary | ICD-10-CM | POA: Diagnosis not present

## 2018-11-02 DIAGNOSIS — M79675 Pain in left toe(s): Secondary | ICD-10-CM | POA: Diagnosis not present

## 2018-11-03 DIAGNOSIS — R5383 Other fatigue: Secondary | ICD-10-CM | POA: Diagnosis not present

## 2018-11-07 ENCOUNTER — Encounter: Payer: Self-pay | Admitting: Physician Assistant

## 2018-11-07 ENCOUNTER — Ambulatory Visit (INDEPENDENT_AMBULATORY_CARE_PROVIDER_SITE_OTHER): Payer: Medicare HMO | Admitting: Physician Assistant

## 2018-11-07 VITALS — BP 112/56 | HR 67 | Temp 97.5°F | Ht 70.5 in | Wt 191.5 lb

## 2018-11-07 DIAGNOSIS — R6889 Other general symptoms and signs: Secondary | ICD-10-CM | POA: Diagnosis not present

## 2018-11-07 DIAGNOSIS — Z87440 Personal history of urinary (tract) infections: Secondary | ICD-10-CM | POA: Diagnosis not present

## 2018-11-07 DIAGNOSIS — K1370 Unspecified lesions of oral mucosa: Secondary | ICD-10-CM | POA: Diagnosis not present

## 2018-11-07 LAB — URINALYSIS, ROUTINE W REFLEX MICROSCOPIC
Bilirubin Urine: NEGATIVE
Hgb urine dipstick: NEGATIVE
KETONES UR: NEGATIVE
Leukocytes, UA: NEGATIVE
Nitrite: NEGATIVE
PH: 6 (ref 5.0–8.0)
RBC / HPF: NONE SEEN (ref 0–?)
SPECIFIC GRAVITY, URINE: 1.015 (ref 1.000–1.030)
Total Protein, Urine: NEGATIVE
URINE GLUCOSE: NEGATIVE
Urobilinogen, UA: 0.2 (ref 0.0–1.0)

## 2018-11-07 NOTE — Progress Notes (Signed)
[name] is a 77 y.o. male here for a new problem.  History of Present Illness:   Chief Complaint  Patient presents with  . Weight Change    HPI   Unintentional weight loss He states that he has been eating more to intentionally gain weight. Weight is about 5 lb up since last visit. "Seems to be directly related to caloric intake." He is pleased with this. He is going to continue to work on increasing calories but not with excessive sweets.  UTI follow-up Denies any dysuria or changes in urinary color, fevers, chills, flank pain. He is wanting his urine rechecked to ensure that his infection is completely gone.  Mouth lesion He notes that on Friday he noticed a small bump on his inner cheek near the bottom R side of his mouth. Not tender. No discharge. Tried to call his dentist but they were closed. Has never had anything like this.   Wt Readings from Last 5 Encounters:  11/07/18 191 lb 8 oz (86.9 kg)  10/25/18 186 lb 12.8 oz (84.7 kg)  10/24/18 185 lb 8 oz (84.1 kg)  10/19/18 187 lb (84.8 kg)  10/18/18 187 lb (84.8 kg)      Past Medical History:  Diagnosis Date  . Abdominal wall defect, acquired   . Blockage of coronary artery of heart (HCC)    30%  . Hearing aid worn   . Insulin resistance   . LV dysfunction    reduction  . Osteoarthritis   . Pulmonary embolism [facility])      Social History   Socioeconomic History  . Marital status: Married    Spouse name: Not on file  . Number of children: Not on file  . Years of education: Not on file  . Highest education level: Not on file  Occupational History  . Occupation: Doctor, hospital    Comment: wedding cakes, self imployed  Social Needs  . Financial resource strain: Not on file  . Food insecurity:    Worry: Not on file    Inability: Not on file  . Transportation needs:    Medical: Not on file    Non-medical: Not on file  Tobacco Use  . Smoking status: Former Smoker    Packs/day: 1.00    Years: 15.00     Pack years: 15.00    Last attempt to quit: 12/21/1985    Years since quitting: 32.9  . Smokeless tobacco: Never Used  . Tobacco comment: pack a day   Substance and Sexual Activity  . Alcohol use: No  . Drug use: No  . 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 Physical activity:    Days per week: Not on file    Minutes per session: Not on file  . Stress: Not on file  Relationships  . Social connections:    Talks on phone: Not on file    Gets together: Not on file    Attends religious service: Not on file    Active member of club or organization: Not on file    Attends meetings of clubs or organizations: Not on file    Relationship status: Not on file  . Intimate partner violence:    Fear of current or ex partner: Not on file    Emotionally abused: Not on file    Physically abused: Not on file    Forced sexual activity: Not on  file  Other Topics Concern  . Not on file  Social History Narrative   Born in [place].     Past Surgical History:  Procedure Laterality Date  . ACNE CYST REMOVAL  2012   Back   . APPENDECTOMY    . CARDIAC CATHETERIZATION    . COLECTOMY  11/2013   [facility], 2nd to diverticulitis  . RT hip replacement    . TONSILLECTOMY      Family History  Problem Relation Age of Onset  . Heart disease Mother        pacemaker    Allergies  Allergen Reactions  . Tramadol Other (See Comments)    Dizziness  . Flagyl [Metronidazole] Nausea And Vomiting    States IV form causes to allergy  . Flomax [Tamsulosin Hcl] Other (See Comments)    Pre-syncope  . Lipitor [Atorvastatin] Other (See Comments)    Myalgia    Current Medications:   Current Outpatient Medications:  .  apixaban (ELIQUIS) 5 MG TABS tablet, Take 5 mg by mouth 2 (two) times daily. , Disp: , Rfl:  .  fluticasone (FLONASE) 50 MCG/ACT nasal spray, Place into the nose., Disp: , Rfl:  .  levothyroxine (SYNTHROID, LEVOTHROID)  75 MCG tablet, TAKE 1 TABLET BY MOUTH ONCE DAILY BEFORE BREAKFAST, NEEDS FOLLOW UP APPOINTMENT FOR MORE REFILLS, Disp: 90 tablet, Rfl: 1 .  lisinopril (PRINIVIL,ZESTRIL) 5 MG tablet, TAKE 1 TABLET BY MOUTH TWICE DAILY, Disp: , Rfl:  .  meclizine (ANTIVERT) 25 MG tablet, Take by mouth., Disp: , Rfl:  .  metoprolol tartrate (LOPRESSOR) 50 MG tablet, Take 50 mg by mouth 2 (two) times daily., Disp: , Rfl:  .  simvastatin (ZOCOR) 40 MG tablet, TAKE 1 TABLET BY MOUTH ONCE DAILY AT 6 IN THE EVENING, Disp: 90 tablet, Rfl: 0   Review of Systems:   ROS  Negative unless otherwise specified per HPI.   Vitals:   Vitals:   11/07/18 1057  BP: (!) 112/56  Pulse: 67  Temp: (!) 97.5 F (36.4 C)  TempSrc: Oral  SpO2: 99%  Weight: 191 lb 8 oz (86.9 kg)  Height: 5' 10.5" (1.791 m)     Body mass index is 27.09 kg/m.  Physical Exam:   Physical Exam  Constitutional: He appears well-developed. He is cooperative.  Non-toxic appearance. He does not have a sickly appearance. He does not appear ill. No distress.  HENT:  Mouth/Throat: Oropharynx is clear and moist.  Circular white area, approximately 2-3 mm wide, at inner R lower inner cheek. No pain with tenderness.  Cardiovascular: Normal rate, regular rhythm, S1 normal, S2 normal, normal heart sounds and normal pulses.  No LE edema  Pulmonary/Chest: Effort normal and breath sounds normal.  Abdominal: Normal appearance and bowel sounds are normal. There is no tenderness. There is no CVA tenderness.  Neurological: He is alert. GCS eye subscore is 4. GCS verbal subscore is 5. GCS motor subscore is 6.  Skin: Skin is warm, dry and intact.  Psychiatric: He has a normal mood and affect. His speech is normal and behavior is normal.  Nursing note and vitals reviewed.    Assessment and Plan:   [name] was seen today for weight change.  Diagnoses and all orders for this visit:  History of frequent urinary tract infections Asymptomatic. Will check urine  and culture and treat if needed.  -     Urinalysis, Routine w reflex microscopic -     Urine Culture  Unintentional weight change Weight  is improving. Continue monitoring and follow-up prn. He is very diligent about keeping track of his weight. Continue good protein sources.  Mouth lesion Uncertain etiology. Reports that it is significantly improving with time. Discussed that he could reach out to dentist for reassurance. If not, follow-up in 1-2 weeks if persists, sooner if symptoms change.  . Reviewed expectations re: course of current medical issues. . Discussed self-management of symptoms. . Outlined signs and symptoms indicating need for more acute intervention. . Patient verbalized understanding and all questions were answered. . See orders for this visit as documented in the electronic medical record. . Patient received an After-Visit Summary.  CMA or LPN served as scribe during this visit. History, Physical, and Plan performed by medical provider. The above documentation has been reviewed and is accurate and complete.  [name], PA-C

## 2018-11-07 NOTE — Patient Instructions (Signed)
It was great to see you!  We will be in touch with your urine results.  Take care,  [name] PA-C

## 2018-11-08 LAB — URINE CULTURE
MICRO NUMBER:: 91386114
Result:: NO GROWTH
SPECIMEN QUALITY:: ADEQUATE

## 2018-11-10 DIAGNOSIS — I493 Ventricular premature depolarization: Secondary | ICD-10-CM | POA: Diagnosis not present

## 2018-11-10 DIAGNOSIS — I4811 Longstanding persistent atrial fibrillation: Secondary | ICD-10-CM | POA: Diagnosis not present

## 2018-11-10 DIAGNOSIS — I428 Other cardiomyopathies: Secondary | ICD-10-CM | POA: Diagnosis not present

## 2018-11-10 DIAGNOSIS — I2510 Atherosclerotic heart disease of native coronary artery without angina pectoris: Secondary | ICD-10-CM | POA: Diagnosis not present

## 2018-11-11 DIAGNOSIS — I493 Ventricular premature depolarization: Secondary | ICD-10-CM | POA: Diagnosis not present

## 2018-11-11 DIAGNOSIS — I499 Cardiac arrhythmia, unspecified: Secondary | ICD-10-CM | POA: Diagnosis not present

## 2018-11-11 DIAGNOSIS — R000 Tachycardia, unspecified: Secondary | ICD-10-CM | POA: Insufficient documentation

## 2018-11-11 DIAGNOSIS — I472 Ventricular tachycardia: Secondary | ICD-10-CM | POA: Insufficient documentation

## 2018-11-11 DIAGNOSIS — R008 Other abnormalities of heart beat: Secondary | ICD-10-CM | POA: Diagnosis not present

## 2018-11-14 ENCOUNTER — Encounter: Payer: Self-pay | Admitting: Physician Assistant

## 2018-11-15 ENCOUNTER — Ambulatory Visit: Payer: Medicare HMO | Admitting: Physician Assistant

## 2018-11-15 DIAGNOSIS — I472 Ventricular tachycardia: Secondary | ICD-10-CM | POA: Diagnosis not present

## 2018-11-15 DIAGNOSIS — I2510 Atherosclerotic heart disease of native coronary artery without angina pectoris: Secondary | ICD-10-CM | POA: Diagnosis not present

## 2018-11-15 DIAGNOSIS — I340 Nonrheumatic mitral (valve) insufficiency: Secondary | ICD-10-CM | POA: Diagnosis not present

## 2018-11-15 DIAGNOSIS — G459 Transient cerebral ischemic attack, unspecified: Secondary | ICD-10-CM | POA: Diagnosis not present

## 2018-11-15 DIAGNOSIS — E7800 Pure hypercholesterolemia, unspecified: Secondary | ICD-10-CM | POA: Diagnosis not present

## 2018-11-15 DIAGNOSIS — I428 Other cardiomyopathies: Secondary | ICD-10-CM | POA: Diagnosis not present

## 2018-11-15 DIAGNOSIS — I480 Paroxysmal atrial fibrillation: Secondary | ICD-10-CM | POA: Diagnosis not present

## 2018-11-16 IMAGING — DX DG CHEST 2V
3 series · 3 of 3 positions shown · non-contrast
Comparison: None.

CLINICAL DATA: Worsening cough for 2 days.

EXAM:
CHEST  2 VIEW

[chest pa]
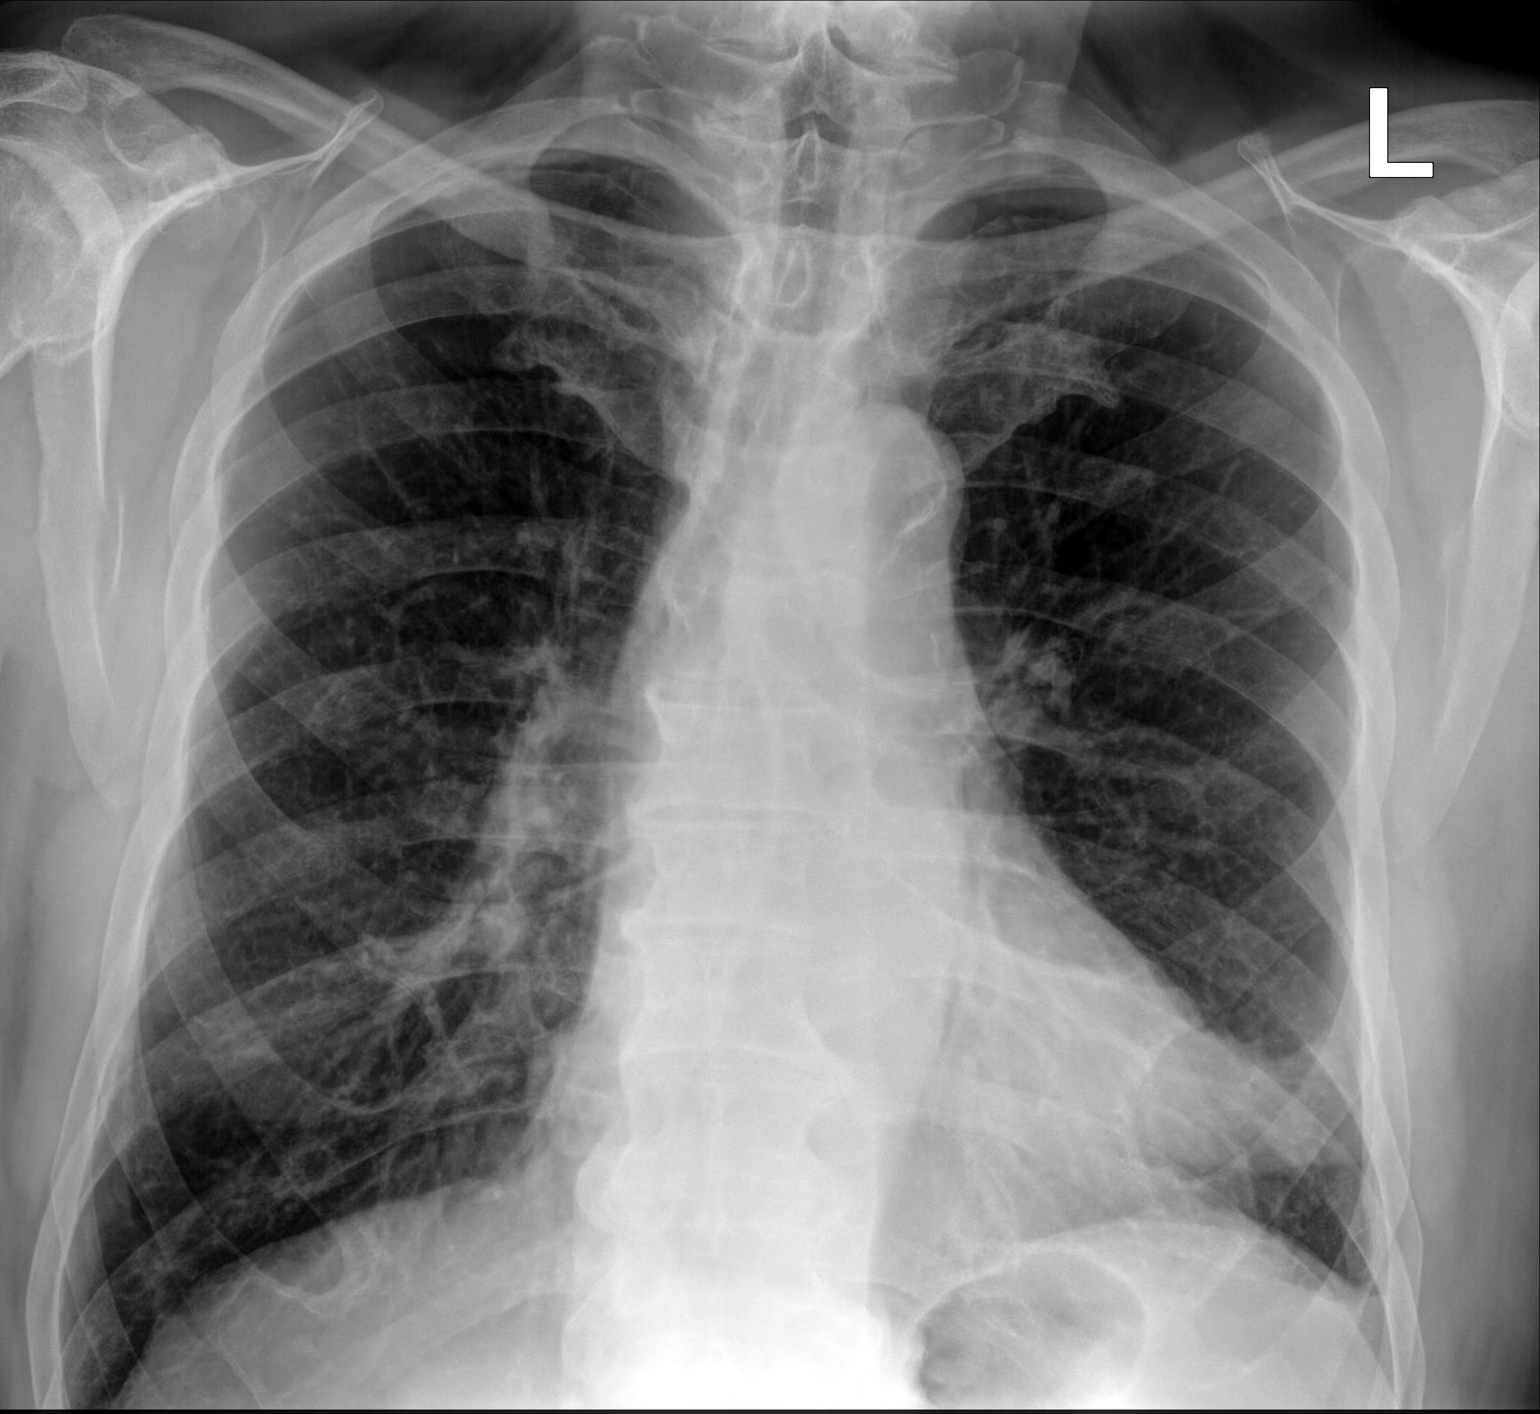

[chest lat (1 of 2)]
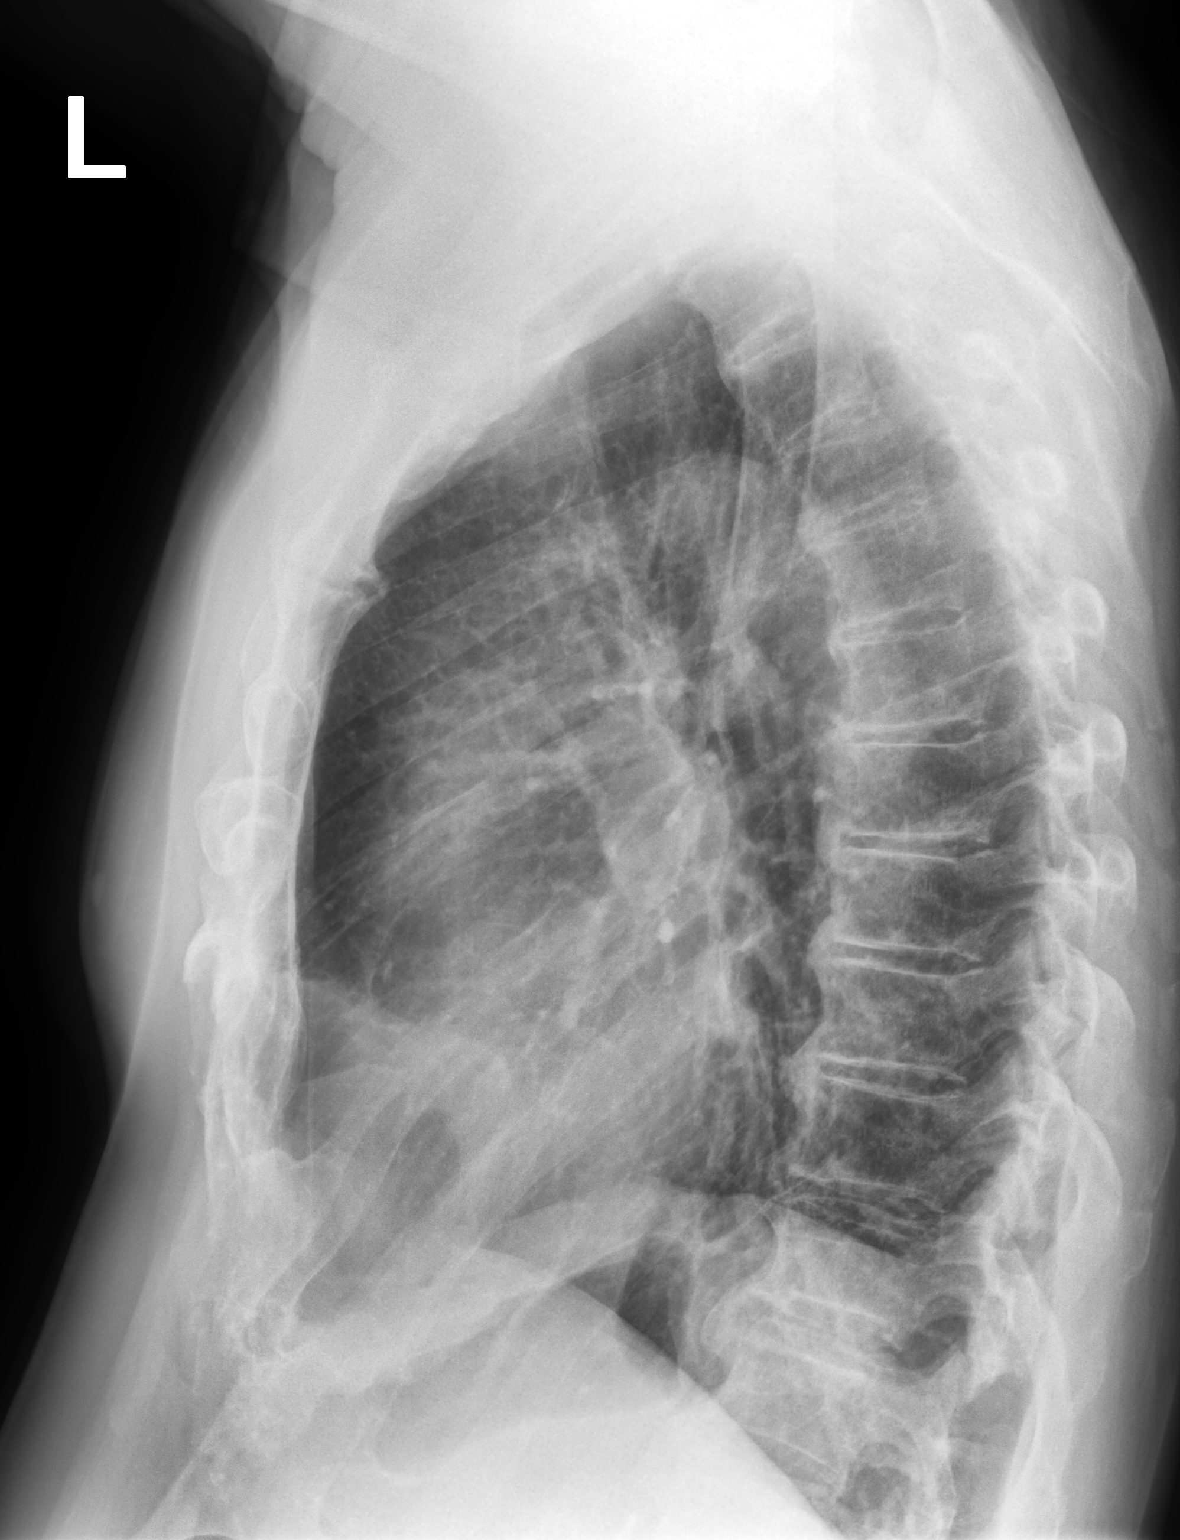

[chest lat (2 of 2)]
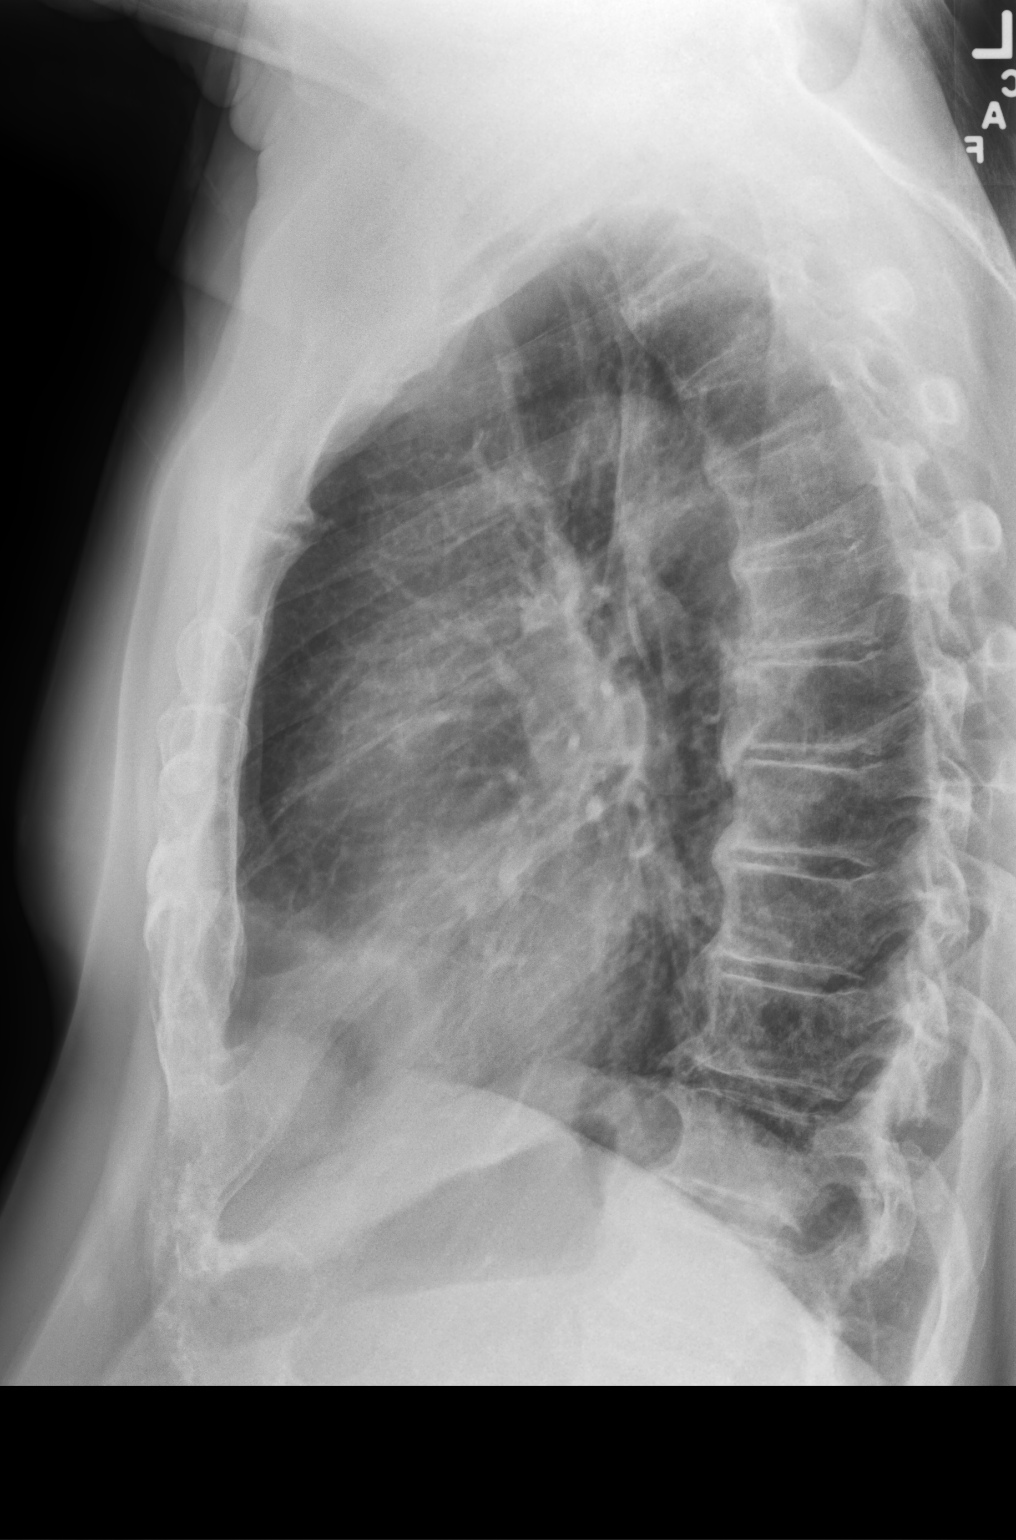

[3 of 3 positions shown; findings below may reference images not displayed]

FINDINGS: Opacity in the lingula. The heart, hila, mediastinum, lungs, and
pleura are otherwise unremarkable.
IMPRESSION: Left lingular infiltrate. Recommend follow-up imaging in 10-14 days
to ensure resolution.

## 2018-11-21 ENCOUNTER — Ambulatory Visit: Payer: Medicare HMO | Admitting: Podiatry

## 2018-11-23 ENCOUNTER — Ambulatory Visit (INDEPENDENT_AMBULATORY_CARE_PROVIDER_SITE_OTHER): Payer: Medicare HMO

## 2018-11-23 DIAGNOSIS — E538 Deficiency of other specified B group vitamins: Secondary | ICD-10-CM

## 2018-11-23 MED ORDER — CYANOCOBALAMIN 1000 MCG/ML IJ SOLN
1000.0000 ug | Freq: Once | INTRAMUSCULAR | Status: AC
  Administered 2018-11-23: 1000 ug via INTRAMUSCULAR

## 2018-11-23 NOTE — Progress Notes (Signed)
Cyanocobalamin 1000 mcg/mL, 1 mL given IM, LT deltoid, mfg: [employer], lot# [id number], exp APR21, ndc: [id number], pt tolerated well.

## 2018-12-02 ENCOUNTER — Other Ambulatory Visit: Payer: Self-pay | Admitting: Physician Assistant

## 2018-12-03 ENCOUNTER — Other Ambulatory Visit: Payer: Self-pay | Admitting: Physician Assistant

## 2018-12-29 ENCOUNTER — Ambulatory Visit (INDEPENDENT_AMBULATORY_CARE_PROVIDER_SITE_OTHER): Payer: Medicare HMO

## 2018-12-29 DIAGNOSIS — Z79899 Other long term (current) drug therapy: Secondary | ICD-10-CM | POA: Diagnosis not present

## 2018-12-29 DIAGNOSIS — R030 Elevated blood-pressure reading, without diagnosis of hypertension: Secondary | ICD-10-CM | POA: Diagnosis not present

## 2018-12-29 DIAGNOSIS — M5442 Lumbago with sciatica, left side: Secondary | ICD-10-CM

## 2018-12-29 DIAGNOSIS — M5441 Lumbago with sciatica, right side: Secondary | ICD-10-CM

## 2018-12-29 DIAGNOSIS — I2510 Atherosclerotic heart disease of native coronary artery without angina pectoris: Secondary | ICD-10-CM | POA: Diagnosis not present

## 2018-12-29 DIAGNOSIS — E538 Deficiency of other specified B group vitamins: Secondary | ICD-10-CM | POA: Diagnosis not present

## 2018-12-29 MED ORDER — CYANOCOBALAMIN 1000 MCG/ML IJ SOLN
1000.0000 ug | Freq: Once | INTRAMUSCULAR | Status: AC
  Administered 2018-12-29: 1000 ug via INTRAMUSCULAR

## 2018-12-29 NOTE — Progress Notes (Signed)
Per orders of [name], [place], injection of b-12 given by Francella Solian in the right deltoid.  Patient tolerated injection well. He will make next injection at check out. We have also make follow up with [facility] in a few months to follow up on this.

## 2019-01-02 ENCOUNTER — Encounter: Payer: Self-pay | Admitting: Sports Medicine

## 2019-01-02 ENCOUNTER — Ambulatory Visit (INDEPENDENT_AMBULATORY_CARE_PROVIDER_SITE_OTHER): Payer: Medicare HMO | Admitting: Sports Medicine

## 2019-01-02 VITALS — BP 148/82 | HR 67 | Ht 70.5 in | Wt 193.4 lb

## 2019-01-02 DIAGNOSIS — G9511 Acute infarction of spinal cord (embolic) (nonembolic): Secondary | ICD-10-CM | POA: Diagnosis not present

## 2019-01-02 DIAGNOSIS — E538 Deficiency of other specified B group vitamins: Secondary | ICD-10-CM | POA: Diagnosis not present

## 2019-01-02 DIAGNOSIS — M5136 Other intervertebral disc degeneration, lumbar region: Secondary | ICD-10-CM | POA: Diagnosis not present

## 2019-01-02 DIAGNOSIS — M5441 Lumbago with sciatica, right side: Secondary | ICD-10-CM

## 2019-01-02 DIAGNOSIS — M5442 Lumbago with sciatica, left side: Secondary | ICD-10-CM | POA: Diagnosis not present

## 2019-01-02 NOTE — Progress Notes (Signed)
Juanda Bond. Nashira Mcglynn, [place] at [facility] [phone]  ZAILYN ROWSER - 78 y.o. male MRN [id number]  Date of birth: 05-08-1941  Visit Date: January 02, 2019  PCP: [name], [place]   Referred by: [name], [place]  SUBJECTIVE:  Chief Complaint  Patient presents with  . f/u LBP    Received epidural injection with Dr. Ernestina Patches 02/09/2013, 08/31/2018 with [facility]. Had MRI T-spine and L-spine 09/06/2018.   . f/u B leg pain, L>R    HPI: Patient comes in with a very complex history over the past several months.  He reports that following a facet injection at [facility] he had significant pain relief in his back however had an episode 3 days later where he developed difficulty walking, and significant weakness in his entire bilateral lower extremities that he describes as almost like a curtain being pulled over his legs (reminiscent of amaurosis fugax from a vision standpoint).  After significant work-up and several trips to the emergency department and an acute hospitalization his symptoms began to improve significantly and there was no objective evidence on imaging that explains the acute onset and sudden resolution of his symptoms.  At the time of hospitalization he was found to be retaining a very large volume of urine yet he had no urge to urinate.  Over the past several months he has had improvements in his symptoms including return of the proximal sensation of his legs but he continues to have a generalized dysesthesia from the knees down bilaterally worse on the left than the right.  REVIEW OF SYSTEMS: Per HPI  HISTORY:  Prior history reviewed and updated per electronic medical record.  Social History   Occupational History  . Occupation: Doctor, hospital    Comment: wedding cakes, self imployed  Tobacco Use  . Smoking status: Former Smoker    Packs/day: 1.00    Years: 15.00    Pack years: 15.00    Last attempt to quit:  12/21/1985    Years since quitting: 33.0  . Smokeless tobacco: Never Used  . Tobacco comment: pack a day   Substance and Sexual Activity  . Alcohol use: No  . Drug use: No  . Sexual activity: Not on file    Comment: self employed, makes cakes, married, 4 adult children,    Social History   Social History Narrative   Born in [place].    Extensive medical history reviewed and chart reviewed in depth from [facility] through care everywhere.  DATA OBTAINED & REVIEWED:  Recent Labs    03/09/18 0838 10/07/18 1056 10/24/18 1134  HGBA1C  --   --  6.2  CALCIUM 9.0 8.9 9.0  AST 20  --  19  ALT 11  --  15  TSH 1.46  --  1.10   No problems updated. No specialty comments available.  OBJECTIVE:  VS:  HT:5' 10.5" (179.1 cm)   WT:193 lb 6.4 oz (87.7 kg)  BMI:27.35    BP:(!) 148/82  HR:67bpm  TEMP: ( )  RESP:96 %   PHYSICAL EXAM: Patient is able to heel and toe walk today.  He is lower extremity neural tension signs are negative.  He has generalized tenderness across the lower lumbar spine.  He has good internal and external rotation of bilateral hips.  Generalized osteoarthritic bossing of the thumbs   ASSESSMENT   1. B12 deficiency   2. Bilateral low back pain with bilateral  sciatica, unspecified chronicity   3. Lumbar degenerative disc disease   4. Spinal cord infarction [facility])     PLAN:  Pertinent additional documentation may be included in corresponding procedure notes, imaging studies, problem based documentation and patient instructions. No problem-specific Assessment & Plan notes found for this encounter.   >50% of this 45 minute visit spent in direct patient counseling and/or coordination of care.  Discussion was focused on education regarding the in discussing the pathoetiology and anticipated clinical course of the above condition.  Ultimately it is hard to completely explain what exactly occurred several months ago and reassurance was provided that  he has had good improvement in his strength and the sensation that is slowly returning proximally to distally should hopefully continue to improve as any type of insult to the nerve will recover in this manner the fact that he continues to see improvement in his reassuring for likely further improvement over the next several months.  We did discuss adding a B complex vitamin on the off chance that he is more than just B12 deficient.  Given the presentation it does sound like he had what I could only explain is similar to a TIA of the spinal cord given the fact that it occurred on both lower extremities and he had objective evidence of significant urinary retention that is atypical for him.  We also discussed the ongoing use of meloxicam in the setting of chronic anticoagulation.  He is well aware of the risks and reports relief that he gets from the meloxicam for his bilateral thumb OA outweighs the risks that he fully understands.  He has had injections in his thumbs and these not been beneficial.  At this time I do recommend that he try to avoid meloxicam on a daily basis and if any type of intervention in the future instead of holding just his blood thinner he should likely hold the meloxicam as well.  The concomitant use of blood thinners and meloxi  I have be seen back at any time for repeat thumb injections or any other monarticular issues.  Return if symptoms worsen or fail to improve.          Gerda Diss, [facility] Physician

## 2019-01-03 ENCOUNTER — Encounter: Payer: Self-pay | Admitting: Sports Medicine

## 2019-01-03 DIAGNOSIS — L219 Seborrheic dermatitis, unspecified: Secondary | ICD-10-CM | POA: Diagnosis not present

## 2019-01-03 DIAGNOSIS — L821 Other seborrheic keratosis: Secondary | ICD-10-CM | POA: Diagnosis not present

## 2019-01-03 DIAGNOSIS — L570 Actinic keratosis: Secondary | ICD-10-CM | POA: Diagnosis not present

## 2019-01-04 DIAGNOSIS — M79675 Pain in left toe(s): Secondary | ICD-10-CM | POA: Diagnosis not present

## 2019-01-04 DIAGNOSIS — L03032 Cellulitis of left toe: Secondary | ICD-10-CM | POA: Diagnosis not present

## 2019-01-04 DIAGNOSIS — B351 Tinea unguium: Secondary | ICD-10-CM | POA: Diagnosis not present

## 2019-01-18 DIAGNOSIS — L03032 Cellulitis of left toe: Secondary | ICD-10-CM | POA: Diagnosis not present

## 2019-01-20 DIAGNOSIS — L570 Actinic keratosis: Secondary | ICD-10-CM | POA: Diagnosis not present

## 2019-01-24 ENCOUNTER — Other Ambulatory Visit: Payer: Self-pay

## 2019-01-24 ENCOUNTER — Encounter: Payer: Self-pay | Admitting: Neurology

## 2019-01-24 ENCOUNTER — Ambulatory Visit: Payer: Medicare HMO | Admitting: Neurology

## 2019-01-24 VITALS — BP 139/82 | HR 60 | Ht 70.5 in | Wt 196.0 lb

## 2019-01-24 DIAGNOSIS — Z7901 Long term (current) use of anticoagulants: Secondary | ICD-10-CM | POA: Diagnosis not present

## 2019-01-24 DIAGNOSIS — R200 Anesthesia of skin: Secondary | ICD-10-CM

## 2019-01-24 DIAGNOSIS — I700 Atherosclerosis of aorta: Secondary | ICD-10-CM

## 2019-01-24 NOTE — Progress Notes (Signed)
[facility]  PATIENT: [name] DOB: April 17, 1941  REFERRING DOCTOR OR PCP:  [name], PA-C ([place]) SOURCE: Patient, notes from PCP, imaging and lab reports, MRI images personally reviewed.  _________________________________   HISTORICAL  CHIEF COMPLAINT:  Chief Complaint  Patient presents with  . Follow-up    RM 13, alone. Last seen 10/19/18. No new sx.     HISTORY OF PRESENT ILLNESS:  [name] is a 77 y.o. man with multiple neurologic issues  Update 01/24/2019: Numbness and burning has improved but not resolved.   He continues to note numbness in his legs, most noted below the knees, mostly in the toes.    There is mild burning sensation in the skin     He notes symptoms more after he has rested (not necessarily sleep).    It is irritating at those times but not bad enough to add a medication.    Moving helps the pain/numbness.  He had had the numbness event 09/05/2019 and he had several days of profound numbness in legs and bladder issues.    Legs were very clumsy.    A thoracic spine MRI (that I have personally reviewed) did not show any spinal cord process.     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  He is on Eliquis.    Update 10/19/2018: About 6 weeks ago 09/04/2018 he was seen in the ED.    He had had facet blocks a couple days earlier when he lost all sensation in both legs that came on over seconds to a minute.   The numbness improved after 30 minutes and then came back 30 minutes later and was worse with numbness/tight dysesthesia.   They went to the ED.   He walked in to the ED.    He was unable to give a urine sample and needed to be catheterized.  He went home with a catheter after spending a long time in the ED.    He saw Dr. Arrie Eastern who admitted him to [facility] and he had MRI thoracic not showing any spinal process.    By 2 days alter he was much better and his bladder control  improved  Since the last visit, he had an MR Venogram confirming reduced flow in the righit sigmoid sinus/jugular vein.  He is already on Eliquis so no treatment is needed at this time but I would recommend Plavix if the Eliquis is ever stopped.  Multiple MRI images performed at [facility] from September were reviewed in his presence.   Given his history, I was most concerned about the thoracic spine.  Study was done with and without contrast and it is entirely normal except for a focus in the right T6 transverse process consistent with a hemangioma and unlikely to cause any symptoms.  The MRI of the lumbar spine shows multilevel degenerative changes including moderate spinal stenosis at L3-L4 and multilevel foraminal narrowing.  However, these changes are stable compared to his previous lumbar spine and would be unlikely to cause his symptoms.  The MRI of the brain did not show any acute findings.  I also reviewed a CT scan of the abdomen and pelvis performed 03/31/2013.  At that time, he had apical sclerosis in the distal aorta.  He had a 45 minute band in his vision, possibly due to a TIA , before his first visit with me.    MRA of the neck and head did not  show any significant issue (mild left ICA stenosis only)     From 07/18/2018: He is a 78 yo man who had several episodes of half moon shaped visual effects on the left in 2013, sometimes with squiggly lines.    He saw ophthalmology an no problem was identified.   In 2016, he had a few more similar episodes.  They would last 30-60 seconds, sometimes less.   He did well until this year when he had several episodes within 2 months.    The most recent episode was 06/29/2018 which was different.   He had an upper right visual defect with a gray band lasting 90 seconds.   This was the first one on the right and also involved graying of vision instead of squiggly lines.   He mentioned this to his PCP and an MRI of the brain was ordered.    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    It lifted and he felt back to normal.    He had a carotid Doppler study 5/16 80/2013 and it showed mild calcification at the bifurcation with mild stenosis estimated to be less than 50%.  He is on Eliquis for Pulmonary embolism (small) an episode of paroxysmal atrial fibrillation and cardiomyopathy (has EF% reportedly 35%).    Of note, the only time he has been found to have A. fib was when he was hospitalized with severe pneumonia.   He sees [facility])  He has had problems with his haring on the left x 30+ years and more recently on his left.    He has had hearing aids x 6 years, though they do not help the left as much as the right.    I personally reviewed the MRI of the brain dated 07/13/2018.  There is mild age-appropriate cortical atrophy and very minimal chronic microvascular ischemic change.  There is abnormal signal in the sigmoid sinus on the right that could represent thrombosis.  This study was done without contrast.  REVIEW OF SYSTEMS: Constitutional: No fevers, chills, sweats, or change in appetite Eyes: No visual changes, double vision, eye pain Ear, nose and throat: He has left greater than the right hearing loss.  Sometimes he has dizziness. Cardiovascular: No chest pain, palpitations.   See above Respiratory: No shortness of breath at rest or with exertion.   No wheezes.  He has snoring. GastrointestinaI: No nausea, vomiting, diarrhea, abdominal pain, fecal incontinence Genitourinary: No dysuria, urinary retention or frequency.  No nocturia. Musculoskeletal: No neck pain, back pain Integumentary: No rash, pruritus, skin lesions Neurological: as above Psychiatric: No depression at this time.  No anxiety Endocrine: No  palpitations, diaphoresis, change in appetite, change in weigh or increased thirst Hematologic/Lymphatic:He is on anticoagulation.  He bruises easily. Allergic/Immunologic: No itchy/runny eyes, nasal congestion, recent allergic reactions, rashes  ALLERGIES: Allergies  Allergen Reactions  . Tramadol Other (See Comments)    Dizziness  . Flagyl [Metronidazole] Nausea And Vomiting    States IV form causes to allergy  . Flomax [Tamsulosin Hcl] Other (See Comments)    Pre-syncope  . Lipitor [Atorvastatin] Other (See Comments)    Myalgia    HOME MEDICATIONS:  Current Outpatient Medications:  .  apixaban (ELIQUIS) 5 MG TABS tablet, Take  5 mg by mouth 2 (two) times daily. , Disp: , Rfl:  .  fluticasone (FLONASE) 50 MCG/ACT nasal spray, Place into the nose., Disp: , Rfl:  .  levothyroxine (SYNTHROID, LEVOTHROID) 75 MCG tablet, TAKE 1 TABLET BY MOUTH ONCE DAILY BEFORE BREAKFAST.  NEEDS  FOLLOW  UP  APPOINTMENT  FOR  MORE  REFILLS, Disp: 90 tablet, Rfl: 0 .  lisinopril (PRINIVIL,ZESTRIL) 10 MG tablet, Take by mouth., Disp: , Rfl:  .  meclizine (ANTIVERT) 25 MG tablet, Take by mouth., Disp: , Rfl:  .  MELOXICAM PO, Take 15 mg by mouth daily. , Disp: , Rfl:  .  metoprolol tartrate (LOPRESSOR) 50 MG tablet, Take 50 mg by mouth 2 (two) times daily., Disp: , Rfl:  .  OMEPRAZOLE PO, Take 1 Dose by mouth daily., Disp: , Rfl:  .  simvastatin (ZOCOR) 40 MG tablet, TAKE 1 TABLET BY MOUTH ONCE DAILY AT 6 PM IN THE EVENING, Disp: 90 tablet, Rfl: 0  PAST MEDICAL HISTORY: Past Medical History:  Diagnosis Date  . Abdominal wall defect, acquired   . Blockage of coronary artery of heart (HCC)    30%  . Hearing aid worn   . Insulin resistance   . LV dysfunction    reduction  . Osteoarthritis   . Pulmonary embolism ([place])     PAST SURGICAL HISTORY: Past Surgical History:  Procedure Laterality Date  . ACNE CYST REMOVAL  2012   Back   . APPENDECTOMY    . CARDIAC CATHETERIZATION    . COLECTOMY   11/2013   [facility], 2nd to diverticulitis  . RT hip replacement    . TONSILLECTOMY      FAMILY HISTORY: Family History  Problem Relation Age of Onset  . Heart disease Mother        pacemaker    SOCIAL HISTORY:  Social History   Socioeconomic History  . Marital status: Married    Spouse name: Not on file  . Number of children: Not on file  . Years of education: Not on file  . Highest education level: Not on file  Occupational History  . Occupation: Doctor, hospital    Comment: wedding cakes, self imployed  Social Needs  . Financial resource strain: Not on file  . Food insecurity:    Worry: Not on file    Inability: Not on file  . Transportation needs:    Medical: Not on file    Non-medical: Not on file  Tobacco Use  . Smoking status: Former Smoker    Packs/day: 1.00    Years: 15.00    Pack years: 15.00    Last attempt to quit: 12/21/1985    Years since quitting: 33.1  . Smokeless tobacco: Never Used  . Tobacco comment: pack a day   Substance and Sexual Activity  . Alcohol use: No  . Drug use: No  . 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 Physical activity:    Days per week: Not on file    Minutes per session: Not on file  . Stress: Not on file  Relationships  . Social connections:    Talks on phone: Not on file    Gets together: Not on file    Attends religious service: Not on file    Active member of club or organization: Not on file    Attends meetings of clubs or organizations: Not on file  Relationship status: Not on file  . Intimate partner violence:    Fear of current or ex partner: Not on file    Emotionally abused: Not on file    Physically abused: Not on file    Forced sexual activity: Not on file  Other Topics Concern  . Not on file  Social History Narrative   Born in [place].      PHYSICAL EXAM  Vitals:   01/24/19 1119 01/24/19 1121  BP: (!) 147/84 139/82  Pulse:  60   Weight: 196 lb (88.9 kg)   Height: 5' 10.5" (1.791 m)     Body mass index is 27.73 kg/m.   General: The patient is well-developed and well-nourished and in no acute distress   Cardiovascular: The heart has a regular rate and rhythm with a normal S1 and S2. There were no murmurs, gallops or rubs.     Neurologic Exam  Mental status: The patient is alert and oriented x 3 at the time of the examination. The patient has apparent normal recent and remote memory, with an apparently normal attention span and concentration ability.   Speech is normal.  Cranial nerves: Extraocular movements are full. Pupils are equal, round, and reactive to light and accomodation.  Visual fields are full.  Facial symmetry is present. There is good facial sensation to soft touch bilaterally.Facial strength is normal.  Trapezius and sternocleidomastoid strength is normal. No dysarthria is noted.  The tongue is midline, and the patient has symmetric elevation of the soft palate. No obvious hearing deficits are noted.  Motor:  Muscle bulk is normal.   Tone is normal. Strength is  5 / 5 in all 4 extremities.   Sensory:   Today, he has normal sensation to touch, temperature and vibration in the arms and legs and there is no longer a sensory level at about T11 as it was previously.    Coordination: Cerebellar testing shows good finger-nose-finger and heel-to-shin Gait and station: Station is normal.   Gait is normal. Tandem gait is normal for age. Romberg is negative.   Reflexes: Deep tendon reflexes are symmetric and normal bilaterally.   Plantar responses are flexor.    DIAGNOSTIC DATA (LABS, IMAGING, TESTING) - I reviewed patient records, labs, notes, testing and imaging myself where available.  Lab Results  Component Value Date   WBC 8.4 10/24/2018   HGB 13.0 10/24/2018   HCT 38.6 (L) 10/24/2018   MCV 89.2 10/24/2018   PLT 239.0 10/24/2018      Component Value Date/Time   NA 141 10/24/2018 1134    K 4.0 10/24/2018 1134   CL 108 10/24/2018 1134   CO2 24 10/24/2018 1134   GLUCOSE 127 (H) 10/24/2018 1134   BUN 28 (H) 10/24/2018 1134   CREATININE 1.23 10/24/2018 1134   CREATININE 1.02 03/09/2018 0838   CALCIUM 9.0 10/24/2018 1134   PROT 6.4 10/24/2018 1134   ALBUMIN 4.4 10/24/2018 1134   AST 19 10/24/2018 1134   ALT 15 10/24/2018 1134   ALKPHOS 40 10/24/2018 1134   BILITOT 0.7 10/24/2018 1134   GFRNONAA 89 01/29/2016 0805   GFRAA >89 01/29/2016 0805   Lab Results  Component Value Date   CHOL 151 03/09/2018   HDL 39 (L) 03/09/2018   LDLCALC 93 03/09/2018   LDLDIRECT 168 (H) 11/23/2006   TRIG 98 03/09/2018   CHOLHDL 3.9 03/09/2018   Lab Results  Component Value Date   HGBA1C 6.2 10/24/2018   Lab Results  Component Value  Date   FXJOITGP49 826 10/07/2018   Lab Results  Component Value Date   TSH 1.10 10/24/2018       ASSESSMENT AND PLAN  Numbness  Current use of long term anticoagulation  Thoracic aortic atherosclerosis ([place])  1.    He is better but symptoms not completely resolved.     I feel a small spinal cord infarction (lower T-spine) best explains his symptons but T-spine was normal   So etiology is still not certain.   2.    He may have RLS but it bothers him more during the day and is mild.  Ferritin was fine 08/2018.  He prefers not to try a medication such as gabapentin or equip 3.    He is advised to call if any new or worsening neurologic symptoms and will return to see me in a few months for reevaluation.  45-minute face-to-face evaluation with greater than one half the time counseling and coordinating care about his recent event and implications.  Sharece Fleischhacker A. Felecia Shelling, MD, [facility] 03/21/5829, 94:07 PM Certified in Neurology, Clinical Neurophysiology, Sleep Medicine, Pain Medicine and Neuroimaging  [facility] [address], [place], [place] [address] [phone]

## 2019-01-31 ENCOUNTER — Ambulatory Visit: Payer: Medicare HMO

## 2019-02-01 DIAGNOSIS — L03032 Cellulitis of left toe: Secondary | ICD-10-CM | POA: Diagnosis not present

## 2019-02-03 ENCOUNTER — Ambulatory Visit (INDEPENDENT_AMBULATORY_CARE_PROVIDER_SITE_OTHER): Payer: Medicare HMO

## 2019-02-03 DIAGNOSIS — E538 Deficiency of other specified B group vitamins: Secondary | ICD-10-CM | POA: Diagnosis not present

## 2019-02-03 MED ORDER — CYANOCOBALAMIN 1000 MCG/ML IJ SOLN
1000.0000 ug | Freq: Once | INTRAMUSCULAR | Status: AC
  Administered 2019-02-03: 1000 ug via INTRAMUSCULAR

## 2019-02-03 NOTE — Progress Notes (Signed)
Per orders of [employer] PA-C, injection of Vitamin b12 1000 mcg given left deltoid IM by Kevan Ny, CMA Patient tolerated injection well.  Per [name], pt to return to have serum b12 level rechecked in 1 month.  Lab appt scheduled for 3/16.

## 2019-03-06 ENCOUNTER — Other Ambulatory Visit: Payer: Self-pay

## 2019-03-06 ENCOUNTER — Other Ambulatory Visit: Payer: Self-pay | Admitting: Physician Assistant

## 2019-03-06 ENCOUNTER — Other Ambulatory Visit (INDEPENDENT_AMBULATORY_CARE_PROVIDER_SITE_OTHER): Payer: Medicare HMO

## 2019-03-06 DIAGNOSIS — E538 Deficiency of other specified B group vitamins: Secondary | ICD-10-CM | POA: Diagnosis not present

## 2019-03-06 LAB — VITAMIN B12: Vitamin B-12: 411 pg/mL (ref 211–911)

## 2019-03-07 ENCOUNTER — Telehealth: Payer: Self-pay | Admitting: *Deleted

## 2019-03-07 NOTE — Telephone Encounter (Signed)
Spoke to pt told him I have his DMV form signed and ready for pickup will put at the front desk. Pt verbalized understanding.

## 2019-03-10 ENCOUNTER — Other Ambulatory Visit: Payer: Self-pay | Admitting: Family Medicine

## 2019-03-10 ENCOUNTER — Other Ambulatory Visit: Payer: Self-pay | Admitting: Physician Assistant

## 2019-03-10 DIAGNOSIS — M1612 Unilateral primary osteoarthritis, left hip: Secondary | ICD-10-CM

## 2019-03-16 DIAGNOSIS — I428 Other cardiomyopathies: Secondary | ICD-10-CM | POA: Diagnosis not present

## 2019-03-16 DIAGNOSIS — I472 Ventricular tachycardia: Secondary | ICD-10-CM | POA: Diagnosis not present

## 2019-03-16 DIAGNOSIS — I2510 Atherosclerotic heart disease of native coronary artery without angina pectoris: Secondary | ICD-10-CM | POA: Diagnosis not present

## 2019-03-16 DIAGNOSIS — I493 Ventricular premature depolarization: Secondary | ICD-10-CM | POA: Diagnosis not present

## 2019-03-16 DIAGNOSIS — I480 Paroxysmal atrial fibrillation: Secondary | ICD-10-CM | POA: Diagnosis not present

## 2019-03-16 DIAGNOSIS — I340 Nonrheumatic mitral (valve) insufficiency: Secondary | ICD-10-CM | POA: Diagnosis not present

## 2019-03-16 DIAGNOSIS — I351 Nonrheumatic aortic (valve) insufficiency: Secondary | ICD-10-CM | POA: Diagnosis not present

## 2019-03-30 ENCOUNTER — Encounter: Payer: Self-pay | Admitting: Physician Assistant

## 2019-03-30 ENCOUNTER — Ambulatory Visit (INDEPENDENT_AMBULATORY_CARE_PROVIDER_SITE_OTHER): Payer: Medicare HMO | Admitting: Physician Assistant

## 2019-03-30 DIAGNOSIS — E538 Deficiency of other specified B group vitamins: Secondary | ICD-10-CM

## 2019-03-30 DIAGNOSIS — M79642 Pain in left hand: Secondary | ICD-10-CM | POA: Diagnosis not present

## 2019-03-30 DIAGNOSIS — R7309 Other abnormal glucose: Secondary | ICD-10-CM

## 2019-03-30 DIAGNOSIS — M79641 Pain in right hand: Secondary | ICD-10-CM

## 2019-03-30 NOTE — Progress Notes (Signed)
Virtual Visit via Video   I connected with [name] on 03/30/19 at 10:40 AM EDT by a video enabled telemedicine application and verified that I am speaking with the correct person using two identifiers. Location patient: Home Location provider: [facility], Office Persons participating in the virtual visit: ANTAEUS KAREL, [name], [place]   I discussed the limitations of evaluation and management by telemedicine and the availability of in person appointments. The patient expressed understanding and agreed to proceed.  Subjective:   HPI:   Insulin resistance Last hemoglobin A1c was performed in November 2019 and was 6.2.  Since that time he has been diligent in working on decreasing his sugar intake.  He is not as quite as active as he was, as the gyms are currently close, however he does try to get out and walk and do other activities as able.  He states that overall his weight is down about 4 5 pounds since he last saw [place] which was intentional.  Arthritis Patient continues to take meloxicam.  He understands the risks of this medication while on Eliquis, however he says that his quality of life is poor when he is not on meloxicam and would like to continue this medication.  He is almost out of this prescription.  He would like a refill today.  He is also currently on omeprazole.  Vitamin B12 deficiency Patient reports that he was unable to tolerate vitamin B12 oral supplement due to nausea.  We currently have his vitamin B12 injections on hold due to the coronavirus.  I did discuss with him possibly trying vitamin B12 sublingual, he is going to consider this.  ROS: See pertinent positives and negatives per HPI.  Patient Active Problem List   Diagnosis Date Noted  . Spinal cord infarction ([place]) 10/19/2018  . Numbness 10/19/2018  . Urinary retention 10/19/2018  . TIA (transient ischemic attack) 07/18/2018  . Visual disturbance 07/18/2018  . Dural sinus thrombosis  07/18/2018  . Current use of long term anticoagulation 11/18/2017  . Chronic pain of both knees 05/24/2017  . Cervical spondylosis without myelopathy 03/28/2017  . Pain in both hands 03/28/2017  . Thoracic aortic atherosclerosis ([place]) 03/04/2017  . Osteoarthritis 03/02/2017  . Community acquired pneumonia of left lower lobe of lung ([place]) 03/02/2017  . Vertigo 03/02/2017  . Lumbar radiculopathy 05/10/2015  . Lumbar degenerative disc disease 05/10/2015  . Cardiomyopathy ([place]) 04/30/2015  . Mitral regurgitation 04/30/2015  . Arthritis of left hip 01/29/2015  . CAD (coronary artery disease) 05/21/2014  . Aortic sclerosis 04/09/2014  . History of pulmonary embolism 12/19/2013  . Diverticulitis of colon (without mention of hemorrhage)(562.11) 10/19/2013  . Diverticulitis of colon 08/26/2013  . Diastolic dysfunction 63/87/5643  . Pulmonary hypertension ([place]) 07/20/2013  . Hypokalemia 03/28/2013  . Paroxysmal atrial fibrillation ([place]) 02/03/2013  . Abnormal stress echo 01/18/2013  . Low back pain 12/05/2012  . Degenerative arthritis of left hip 12/05/2012  . Hard of hearing 08/28/2011  . TACHYCARDIA 07/23/2010  . ABNORMAL FINDINGS, ELEVATED BP W/O HTN 03/14/2007  . HYPERCHOLESTEROLEMIA 12/20/2006  . Hypothyroidism 11/09/2006    Social History   Tobacco Use  . Smoking status: Former Smoker    Packs/day: 1.00    Years: 15.00    Pack years: 15.00    Last attempt to quit: 12/21/1985    Years since quitting: 33.2  . Smokeless tobacco: Never Used  . Tobacco comment: pack a day   Substance Use Topics  . Alcohol use:  No    Current Outpatient Medications:  .  apixaban (ELIQUIS) 5 MG TABS tablet, Take 5 mg by mouth 2 (two) times daily. , Disp: , Rfl:  .  fluticasone (FLONASE) 50 MCG/ACT nasal spray, Place into the nose., Disp: , Rfl:  .  levothyroxine (SYNTHROID, LEVOTHROID) 75 MCG tablet, TAKE 1 TABLET BY MOUTH ONCE DAILY BEFORE BREAKFAST **  NEED  APPOINTMENT  FOR  REFILLS  **, Disp:  90 tablet, Rfl: 0 .  lisinopril (PRINIVIL,ZESTRIL) 10 MG tablet, Take by mouth., Disp: , Rfl:  .  meclizine (ANTIVERT) 25 MG tablet, Take by mouth., Disp: , Rfl:  .  MELOXICAM PO, Take 15 mg by mouth daily. , Disp: , Rfl:  .  metoprolol tartrate (LOPRESSOR) 50 MG tablet, Take 50 mg by mouth 2 (two) times daily., Disp: , Rfl:  .  OMEPRAZOLE PO, Take 1 Dose by mouth daily., Disp: , Rfl:  .  simvastatin (ZOCOR) 40 MG tablet, TAKE 1 TABLET BY MOUTH ONCE DAILY AT  6  PM  IN  THE  EVENING, Disp: 90 tablet, Rfl: 0  Allergies  Allergen Reactions  . Tramadol Other (See Comments)    Dizziness  . Flagyl [Metronidazole] Nausea And Vomiting    States IV form causes to allergy  . Flomax [Tamsulosin Hcl] Other (See Comments)    Pre-syncope  . Lipitor [Atorvastatin] Other (See Comments)    Myalgia    Objective:   VITALS: Per patient if applicable, see vitals. GENERAL: Alert, appears well and in no acute distress. HEENT: Atraumatic, conjunctiva clear, no obvious abnormalities on inspection of external nose and ears. NECK: Normal movements of the head and neck. CARDIOPULMONARY: No increased WOB. Speaking in clear sentences. I:E ratio WNL.  MS: Moves all visible extremities without noticeable abnormality. PSYCH: Pleasant and cooperative, well-groomed. Speech normal rate and rhythm. Affect is appropriate. Insight and judgement are appropriate. Attention is focused, linear, and appropriate.  NEURO: CN grossly intact. Oriented as arrived to appointment on time with no prompting. Moves both UE equally.  SKIN: No obvious lesions, wounds, erythema, or cyanosis noted on face or hands.  Assessment and Plan:   Diagnoses and all orders for this visit:  Pain in both hands I will refill meloxicam for patient today.  He did verbalize understanding of the risk of bleeding while he is on this medication and Eliquis.  He accepts this risk and would like to take the medication.  He was advised if he has any concern  for bleeding to proceed to the ER immediately.  B12 deficiency We are going to continue to hold off on B12 injections as the current benefit of coming into the office for B12 injection does not outweigh the risk.  He is not comfortable with his wife giving injections so we did discuss possibly doing sublingual.  Elevated glucose level We are going to continue to work on diet and activity as able.  As stated above we are not going to bring patient in for labs at this time, as the current benefit of checking his A1c right now, does not outweigh the risk of possible COVID exposure.  Follow-up if symptoms worsen in the meantime.  Will have patient follow-up in 1-3 months for office visit to recheck this.   . Reviewed expectations re: course of current medical issues. . Discussed self-management of symptoms. . Outlined signs and symptoms indicating need for more acute intervention. . Patient verbalized understanding and all questions were answered. [name] Health Maintenance issues including  appropriate healthy diet, exercise, and smoking avoidance were discussed with patient. . See orders for this visit as documented in the electronic medical record.   I discussed the assessment and treatment plan with the patient. The patient was provided an opportunity to ask questions and all were answered. The patient agreed with the plan and demonstrated an understanding of the instructions.   The patient was advised to call back or seek an in-person evaluation if the symptoms worsen or if the condition fails to improve as anticipated.     [place], [place] 03/30/2019

## 2019-03-31 ENCOUNTER — Other Ambulatory Visit: Payer: Self-pay | Admitting: Physician Assistant

## 2019-03-31 NOTE — Telephone Encounter (Signed)
Copied from [place] [id number]. Topic: Quick Communication - Rx Refill/Question >> Mar 31, 2019 10:43 AM Leward Quan A wrote: Medication: MELOXICAM PO  Has the patient contacted their pharmacy? Yes.   (Agent: If no, request that the patient contact the pharmacy for the refill.) (Agent: If yes, when and what did the pharmacy advise?)  Preferred Pharmacy (with phone number or street name): [facility] - , [place] - [address] [phone] (Phone) [phone] (Fax)    Agent: Please be advised that RX refills may take up to 3 business days. We ask that you follow-up with your pharmacy.

## 2019-04-03 MED ORDER — MELOXICAM 15 MG PO TABS: 15.0000 mg | ORAL_TABLET | Freq: Every day | ORAL | 0 refills | Status: DC

## 2019-04-03 NOTE — Telephone Encounter (Signed)
See note

## 2019-04-03 NOTE — Telephone Encounter (Signed)
Pt notified Rx for Meloxicam was sent to pharmacy.

## 2019-05-25 DIAGNOSIS — R69 Illness, unspecified: Secondary | ICD-10-CM | POA: Diagnosis not present

## 2019-05-28 ENCOUNTER — Other Ambulatory Visit: Payer: Self-pay | Admitting: Physician Assistant

## 2019-06-12 ENCOUNTER — Other Ambulatory Visit: Payer: Self-pay | Admitting: Physician Assistant

## 2019-06-16 ENCOUNTER — Other Ambulatory Visit: Payer: Self-pay | Admitting: Physician Assistant

## 2019-06-21 ENCOUNTER — Ambulatory Visit (INDEPENDENT_AMBULATORY_CARE_PROVIDER_SITE_OTHER): Payer: Medicare HMO | Admitting: Physician Assistant

## 2019-06-21 ENCOUNTER — Other Ambulatory Visit: Payer: Self-pay

## 2019-06-21 ENCOUNTER — Encounter: Payer: Self-pay | Admitting: Physician Assistant

## 2019-06-21 ENCOUNTER — Other Ambulatory Visit: Payer: Self-pay | Admitting: Physician Assistant

## 2019-06-21 VITALS — BP 136/82 | HR 67 | Temp 97.7°F | Ht 70.5 in | Wt 196.2 lb

## 2019-06-21 DIAGNOSIS — E538 Deficiency of other specified B group vitamins: Secondary | ICD-10-CM

## 2019-06-21 DIAGNOSIS — E038 Other specified hypothyroidism: Secondary | ICD-10-CM | POA: Diagnosis not present

## 2019-06-21 LAB — TSH: TSH: 0.89 u[IU]/mL (ref 0.35–4.50)

## 2019-06-21 LAB — VITAMIN B12: Vitamin B-12: 401 pg/mL (ref 211–911)

## 2019-06-21 LAB — T4, FREE: Free T4: 0.85 ng/dL (ref 0.60–1.60)

## 2019-06-21 MED ORDER — LEVOTHYROXINE SODIUM 75 MCG PO TABS: ORAL_TABLET | ORAL | 2 refills | Status: DC

## 2019-06-21 MED ORDER — CYANOCOBALAMIN 1000 MCG/ML IJ SOLN
1000.0000 ug | Freq: Once | INTRAMUSCULAR | Status: AC
  Administered 2019-06-21: 1000 ug via INTRAMUSCULAR

## 2019-06-21 NOTE — Progress Notes (Signed)
Per orders of [name], PA-C, injection of Vitamin b12 1000 mcg given left deltoid IM by Kevan Ny, CMA  Patient tolerated injection well.  He will return in 1 month for his next injection

## 2019-06-21 NOTE — Progress Notes (Signed)
[name] is a 78 y.o. male here for a follow up of a pre-existing problem.  History of Present Illness:   Chief Complaint  Patient presents with  . Medication Refill    HPI   Hypothyroidism -- currently taking levothyroxine 75 mcg daily and tolerating well. Compliant with medication. Denies any concerns for uncontrolled thyroid at this time. Denies: fatigue, night sweats, changes in skin/hair  Lab Results  Component Value Date   TSH 1.10 10/24/2018   Vit B12 -- has been taking oral B12 from [employer]. States that its "not dissolving" in his stomach -- said he did some experiments at home and he decided to stop taking it since its not being absorbed/dissolved in his stomach. He has been getting monthly B12 injections and is okay with continuing this if needed.  Lab Results  Component Value Date   [id number] 03/06/2019     Past Medical History:  Diagnosis Date  . Abdominal wall defect, acquired   . Blockage of coronary artery of heart (HCC)    30%  . Hearing aid worn   . Insulin resistance   . LV dysfunction    reduction  . Osteoarthritis   . Pulmonary embolism [facility])      Social History   Socioeconomic History  . Marital status: Married    Spouse name: Not on file  . Number of children: Not on file  . Years of education: Not on file  . Highest education level: Not on file  Occupational History  . Occupation: Doctor, hospital    Comment: wedding cakes, self imployed  Social Needs  . Financial resource strain: Not on file  . Food insecurity    Worry: Not on file    Inability: Not on file  . Transportation needs    Medical: Not on file    Non-medical: Not on file  Tobacco Use  . Smoking status: Former Smoker    Packs/day: 1.00    Years: 15.00    Pack years: 15.00    Quit date: 12/21/1985    Years since quitting: 33.5  . Smokeless tobacco: Never Used  . Tobacco comment: pack a day   Substance and Sexual Activity  . Alcohol use: No  . Drug  use: No  . 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 Physical activity    Days per week: Not on file    Minutes per session: Not on file  . Stress: Not on file  Relationships  . Social Herbalist on phone: Not on file    Gets together: Not on file    Attends religious service: Not on file    Active member of club or organization: Not on file    Attends meetings of clubs or organizations: Not on file    Relationship status: Not on file  . Intimate partner violence    Fear of current or ex partner: Not on file    Emotionally abused: Not on file    Physically abused: Not on file    Forced sexual activity: Not on file  Other Topics Concern  . Not on file  Social History Narrative   Born in [place].     Past Surgical History:  Procedure Laterality Date  . ACNE CYST REMOVAL  2012   Back   . APPENDECTOMY    . CARDIAC CATHETERIZATION    . COLECTOMY  11/2013   [facility], 2nd to diverticulitis  . RT hip replacement    . TONSILLECTOMY      Family History  Problem Relation Age of Onset  . Heart disease Mother        pacemaker    Allergies  Allergen Reactions  . Tramadol Other (See Comments)    Dizziness  . Flagyl [Metronidazole] Nausea And Vomiting    States IV form causes to allergy  . Flomax [Tamsulosin Hcl] Other (See Comments)    Pre-syncope  . Lipitor [Atorvastatin] Other (See Comments)    Myalgia    Current Medications:   Current Outpatient Medications:  .  apixaban (ELIQUIS) 5 MG TABS tablet, Take 5 mg by mouth 2 (two) times daily. , Disp: , Rfl:  .  fluticasone (FLONASE) 50 MCG/ACT nasal spray, Place into the nose., Disp: , Rfl:  .  levothyroxine (SYNTHROID, LEVOTHROID) 75 MCG tablet, TAKE 1 TABLET BY MOUTH ONCE DAILY BEFORE BREAKFAST **  NEED  APPOINTMENT  FOR  REFILLS  **, Disp: 90 tablet, Rfl: 0 .  lisinopril (PRINIVIL,ZESTRIL) 10 MG tablet, Take by mouth., Disp: , Rfl:  .   meclizine (ANTIVERT) 25 MG tablet, Take by mouth., Disp: , Rfl:  .  meloxicam (MOBIC) 15 MG tablet, Take 1 tablet (15 mg total) by mouth daily., Disp: 90 tablet, Rfl: 0 .  metoprolol tartrate (LOPRESSOR) 50 MG tablet, Take 50 mg by mouth 2 (two) times daily., Disp: , Rfl:  .  OMEPRAZOLE PO, Take 1 Dose by mouth daily., Disp: , Rfl:  .  simvastatin (ZOCOR) 40 MG tablet, TAKE 1 TABLET BY MOUTH ONCE DAILY AT 6 PM IN THE EVENING, Disp: 90 tablet, Rfl: 0   Review of Systems:   ROS  Negative unless otherwise specified per HPI.   Vitals:   Vitals:   06/21/19 1147  BP: 136/82  Pulse: 67  Temp: 97.7 F (36.5 C)  TempSrc: Oral  SpO2: 97%  Weight: 196 lb 3.2 oz (89 kg)  Height: 5' 10.5" (1.791 m)     Body mass index is 27.75 kg/m.  Physical Exam:   Physical Exam Vitals signs and nursing note reviewed.  Constitutional:      Appearance: He is well-developed.  HENT:     Head: Normocephalic.  Eyes:     Conjunctiva/sclera: Conjunctivae normal.     Pupils: Pupils are equal, round, and reactive to light.  Neck:     Musculoskeletal: Normal range of motion.  Pulmonary:     Effort: Pulmonary effort is normal.  Musculoskeletal: Normal range of motion.  Skin:    General: Skin is warm and dry.  Neurological:     Mental Status: He is alert and oriented to person, place, and time.  Psychiatric:        Behavior: Behavior normal.        Thought Content: Thought content normal.        Judgment: Judgment normal.     Assessment and Plan:   Gerad was seen today for medication refill.  Diagnoses and all orders for this visit:  B12 deficiency Recheck B12 today. Vitamin B12 was administered after lab draw. Will determine need for further supplementation based on lab results. -     cyanocobalamin ((VITAMIN B-12)) injection 1,000 mcg -     Vitamin B12  Other specified hypothyroidism Recheck today. Refill/adjust as appropriate. -     TSH -     T4, free   . Reviewed expectations  re: course of current  medical issues. . Discussed self-management of symptoms. . Outlined signs and symptoms indicating need for more acute intervention. . Patient verbalized understanding and all questions were answered. . See orders for this visit as documented in the electronic medical record. . Patient received an After-Visit Summary.   [name], PA-C

## 2019-06-21 NOTE — Patient Instructions (Addendum)
It was great to see you!  We will contact you as soon as your labs return. I will refill your medication at that time.  Let's follow-up in 6 months, sooner if you have concerns.  Take care,  [name] PA-C

## 2019-06-22 ENCOUNTER — Other Ambulatory Visit: Payer: Self-pay

## 2019-07-03 ENCOUNTER — Other Ambulatory Visit: Payer: Self-pay | Admitting: Physician Assistant

## 2019-07-03 NOTE — Telephone Encounter (Signed)
Pt requesting refill on Meloxicam 15 mg, okay to refill? 

## 2019-07-11 DIAGNOSIS — D225 Melanocytic nevi of trunk: Secondary | ICD-10-CM | POA: Diagnosis not present

## 2019-07-11 DIAGNOSIS — I781 Nevus, non-neoplastic: Secondary | ICD-10-CM | POA: Diagnosis not present

## 2019-07-11 DIAGNOSIS — L814 Other melanin hyperpigmentation: Secondary | ICD-10-CM | POA: Diagnosis not present

## 2019-07-11 DIAGNOSIS — L821 Other seborrheic keratosis: Secondary | ICD-10-CM | POA: Diagnosis not present

## 2019-07-11 DIAGNOSIS — L570 Actinic keratosis: Secondary | ICD-10-CM | POA: Diagnosis not present

## 2019-07-25 ENCOUNTER — Ambulatory Visit (INDEPENDENT_AMBULATORY_CARE_PROVIDER_SITE_OTHER): Payer: Medicare HMO

## 2019-07-25 ENCOUNTER — Other Ambulatory Visit: Payer: Self-pay

## 2019-07-25 DIAGNOSIS — E538 Deficiency of other specified B group vitamins: Secondary | ICD-10-CM | POA: Diagnosis not present

## 2019-07-25 MED ORDER — CYANOCOBALAMIN 1000 MCG/ML IJ SOLN
1000.0000 ug | Freq: Once | INTRAMUSCULAR | Status: AC
  Administered 2019-07-25: 1000 ug via INTRAMUSCULAR

## 2019-07-25 NOTE — Progress Notes (Signed)
Per orders of [name], [place], injection of vitamin B12 1000 mcg given in right deltoid by [employer], CMA.  Patient tolerated injection well.  He will have his next injection in 1 month.

## 2019-08-04 ENCOUNTER — Ambulatory Visit (INDEPENDENT_AMBULATORY_CARE_PROVIDER_SITE_OTHER): Payer: Medicare HMO

## 2019-08-04 ENCOUNTER — Other Ambulatory Visit: Payer: Self-pay

## 2019-08-04 VITALS — BP 120/68 | Temp 98.7°F | Ht 71.0 in | Wt 192.0 lb

## 2019-08-04 DIAGNOSIS — Z0000 Encounter for general adult medical examination without abnormal findings: Secondary | ICD-10-CM

## 2019-08-04 NOTE — Progress Notes (Signed)
Subjective:   [name] is a 78 y.o. male who presents for Medicare Annual/Subsequent preventive examination.  Review of Systems:  Cardiac Risk Factors include: smoking/ tobacco exposure;advanced age (>56men, >20 women);hypertension     Objective:    Vitals: BP 120/68   Temp 98.7 F (37.1 C)   Ht 5\' 11"  (1.803 m)   Wt 192 lb (87.1 kg)   BMI 26.78 kg/m   Body mass index is 26.78 kg/m.  Advanced Directives 08/04/2019 03/24/2018 03/15/2017 03/27/2013  Does Patient Have a Medical Advance Directive? Yes Yes Yes Patient does not have advance directive;Patient would not like information  Type of Advance Directive Living will;Healthcare Power of [place];Living will -  Does patient want to make changes to medical advance directive? No - Patient declined - - -  Copy of [place] in Chart? No - copy requested - No - copy requested -    Tobacco Social History   Tobacco Use  Smoking Status Former Smoker  . Packs/day: 1.00  . Years: 15.00  . Pack years: 15.00  . Quit date: 12/21/1985  . Years since quitting: 33.6  Smokeless Tobacco Never Used  Tobacco Comment   pack a day      Counseling given: Not Answered Comment: pack a day    Clinical Intake:  Pre-visit preparation completed: Yes  Pain : No/denies pain  Diabetes: No  How often do you need to have someone help you when you read instructions, pamphlets, or other written materials from your doctor or pharmacy?: 1 - Never  Interpreter Needed?: No  Information entered by :: Denman George LPN  Past Medical History:  Diagnosis Date  . Abdominal wall defect, acquired   . Blockage of coronary artery of heart (HCC)    30%  . Hearing aid worn   . Insulin resistance   . LV dysfunction    reduction  . Osteoarthritis   . Pulmonary embolism [facility])    Past Surgical History:  Procedure Laterality Date  . ACNE CYST REMOVAL  2012   Back   . APPENDECTOMY    . CARDIAC  CATHETERIZATION    . COLECTOMY  11/2013   [facility], 2nd to diverticulitis  . RT hip replacement    . TONSILLECTOMY     Family History  Problem Relation Age of Onset  . Heart disease Mother        pacemaker   Social History   Socioeconomic History  . Marital status: Married    Spouse name: Not on file  . Number of children: Not on file  . Years of education: Not on file  . Highest education level: Not on file  Occupational History  . Occupation: Doctor, hospital    Comment: wedding cakes, self imployed  Social Needs  . Financial resource strain: Not on file  . Food insecurity    Worry: Not on file    Inability: Not on file  . Transportation needs    Medical: Not on file    Non-medical: Not on file  Tobacco Use  . Smoking status: Former Smoker    Packs/day: 1.00    Years: 15.00    Pack years: 15.00    Quit date: 12/21/1985    Years since quitting: 33.6  . Smokeless tobacco: Never Used  . Tobacco comment: pack a day   Substance and Sexual Activity  . Alcohol use: No  . Drug use: No  . 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 Physical activity    Days per week: Not on file    Minutes per session: Not on file  . Stress: Not on file  Relationships  . Social Herbalist on phone: Not on file    Gets together: Not on file    Attends religious service: Not on file    Active member of club or organization: Not on file    Attends meetings of clubs or organizations: Not on file    Relationship status: Not on file  Other Topics Concern  . Not on file  Social History Narrative   Born in [place].       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 apixaban (ELIQUIS) 5 MG TABS tablet Take 5 mg by mouth 2 (two) times daily.   . fluticasone (FLONASE) 50 MCG/ACT nasal spray Place into the nose.  .  levothyroxine (SYNTHROID) 75 MCG tablet TAKE 1 TABLET BY MOUTH ONCE DAILY BEFORE BREAKFAST  . lisinopril (PRINIVIL,ZESTRIL) 10 MG tablet Take by mouth.  . meclizine (ANTIVERT) 25 MG tablet Take by mouth.  . meloxicam (MOBIC) 15 MG tablet Take 1 tablet by mouth once daily  . metoprolol tartrate (LOPRESSOR) 50 MG tablet Take 50 mg by mouth 2 (two) times daily.  [name] OMEPRAZOLE PO Take 1 Dose by mouth daily.  . simvastatin (ZOCOR) 40 MG tablet TAKE 1 TABLET BY MOUTH ONCE DAILY AT 6 PM IN THE EVENING   No facility-administered encounter medications on file as of 08/04/2019.     Activities of Daily Living In your present state of health, do you have any difficulty performing the following activities: 08/04/2019  Hearing? Y  Vision? N  Difficulty concentrating or making decisions? N  Walking or climbing stairs? N  Dressing or bathing? N  Doing errands, shopping? N  Preparing Food and eating ? N  Using the Toilet? N  In the past six months, have you accidently leaked urine? N  Do you have problems with loss of bowel control? N  Managing your Medications? N  Managing your Finances? N  Housekeeping or managing your Housekeeping? N  Some recent data might be hidden    Patient Care Team: [name], [place] as PCP - General (Physician Assistant) Ellis Parents, MD as Attending Physician (Cardiology) Gerda Diss, DO as Consulting Physician (Sports Medicine) Kandace Parkins., MD as Referring Physician (Cardiology) Darlis Loan, MD as Referring Physician (Gastroenterology) Felecia Shelling Nanine Means, MD (Neurology)   Assessment:   This is a routine wellness examination for [name].  Exercise Activities and Dietary recommendations Current Exercise Habits: Structured exercise class;Home exercise routine, Type of exercise: walking;treadmill, Time (Minutes): 40, Frequency (Times/Week): 5, Weekly Exercise (Minutes/Week): 200, Intensity: Mild, Exercise limited by: orthopedic condition(s);cardiac  condition(s)  Goals    . Less arthritic pain     . Patient Stated     Stay healthy and engaged in the community        Fall Risk Fall Risk  08/04/2019 03/24/2018 03/07/2018 03/15/2017 03/01/2017  Falls in the past year? 0 No No No No  Number falls in past yr: 0 - - - -  Injury with Fall? 0 - - - -  Follow up Education provided - - - -  Timed Get Up and Go Performed: completed and within normal limits   Depression Screen PHQ 2/9 Scores 08/04/2019 03/24/2018 03/07/2018 03/15/2017  PHQ - 2 Score 0 0 0 0    Cognitive Function MMSE - Mini Mental State Exam 03/24/2018  Not completed: (No Data)        Immunization History  Administered Date(s) Administered  . Influenza Split 12/07/2011, 11/24/2012  . Influenza Whole 09/17/2008, 09/20/2009  . Influenza, High Dose Seasonal PF 01/20/2017  . Influenza,inj,Quad PF,6+ Mos 12/24/2014, 09/10/2015  . Pneumococcal Conjugate-13 12/24/2014  . Pneumococcal Polysaccharide-23 10/24/2009  . Td 10/24/2009  . Tdap 10/14/2018  . Zoster 04/20/2012    Qualifies for Shingles Vaccine? Discussed and patient will check with pharmacy for coverage.  Patient education handout provided    Screening Tests Health Maintenance  Topic Date Due  . TETANUS/TDAP  10/14/2028  . PNA vac Low Risk Adult  Completed  . INFLUENZA VACCINE  Discontinued   Cancer Screenings: Colorectal: up to date; last 09/2013 ([facility])     Plan:    I have personally reviewed and addressed the Medicare Annual Wellness questionnaire and have noted the following in the patient's chart:  A. Medical and social history B. Use of alcohol, tobacco or illicit drugs  C. Current medications and supplements D. Functional ability and status E.  Nutritional status F.  Physical activity G. Advance directives H. List of other physicians I.  Hospitalizations, surgeries, and ER visits in previous 12 months J.  [place] such as hearing and vision if needed, cognitive and depression  L. Referrals, records requested, and appointments- none   In addition, I have reviewed and discussed with patient certain preventive protocols, quality metrics, and best practice recommendations. A written personalized care plan for preventive services as well as general preventive health recommendations were provided to patient.   Signed,  Denman George, LPN  Nurse Health Advisor   Nurse Notes: none

## 2019-08-04 NOTE — Patient Instructions (Addendum)
[name] , Thank you for taking time to come for your Medicare Wellness Visit. I appreciate your ongoing commitment to your health goals. Please review the following plan we discussed and let me know if I can assist you in the future.   Screening recommendations/referrals: Colorectal Screening: completed 09/2013  Vision and Dental Exams: Recommended annual ophthalmology exams for early detection of glaucoma and other disorders of the eye Recommended annual dental exams for proper oral hygiene  Vaccinations: Influenza vaccine:  recommended this fall either at PCP office or through your local pharmacy  Pneumococcal vaccine: completed 12/24/14 Tdap vaccine: 10/14/18 Shingles vaccine: Please call your insurance company to determine your out of pocket expense for the Shingrix vaccine. You may receive this vaccine at your local pharmacy.  Advanced directives:  Please bring a copy of your POA (Power of Attorney) and/or Living Will to your next appointment.  Goals: Recommend to exercise for at least 150 minutes per week.  Next appointment: Please schedule your Annual Wellness Visit with your Nurse Health Advisor in one year.  Preventive Care 77 Years and Older, Male Preventive care refers to lifestyle choices and visits with your health care provider that can promote health and wellness. What does preventive care include?  A yearly physical exam. This is also called an annual well check.  Dental exams once or twice a year.  Routine eye exams. Ask your health care provider how often you should have your eyes checked.  Personal lifestyle choices, including:  Daily care of your teeth and gums.  Regular physical activity.  Eating a healthy diet.  Avoiding tobacco and drug use.  Limiting alcohol use.  Practicing safe sex.  Taking low doses of aspirin every day if recommended by your health care provider..  Taking vitamin and mineral supplements as recommended by your health care  provider. What happens during an annual well check? The services and screenings done by your health care provider during your annual well check will depend on your age, overall health, lifestyle risk factors, and family history of disease. Counseling  Your health care provider may ask you questions about your:  Alcohol use.  Tobacco use.  Drug use.  Emotional well-being.  Home and relationship well-being.  Sexual activity.  Eating habits.  History of falls.  Memory and ability to understand (cognition).  Work and work Statistician. Screening  You may have the following tests or measurements:  Height, weight, and BMI.  Blood pressure.  Lipid and cholesterol levels. These may be checked every 5 years, or more frequently if you are over 75 years old.  Skin check.  Lung cancer screening. You may have this screening every year starting at age 77 if you have a 30-pack-year history of smoking and currently smoke or have quit within the past 15 years.  Fecal occult blood test (FOBT) of the stool. You may have this test every year starting at age 77.  Flexible sigmoidoscopy or colonoscopy. You may have a sigmoidoscopy every 5 years or a colonoscopy every 10 years starting at age 77.  Prostate cancer screening. Recommendations will vary depending on your family history and other risks.  Hepatitis C blood test.  Hepatitis B blood test.  Sexually transmitted disease (STD) testing.  Diabetes screening. This is done by checking your blood sugar (glucose) after you have not eaten for a while (fasting). You may have this done every 1-3 years.  Abdominal aortic aneurysm (AAA) screening. You may need this if you are a current or  former smoker.  Osteoporosis. You may be screened starting at age 34 if you are at high risk. Talk with your health care provider about your test results, treatment options, and if necessary, the need for more tests. Vaccines  Your health care provider  may recommend certain vaccines, such as:  Influenza vaccine. This is recommended every year.  Tetanus, diphtheria, and acellular pertussis (Tdap, Td) vaccine. You may need a Td booster every 10 years.  Zoster vaccine. You may need this after age 104.  Pneumococcal 13-valent conjugate (PCV13) vaccine. One dose is recommended after age 44.  Pneumococcal polysaccharide (PPSV23) vaccine. One dose is recommended after age 33. Talk to your health care provider about which screenings and vaccines you need and how often you need them. This information is not intended to replace advice given to you by your health care provider. Make sure you discuss any questions you have with your health care provider. Document Released: 01/03/2016 Document Revised: 08/26/2016 Document Reviewed: 10/08/2015 Elsevier Interactive Patient Education  2017 [place] Prevention in the Home Falls can cause injuries. They can happen to people of all ages. There are many things you can do to make your home safe and to help prevent falls. What can I do on the outside of my home?  Regularly fix the edges of walkways and driveways and fix any cracks.  Remove anything that might make you trip as you walk through a door, such as a raised step or threshold.  Trim any bushes or trees on the path to your home.  Use bright outdoor lighting.  Clear any walking paths of anything that might make someone trip, such as rocks or tools.  Regularly check to see if handrails are loose or broken. Make sure that both sides of any steps have handrails.  Any raised decks and porches should have guardrails on the edges.  Have any leaves, snow, or ice cleared regularly.  Use sand or salt on walking paths during winter.  Clean up any spills in your garage right away. This includes oil or grease spills. What can I do in the bathroom?  Use night lights.  Install grab bars by the toilet and in the tub and shower. Do not use  towel bars as grab bars.  Use non-skid mats or decals in the tub or shower.  If you need to sit down in the shower, use a plastic, non-slip stool.  Keep the floor dry. Clean up any water that spills on the floor as soon as it happens.  Remove soap buildup in the tub or shower regularly.  Attach bath mats securely with double-sided non-slip rug tape.  Do not have throw rugs and other things on the floor that can make you trip. What can I do in the bedroom?  Use night lights.  Make sure that you have a light by your bed that is easy to reach.  Do not use any sheets or blankets that are too big for your bed. They should not hang down onto the floor.  Have a firm chair that has side arms. You can use this for support while you get dressed.  Do not have throw rugs and other things on the floor that can make you trip. What can I do in the kitchen?  Clean up any spills right away.  Avoid walking on wet floors.  Keep items that you use a lot in easy-to-reach places.  If you need to reach something above you, use  a strong step stool that has a grab bar.  Keep electrical cords out of the way.  Do not use floor polish or wax that makes floors slippery. If you must use wax, use non-skid floor wax.  Do not have throw rugs and other things on the floor that can make you trip. What can I do with my stairs?  Do not leave any items on the stairs.  Make sure that there are handrails on both sides of the stairs and use them. Fix handrails that are broken or loose. Make sure that handrails are as long as the stairways.  Check any carpeting to make sure that it is firmly attached to the stairs. Fix any carpet that is loose or worn.  Avoid having throw rugs at the top or bottom of the stairs. If you do have throw rugs, attach them to the floor with carpet tape.  Make sure that you have a light switch at the top of the stairs and the bottom of the stairs. If you do not have them, ask  someone to add them for you. What else can I do to help prevent falls?  Wear shoes that:  Do not have high heels.  Have rubber bottoms.  Are comfortable and fit you well.  Are closed at the toe. Do not wear sandals.  If you use a stepladder:  Make sure that it is fully opened. Do not climb a closed stepladder.  Make sure that both sides of the stepladder are locked into place.  Ask someone to hold it for you, if possible.  Clearly mark and make sure that you can see:  Any grab bars or handrails.  First and last steps.  Where the edge of each step is.  Use tools that help you move around (mobility aids) if they are needed. These include:  Canes.  Walkers.  Scooters.  Crutches.  Turn on the lights when you go into a dark area. Replace any light bulbs as soon as they burn out.  Set up your furniture so you have a clear path. Avoid moving your furniture around.  If any of your floors are uneven, fix them.  If there are any pets around you, be aware of where they are.  Review your medicines with your doctor. Some medicines can make you feel dizzy. This can increase your chance of falling. Ask your doctor what other things that you can do to help prevent falls. This information is not intended to replace advice given to you by your health care provider. Make sure you discuss any questions you have with your health care provider. Document Released: 10/03/2009 Document Revised: 05/14/2016 Document Reviewed: 01/11/2015 Elsevier Interactive Patient Education  2017 [employer].

## 2019-08-11 NOTE — Progress Notes (Signed)
I have reviewed documentation for AWV and Advance Care planning provided by [facility], I agree with documentation, I was immediately available for any questions. Jessamyn Watterson, DO   

## 2019-08-27 ENCOUNTER — Other Ambulatory Visit: Payer: Self-pay | Admitting: Physician Assistant

## 2019-09-01 ENCOUNTER — Ambulatory Visit: Payer: Medicare HMO | Admitting: Physician Assistant

## 2019-09-06 ENCOUNTER — Ambulatory Visit (INDEPENDENT_AMBULATORY_CARE_PROVIDER_SITE_OTHER): Payer: Medicare HMO

## 2019-09-06 ENCOUNTER — Other Ambulatory Visit: Payer: Self-pay

## 2019-09-06 DIAGNOSIS — E538 Deficiency of other specified B group vitamins: Secondary | ICD-10-CM | POA: Diagnosis not present

## 2019-09-06 MED ORDER — CYANOCOBALAMIN 1000 MCG/ML IJ SOLN
1000.0000 ug | Freq: Once | INTRAMUSCULAR | Status: AC
  Administered 2019-09-06: 1000 ug via INTRAMUSCULAR

## 2019-09-06 NOTE — Progress Notes (Signed)
Per orders of Samantha Worley, PA, injection of B 12 given by Lauri Till Y Cindia Hustead in right deltoid. Patient tolerated injection well. Patient will make appointment for 1month   

## 2019-09-12 DIAGNOSIS — L820 Inflamed seborrheic keratosis: Secondary | ICD-10-CM | POA: Diagnosis not present

## 2019-09-12 DIAGNOSIS — L821 Other seborrheic keratosis: Secondary | ICD-10-CM | POA: Diagnosis not present

## 2019-09-12 DIAGNOSIS — L219 Seborrheic dermatitis, unspecified: Secondary | ICD-10-CM | POA: Diagnosis not present

## 2019-09-18 DIAGNOSIS — I340 Nonrheumatic mitral (valve) insufficiency: Secondary | ICD-10-CM | POA: Diagnosis not present

## 2019-09-18 DIAGNOSIS — I472 Ventricular tachycardia: Secondary | ICD-10-CM | POA: Diagnosis not present

## 2019-09-18 DIAGNOSIS — E7800 Pure hypercholesterolemia, unspecified: Secondary | ICD-10-CM | POA: Diagnosis not present

## 2019-09-18 DIAGNOSIS — I480 Paroxysmal atrial fibrillation: Secondary | ICD-10-CM | POA: Diagnosis not present

## 2019-09-18 DIAGNOSIS — I428 Other cardiomyopathies: Secondary | ICD-10-CM | POA: Diagnosis not present

## 2019-09-20 DIAGNOSIS — H9120 Sudden idiopathic hearing loss, unspecified ear: Secondary | ICD-10-CM | POA: Diagnosis not present

## 2019-09-20 DIAGNOSIS — H903 Sensorineural hearing loss, bilateral: Secondary | ICD-10-CM | POA: Diagnosis not present

## 2019-09-23 ENCOUNTER — Other Ambulatory Visit: Payer: Self-pay | Admitting: Physician Assistant

## 2019-10-02 DIAGNOSIS — K30 Functional dyspepsia: Secondary | ICD-10-CM | POA: Diagnosis not present

## 2019-10-03 DIAGNOSIS — H9190 Unspecified hearing loss, unspecified ear: Secondary | ICD-10-CM | POA: Diagnosis not present

## 2019-10-03 DIAGNOSIS — H9123 Sudden idiopathic hearing loss, bilateral: Secondary | ICD-10-CM | POA: Diagnosis not present

## 2019-10-03 DIAGNOSIS — H905 Unspecified sensorineural hearing loss: Secondary | ICD-10-CM | POA: Diagnosis not present

## 2019-10-04 ENCOUNTER — Ambulatory Visit (INDEPENDENT_AMBULATORY_CARE_PROVIDER_SITE_OTHER): Payer: Medicare HMO

## 2019-10-04 ENCOUNTER — Other Ambulatory Visit: Payer: Self-pay

## 2019-10-04 DIAGNOSIS — E538 Deficiency of other specified B group vitamins: Secondary | ICD-10-CM

## 2019-10-04 MED ORDER — CYANOCOBALAMIN 1000 MCG/ML IJ SOLN
1000.0000 ug | Freq: Once | INTRAMUSCULAR | Status: AC
  Administered 2019-10-04: 1000 ug via INTRAMUSCULAR

## 2019-10-04 NOTE — Progress Notes (Signed)
Per orders of [name], [place], injection of vitamin B12 1000 mcg given in left deltoid by [employer], CMA. Patient tolerated injection well.

## 2019-10-11 DIAGNOSIS — H903 Sensorineural hearing loss, bilateral: Secondary | ICD-10-CM | POA: Diagnosis not present

## 2019-10-11 DIAGNOSIS — H9120 Sudden idiopathic hearing loss, unspecified ear: Secondary | ICD-10-CM | POA: Diagnosis not present

## 2019-11-01 ENCOUNTER — Ambulatory Visit (INDEPENDENT_AMBULATORY_CARE_PROVIDER_SITE_OTHER): Payer: Medicare HMO

## 2019-11-01 ENCOUNTER — Other Ambulatory Visit: Payer: Self-pay

## 2019-11-01 DIAGNOSIS — E538 Deficiency of other specified B group vitamins: Secondary | ICD-10-CM

## 2019-11-01 MED ORDER — CYANOCOBALAMIN 1000 MCG/ML IJ SOLN
1000.0000 ug | Freq: Once | INTRAMUSCULAR | Status: AC
  Administered 2019-11-01: 1000 ug via INTRAMUSCULAR

## 2019-11-01 NOTE — Progress Notes (Signed)
Per orders of Dr. Jonni Sanger injection of B12 given by Sandford Craze.RN  Patient tolerated injection well.

## 2019-11-22 DIAGNOSIS — Z96642 Presence of left artificial hip joint: Secondary | ICD-10-CM | POA: Diagnosis not present

## 2019-11-28 ENCOUNTER — Other Ambulatory Visit: Payer: Self-pay

## 2019-11-29 ENCOUNTER — Ambulatory Visit (INDEPENDENT_AMBULATORY_CARE_PROVIDER_SITE_OTHER): Payer: Medicare HMO

## 2019-11-29 DIAGNOSIS — E538 Deficiency of other specified B group vitamins: Secondary | ICD-10-CM

## 2019-11-29 MED ORDER — CYANOCOBALAMIN 1000 MCG/ML IJ SOLN
1000.0000 ug | Freq: Once | INTRAMUSCULAR | Status: AC
  Administered 2019-11-29: 1000 ug via INTRAMUSCULAR

## 2019-11-29 NOTE — Progress Notes (Signed)
Per orders of [name], [place], injection of B12 given by Serita Sheller in left deltoid. Patient tolerated injection well. Patient will make appointment for 1 month.

## 2019-11-30 DIAGNOSIS — R69 Illness, unspecified: Secondary | ICD-10-CM | POA: Diagnosis not present

## 2019-12-17 ENCOUNTER — Other Ambulatory Visit: Payer: Self-pay | Admitting: Physician Assistant

## 2020-01-01 ENCOUNTER — Ambulatory Visit: Payer: Medicare HMO | Admitting: Physician Assistant

## 2020-01-02 ENCOUNTER — Other Ambulatory Visit: Payer: Self-pay

## 2020-01-02 ENCOUNTER — Ambulatory Visit (INDEPENDENT_AMBULATORY_CARE_PROVIDER_SITE_OTHER): Payer: Medicare HMO

## 2020-01-02 DIAGNOSIS — E538 Deficiency of other specified B group vitamins: Secondary | ICD-10-CM | POA: Diagnosis not present

## 2020-01-02 MED ORDER — CYANOCOBALAMIN 1000 MCG/ML IJ SOLN
1000.0000 ug | Freq: Once | INTRAMUSCULAR | Status: AC
  Administered 2020-01-02: 1000 ug via INTRAMUSCULAR

## 2020-01-02 NOTE — Progress Notes (Signed)
Per orders of [name], PA-C, injection of Vitamin b12, 1000 mcg given right deltoid IM by Kevan Ny, CMA  Patient tolerated injection well.  He will return in 1 month for his next injection.

## 2020-02-01 ENCOUNTER — Other Ambulatory Visit: Payer: Self-pay

## 2020-02-01 ENCOUNTER — Ambulatory Visit (INDEPENDENT_AMBULATORY_CARE_PROVIDER_SITE_OTHER): Payer: Medicare HMO

## 2020-02-01 DIAGNOSIS — E538 Deficiency of other specified B group vitamins: Secondary | ICD-10-CM

## 2020-02-01 MED ORDER — CYANOCOBALAMIN 1000 MCG/ML IJ SOLN
1000.0000 ug | Freq: Once | INTRAMUSCULAR | Status: AC
  Administered 2020-02-01: 1000 ug via INTRAMUSCULAR

## 2020-02-01 NOTE — Progress Notes (Signed)
Per orders of [name], [place], injection of Vitamin B12 given by Tobe Sos in left deltoid. Patient tolerated injection well. Patient will make appointment for 1 month.

## 2020-02-29 ENCOUNTER — Ambulatory Visit (INDEPENDENT_AMBULATORY_CARE_PROVIDER_SITE_OTHER): Payer: Medicare HMO

## 2020-02-29 ENCOUNTER — Other Ambulatory Visit: Payer: Self-pay

## 2020-02-29 DIAGNOSIS — E538 Deficiency of other specified B group vitamins: Secondary | ICD-10-CM

## 2020-02-29 MED ORDER — CYANOCOBALAMIN 1000 MCG/ML IJ SOLN
1000.0000 ug | Freq: Once | INTRAMUSCULAR | Status: AC
  Administered 2020-02-29: 1000 ug via INTRAMUSCULAR

## 2020-02-29 NOTE — Progress Notes (Signed)
Per orders of Joya Gaskins , injection of B12 given by Betti Cruz in right deltoid. Patient tolerated injection well. Patient will make appointment for 1 month.

## 2020-03-12 DIAGNOSIS — I428 Other cardiomyopathies: Secondary | ICD-10-CM | POA: Diagnosis not present

## 2020-03-12 DIAGNOSIS — I5189 Other ill-defined heart diseases: Secondary | ICD-10-CM | POA: Diagnosis not present

## 2020-03-12 DIAGNOSIS — I517 Cardiomegaly: Secondary | ICD-10-CM | POA: Diagnosis not present

## 2020-03-12 DIAGNOSIS — I480 Paroxysmal atrial fibrillation: Secondary | ICD-10-CM | POA: Diagnosis not present

## 2020-03-17 ENCOUNTER — Other Ambulatory Visit: Payer: Self-pay | Admitting: Physician Assistant

## 2020-03-19 DIAGNOSIS — I480 Paroxysmal atrial fibrillation: Secondary | ICD-10-CM | POA: Diagnosis not present

## 2020-03-19 DIAGNOSIS — E7800 Pure hypercholesterolemia, unspecified: Secondary | ICD-10-CM | POA: Diagnosis not present

## 2020-03-19 DIAGNOSIS — I340 Nonrheumatic mitral (valve) insufficiency: Secondary | ICD-10-CM | POA: Diagnosis not present

## 2020-03-19 DIAGNOSIS — I428 Other cardiomyopathies: Secondary | ICD-10-CM | POA: Diagnosis not present

## 2020-03-21 DIAGNOSIS — R69 Illness, unspecified: Secondary | ICD-10-CM | POA: Diagnosis not present

## 2020-03-25 ENCOUNTER — Other Ambulatory Visit: Payer: Self-pay | Admitting: Physician Assistant

## 2020-04-01 ENCOUNTER — Ambulatory Visit: Payer: Medicare HMO | Admitting: Physician Assistant

## 2020-04-03 ENCOUNTER — Other Ambulatory Visit: Payer: Self-pay

## 2020-04-03 ENCOUNTER — Encounter: Payer: Self-pay | Admitting: Physician Assistant

## 2020-04-03 ENCOUNTER — Ambulatory Visit (INDEPENDENT_AMBULATORY_CARE_PROVIDER_SITE_OTHER): Payer: Medicare HMO | Admitting: Physician Assistant

## 2020-04-03 VITALS — BP 152/72 | HR 81 | Temp 98.0°F | Ht 71.0 in | Wt 202.6 lb

## 2020-04-03 DIAGNOSIS — R0602 Shortness of breath: Secondary | ICD-10-CM

## 2020-04-03 DIAGNOSIS — R710 Precipitous drop in hematocrit: Secondary | ICD-10-CM | POA: Diagnosis not present

## 2020-04-03 DIAGNOSIS — L299 Pruritus, unspecified: Secondary | ICD-10-CM

## 2020-04-03 DIAGNOSIS — E8881 Metabolic syndrome: Secondary | ICD-10-CM | POA: Diagnosis not present

## 2020-04-03 DIAGNOSIS — E538 Deficiency of other specified B group vitamins: Secondary | ICD-10-CM

## 2020-04-03 DIAGNOSIS — E7800 Pure hypercholesterolemia, unspecified: Secondary | ICD-10-CM | POA: Diagnosis not present

## 2020-04-03 DIAGNOSIS — E038 Other specified hypothyroidism: Secondary | ICD-10-CM | POA: Diagnosis not present

## 2020-04-03 LAB — LIPID PANEL
Cholesterol: 147 mg/dL (ref 0–200)
HDL: 40.3 mg/dL (ref >39.00)
LDL Cholesterol: 87 mg/dL (ref 0–99)
NonHDL: 106.31
Total CHOL/HDL Ratio: 4
Triglycerides: 95 mg/dL (ref 0.0–149.0)
VLDL: 19 mg/dL (ref 0.0–40.0)

## 2020-04-03 LAB — CBC WITH DIFFERENTIAL/PLATELET
Basophils Absolute: 0.1 10*3/uL (ref 0.0–0.1)
Basophils Relative: 1 % (ref 0.0–3.0)
Eosinophils Absolute: 0.2 10*3/uL (ref 0.0–0.7)
Eosinophils Relative: 2.3 % (ref 0.0–5.0)
HCT: 39.4 % (ref 39.0–52.0)
Hemoglobin: 13.4 g/dL (ref 13.0–17.0)
Lymphocytes Relative: 24.6 % (ref 12.0–46.0)
Lymphs Abs: 1.7 10*3/uL (ref 0.7–4.0)
MCHC: 34.1 g/dL (ref 30.0–36.0)
MCV: 88.4 fl (ref 78.0–100.0)
Monocytes Absolute: 0.7 10*3/uL (ref 0.1–1.0)
Monocytes Relative: 10.4 % (ref 3.0–12.0)
Neutro Abs: 4.4 10*3/uL (ref 1.4–7.7)
Neutrophils Relative %: 61.7 % (ref 43.0–77.0)
Platelets: 191 10*3/uL (ref 150.0–400.0)
RBC: 4.46 Mil/uL (ref 4.22–5.81)
RDW: 13.8 % (ref 11.5–15.5)
WBC: 7.1 10*3/uL (ref 4.0–10.5)

## 2020-04-03 LAB — VITAMIN B12: Vitamin B-12: 350 pg/mL (ref 211–911)

## 2020-04-03 LAB — TSH: TSH: 0.77 u[IU]/mL (ref 0.35–4.50)

## 2020-04-03 LAB — HEMOGLOBIN A1C: Hgb A1c MFr Bld: 6 % (ref 4.6–6.5)

## 2020-04-03 MED ORDER — ACETIC ACID 2 % OT SOLN: 4.0000 [drp] | Freq: Three times a day (TID) | OTIC | 0 refills | Status: DC | PRN

## 2020-04-03 NOTE — Progress Notes (Signed)
[name] is a 79 y.o. male here for a follow up of a pre-existing problem.  I acted as a Education administrator for [employer], PA-C [facility], [place]  History of Present Illness:   Chief Complaint  Patient presents with  . B12 deficiency    HPI   Hypothyroidism Patient is due for thyroid check.  Last labs were done in July 2020.  He is currently maintained on levothyroxine 75 mcg. Complaint with medication. Denies: fatigue, night sweats, changes in skin/hair.  Vitamin B12 deficiency Patient has been receiving vitamin B12 injections in our office.  Last vitamin B12 was checked in July 2020.  Hyperlipidemia Patient is currently on Zocor 40 mg daily.  He is tolerating this well.  His last lipid panel was approximately 2 years ago.  Insulin resistance Last hemoglobin A1c was in the prediabetic range at 6.2% approximately 1 year ago.  Wt Readings from Last 5 Encounters:  04/03/20 202 lb 9.6 oz (91.9 kg)  08/04/19 192 lb (87.1 kg)  06/21/19 196 lb 3.2 oz (89 kg)  01/24/19 196 lb (88.9 kg)  01/02/19 193 lb 6.4 oz (87.7 kg)    SOB Has limited activity due to spinal stenosis. Is considering returning to his chiropractor to see if this can help. Recently had echo with cardiology that shows improvement of EF to 40-45%. He denies any unusual ankle swelling. When he is at home he gets SpO2 of 97-98%. SOB is always with activity, never at rest. Feels like if he over exerts himself, even if just washing car, he gets nauseous. Has been dealing with this issue for quite some time. In the 1980's had a breathing test that showed reduced lung function. Denies LE swelling. He is compliant with all of cardiology's recommendations and takes his medications appropriately. He is a former smoker.  Itchy ear States that he has been having itchy ear canals, thinks that it may be related to his hearing aids. Denies blood, discharge from ear, or worsening hearing.  Decreased hgb Has history of decreased  hgb and would like this updated today with his labwork. Denies unusual fatigue or lightheadedness.   Past Medical History:  Diagnosis Date  . Abdominal wall defect, acquired   . Blockage of coronary artery of heart (HCC)    30%  . Hearing aid worn   . Insulin resistance   . LV dysfunction    reduction  . Osteoarthritis   . Pulmonary embolism [facility])      Social History   Socioeconomic History  . Marital status: Married    Spouse name: Not on file  . Number of children: Not on file  . Years of education: Not on file  . Highest education level: Not on file  Occupational History  . Occupation: Doctor, hospital    Comment: wedding cakes, self imployed  Tobacco Use  . Smoking status: Former Smoker    Packs/day: 1.00    Years: 15.00    Pack years: 15.00    Quit date: 12/21/1985    Years since quitting: 34.3  . Smokeless tobacco: Never Used  . Tobacco comment: pack a day   Substance and Sexual Activity  . Alcohol use: No  . Drug use: No  . Sexual activity: Not on file    Comment: self employed, makes cakes, married, 4 adult children,   Other Topics Concern  . Not on file  Social History Narrative   Born in [place].       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 Difficulty of Paying Living Expenses:   Food Insecurity:   . Worried About Charity fundraiser in the Last Year:   . Arboriculturist in the Last Year:   Transportation Needs:   . Film/video editor (Medical):   [name] Lack of Transportation (Non-Medical):   Physical Activity:   . Days of Exercise per Week:   . Minutes of Exercise per Session:   Stress:   . Feeling of Stress :   Social Connections:   . Frequency of Communication with Friends and Family:   . Frequency of Social Gatherings with Friends and Family:   . Attends Religious Services:   . Active Member of Clubs or Organizations:   . Attends Theatre manager Meetings:   [name] Marital Status:   Intimate Partner Violence:   . Fear of Current or Ex-Partner:   . Emotionally Abused:   [name] Physically Abused:   . Sexually Abused:     Past Surgical History:  Procedure Laterality Date  . ACNE CYST REMOVAL  2012   Back   . APPENDECTOMY    . CARDIAC CATHETERIZATION    . COLECTOMY  11/2013   [facility], 2nd to diverticulitis  . RT hip replacement    . TONSILLECTOMY      Family History  Problem Relation Age of Onset  . Heart disease Mother        pacemaker    Allergies  Allergen Reactions  . Tramadol Other (See Comments)    Dizziness  . Flagyl [Metronidazole] Nausea And Vomiting    States IV form causes to allergy  . Flomax [Tamsulosin Hcl] Other (See Comments)    Pre-syncope  . Lipitor [Atorvastatin] Other (See Comments)    Myalgia    Current Medications:   Current Outpatient Medications:  .  apixaban (ELIQUIS) 5 MG TABS tablet, Take 5 mg by mouth 2 (two) times daily. , Disp: , Rfl:  .  fluticasone (FLONASE) 50 MCG/ACT nasal spray, Place into the nose., Disp: , Rfl:  .  levothyroxine (SYNTHROID) 75 MCG tablet, TAKE 1 TABLET BY MOUTH ONCE DAILY BEFORE BREAKFAST, Disp: 90 tablet, Rfl: 0 .  lisinopril (PRINIVIL,ZESTRIL) 10 MG tablet, Take by mouth., Disp: , Rfl:  .  meclizine (ANTIVERT) 25 MG tablet, Take by mouth., Disp: , Rfl:  .  meloxicam (MOBIC) 15 MG tablet, Take 1 tablet by mouth once daily, Disp: 90 tablet, Rfl: 0 .  metoprolol tartrate (LOPRESSOR) 50 MG tablet, Take 50 mg by mouth 2 (two) times daily., Disp: , Rfl:  .  OMEPRAZOLE PO, Take 1 Dose by mouth daily., Disp: , Rfl:  .  simvastatin (ZOCOR) 40 MG tablet, TAKE 1 TABLET BY MOUTH ONCE DAILY AT  6  PM, Disp: 90 tablet, Rfl: 0 .  acetic acid 2 % otic solution, Place 4 drops into both ears 3 (three) times daily as needed (itching)., Disp: 15 mL, Rfl: 0   Review of Systems:   ROS  Negative unless otherwise specified per HPI.  Vitals:   Vitals:   04/03/20 1054   BP: (!) 152/72  Pulse: 81  Temp: 98 F (36.7 C)  TempSrc: Temporal  SpO2: 94%  Weight: 202 lb 9.6 oz (91.9 kg)  Height: 5\' 11"  (1.803 m)     Body mass index is 28.26 kg/m.  Physical  Exam:   Physical Exam Vitals and nursing note reviewed.  Constitutional:      General: He is not in acute distress.    Appearance: He is well-developed. He is not ill-appearing or toxic-appearing.  Cardiovascular:     Rate and Rhythm: Normal rate and regular rhythm.     Pulses: Normal pulses.     Heart sounds: Normal heart sounds, S1 normal and S2 normal.     Comments: No LE edema Pulmonary:     Effort: Pulmonary effort is normal.     Breath sounds: Normal breath sounds.  Skin:    General: Skin is warm and dry.  Neurological:     Mental Status: He is alert.     GCS: GCS eye subscore is 4. GCS verbal subscore is 5. GCS motor subscore is 6.  Psychiatric:        Speech: Speech normal.        Behavior: Behavior normal. Behavior is cooperative.       Assessment and Plan:   [name] was seen today for b12 deficiency.  Diagnoses and all orders for this visit:  Other specified hypothyroidism Currently prescribed 75 mcg levothyroxine.  Update labs today and determine need for medication change.  Will refill medications based upon results. -     TSH  B12 deficiency Update vitamin B12 level today and determine need for further supplementation. -     Vitamin B12  Insulin resistance Update hemoglobin A1c today and make recommendations accordingly. -     Hemoglobin A1c  HYPERCHOLESTEROLEMIA Currently prescribed 40 mg Zocor.  Update lipid panel today and make medication changes based on results. -     Lipid panel  SOB (shortness of breath) Long discussion regarding this today.  We are going to refer to pulmonology for further evaluation and management of this.  I do think that part of his shortness of breath is from being deconditioned from not exercising as he used to for the past year  due to the pandemic. -     Ambulatory referral to Pulmonology  Decreased hemoglobin Update CBC today. -     CBC with Differential/Platelet  Itching of ear Will trial acetic acid solution.  Follow-up if no symptom improvement.  Other orders -     acetic acid 2 % otic solution; Place 4 drops into both ears 3 (three) times daily as needed (itching).  . Reviewed expectations re: course of current medical issues. . Discussed self-management of symptoms. . Outlined signs and symptoms indicating need for more acute intervention. . Patient verbalized understanding and all questions were answered. . See orders for this visit as documented in the electronic medical record. . Patient received an After-Visit Summary.  CMA or LPN served as scribe during this visit. History, Physical, and Plan performed by medical provider. The above documentation has been reviewed and is accurate and complete.  I spent 50 minutes with this patient, greater than 50% was face-to-face time counseling regarding the above diagnoses.  [name], PA-C

## 2020-04-03 NOTE — Patient Instructions (Signed)
It was great to see you!  I will be in touch with you regarding your lab results and the plan for B12.  I have put in a referral for you to see  Pulmonary. Please call [phone] in a week to schedule an appointment if you haven't heard from them.  Take care,  [name] PA-C

## 2020-04-05 ENCOUNTER — Other Ambulatory Visit: Payer: Self-pay | Admitting: Physician Assistant

## 2020-04-05 MED ORDER — LEVOTHYROXINE SODIUM 75 MCG PO TABS: ORAL_TABLET | ORAL | 2 refills | Status: DC

## 2020-04-05 MED ORDER — SIMVASTATIN 40 MG PO TABS: 40.0000 mg | ORAL_TABLET | Freq: Every day | ORAL | 2 refills | Status: DC

## 2020-04-11 DIAGNOSIS — M9903 Segmental and somatic dysfunction of lumbar region: Secondary | ICD-10-CM | POA: Diagnosis not present

## 2020-04-11 DIAGNOSIS — M9902 Segmental and somatic dysfunction of thoracic region: Secondary | ICD-10-CM | POA: Diagnosis not present

## 2020-04-11 DIAGNOSIS — M9901 Segmental and somatic dysfunction of cervical region: Secondary | ICD-10-CM | POA: Diagnosis not present

## 2020-04-11 DIAGNOSIS — M5033 Other cervical disc degeneration, cervicothoracic region: Secondary | ICD-10-CM | POA: Diagnosis not present

## 2020-04-18 ENCOUNTER — Ambulatory Visit (INDEPENDENT_AMBULATORY_CARE_PROVIDER_SITE_OTHER): Payer: Medicare HMO

## 2020-04-18 ENCOUNTER — Other Ambulatory Visit: Payer: Self-pay

## 2020-04-18 DIAGNOSIS — E538 Deficiency of other specified B group vitamins: Secondary | ICD-10-CM

## 2020-04-18 DIAGNOSIS — M9902 Segmental and somatic dysfunction of thoracic region: Secondary | ICD-10-CM | POA: Diagnosis not present

## 2020-04-18 DIAGNOSIS — M9903 Segmental and somatic dysfunction of lumbar region: Secondary | ICD-10-CM | POA: Diagnosis not present

## 2020-04-18 DIAGNOSIS — M9901 Segmental and somatic dysfunction of cervical region: Secondary | ICD-10-CM | POA: Diagnosis not present

## 2020-04-18 DIAGNOSIS — M5033 Other cervical disc degeneration, cervicothoracic region: Secondary | ICD-10-CM | POA: Diagnosis not present

## 2020-04-18 MED ORDER — CYANOCOBALAMIN 1000 MCG/ML IJ SOLN
1000.0000 ug | Freq: Once | INTRAMUSCULAR | Status: AC
  Administered 2020-04-18: 1000 ug via INTRAMUSCULAR

## 2020-04-18 NOTE — Progress Notes (Addendum)
Per orders of [name], [place], injection of Vitamin B12 given by Tobe Sos in left deltoid. Patient tolerated injection well. Patient will make appointment for 1 month.  I agree with the above information. [name], PA-C

## 2020-04-24 DIAGNOSIS — H6092 Unspecified otitis externa, left ear: Secondary | ICD-10-CM | POA: Diagnosis not present

## 2020-04-30 DIAGNOSIS — M5033 Other cervical disc degeneration, cervicothoracic region: Secondary | ICD-10-CM | POA: Diagnosis not present

## 2020-04-30 DIAGNOSIS — M9902 Segmental and somatic dysfunction of thoracic region: Secondary | ICD-10-CM | POA: Diagnosis not present

## 2020-04-30 DIAGNOSIS — M9903 Segmental and somatic dysfunction of lumbar region: Secondary | ICD-10-CM | POA: Diagnosis not present

## 2020-04-30 DIAGNOSIS — M9901 Segmental and somatic dysfunction of cervical region: Secondary | ICD-10-CM | POA: Diagnosis not present

## 2020-05-07 DIAGNOSIS — R198 Other specified symptoms and signs involving the digestive system and abdomen: Secondary | ICD-10-CM | POA: Diagnosis not present

## 2020-05-07 DIAGNOSIS — K5641 Fecal impaction: Secondary | ICD-10-CM | POA: Diagnosis not present

## 2020-05-07 DIAGNOSIS — Z9049 Acquired absence of other specified parts of digestive tract: Secondary | ICD-10-CM | POA: Diagnosis not present

## 2020-05-07 DIAGNOSIS — K5900 Constipation, unspecified: Secondary | ICD-10-CM | POA: Diagnosis not present

## 2020-05-14 DIAGNOSIS — M9902 Segmental and somatic dysfunction of thoracic region: Secondary | ICD-10-CM | POA: Diagnosis not present

## 2020-05-14 DIAGNOSIS — B86 Scabies: Secondary | ICD-10-CM | POA: Diagnosis not present

## 2020-05-14 DIAGNOSIS — M9901 Segmental and somatic dysfunction of cervical region: Secondary | ICD-10-CM | POA: Diagnosis not present

## 2020-05-14 DIAGNOSIS — D485 Neoplasm of uncertain behavior of skin: Secondary | ICD-10-CM | POA: Diagnosis not present

## 2020-05-14 DIAGNOSIS — L299 Pruritus, unspecified: Secondary | ICD-10-CM | POA: Diagnosis not present

## 2020-05-14 DIAGNOSIS — M5033 Other cervical disc degeneration, cervicothoracic region: Secondary | ICD-10-CM | POA: Diagnosis not present

## 2020-05-14 DIAGNOSIS — L570 Actinic keratosis: Secondary | ICD-10-CM | POA: Diagnosis not present

## 2020-05-14 DIAGNOSIS — M9903 Segmental and somatic dysfunction of lumbar region: Secondary | ICD-10-CM | POA: Diagnosis not present

## 2020-05-16 ENCOUNTER — Ambulatory Visit (INDEPENDENT_AMBULATORY_CARE_PROVIDER_SITE_OTHER): Payer: Medicare HMO

## 2020-05-16 DIAGNOSIS — E538 Deficiency of other specified B group vitamins: Secondary | ICD-10-CM | POA: Diagnosis not present

## 2020-05-16 MED ORDER — CYANOCOBALAMIN 1000 MCG/ML IJ SOLN
1000.0000 ug | Freq: Once | INTRAMUSCULAR | Status: AC
Start: 2020-05-16 — End: 2020-05-16
  Administered 2020-05-16: 1000 ug via INTRAMUSCULAR

## 2020-05-16 NOTE — Progress Notes (Addendum)
[name] 79 y.o. male presents to office today for weekly B12 injection per [name], PA. Administered CYANOCOBALAMIN 1,000 mcg IM right arm. Patient tolerated well.   I agree with the above assessment and plan.  [name] PA-C

## 2020-05-21 DIAGNOSIS — M5033 Other cervical disc degeneration, cervicothoracic region: Secondary | ICD-10-CM | POA: Diagnosis not present

## 2020-05-21 DIAGNOSIS — M9902 Segmental and somatic dysfunction of thoracic region: Secondary | ICD-10-CM | POA: Diagnosis not present

## 2020-05-21 DIAGNOSIS — M9903 Segmental and somatic dysfunction of lumbar region: Secondary | ICD-10-CM | POA: Diagnosis not present

## 2020-05-21 DIAGNOSIS — M9901 Segmental and somatic dysfunction of cervical region: Secondary | ICD-10-CM | POA: Diagnosis not present

## 2020-05-22 ENCOUNTER — Encounter: Payer: Self-pay | Admitting: Physician Assistant

## 2020-05-22 ENCOUNTER — Other Ambulatory Visit: Payer: Self-pay

## 2020-05-22 ENCOUNTER — Ambulatory Visit (INDEPENDENT_AMBULATORY_CARE_PROVIDER_SITE_OTHER): Payer: Medicare HMO | Admitting: Physician Assistant

## 2020-05-22 VITALS — BP 148/80 | HR 57 | Temp 98.7°F | Ht 71.0 in | Wt 200.4 lb

## 2020-05-22 DIAGNOSIS — R208 Other disturbances of skin sensation: Secondary | ICD-10-CM

## 2020-05-22 DIAGNOSIS — R202 Paresthesia of skin: Secondary | ICD-10-CM | POA: Diagnosis not present

## 2020-05-22 DIAGNOSIS — R600 Localized edema: Secondary | ICD-10-CM

## 2020-05-22 DIAGNOSIS — E538 Deficiency of other specified B group vitamins: Secondary | ICD-10-CM | POA: Diagnosis not present

## 2020-05-22 DIAGNOSIS — I10 Essential (primary) hypertension: Secondary | ICD-10-CM | POA: Diagnosis not present

## 2020-05-22 NOTE — Patient Instructions (Signed)
It was great to see you!  A referral has been placed for you to see Dr. Lynne Leader or Dr. Charlann Boxer with [facility]. Someone from there office will be in touch soon regarding your appointment with him. His location: [place] [facility] [address] on the 1st floor.   Phone number [phone], Fax [phone].  This location is across the street from the entrance to [facility] and in the same complex as the [facility] and [facility]  Please call [facility] at: [phone] to schedule your Pulmonology appointment.  Please call Dr. Kenton Kingfisher' office if your blood pressure remains consistently elevated.  Take care,  [name] PA-C

## 2020-05-22 NOTE — Progress Notes (Signed)
[name] is a 79 y.o. male here for a follow up of a pre-existing problem.  I acted as a Education administrator for [employer], PA-C Anselmo Pickler, LPN   History of Present Illness:   Chief Complaint  Patient presents with  . Hypertension    HPI   Decreased sensation to lower legs/Paresthesias He had a facet injection on 08/31/18 and was admitted to OSH for urinary retention and gait instability s/p injection. He had significant relief of back pain from injection. Extensive work-up for his urinary retention and LE paresthesias included lumbar MRI, thoracic MRI, CT and brain MRI without any findings. Since that time, he has decreased sensation on the inside of legs and feet, but not on the outside. At first he thought that this was improving with time but it has seemed to plateau. He discussed this with his neurologist who said that they didn't really know what it is going on. Primary L leg but occasional R leg.   L ankle swelling and bilateral calf swelling Intermittent. Has been going on for 2-3 weeks or so. Notices that it only happens after he sits for a while when his legs are not elevated. Denies calf pain, redness/tenderness. Denies prior L ankle injury.  Hypertension Pt c/o elevated blood pressure x several weeks. Currently on Lisinopril 10 mg daily and Metoprolol 50 mg BID. Blood pressure averaging systolic 123456, diastolic [id number]. Pt has noticed some swelling in his ankles and has SOB with exertion but that is not new. Pt denies headaches, dizziness, blurred vision, chest pain. Denies excessive caffeine intake, stimulant usage, excessive alcohol intake or increase in salt consumption.  He sees Dr. Kenton Kingfisher with [facility] and has not reached out to them about this.    Drinks 2 cups of coffee daily and 8 oz wine nightly.  Vit B12 deficiency History of vitamin B12 deficiency.  He was taking weekly injections, then monthly injections.  He did not respond to oral therapy. He is  concerned because his B12 level is persistently decreased despite supplementation, and also because of his above neurological issues.  Past Medical History:  Diagnosis Date  . Abdominal wall defect, acquired   . Blockage of coronary artery of heart (HCC)    30%  . Hearing aid worn   . Insulin resistance   . LV dysfunction    reduction  . Osteoarthritis   . Pulmonary embolism (HCC)      Social History   Tobacco Use  . Smoking status: Former Smoker    Packs/day: 1.00    Years: 15.00    Pack years: 15.00    Quit date: 12/21/1985    Years since quitting: 34.4  . Smokeless tobacco: Never Used  . Tobacco comment: pack a day   Substance Use Topics  . Alcohol use: No  . Drug use: No    Past Surgical History:  Procedure Laterality Date  . ACNE CYST REMOVAL  2012   Back   . APPENDECTOMY    . CARDIAC CATHETERIZATION    . COLECTOMY  11/2013   [facility], 2nd to diverticulitis  . RT hip replacement    . TONSILLECTOMY      Family History  Problem Relation Age of Onset  . Heart disease Mother        pacemaker    Allergies  Allergen Reactions  . Tramadol Other (See Comments)    Dizziness  . Flagyl [Metronidazole] Nausea And Vomiting    States IV form causes to allergy  .  Flomax [Tamsulosin Hcl] Other (See Comments)    Pre-syncope  . Lipitor [Atorvastatin] Other (See Comments)    Myalgia    Current Medications:   Current Outpatient Medications:  .  apixaban (ELIQUIS) 5 MG TABS tablet, Take 5 mg by mouth 2 (two) times daily. , Disp: , Rfl:  .  levothyroxine (SYNTHROID) 75 MCG tablet, Take daily in AM on empty stomach., Disp: 90 tablet, Rfl: 2 .  lisinopril (PRINIVIL,ZESTRIL) 10 MG tablet, Take by mouth., Disp: , Rfl:  .  meclizine (ANTIVERT) 25 MG tablet, Take by mouth., Disp: , Rfl:  .  meloxicam (MOBIC) 15 MG tablet, Take 1 tablet by mouth once daily, Disp: 90 tablet, Rfl: 0 .  metoprolol tartrate (LOPRESSOR) 50 MG tablet, Take 50 mg by mouth 2 (two) times daily., Disp:  , Rfl:  .  omeprazole (PRILOSEC) 10 MG capsule, , Disp: , Rfl:  .  simvastatin (ZOCOR) 40 MG tablet, Take 1 tablet (40 mg total) by mouth daily., Disp: 90 tablet, Rfl: 2   Review of Systems:   ROS Negative unless otherwise specified per HPI.  Vitals:   Vitals:   05/22/20 1102  BP: (!) 148/80  Pulse: (!) 57  Temp: 98.7 F (37.1 C)  TempSrc: Temporal  SpO2: 97%  Weight: 200 lb 6.1 oz (90.9 kg)  Height: 5\' 11"  (1.803 m)     Body mass index is 27.95 kg/m.  Physical Exam:   Physical Exam Vitals and nursing note reviewed.  Constitutional:      General: He is not in acute distress.    Appearance: He is well-developed. He is not ill-appearing or toxic-appearing.  Cardiovascular:     Rate and Rhythm: Normal rate and regular rhythm.     Pulses: Normal pulses.     Heart sounds: Normal heart sounds, S1 normal and S2 normal.     Comments: No LE edema Pulmonary:     Effort: Pulmonary effort is normal.     Breath sounds: Normal breath sounds.  Skin:    General: Skin is warm and dry.  Neurological:     Mental Status: He is alert.     GCS: GCS eye subscore is 4. GCS verbal subscore is 5. GCS motor subscore is 6.     Cranial Nerves: Cranial nerves are intact.     Sensory: Sensation is intact.     Motor: Motor function is intact.     Coordination: Coordination is intact.  Psychiatric:        Speech: Speech normal.        Behavior: Behavior normal. Behavior is cooperative.     Assessment and Plan:   Wensley was seen today for hypertension.  Diagnoses and all orders for this visit:  Decreased sensation of lower extremity; Paresthesias I am unsure of ways to address this other than offering a second opinion, and he is agreeable to a referral to one of our sports medicine doctors for further evaluation and possible intervention for this. -     Ambulatory referral to Sports Medicine  Leg edema Dependent edema, is always resolved with elevation of legs.  I recommended  consistent elevation of legs.  Also recommended reduction of salt.  If any worsening symptoms or lack of improvement with these measures, follow-up with [place] or cardiology.  Essential hypertension Patient declines any changes to medications at this time and would like to speak to cardiology if he were to make any medication changes.  Encouraged him to call Dr. Doreene Adas office if  he has persistent readings greater than 150/90.  B12 deficiency Will refer to hematology for further evaluation of his B12 deficiency to see if there is further explanation is why his readings are persistently on the lower end despite adequate supplementation, given that he is having also the neurological issues above. -     Ambulatory referral to Hematology  . Reviewed expectations re: course of current medical issues. . Discussed self-management of symptoms. . Outlined signs and symptoms indicating need for more acute intervention. . Patient verbalized understanding and all questions were answered. . See orders for this visit as documented in the electronic medical record. . Patient received an After-Visit Summary.  CMA or LPN served as scribe during this visit. History, Physical, and Plan performed by medical provider. The above documentation has been reviewed and is accurate and complete.  [name], PA-C

## 2020-05-28 DIAGNOSIS — M5033 Other cervical disc degeneration, cervicothoracic region: Secondary | ICD-10-CM | POA: Diagnosis not present

## 2020-05-28 DIAGNOSIS — M9903 Segmental and somatic dysfunction of lumbar region: Secondary | ICD-10-CM | POA: Diagnosis not present

## 2020-05-28 DIAGNOSIS — M9901 Segmental and somatic dysfunction of cervical region: Secondary | ICD-10-CM | POA: Diagnosis not present

## 2020-05-28 DIAGNOSIS — M9902 Segmental and somatic dysfunction of thoracic region: Secondary | ICD-10-CM | POA: Diagnosis not present

## 2020-05-29 ENCOUNTER — Other Ambulatory Visit: Payer: Self-pay

## 2020-05-29 ENCOUNTER — Encounter: Payer: Self-pay | Admitting: Family Medicine

## 2020-05-29 ENCOUNTER — Ambulatory Visit: Payer: Medicare HMO | Admitting: Family Medicine

## 2020-05-29 VITALS — BP 110/70 | HR 66 | Ht 71.0 in | Wt 199.6 lb

## 2020-05-29 DIAGNOSIS — R252 Cramp and spasm: Secondary | ICD-10-CM | POA: Diagnosis not present

## 2020-05-29 DIAGNOSIS — R202 Paresthesia of skin: Secondary | ICD-10-CM

## 2020-05-29 MED ORDER — BACLOFEN 10 MG PO TABS
10.0000 mg | ORAL_TABLET | Freq: Every evening | ORAL | 1 refills | Status: DC | PRN
Start: 2020-05-29 — End: 2020-06-27

## 2020-05-29 NOTE — Progress Notes (Signed)
Subjective:    CC: B LE paresthesias  I, [name], LAT, ATC, am serving as scribe for Dr. Lynne Leader.  HPI: Pt is a 79 y/o male presenting w/ c/o B LE paresthesias, particularly along his lateral legs x 1.5 years.  He has had significant work-up over the years to include an L-spine MRI in 01/31/13, L L5-S1 epidural on 02/09/13, c-spine MRI on 03/22/17, brain MRI on 07/13/18 and head and neck angiogram on 08/15/18.  He states that he had a lumbar facet injection L4-L5 and L5-S1 August 31, 2018 to help w/ his low back pain and about 3 days after this procedure, he noted a warm sensation running down both legs.  He then went to the ED due to these con't issues w/ sensation in his legs and loss of bladder control.  This then progressed to loss of strength in his legs w/ an inability to walk.  He was hospitalized and had repeat MRI lumbar spine which did not show hematoma or significant new abnormality in lumbar spine.  He regained his ability to walk but con't to have issues w/ his sensation along his lateral legs, particularly the lower legs, L>R.  He notes numbness and tingling in the lateral aspect of his legs bilaterally extending to the lateral calf and anterior/dorsal foot.  Additionally notes cramping sensation in his legs at bedtime.  This is been ongoing for a few weeks as well.  Radiating pain: No pain, just discomfort Numbness/tingling: yes in B LEs Aggravating factors: standing   Pertinent review of Systems: No fevers or chills  Relevant historical information: Heart disease with cardiomyopathy and CAD.   Objective:    Vitals:   05/29/20 0944  BP: 110/70  Pulse: 66  SpO2: 96%   General: Well Developed, well nourished, and in no acute distress.   MSK: Lower extremities normal-appearing normal motion normal strength.  Sensation decreased or different to light touch lateral thighs and calves bilaterally. Normal gait.  Lab and Radiology Results  Lumbar Spine MRI  09/06/18 Interface, Rad Results In - 09/06/2018 12:24 PM EDT CLINICAL DATA:  79 year old male with bilateral lower extremity ataxia, urinary retention. History of atrial fibrillation, lumbar degenerative disease status post recent L4-L5 and L5-S1 facet injections on 08/31/2018.  EXAM: MRI LUMBAR SPINE WITHOUT AND WITH CONTRAST  TECHNIQUE: Multiplanar and multiecho pulse sequences of the lumbar spine were obtained without and with intravenous contrast.  CONTRAST:  17 milliliters MultiHance in conjunction with contrast enhanced imaging of the brain reported separately.  COMPARISON:  Thoracic spine MRI today reported separately. Lumbar MRI 07/24/2018 and earlier.  FINDINGS: Segmentation: Lumbar segmentation appears to be normal and is the same numbering system on the August MRI.  Alignment: Stable and normal lumbar vertebral height and alignment with relatively preserved lordosis.  Vertebrae: No marrow edema or evidence of acute osseous abnormality. Visualized bone marrow signal is within normal limits. Intact visible sacrum and SI joints.  Conus medullaris and cauda equina: Conus extends to the T12-L1 level. No lower spinal cord or conus signal abnormality. No abnormal intradural enhancement. No dural thickening.  Paraspinal and other soft tissues: Stable and negative.  Disc levels:  T12-L1:  Negative.  L1-L2: Minimal posterior disc bulge with mild facet and ligament flavum hypertrophy. No stenosis.  L2-L3: Mild multifactorial spinal stenosis related to posterior disc bulging with small annular fissure, mild to moderate facet and ligament flavum hypertrophy, mild epidural lipomatosis. No foraminal stenosis. This level is stable.  L3-L4: Moderate multifactorial  spinal stenosis related to circumferential disc bulge with posterior annular fissure, moderate facet and ligament flavum hypertrophy, mild epidural lipomatosis. Mild associated lateral recess and foraminal  stenosis appears greater on the left. This level is stable.  L4-L5: Chronic far lateral disc bulging and small superimposed central disc protrusion (series 5, image 25). Stable mild to moderate facet and ligament flavum hypertrophy. Mild left lateral recess stenosis without spinal or foraminal stenosis. This level is stable.  L5-S1: Circumferential and mostly left far lateral disc bulge and endplate spurring with mild facet hypertrophy greater on the right. No spinal or lateral recess stenosis. Stable mild left L5 foraminal stenosis.  IMPRESSION: 1. No acute or inflammatory findings. Stable MRI appearance of the lumbar spine since August 2019. 2. Degenerative multifactorial moderate spinal stenosis at L3-L4 with left lateral recess and left foraminal stenosis. Mild spinal stenosis at L2-L3. Mild left lateral recess stenosis at L4-L5.   Electronically Signed   By: Genevie Ann M.D.   On: 09/06/2018 12:21   Impression and Recommendations:    Assessment and Plan: 79 y.o. male with lower extremity paresthesias starting after lumbar facet injection in September 2019.  Repeat MRI directly after patient symptoms started on September 17 did not show much new abnormality but did show spinal stenosis at L3-4. His paresthesias are in an L5 distribution.  Fortunately he is not having significant weakness at this time.  Although his symptoms have been ongoing for quite a while.  Plan for nerve conduction study to further evaluate for potential causes of paresthesia.  Would consider trial of gabapentin or Lyrica but his symptoms are not bad enough to warrant it after discussion of side effects.  Additionally he notes cramping.  He had recent lab work-up that was relatively normal.  Plan for limited trial of low-dose baclofen at bedtime.  Recheck following nerve conduction study.[name]  PDMP not reviewed this encounter. Orders Placed This Encounter  Procedures  . Ambulatory referral to Neurology     Referral Priority:   Routine    Referral Type:   Consultation    Referral Reason:   Specialty Services Required    Requested Specialty:   Neurology    Number of Visits Requested:   1  . NCV with EMG(electromyography)    Standing Status:   Future    Standing Expiration Date:   05/29/2021    Order Specific Question:   Where should this test be performed?    Answer:   [facility]   Meds ordered this encounter  Medications  . baclofen (LIORESAL) 10 MG tablet    Sig: Take 1 tablet (10 mg total) by mouth at bedtime as needed for muscle spasms.    Dispense:  30 each    Refill:  1    Discussed warning signs or symptoms. Please see discharge instructions. Patient expresses understanding.   The above documentation has been reviewed and is accurate and complete Lynne Leader, M.D.

## 2020-05-29 NOTE — Patient Instructions (Signed)
Thank you for coming in today. Plan for nerve study at neurology.  Let me know if you do not get a phone call to schedule.  Recheck with me in about 1-2 weeks after the nerve study.   Paresthesia Paresthesia is an abnormal burning or prickling sensation. It is usually felt in the hands, arms, legs, or feet. However, it may occur in any part of the body. Usually, paresthesia is not painful. It may feel like:  Tingling or numbness.  Buzzing.  Itching. Paresthesia may occur without any clear cause, or it may be caused by:  Breathing too quickly (hyperventilation).  Pressure on a nerve.  An underlying medical condition.  Side effects of a medication.  Nutritional deficiencies.  Exposure to toxic chemicals. Most people experience temporary (transient) paresthesia at some time in their lives. For some people, it may be long-lasting (chronic) because of an underlying medical condition. If you have paresthesia that lasts a long time, you may need to be evaluated by your health care provider. Follow these instructions at home: Alcohol use   Do not drink alcohol if: ? Your health care provider tells you not to drink. ? You are pregnant, may be pregnant, or are planning to become pregnant.  If you drink alcohol: ? Limit how much you use to:  0-1 drink a day for women.  0-2 drinks a day for men. ? Be aware of how much alcohol is in your drink. In the [place] one drink equals one 12 oz bottle of beer (355 mL), one 5 oz glass of wine (148 mL), or one 1 oz glass of hard liquor (44 mL). Nutrition   Eat a healthy diet. This includes: ? Eating foods that are high in fiber, such as fresh fruits and vegetables, whole grains, and beans. ? Limiting foods that are high in fat and processed sugars, such as fried or sweet foods. General instructions  Take over-the-counter and prescription medicines only as told by your health care provider.  Do not use any products that contain nicotine or  tobacco, such as cigarettes and e-cigarettes. These can keep blood from reaching damaged nerves. If you need help quitting, ask your health care provider.  If you have diabetes, work closely with your health care provider to keep your blood sugar under control.  If you have numbness in your feet: ? Check every day for signs of injury or infection. Watch for redness, warmth, and swelling. ? Wear padded socks and comfortable shoes. These help protect your feet.  Keep all follow-up visits as told by your health care provider. This is important. Contact a health care provider if you:  Have paresthesia that gets worse or does not go away.  Have a burning or prickling feeling that gets worse when you walk.  Have pain, cramps, or dizziness.  Develop a rash. Get help right away if you:  Feel weak.  Have trouble walking or moving.  Have problems with speech, understanding, or vision.  Feel confused.  Cannot control your bladder or bowel movements.  Have numbness after an injury.  Develop new weakness in an arm or leg.  Faint. Summary  Paresthesia is an abnormal burning or prickling sensation that is usually felt in the hands, arms, legs, or feet. It may also occur in other parts of the body.  Paresthesia may occur without any clear cause, or it may be caused by breathing too quickly (hyperventilation), pressure on a nerve, an underlying medical condition, side effects of a medication,  nutritional deficiencies, or exposure to toxic chemicals.  If you have paresthesia that lasts a long time, you may need to be evaluated by your health care provider. This information is not intended to replace advice given to you by your health care provider. Make sure you discuss any questions you have with your health care provider. Document Revised: 01/02/2019 Document Reviewed: 12/16/2017 Elsevier Patient Education  2020 [employer].

## 2020-06-05 DIAGNOSIS — K219 Gastro-esophageal reflux disease without esophagitis: Secondary | ICD-10-CM | POA: Diagnosis not present

## 2020-06-05 DIAGNOSIS — R490 Dysphonia: Secondary | ICD-10-CM | POA: Diagnosis not present

## 2020-06-07 DIAGNOSIS — R69 Illness, unspecified: Secondary | ICD-10-CM | POA: Diagnosis not present

## 2020-06-11 DIAGNOSIS — M5033 Other cervical disc degeneration, cervicothoracic region: Secondary | ICD-10-CM | POA: Diagnosis not present

## 2020-06-11 DIAGNOSIS — M9901 Segmental and somatic dysfunction of cervical region: Secondary | ICD-10-CM | POA: Diagnosis not present

## 2020-06-11 DIAGNOSIS — M9902 Segmental and somatic dysfunction of thoracic region: Secondary | ICD-10-CM | POA: Diagnosis not present

## 2020-06-11 DIAGNOSIS — M9903 Segmental and somatic dysfunction of lumbar region: Secondary | ICD-10-CM | POA: Diagnosis not present

## 2020-06-18 ENCOUNTER — Ambulatory Visit (INDEPENDENT_AMBULATORY_CARE_PROVIDER_SITE_OTHER): Payer: Medicare HMO

## 2020-06-18 ENCOUNTER — Other Ambulatory Visit: Payer: Self-pay

## 2020-06-18 DIAGNOSIS — I428 Other cardiomyopathies: Secondary | ICD-10-CM | POA: Diagnosis not present

## 2020-06-18 DIAGNOSIS — R0600 Dyspnea, unspecified: Secondary | ICD-10-CM | POA: Diagnosis not present

## 2020-06-18 DIAGNOSIS — E538 Deficiency of other specified B group vitamins: Secondary | ICD-10-CM

## 2020-06-18 DIAGNOSIS — I480 Paroxysmal atrial fibrillation: Secondary | ICD-10-CM | POA: Diagnosis not present

## 2020-06-18 DIAGNOSIS — I2510 Atherosclerotic heart disease of native coronary artery without angina pectoris: Secondary | ICD-10-CM | POA: Diagnosis not present

## 2020-06-18 DIAGNOSIS — I491 Atrial premature depolarization: Secondary | ICD-10-CM | POA: Diagnosis not present

## 2020-06-18 DIAGNOSIS — R5383 Other fatigue: Secondary | ICD-10-CM | POA: Diagnosis not present

## 2020-06-18 MED ORDER — CYANOCOBALAMIN 1000 MCG/ML IJ SOLN
1000.0000 ug | Freq: Once | INTRAMUSCULAR | Status: AC
  Administered 2020-06-18: 1000 ug via INTRAMUSCULAR

## 2020-06-18 NOTE — Progress Notes (Addendum)
Per orders of [name], [place], injection of B-12 given by Francella Solian in left deltoid. Patient tolerated injection well. Patient will make appointment for 1 month.   I agree with the above assessment and plan.  [name] PA-C 06/18/20

## 2020-06-21 ENCOUNTER — Other Ambulatory Visit: Payer: Self-pay | Admitting: Physician Assistant

## 2020-06-25 DIAGNOSIS — R0600 Dyspnea, unspecified: Secondary | ICD-10-CM | POA: Diagnosis not present

## 2020-06-25 DIAGNOSIS — I480 Paroxysmal atrial fibrillation: Secondary | ICD-10-CM | POA: Diagnosis not present

## 2020-06-27 ENCOUNTER — Other Ambulatory Visit: Payer: Self-pay | Admitting: Family

## 2020-06-27 ENCOUNTER — Encounter: Payer: Self-pay | Admitting: Family

## 2020-06-27 ENCOUNTER — Other Ambulatory Visit: Payer: Self-pay

## 2020-06-27 ENCOUNTER — Telehealth: Payer: Self-pay | Admitting: Family

## 2020-06-27 ENCOUNTER — Inpatient Hospital Stay: Payer: Medicare HMO | Attending: Family | Admitting: Family

## 2020-06-27 ENCOUNTER — Inpatient Hospital Stay: Payer: Medicare HMO

## 2020-06-27 VITALS — BP 125/48 | HR 90 | Temp 98.3°F | Resp 20 | Ht 71.0 in | Wt 193.1 lb

## 2020-06-27 DIAGNOSIS — E538 Deficiency of other specified B group vitamins: Secondary | ICD-10-CM

## 2020-06-27 DIAGNOSIS — Z87891 Personal history of nicotine dependence: Secondary | ICD-10-CM

## 2020-06-27 DIAGNOSIS — R9430 Abnormal result of cardiovascular function study, unspecified: Secondary | ICD-10-CM | POA: Diagnosis not present

## 2020-06-27 DIAGNOSIS — R0602 Shortness of breath: Secondary | ICD-10-CM | POA: Diagnosis not present

## 2020-06-27 DIAGNOSIS — R0609 Other forms of dyspnea: Secondary | ICD-10-CM

## 2020-06-27 DIAGNOSIS — M62838 Other muscle spasm: Secondary | ICD-10-CM | POA: Diagnosis not present

## 2020-06-27 LAB — CMP (CANCER CENTER ONLY)
ALT: 16 U/L (ref 0–44)
AST: 18 U/L (ref 15–41)
Albumin: 4.7 g/dL (ref 3.5–5.0)
Alkaline Phosphatase: 39 U/L (ref 38–126)
Anion gap: 6 (ref 5–15)
BUN: 23 mg/dL (ref 8–23)
CO2: 27 mmol/L (ref 22–32)
Calcium: 9.8 mg/dL (ref 8.9–10.3)
Chloride: 106 mmol/L (ref 98–111)
Creatinine: 1.29 mg/dL — ABNORMAL HIGH (ref 0.61–1.24)
GFR, Est AFR Am: >60 mL/min (ref >60)
GFR, Estimated: 53 mL/min — ABNORMAL LOW (ref >60)
Glucose, Bld: 128 mg/dL — ABNORMAL HIGH (ref 70–99)
Potassium: 5 mmol/L (ref 3.5–5.1)
Sodium: 139 mmol/L (ref 135–145)
Total Bilirubin: 0.8 mg/dL (ref 0.3–1.2)
Total Protein: 7 g/dL (ref 6.5–8.1)

## 2020-06-27 LAB — CBC WITH DIFFERENTIAL (CANCER CENTER ONLY)
Abs Immature Granulocytes: 0.03 10*3/uL (ref 0.00–0.07)
Basophils Absolute: 0.1 10*3/uL (ref 0.0–0.1)
Basophils Relative: 1 %
Eosinophils Absolute: 0.1 10*3/uL (ref 0.0–0.5)
Eosinophils Relative: 1 %
HCT: 42.6 % (ref 39.0–52.0)
Hemoglobin: 14.1 g/dL (ref 13.0–17.0)
Immature Granulocytes: 0 %
Lymphocytes Relative: 18 %
Lymphs Abs: 1.7 10*3/uL (ref 0.7–4.0)
MCH: 30.3 pg (ref 26.0–34.0)
MCHC: 33.1 g/dL (ref 30.0–36.0)
MCV: 91.4 fL (ref 80.0–100.0)
Monocytes Absolute: 0.7 10*3/uL (ref 0.1–1.0)
Monocytes Relative: 8 %
Neutro Abs: 6.9 10*3/uL (ref 1.7–7.7)
Neutrophils Relative %: 72 %
Platelet Count: 216 10*3/uL (ref 150–400)
RBC: 4.66 MIL/uL (ref 4.22–5.81)
RDW: 12.7 % (ref 11.5–15.5)
WBC Count: 9.5 10*3/uL (ref 4.0–10.5)
nRBC: 0 % (ref 0.0–0.2)

## 2020-06-27 LAB — LACTATE DEHYDROGENASE: LDH: 245 U/L — ABNORMAL HIGH (ref 98–192)

## 2020-06-27 LAB — SAVE SMEAR(SSMR), FOR PROVIDER SLIDE REVIEW

## 2020-06-27 LAB — FOLATE: Folate: 15.8 ng/mL (ref >5.9)

## 2020-06-27 LAB — VITAMIN B12: Vitamin B-12: 691 pg/mL (ref 180–914)

## 2020-06-27 NOTE — Telephone Encounter (Signed)
Called and LMVM for patient with updated appointment info per 7/8 los

## 2020-06-27 NOTE — Progress Notes (Signed)
Hematology/Oncology Consultation   Name: [name]      MRN: [id number]    Location: Room/bed info not found  Date: 06/27/2020 Time:11:33 AM   REFERRING PHYSICIAN: [name], PA  REASON FOR CONSULT: B12 deficiency    DIAGNOSIS: B12 deficiency   HISTORY OF PRESENT ILLNESS: [name] is a very pleasant 79 yo [place] gentleman with history of B12 deficiency (diagnosed in 2019). He failed treatment with an oral supplement and is now on monthly injections. His most recent B 12 level was 350. He is symptomatic with weakness, fatigued and occasional nausea.  No family history of B12 deficiency that he is aware of.  No cancer history.  He had what is felt to have been a spinal stroke in 2019 after getting a lumbar spinal block for back pain which caused temporary paralysis of his legs and bladder. He had been taken off of Eliquis for the procedure which may have contributed.  It appears that he is on Eliquis for atrial fib.  He has had no issues with bleeding. No bruising or petechiae.  He has SOB with any exertion. He attributes this to his heart issues but also has a new patient appointment with pulmonology next week for further evaluation for an underlying lung issue.  ECHO in March showed an EF of 40-45%. He states that he had a stress test recently which was negative.  No fever, chills, n/v, cough, rash, dizziness, chest pain, palpitations, abdominal pain or changes in bowel or bladder habits.  No swelling or tenderness in his extremities.  No falls or syncopal episodes to report.  He has maintained a good appetite and is staying well hydrated. His weight is stable.  He denies smoking, ETOH use or recreational drug use.  He is originally from [place], [place] and moved to the [place] in the late 1960's.  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   ROS: All other 10 point review of systems is negative.   PAST MEDICAL HISTORY:   Past Medical History:   Diagnosis Date  . Abdominal wall defect, acquired   . Blockage of coronary artery of heart (HCC)    30%  . Hearing aid worn   . Insulin resistance   . LV dysfunction    reduction  . Osteoarthritis   . Pulmonary embolism (HCC)     ALLERGIES: Allergies  Allergen Reactions  . Tramadol Other (See Comments)    Dizziness  . Flagyl [Metronidazole] Nausea And Vomiting    States IV form causes to allergy  . Flomax [Tamsulosin Hcl] Other (See Comments)    Pre-syncope  . Lipitor [Atorvastatin] Other (See Comments)    Myalgia      MEDICATIONS:  Current Outpatient Medications on File Prior to Visit  Medication Sig Dispense Refill  . apixaban (ELIQUIS) 5 MG TABS tablet Take 5 mg by mouth 2 (two) times daily.     [name] levothyroxine (SYNTHROID) 75 MCG tablet Take daily in AM on empty stomach. 90 tablet 2  . lisinopril (PRINIVIL,ZESTRIL) 10 MG tablet Take by mouth.    . meclizine (ANTIVERT) 25 MG tablet Take by mouth.    . meloxicam (MOBIC) 15 MG tablet Take 1 tablet by mouth once daily 90 tablet 0  . metoprolol tartrate (LOPRESSOR) 50 MG tablet Take 50 mg by mouth 2 (two) times daily.    [name] omeprazole (PRILOSEC) 10 MG capsule daily.     . simvastatin (ZOCOR) 40 MG tablet Take 1 tablet (  40 mg total) by mouth daily. 90 tablet 2   No current facility-administered medications on file prior to visit.     PAST SURGICAL HISTORY Past Surgical History:  Procedure Laterality Date  . ACNE CYST REMOVAL  2012   Back   . APPENDECTOMY    . CARDIAC CATHETERIZATION    . COLECTOMY  11/2013   [facility], 2nd to diverticulitis  . RT hip replacement    . TONSILLECTOMY      FAMILY HISTORY: Family History  Problem Relation Age of Onset  . Heart disease Mother        pacemaker    SOCIAL HISTORY:  reports that he quit smoking about 34 years ago. He has a 15.00 pack-year smoking history. He has never used smokeless tobacco. He reports that he does not drink alcohol and does not use drugs.  PERFORMANCE  STATUS: The patient's performance status is 1 - Symptomatic but completely ambulatory  PHYSICAL EXAM: Most Recent Vital Signs: Blood pressure (!) 125/48, pulse 90, temperature 98.3 F (36.8 C), temperature source Oral, resp. rate 20, height 5\' 11"  (1.803 m), weight 193 lb 1.3 oz (87.6 kg), SpO2 99 %. BP (!) 125/48 (BP Location: Right Arm, Patient Position: Sitting)   Pulse 90   Temp 98.3 F (36.8 C) (Oral)   Resp 20   Ht 5\' 11"  (1.803 m)   Wt 193 lb 1.3 oz (87.6 kg)   SpO2 99%   BMI 26.93 kg/m   General Appearance:    Alert, cooperative, no distress, appears stated age  Head:    Normocephalic, without obvious abnormality, atraumatic  Eyes:    PERRL, conjunctiva/corneas clear, EOM's intact, fundi    benign, both eyes             Throat:   Lips, mucosa, and tongue normal; teeth and gums normal  Neck:   Supple, symmetrical, trachea midline, no adenopathy;       thyroid:  No enlargement/tenderness/nodules; no carotid   bruit or JVD  Back:     Symmetric, no curvature, ROM normal, no CVA tenderness  Lungs:     Clear to auscultation bilaterally, respirations unlabored  Chest wall:    No tenderness or deformity  Heart:    Regular rate and rhythm, S1 and S2 normal, no murmur, rub   or gallop  Abdomen:     Soft, non-tender, bowel sounds active all four quadrants,    no masses, no organomegaly        Extremities:   Extremities normal, atraumatic, no cyanosis or edema  Pulses:   2+ and symmetric all extremities  Skin:   Skin color, texture, turgor normal, no rashes or lesions  Lymph nodes:   Cervical, supraclavicular, and axillary nodes normal  Neurologic:   CNII-XII intact. Normal strength, sensation and reflexes      throughout    LABORATORY DATA:  Results for orders placed or performed in visit on 06/27/20 (from the past 48 hour(s))  CMP ([place] only)     Status: Abnormal   Collection Time: 06/27/20 10:53 AM  Result Value Ref Range   Sodium 139 135 - 145 mmol/L    Potassium 5.0 3.5 - 5.1 mmol/L   Chloride 106 98 - 111 mmol/L   CO2 27 22 - 32 mmol/L   Glucose, Bld 128 (H) 70 - 99 mg/dL    Comment: Glucose reference range applies only to samples taken after fasting for at least 8 hours.   BUN 23 8 - 23  mg/dL   Creatinine 1.29 (H) 0.61 - 1.24 mg/dL   Calcium 9.8 8.9 - 10.3 mg/dL   Total Protein 7.0 6.5 - 8.1 g/dL   Albumin 4.7 3.5 - 5.0 g/dL   AST 18 15 - 41 U/L   ALT 16 0 - 44 U/L   Alkaline Phosphatase 39 38 - 126 U/L   Total Bilirubin 0.8 0.3 - 1.2 mg/dL   GFR, Est Non Af Am 53 (L) >60 mL/min   GFR, Est AFR Am >60 >60 mL/min   Anion gap 6 5 - 15    Comment: Performed at [facility] at [facility], [address] [place], [place] [address]  Save Smear [facility])     Status: None   Collection Time: 06/27/20 10:53 AM  Result Value Ref Range   Smear Review SMEAR STAINED AND AVAILABLE FOR REVIEW     Comment: Performed at [facility] Lab at [facility], [address] [place], [place] [address]  CBC with Differential ([place] Only)     Status: None   Collection Time: 06/27/20 10:53 AM  Result Value Ref Range   WBC Count 9.5 4.0 - 10.5 K/uL   RBC 4.66 4.22 - 5.81 MIL/uL   Hemoglobin 14.1 13.0 - 17.0 g/dL   HCT 42.6 39 - 52 %   MCV 91.4 80.0 - 100.0 fL   MCH 30.3 26.0 - 34.0 pg   MCHC 33.1 30.0 - 36.0 g/dL   RDW 12.7 11.5 - 15.5 %   Platelet Count 216 150 - 400 K/uL   nRBC 0.0 0.0 - 0.2 %   Neutrophils Relative % 72 %   Neutro Abs 6.9 1.7 - 7.7 K/uL   Lymphocytes Relative 18 %   Lymphs Abs 1.7 0.7 - 4.0 K/uL   Monocytes Relative 8 %   Monocytes Absolute 0.7 0 - 1 K/uL   Eosinophils Relative 1 %   Eosinophils Absolute 0.1 0 - 0 K/uL   Basophils Relative 1 %   Basophils Absolute 0.1 0 - 0 K/uL   Immature Granulocytes 0 %   Abs Immature Granulocytes 0.03 0.00 - 0.07 K/uL    Comment: Performed at [facility] at [facility], [address] [place],  [place] [address]      RADIOGRAPHY: No results found.     PATHOLOGY: None  ASSESSMENT/PLAN: Mr. Glendinning is a very pleasant 79 yo [place] gentleman with history of B12 deficiency (diagnosed in 2019).  He did not have any luck with oral supplements and is currently on monthly injections.  We will get him set up here now for injections. His last shot was last week so we will get him scheduled for injections here.  B12 and folate level are pending.  We will plan to see him again for follow-up in 2 months.   All questions were answered and he is in agreement with the plan. They can contact our office with any questions or concerns. We can certainly see him sooner if needed.   He was discussed with and also seen by Dr. Marin Olp and he is in agreement with the aforementioned.   Laverna Peace, NP    ADDENDUM: I saw Mr. Depree with [facility].  He comes in with his wife.  He has a history of B12 deficiency.  His B12 level has been okay.  Today, his B12 level is 691.  Under the microscope, I do not see  megaloblastic changes.  I guess the real question is what is causing the B12 deficiency.  Does he have antiparietal cell antibodies or does he have intrinsic factor antibodies?  We will keep him on his monthly B12 injections.  Again, we will plan to get him back to see [place] in 2 months.  When we see him back, we will order the parietal cell antibodies and the intrinsic factor antibodies.  We spent about 45 minutes with Mr. Faulkenberry and his wife.  He is very very nice.  It was a true pleasure talking with he and his wife.  Lattie Haw, MD

## 2020-07-02 DIAGNOSIS — R0602 Shortness of breath: Secondary | ICD-10-CM | POA: Diagnosis not present

## 2020-07-04 ENCOUNTER — Encounter: Payer: Self-pay | Admitting: Family

## 2020-07-09 DIAGNOSIS — M9901 Segmental and somatic dysfunction of cervical region: Secondary | ICD-10-CM | POA: Diagnosis not present

## 2020-07-09 DIAGNOSIS — M5033 Other cervical disc degeneration, cervicothoracic region: Secondary | ICD-10-CM | POA: Diagnosis not present

## 2020-07-09 DIAGNOSIS — M9903 Segmental and somatic dysfunction of lumbar region: Secondary | ICD-10-CM | POA: Diagnosis not present

## 2020-07-09 DIAGNOSIS — M9902 Segmental and somatic dysfunction of thoracic region: Secondary | ICD-10-CM | POA: Diagnosis not present

## 2020-07-10 DIAGNOSIS — R42 Dizziness and giddiness: Secondary | ICD-10-CM | POA: Diagnosis not present

## 2020-07-10 DIAGNOSIS — I10 Essential (primary) hypertension: Secondary | ICD-10-CM | POA: Diagnosis not present

## 2020-07-10 DIAGNOSIS — R9430 Abnormal result of cardiovascular function study, unspecified: Secondary | ICD-10-CM | POA: Diagnosis not present

## 2020-07-10 DIAGNOSIS — H539 Unspecified visual disturbance: Secondary | ICD-10-CM | POA: Diagnosis not present

## 2020-07-16 DIAGNOSIS — I2510 Atherosclerotic heart disease of native coronary artery without angina pectoris: Secondary | ICD-10-CM | POA: Diagnosis not present

## 2020-07-16 DIAGNOSIS — Z86711 Personal history of pulmonary embolism: Secondary | ICD-10-CM | POA: Diagnosis not present

## 2020-07-16 DIAGNOSIS — J929 Pleural plaque without asbestos: Secondary | ICD-10-CM | POA: Diagnosis not present

## 2020-07-16 DIAGNOSIS — R0989 Other specified symptoms and signs involving the circulatory and respiratory systems: Secondary | ICD-10-CM | POA: Diagnosis not present

## 2020-07-18 ENCOUNTER — Ambulatory Visit: Payer: Medicare HMO

## 2020-07-19 ENCOUNTER — Telehealth: Payer: Self-pay | Admitting: Physician Assistant

## 2020-07-19 DIAGNOSIS — R42 Dizziness and giddiness: Secondary | ICD-10-CM | POA: Diagnosis not present

## 2020-07-19 NOTE — Telephone Encounter (Signed)
Received a medical release form from [facility] on 07/15/20, faxed to medical records on 07/19/20.

## 2020-07-22 DIAGNOSIS — E261 Secondary hyperaldosteronism: Secondary | ICD-10-CM | POA: Diagnosis not present

## 2020-07-22 DIAGNOSIS — I429 Cardiomyopathy, unspecified: Secondary | ICD-10-CM | POA: Diagnosis not present

## 2020-07-22 DIAGNOSIS — I509 Heart failure, unspecified: Secondary | ICD-10-CM | POA: Diagnosis not present

## 2020-07-22 DIAGNOSIS — I4891 Unspecified atrial fibrillation: Secondary | ICD-10-CM | POA: Diagnosis not present

## 2020-07-22 DIAGNOSIS — E039 Hypothyroidism, unspecified: Secondary | ICD-10-CM | POA: Diagnosis not present

## 2020-07-22 DIAGNOSIS — D6869 Other thrombophilia: Secondary | ICD-10-CM | POA: Diagnosis not present

## 2020-07-22 DIAGNOSIS — E785 Hyperlipidemia, unspecified: Secondary | ICD-10-CM | POA: Diagnosis not present

## 2020-07-22 DIAGNOSIS — I471 Supraventricular tachycardia: Secondary | ICD-10-CM | POA: Diagnosis not present

## 2020-07-22 DIAGNOSIS — I110 Hypertensive heart disease with heart failure: Secondary | ICD-10-CM | POA: Diagnosis not present

## 2020-07-22 DIAGNOSIS — J449 Chronic obstructive pulmonary disease, unspecified: Secondary | ICD-10-CM | POA: Diagnosis not present

## 2020-07-22 DIAGNOSIS — Z008 Encounter for other general examination: Secondary | ICD-10-CM | POA: Diagnosis not present

## 2020-07-23 DIAGNOSIS — R918 Other nonspecific abnormal finding of lung field: Secondary | ICD-10-CM | POA: Diagnosis not present

## 2020-07-29 ENCOUNTER — Inpatient Hospital Stay: Payer: Medicare HMO | Attending: Family

## 2020-07-29 ENCOUNTER — Other Ambulatory Visit: Payer: Self-pay

## 2020-07-29 VITALS — BP 120/55 | HR 71 | Temp 98.2°F | Resp 18

## 2020-07-29 DIAGNOSIS — E538 Deficiency of other specified B group vitamins: Secondary | ICD-10-CM | POA: Diagnosis present

## 2020-07-29 MED ORDER — CYANOCOBALAMIN 1000 MCG/ML IJ SOLN
INTRAMUSCULAR | Status: AC
  Filled 2020-07-29: qty 1

## 2020-07-29 MED ORDER — CYANOCOBALAMIN 1000 MCG/ML IJ SOLN
1000.0000 ug | Freq: Once | INTRAMUSCULAR | Status: AC
  Administered 2020-07-29: 1000 ug via INTRAMUSCULAR

## 2020-07-29 NOTE — Patient Instructions (Signed)

## 2020-07-30 DIAGNOSIS — M62838 Other muscle spasm: Secondary | ICD-10-CM | POA: Diagnosis not present

## 2020-07-30 DIAGNOSIS — E538 Deficiency of other specified B group vitamins: Secondary | ICD-10-CM | POA: Diagnosis not present

## 2020-07-30 DIAGNOSIS — M9901 Segmental and somatic dysfunction of cervical region: Secondary | ICD-10-CM | POA: Diagnosis not present

## 2020-07-30 DIAGNOSIS — M9903 Segmental and somatic dysfunction of lumbar region: Secondary | ICD-10-CM | POA: Diagnosis not present

## 2020-07-30 DIAGNOSIS — M9902 Segmental and somatic dysfunction of thoracic region: Secondary | ICD-10-CM | POA: Diagnosis not present

## 2020-07-30 DIAGNOSIS — H539 Unspecified visual disturbance: Secondary | ICD-10-CM | POA: Diagnosis not present

## 2020-07-30 DIAGNOSIS — M5033 Other cervical disc degeneration, cervicothoracic region: Secondary | ICD-10-CM | POA: Diagnosis not present

## 2020-07-30 DIAGNOSIS — R9430 Abnormal result of cardiovascular function study, unspecified: Secondary | ICD-10-CM | POA: Diagnosis not present

## 2020-07-31 ENCOUNTER — Ambulatory Visit: Payer: Medicare HMO | Admitting: Neurology

## 2020-07-31 ENCOUNTER — Telehealth: Payer: Self-pay | Admitting: Neurology

## 2020-07-31 ENCOUNTER — Encounter: Payer: Self-pay | Admitting: Neurology

## 2020-07-31 VITALS — BP 132/67 | HR 68 | Ht 71.0 in | Wt 194.5 lb

## 2020-07-31 DIAGNOSIS — I700 Atherosclerosis of aorta: Secondary | ICD-10-CM

## 2020-07-31 DIAGNOSIS — G9511 Acute infarction of spinal cord (embolic) (nonembolic): Secondary | ICD-10-CM

## 2020-07-31 DIAGNOSIS — H539 Unspecified visual disturbance: Secondary | ICD-10-CM

## 2020-07-31 DIAGNOSIS — Z86711 Personal history of pulmonary embolism: Secondary | ICD-10-CM

## 2020-07-31 DIAGNOSIS — R42 Dizziness and giddiness: Secondary | ICD-10-CM

## 2020-07-31 DIAGNOSIS — G08 Intracranial and intraspinal phlebitis and thrombophlebitis: Secondary | ICD-10-CM | POA: Diagnosis not present

## 2020-07-31 DIAGNOSIS — I6523 Occlusion and stenosis of bilateral carotid arteries: Secondary | ICD-10-CM | POA: Insufficient documentation

## 2020-07-31 DIAGNOSIS — G459 Transient cerebral ischemic attack, unspecified: Secondary | ICD-10-CM

## 2020-07-31 NOTE — Progress Notes (Signed)
[facility]  PATIENT: [name] DOB: February 06, 1941  REFERRING DOCTOR OR PCP:  [name], PA-C ([place]) SOURCE: Patient, notes from PCP, imaging and lab reports, MRI images personally reviewed.  _________________________________   HISTORICAL  CHIEF COMPLAINT:  Chief Complaint  Patient presents with  . New Patient (Initial Visit)    RM 41 with wife. Paper referral from Lynne Leader, MD for leg numbness, bilaterally. Sx started about 2 years ago after "LP block" per wife. Saw Dr. Felecia Shelling in the past after this and thought he had a spinal stroke? Has been following sports medicine, Dr. Lynne Leader but wanted to be evaluated again by Dr. Felecia Shelling for ongoing sx.     HISTORY OF PRESENT ILLNESS:  [name] is a 79 y.o. man with multiple neurologic issues  Update 07/31/2020: He has continued leg numbness and dysesthesias since 09/04/2018 those symptoms improve quite a bit from the onset.  Gait is doing well.  No weakness.[name]    He denies weakness or ataxia.  He has no bladder issues.     He had an episode 3 weeks ago that occurred after turning over in bed.    He thought he just had a positional vertigo but when he got out of bed he had blurry vision for a minute. The blurriness was bilateral, started while reading but also when he looked up.   It quickly resolved to baseline.   He had a few episodes also lasting one minute or less in one eye or the other over the next couple days.    He has had a few other similar spells over the years but never as many at one tome or as severe.    Update 01/24/2019: Numbness and burning has improved but not resolved.   He continues to note numbness in his legs, most noted below the knees, mostly in the toes.    There is mild burning sensation in the skin     He notes symptoms more after he has rested (not necessarily sleep).    It is irritating at those times but not bad enough to add a medication.    Moving helps the  pain/numbness.  He had had the numbness event 09/04/2018 and he had several days of profound numbness in legs and bladder issues.    Legs were very clumsy.    A thoracic spine MRI (that I have personally reviewed) did not show any spinal cord process.     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  He is on Eliquis.    Update 10/19/2018: About 6 weeks ago 09/04/2018 he was seen in the ED.    He had had facet blocks a couple days earlier when he lost all sensation in both legs that came on over seconds to a minute.   The numbness improved after 30 minutes and then came back 30 minutes later and was worse with numbness/tight dysesthesia.   They went to the ED.   He walked in to the ED.    He was unable to give a urine sample and needed to be catheterized.  He went home with a catheter after spending a long time in the ED.    He saw Dr. Arrie Eastern who admitted him to [facility] and he had MRI thoracic not showing any spinal process.    By 2 days alter he was much better and his bladder control improved  Since the last visit, he had an MR  Venogram confirming reduced flow in the righit sigmoid sinus/jugular vein.  He is already on Eliquis so no treatment is needed at this time but I would recommend Plavix if the Eliquis is ever stopped.  Multiple MRI images performed at [facility] from September were reviewed in his presence.   Given his history, I was most concerned about the thoracic spine.  Study was done with and without contrast and it is entirely normal except for a focus in the right T6 transverse process consistent with a hemangioma and unlikely to cause any symptoms.  The MRI of the lumbar spine shows multilevel degenerative changes including moderate spinal stenosis at L3-L4 and multilevel foraminal narrowing.  However, these changes are stable compared to his previous lumbar spine and would be unlikely to cause his symptoms.  The MRI of the brain did  not show any acute findings.  I also reviewed a CT scan of the abdomen and pelvis performed 03/31/2013.  At that time, he had apical sclerosis in the distal aorta.  He had a 45 minute band in his vision, possibly due to a TIA , before his first visit with me.    MRA of the neck and head did not show any significant issue (mild left ICA stenosis only)     From 07/18/2018: He is a 79 yo man who had several episodes of half moon shaped visual effects on the left in 2013, sometimes with squiggly lines.    He saw ophthalmology an no problem was identified.   In 2016, he had a few more similar episodes.  They would last 30-60 seconds, sometimes less.   He did well until this year when he had several episodes within 2 months.    The most recent episode was 06/29/2018 which was different.   He had an upper right visual defect with a gray band lasting 90 seconds.   This was the first one on the right and also involved graying of vision instead of squiggly lines.   He mentioned this to his PCP and an MRI of the brain was ordered.   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    It lifted and he felt back to normal.    He had a carotid Doppler study 5/16 80/2013 and it showed mild calcification at the bifurcation with mild stenosis estimated to be less than 50%.  He is on Eliquis for Pulmonary embolism (small) an episode of paroxysmal atrial fibrillation and cardiomyopathy (has EF% reportedly 35%).    Of note, the only time he has been found to have A. fib was when he was hospitalized with severe pneumonia.   He sees Ulyses Southward [facility])  He has had problems with his haring on the left x 30+ years and more recently on his left.    He has had hearing aids x 6 years, though they do not help the left as much as the  right.    I personally reviewed the MRI of the brain dated 07/13/2018.  There is mild age-appropriate cortical atrophy and very minimal chronic microvascular ischemic change.  There is abnormal signal in the sigmoid sinus on the right that could represent thrombosis.  This study was done without contrast.  REVIEW OF SYSTEMS:  Constitutional: No fevers, chills, sweats, or change in appetite Eyes: No visual changes, double vision, eye pain Ear, nose and throat: He has left greater than the right hearing loss.  Sometimes he has dizziness. Cardiovascular: No chest pain, palpitations.   See above Respiratory: No shortness of breath at rest or with exertion.   No wheezes.  He has snoring. GastrointestinaI: No nausea, vomiting, diarrhea, abdominal pain, fecal incontinence Genitourinary: No dysuria, urinary retention or frequency.  No nocturia. Musculoskeletal: No neck pain, back pain Integumentary: No rash, pruritus, skin lesions Neurological: as above Psychiatric: No depression at this time.  No anxiety Endocrine: No palpitations, diaphoresis, change in appetite, change in weigh or increased thirst Hematologic/Lymphatic:He is on anticoagulation.  He bruises easily. Allergic/Immunologic: No itchy/runny eyes, nasal congestion, recent allergic reactions, rashes  ALLERGIES: Allergies  Allergen Reactions  . Tramadol Other (See Comments)    Dizziness  . Flagyl [Metronidazole] Nausea And Vomiting    States IV form causes to allergy  . Flomax [Tamsulosin Hcl] Other (See Comments)    Pre-syncope  . Lipitor [Atorvastatin] Other (See Comments)    Myalgia    HOME MEDICATIONS:  Current Outpatient Medications:  .  apixaban (ELIQUIS) 5 MG TABS tablet, Take 5 mg by mouth 2 (two) times daily. , Disp: , Rfl:  .  levothyroxine (SYNTHROID) 75 MCG tablet, Take daily in AM on empty stomach., Disp: 90 tablet, Rfl: 2 .  lisinopril (PRINIVIL,ZESTRIL) 10 MG tablet, Take by mouth., Disp: , Rfl:  .  meclizine  (ANTIVERT) 25 MG tablet, Take by mouth. , Disp: , Rfl:  .  meloxicam (MOBIC) 15 MG tablet, Take 1 tablet by mouth once daily, Disp: 90 tablet, Rfl: 0 .  metoprolol tartrate (LOPRESSOR) 50 MG tablet, Take 50 mg by mouth 2 (two) times daily., Disp: , Rfl:  .  omeprazole (PRILOSEC) 10 MG capsule, daily. , Disp: , Rfl:  .  simvastatin (ZOCOR) 40 MG tablet, Take 1 tablet (40 mg total) by mouth daily., Disp: 90 tablet, Rfl: 2  PAST MEDICAL HISTORY: Past Medical History:  Diagnosis Date  . Abdominal wall defect, acquired   . Blockage of coronary artery of heart (HCC)    30%  . Hearing aid worn   . Insulin resistance   . LV dysfunction    reduction  . Osteoarthritis   . Pulmonary embolism ([place])     PAST SURGICAL HISTORY: Past Surgical History:  Procedure Laterality Date  . ACNE CYST REMOVAL  2012   Back   . APPENDECTOMY    . CARDIAC CATHETERIZATION    . COLECTOMY  11/2013   [facility], 2nd to diverticulitis  . RT hip replacement    . TONSILLECTOMY      FAMILY HISTORY: Family History  Problem Relation Age of Onset  . Heart disease Mother        pacemaker    SOCIAL HISTORY:  Social History   Socioeconomic History  . Marital status: Married    Spouse name: Not on file  . Number of children: Not on file  . Years of education: Not on file  . Highest education level: Not on file  Occupational History  . Occupation: Doctor, hospital    Comment: wedding cakes, self imployed  Tobacco Use  . Smoking status: Former Smoker    Packs/day: 1.00    Years: 15.00    Pack years: 15.00    Quit date: 12/21/1985    Years since quitting: 34.6  . Smokeless tobacco: Never Used  .  Tobacco comment: pack a day   Vaping Use  . Vaping Use: Never used  Substance and Sexual Activity  . Alcohol use: No  . Drug use: No  . Sexual activity: Not on file    Comment: self employed, makes cakes, married, 4 adult children,   Other Topics Concern  . Not on file  Social History Narrative   Born in  [place].       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 Difficulty of Paying Living Expenses:   Food Insecurity:   . Worried About Charity fundraiser in the Last Year:   . Arboriculturist in the Last Year:   Transportation Needs:   . Film/video editor (Medical):   [name] Lack of Transportation (Non-Medical):   Physical Activity:   . Days of Exercise per Week:   . Minutes of Exercise per Session:   Stress:   . Feeling of Stress :   Social Connections:   . Frequency of Communication with Friends and Family:   . Frequency of Social Gatherings with Friends and Family:   . Attends Religious Services:   . Active Member of Clubs or Organizations:   . Attends Archivist Meetings:   [name] Marital Status:   Intimate Partner Violence:   . Fear of Current or Ex-Partner:   . Emotionally Abused:   [name] Physically Abused:   . Sexually Abused:      PHYSICAL EXAM  Vitals:   07/31/20 1008  BP: 132/67  Pulse: 68  Weight: 194 lb 8 oz (88.2 kg)  Height: 5\' 11"  (1.803 m)    Body mass index is 27.13 kg/m.   General: The patient is well-developed and well-nourished and in no acute distress   Cardiovascular: No carotid bruits.  The heart has a regular rate and rhythm with a normal S1 and S2. There were no murmurs, gallops or rubs.     Neurologic Exam  Mental status: The patient is alert and oriented x 3 at the time of the examination. The patient has apparent normal recent and remote memory, with an apparently normal attention span and concentration ability.   Speech is normal.  Cranial nerves: Extraocular movements are full.  Facial strength is normal.  Trapezius and sternocleidomastoid strength is normal. No dysarthria is noted.   . No obvious hearing deficits are noted.  Motor:  Muscle bulk is normal.   Tone is normal. Strength is  5 / 5 in all 4 extremities.    Sensory:   Today, he has normal sensation to touch, temperature and vibration in the arms and legs   Coordination: Cerebellar testing shows good finger-nose-finger and heel-to-shin  Gait and station: Station is normal.   Gait is normal. Tandem gait is slightly wide though this is normal for age. Romberg is negative.   Reflexes: Deep tendon reflexes are symmetric and normal bilaterally.   Plantar responses are flexor.    DIAGNOSTIC DATA (LABS, IMAGING, TESTING) - I reviewed patient records, labs, notes, testing and imaging myself where available.  Lab Results  Component Value Date   WBC 9.5 06/27/2020   HGB 14.1 06/27/2020   HCT 42.6 06/27/2020   MCV 91.4 06/27/2020   PLT 216 06/27/2020      Component Value Date/Time   NA 139 06/27/2020 1053  K 5.0 06/27/2020 1053   CL 106 06/27/2020 1053   CO2 27 06/27/2020 1053   GLUCOSE 128 (H) 06/27/2020 1053   BUN 23 06/27/2020 1053   CREATININE 1.29 (H) 06/27/2020 1053   CREATININE 1.02 03/09/2018 0838   CALCIUM 9.8 06/27/2020 1053   PROT 7.0 06/27/2020 1053   ALBUMIN 4.7 06/27/2020 1053   AST 18 06/27/2020 1053   ALT 16 06/27/2020 1053   ALKPHOS 39 06/27/2020 1053   BILITOT 0.8 06/27/2020 1053   GFRNONAA 53 (L) 06/27/2020 1053   GFRNONAA 89 01/29/2016 0805   GFRAA >60 06/27/2020 1053   GFRAA >89 01/29/2016 0805   Lab Results  Component Value Date   CHOL 147 04/03/2020   HDL 40.30 04/03/2020   LDLCALC 87 04/03/2020   LDLDIRECT 168 (H) 11/23/2006   TRIG 95.0 04/03/2020   CHOLHDL 4 04/03/2020   Lab Results  Component Value Date   HGBA1C 6.0 04/03/2020   Lab Results  Component Value Date   NIOEVOJJ00 938 06/27/2020   Lab Results  Component Value Date   TSH 0.77 04/03/2020       ASSESSMENT AND PLAN  TIA (transient ischemic attack) - Plan: MR BRAIN W WO CONTRAST, MR ANGIO HEAD WO CONTRAST, MR ANGIO NECK W WO CONTRAST  Spinal cord infarction [facility])  Thoracic aortic atherosclerosis ([place])  History of  pulmonary embolism  Visual disturbance - Plan: MR BRAIN W WO CONTRAST  Vertigo - Plan: MR BRAIN W WO CONTRAST  Bilateral carotid artery stenosis - Plan: MR ANGIO HEAD WO CONTRAST, MR ANGIO NECK W WO CONTRAST  Cerebral venous sinus thrombosis  1.    He has had couple of episodes of transient visual loss.  He has known mild carotid artery disease seen on previous studies.  Due to the new symptoms we need to check an MRI of the brain and MR angiogram of the head and neck to determine if he has a critical stenosis that needs to be treated surgically.  He is on Eliquis so I would not add an additional antiplatelet medication at this time.   2.   He continues to have some numbness in the legs.   I discussed with him that since symptoms began about 2 years ago and he continues to have some residual numbness with dysesthesias, it is probable that the sensory symptoms will persist.  I feel a small spinal cord infarction (lower T-spine) best explains his symptons but MRI T-spine was normal, so the etiology is still not certain. 3.   He will return in 6 months or sooner if there are new or worsening neurologic symptoms.  He is advised to call if any new or worsening neurologic symptoms and will return to see me in a few months for reevaluation.  45-minute face-to-face evaluation with greater than one half the time counseling and coordinating care about his recent event and implications.  Kaitrin Seybold A. Felecia Shelling, MD, [facility] 1/82/9937, 1:69 PM Certified in Neurology, Clinical Neurophysiology, Sleep Medicine, Pain Medicine and Neuroimaging  [facility] [address], [place], [place] [address] [phone]

## 2020-07-31 NOTE — Telephone Encounter (Signed)
[employer] medicare order sent to [facility]. They will obtain the auth and reach out to the patient to schedule.  °

## 2020-08-01 DIAGNOSIS — M545 Low back pain: Secondary | ICD-10-CM | POA: Diagnosis not present

## 2020-08-01 DIAGNOSIS — I2510 Atherosclerotic heart disease of native coronary artery without angina pectoris: Secondary | ICD-10-CM | POA: Diagnosis not present

## 2020-08-01 DIAGNOSIS — R0600 Dyspnea, unspecified: Secondary | ICD-10-CM | POA: Diagnosis not present

## 2020-08-01 DIAGNOSIS — R942 Abnormal results of pulmonary function studies: Secondary | ICD-10-CM | POA: Diagnosis not present

## 2020-08-05 NOTE — Telephone Encounter (Signed)
Holli Humbles #[id number] - [id number] & [id number] exp 01/28/2021/

## 2020-08-07 DIAGNOSIS — I480 Paroxysmal atrial fibrillation: Secondary | ICD-10-CM | POA: Diagnosis not present

## 2020-08-07 DIAGNOSIS — I428 Other cardiomyopathies: Secondary | ICD-10-CM | POA: Diagnosis not present

## 2020-08-07 DIAGNOSIS — I2510 Atherosclerotic heart disease of native coronary artery without angina pectoris: Secondary | ICD-10-CM | POA: Diagnosis not present

## 2020-08-07 DIAGNOSIS — Z7901 Long term (current) use of anticoagulants: Secondary | ICD-10-CM | POA: Diagnosis not present

## 2020-08-07 NOTE — Telephone Encounter (Signed)
Patient wife left me a voicemail on my phone stating they havent heard anything from the imaging place about his MRI's. I called the patient wife back and informed her that [facility] has tried to reach out to them twice once on 08/02/20 and once on 08/05/20. She states that she is going to give them a call to schedule. And I gave her their number of [phone].,

## 2020-08-09 ENCOUNTER — Ambulatory Visit
Admission: RE | Admit: 2020-08-09 | Discharge: 2020-08-09 | Disposition: A | Discharge status: Home / Self Care | Payer: Medicare HMO | Source: Ambulatory Visit | Attending: Neurology | Admitting: Neurology

## 2020-08-09 ENCOUNTER — Other Ambulatory Visit: Payer: Self-pay

## 2020-08-09 DIAGNOSIS — R42 Dizziness and giddiness: Secondary | ICD-10-CM

## 2020-08-09 DIAGNOSIS — G459 Transient cerebral ischemic attack, unspecified: Secondary | ICD-10-CM

## 2020-08-09 DIAGNOSIS — I6523 Occlusion and stenosis of bilateral carotid arteries: Secondary | ICD-10-CM

## 2020-08-09 DIAGNOSIS — H539 Unspecified visual disturbance: Secondary | ICD-10-CM | POA: Diagnosis not present

## 2020-08-09 MED ORDER — GADOBENATE DIMEGLUMINE 529 MG/ML IV SOLN
19.0000 mL | Freq: Once | INTRAVENOUS | Status: AC | PRN
  Administered 2020-08-09: 19 mL via INTRAVENOUS

## 2020-08-12 ENCOUNTER — Telehealth: Payer: Self-pay | Admitting: *Deleted

## 2020-08-12 NOTE — Telephone Encounter (Signed)
-----   Message from Britt Bottom, MD sent at 08/11/2020  6:41 PM EDT ----- Please let him know that the MRI of the brain shows mild age-related changes but there are no strokes or other new findings.  The MR angiograms show mild narrowing of the left carotid artery but not enough to affect blood flow.  It looks unchanged compared to the previous one he had a couple years ago.

## 2020-08-20 DIAGNOSIS — M5033 Other cervical disc degeneration, cervicothoracic region: Secondary | ICD-10-CM | POA: Diagnosis not present

## 2020-08-20 DIAGNOSIS — M9903 Segmental and somatic dysfunction of lumbar region: Secondary | ICD-10-CM | POA: Diagnosis not present

## 2020-08-20 DIAGNOSIS — M9901 Segmental and somatic dysfunction of cervical region: Secondary | ICD-10-CM | POA: Diagnosis not present

## 2020-08-20 DIAGNOSIS — M9902 Segmental and somatic dysfunction of thoracic region: Secondary | ICD-10-CM | POA: Diagnosis not present

## 2020-08-28 ENCOUNTER — Inpatient Hospital Stay: Payer: Medicare HMO

## 2020-08-28 ENCOUNTER — Other Ambulatory Visit: Payer: Self-pay | Admitting: Family

## 2020-08-28 ENCOUNTER — Encounter: Payer: Self-pay | Admitting: Family

## 2020-08-28 ENCOUNTER — Other Ambulatory Visit: Payer: Self-pay

## 2020-08-28 ENCOUNTER — Inpatient Hospital Stay (HOSPITAL_BASED_OUTPATIENT_CLINIC_OR_DEPARTMENT_OTHER): Payer: Medicare HMO | Admitting: Family

## 2020-08-28 ENCOUNTER — Inpatient Hospital Stay: Payer: Medicare HMO | Attending: Family

## 2020-08-28 ENCOUNTER — Other Ambulatory Visit: Payer: Self-pay | Admitting: *Deleted

## 2020-08-28 ENCOUNTER — Telehealth: Payer: Self-pay | Admitting: Hematology & Oncology

## 2020-08-28 VITALS — BP 138/65 | HR 66 | Temp 98.3°F | Resp 18 | Ht 71.0 in | Wt 199.0 lb

## 2020-08-28 DIAGNOSIS — E538 Deficiency of other specified B group vitamins: Secondary | ICD-10-CM

## 2020-08-28 DIAGNOSIS — J449 Chronic obstructive pulmonary disease, unspecified: Secondary | ICD-10-CM | POA: Insufficient documentation

## 2020-08-28 LAB — CMP (CANCER CENTER ONLY)
ALT: 13 U/L (ref 0–44)
AST: 20 U/L (ref 15–41)
Albumin: 4.1 g/dL (ref 3.5–5.0)
Alkaline Phosphatase: 43 U/L (ref 38–126)
Anion gap: 7 (ref 5–15)
BUN: 19 mg/dL (ref 8–23)
CO2: 27 mmol/L (ref 22–32)
Calcium: 9.2 mg/dL (ref 8.9–10.3)
Chloride: 108 mmol/L (ref 98–111)
Creatinine: 0.99 mg/dL (ref 0.61–1.24)
GFR, Est AFR Am: >60 mL/min (ref >60)
GFR, Estimated: >60 mL/min (ref >60)
Glucose, Bld: 151 mg/dL — ABNORMAL HIGH (ref 70–99)
Potassium: 4.2 mmol/L (ref 3.5–5.1)
Sodium: 142 mmol/L (ref 135–145)
Total Bilirubin: 0.7 mg/dL (ref 0.3–1.2)
Total Protein: 6.2 g/dL — ABNORMAL LOW (ref 6.5–8.1)

## 2020-08-28 LAB — CBC WITH DIFFERENTIAL (CANCER CENTER ONLY)
Abs Immature Granulocytes: 0.04 10*3/uL (ref 0.00–0.07)
Basophils Absolute: 0.1 10*3/uL (ref 0.0–0.1)
Basophils Relative: 1 %
Eosinophils Absolute: 0.1 10*3/uL (ref 0.0–0.5)
Eosinophils Relative: 2 %
HCT: 35.6 % — ABNORMAL LOW (ref 39.0–52.0)
Hemoglobin: 11.8 g/dL — ABNORMAL LOW (ref 13.0–17.0)
Immature Granulocytes: 1 %
Lymphocytes Relative: 17 %
Lymphs Abs: 1.3 10*3/uL (ref 0.7–4.0)
MCH: 30.2 pg (ref 26.0–34.0)
MCHC: 33.1 g/dL (ref 30.0–36.0)
MCV: 91 fL (ref 80.0–100.0)
Monocytes Absolute: 0.6 10*3/uL (ref 0.1–1.0)
Monocytes Relative: 8 %
Neutro Abs: 5.4 10*3/uL (ref 1.7–7.7)
Neutrophils Relative %: 71 %
Platelet Count: 176 10*3/uL (ref 150–400)
RBC: 3.91 MIL/uL — ABNORMAL LOW (ref 4.22–5.81)
RDW: 13.4 % (ref 11.5–15.5)
WBC Count: 7.5 10*3/uL (ref 4.0–10.5)
nRBC: 0 % (ref 0.0–0.2)

## 2020-08-28 LAB — VITAMIN B12: Vitamin B-12: 397 pg/mL (ref 180–914)

## 2020-08-28 MED ORDER — CYANOCOBALAMIN 1000 MCG/ML IJ SOLN
INTRAMUSCULAR | Status: AC
  Filled 2020-08-28: qty 1

## 2020-08-28 MED ORDER — CYANOCOBALAMIN 1000 MCG/ML IJ SOLN
1000.0000 ug | Freq: Once | INTRAMUSCULAR | Status: AC
  Administered 2020-08-28: 1000 ug via INTRAMUSCULAR

## 2020-08-28 NOTE — Telephone Encounter (Signed)
Appointments scheduled calendar printed & mailed per 9/8 los

## 2020-08-28 NOTE — Progress Notes (Unsigned)
Parietal

## 2020-08-28 NOTE — Progress Notes (Signed)
Hematology and Oncology Follow Up Visit  [name] [id number] 28-May-1941 79 y.o. 08/28/2020   Principle Diagnosis:  B 12 deficiency   Current Therapy:   Monthly B 12 injcetion   Interim History:  [name] is here today for follow-up. He is doing well but does note fatigue.  B 12 level last visit was up to 691. Today's level is pending.  He was able to see a pulmonologist and after having extensive testing was diagnosed with mild COPD. He has some SOB with over exertion at times.  He has not noted any blood loss. No bruising or petechiae.  No fever, chills, n/v, cough, rash, dizziness, chest pain, palpitations, abdominal pain or changes in bowel or bladder habits.  No swelling, tenderness, numbness or tingling in his extremities.  No falls or syncope.  He is eating well and staying hydrated. His weight is stable.   ECOG Performance Status: 1 - Symptomatic but completely ambulatory  Medications:  Allergies as of 08/28/2020      Reactions   Flomax [tamsulosin Hcl] Other (See Comments)   Pre-syncope   Flagyl [metronidazole] Nausea And Vomiting   States IV form causes to allergy   Lipitor [atorvastatin] Other (See Comments)   Myalgia   Tramadol Other (See Comments)   Dizziness      Medication List       Accurate as of August 28, 2020 11:30 AM. If you have any questions, ask your nurse or doctor.        apixaban 5 MG Tabs tablet Commonly known as: ELIQUIS Take 5 mg by mouth 2 (two) times daily.   levothyroxine 75 MCG tablet Commonly known as: SYNTHROID Take daily in AM on empty stomach.   lisinopril 10 MG tablet Commonly known as: ZESTRIL Take by mouth.   meclizine 25 MG tablet Commonly known as: ANTIVERT Take by mouth.   meloxicam 15 MG tablet Commonly known as: MOBIC Take 1 tablet by mouth once daily   metoprolol tartrate 50 MG tablet Commonly known as: LOPRESSOR Take 50 mg by mouth 2 (two) times daily.   omeprazole 10 MG capsule Commonly  known as: PRILOSEC daily.   simvastatin 40 MG tablet Commonly known as: ZOCOR Take 1 tablet (40 mg total) by mouth daily.       Allergies:  Allergies  Allergen Reactions  . Flomax [Tamsulosin Hcl] Other (See Comments)    Pre-syncope  . Flagyl [Metronidazole] Nausea And Vomiting    States IV form causes to allergy  . Lipitor [Atorvastatin] Other (See Comments)    Myalgia  . Tramadol Other (See Comments)    Dizziness    Past Medical History, Surgical history, Social history, and Family History were reviewed and updated.  Review of Systems: All other 10 point review of systems is negative.   Physical Exam:  vitals were not taken for this visit.   Wt Readings from Last 3 Encounters:  07/31/20 194 lb 8 oz (88.2 kg)  06/27/20 193 lb 1.3 oz (87.6 kg)  05/29/20 199 lb 9.6 oz (90.5 kg)    Ocular: Sclerae unicteric, pupils equal, round and reactive to light Ear-nose-throat: Oropharynx clear, dentition fair Lymphatic: No cervical or supraclavicular adenopathy Lungs no rales or rhonchi, good excursion bilaterally Heart regular rate and rhythm, no murmur appreciated Abd soft, nontender, positive bowel sounds, no liver or spleen tip palpated on exam, no fluid wave  MSK no focal spinal tenderness, no joint edema Neuro: non-focal, well-oriented, appropriate affect Breasts: Deferred   Lab  Results  Component Value Date   WBC 7.5 08/28/2020   HGB 11.8 (L) 08/28/2020   HCT 35.6 (L) 08/28/2020   MCV 91.0 08/28/2020   PLT 176 08/28/2020   No results found for: FERRITIN, IRON, TIBC, UIBC, IRONPCTSAT Lab Results  Component Value Date   RBC 3.91 (L) 08/28/2020   No results found for: KPAFRELGTCHN, LAMBDASER, KAPLAMBRATIO No results found for: IGGSERUM, IGA, IGMSERUM No results found for: Odetta Pink, SPEI   Chemistry      Component Value Date/Time   NA 139 06/27/2020 1053   K 5.0 06/27/2020 1053   CL 106 06/27/2020 1053    CO2 27 06/27/2020 1053   BUN 23 06/27/2020 1053   CREATININE 1.29 (H) 06/27/2020 1053   CREATININE 1.02 03/09/2018 0838      Component Value Date/Time   CALCIUM 9.8 06/27/2020 1053   ALKPHOS 39 06/27/2020 1053   AST 18 06/27/2020 1053   ALT 16 06/27/2020 1053   BILITOT 0.8 06/27/2020 1053       Impression and Plan: [name] is a very pleasant 79 yo [place] gentleman with history of B12 deficiency (diagnosed in 2019).  He received his B 12 injection today as planned. He will continue his monthly injection schedule for now.  We will see him again in 3 months for follow-up.  He can contact our office with nay questions or concerns. We can certainly see him sooner if needed.   Laverna Peace, NP 9/8/202111:30 AM

## 2020-08-29 LAB — INTRINSIC FACTOR ANTIBODIES: Intrinsic Factor: 1 AU/mL (ref 0.0–1.1)

## 2020-08-31 LAB — ANTI-PARIETAL ANTIBODY: Parietal Cell Antibody-IgG: 0.5 Units (ref 0.0–20.0)

## 2020-09-13 ENCOUNTER — Other Ambulatory Visit: Payer: Self-pay | Admitting: Physician Assistant

## 2020-09-27 ENCOUNTER — Inpatient Hospital Stay: Payer: Medicare HMO | Attending: Family

## 2020-09-27 ENCOUNTER — Other Ambulatory Visit: Payer: Self-pay

## 2020-09-27 VITALS — BP 137/59 | HR 69 | Temp 98.4°F | Resp 18

## 2020-09-27 DIAGNOSIS — E538 Deficiency of other specified B group vitamins: Secondary | ICD-10-CM | POA: Insufficient documentation

## 2020-09-27 MED ORDER — CYANOCOBALAMIN 1000 MCG/ML IJ SOLN
INTRAMUSCULAR | Status: AC
  Filled 2020-09-27: qty 1

## 2020-09-27 MED ORDER — CYANOCOBALAMIN 1000 MCG/ML IJ SOLN
1000.0000 ug | Freq: Once | INTRAMUSCULAR | Status: AC
  Administered 2020-09-27: 1000 ug via INTRAMUSCULAR

## 2020-09-27 NOTE — Patient Instructions (Signed)

## 2020-10-28 ENCOUNTER — Inpatient Hospital Stay: Payer: Medicare HMO | Attending: Family

## 2020-10-28 ENCOUNTER — Other Ambulatory Visit: Payer: Self-pay

## 2020-10-28 VITALS — BP 127/54 | HR 67 | Temp 98.3°F

## 2020-10-28 DIAGNOSIS — E538 Deficiency of other specified B group vitamins: Secondary | ICD-10-CM | POA: Diagnosis present

## 2020-10-28 MED ORDER — CYANOCOBALAMIN 1000 MCG/ML IJ SOLN
INTRAMUSCULAR | Status: AC
  Filled 2020-10-28: qty 1

## 2020-10-28 MED ORDER — CYANOCOBALAMIN 1000 MCG/ML IJ SOLN
1000.0000 ug | Freq: Once | INTRAMUSCULAR | Status: AC
  Administered 2020-10-28: 1000 ug via INTRAMUSCULAR

## 2020-10-28 NOTE — Patient Instructions (Signed)
Pt discharged in no apparent distress. Pt left ambulatory with. Pt aware of discharge instructions and verbalized understanding and had no further questions. Pt aware of discharge instructions and verbalized understanding and had no further questions.Cyanocobalamin, Vitamin B12 injection What is this medicine? CYANOCOBALAMIN (sye an oh koe BAL a min) is a man made form of vitamin B12. Vitamin B12 is used in the growth of healthy blood cells, nerve cells, and proteins in the body. It also helps with the metabolism of fats and carbohydrates. This medicine is used to treat people who can not absorb vitamin B12. This medicine may be used for other purposes; ask your health care provider or pharmacist if you have questions. COMMON BRAND NAME(S): B-12 Compliance Kit, B-12 Injection Kit, Cyomin, LA-12, Nutri-Twelve, Physicians EZ Use B-12, Primabalt What should I tell my health care provider before I take this medicine? They need to know if you have any of these conditions:  kidney disease  Leber's disease  megaloblastic anemia  an unusual or allergic reaction to cyanocobalamin, cobalt, other medicines, foods, dyes, or preservatives  pregnant or trying to get pregnant  breast-feeding How should I use this medicine? This medicine is injected into a muscle or deeply under the skin. It is usually given by a health care professional in a clinic or doctor's office. However, your doctor may teach you how to inject yourself. Follow all instructions. Talk to your pediatrician regarding the use of this medicine in children. Special care may be needed. Overdosage: If you think you have taken too much of this medicine contact a poison control center or emergency room at once. NOTE: This medicine is only for you. Do not share this medicine with others. What if I miss a dose? If you are given your dose at a clinic or doctor's office, call to reschedule your appointment. If you give your own injections and you  miss a dose, take it as soon as you can. If it is almost time for your next dose, take only that dose. Do not take double or extra doses. What may interact with this medicine?  colchicine  heavy alcohol intake This list may not describe all possible interactions. Give your health care provider a list of all the medicines, herbs, non-prescription drugs, or dietary supplements you use. Also tell them if you smoke, drink alcohol, or use illegal drugs. Some items may interact with your medicine. What should I watch for while using this medicine? Visit your doctor or health care professional regularly. You may need blood work done while you are taking this medicine. You may need to follow a special diet. Talk to your doctor. Limit your alcohol intake and avoid smoking to get the best benefit. What side effects may I notice from receiving this medicine? Side effects that you should report to your doctor or health care professional as soon as possible:  allergic reactions like skin rash, itching or hives, swelling of the face, lips, or tongue  blue tint to skin  chest tightness, pain  difficulty breathing, wheezing  dizziness  red, swollen painful area on the leg Side effects that usually do not require medical attention (report to your doctor or health care professional if they continue or are bothersome):  diarrhea  headache This list may not describe all possible side effects. Call your doctor for medical advice about side effects. You may report side effects to FDA at 1-800-FDA-1088. Where should I keep my medicine? Keep out of the reach of children. Store at  room temperature between 15 and 30 degrees C (59 and 85 degrees F). Protect from light. Throw away any unused medicine after the expiration date. NOTE: This sheet is a summary. It may not cover all possible information. If you have questions about this medicine, talk to your doctor, pharmacist, or health care provider.  2020  Elsevier/Gold Standard (2008-03-19 22:10:20)

## 2020-11-04 ENCOUNTER — Other Ambulatory Visit: Payer: Self-pay | Admitting: Physician Assistant

## 2020-11-27 ENCOUNTER — Inpatient Hospital Stay (HOSPITAL_BASED_OUTPATIENT_CLINIC_OR_DEPARTMENT_OTHER): Payer: Medicare HMO | Admitting: Hematology & Oncology

## 2020-11-27 ENCOUNTER — Inpatient Hospital Stay: Payer: Medicare HMO | Attending: Family

## 2020-11-27 ENCOUNTER — Other Ambulatory Visit: Payer: Self-pay

## 2020-11-27 ENCOUNTER — Encounter: Payer: Self-pay | Admitting: Hematology & Oncology

## 2020-11-27 ENCOUNTER — Inpatient Hospital Stay: Payer: Medicare HMO

## 2020-11-27 VITALS — BP 169/69 | HR 63 | Temp 97.9°F | Resp 16 | Wt 199.0 lb

## 2020-11-27 DIAGNOSIS — E538 Deficiency of other specified B group vitamins: Secondary | ICD-10-CM

## 2020-11-27 LAB — CMP (CANCER CENTER ONLY)
ALT: 12 U/L (ref 0–44)
AST: 19 U/L (ref 15–41)
Albumin: 4.3 g/dL (ref 3.5–5.0)
Alkaline Phosphatase: 41 U/L (ref 38–126)
Anion gap: 6 (ref 5–15)
BUN: 17 mg/dL (ref 8–23)
CO2: 28 mmol/L (ref 22–32)
Calcium: 9.5 mg/dL (ref 8.9–10.3)
Chloride: 104 mmol/L (ref 98–111)
Creatinine: 1.14 mg/dL (ref 0.61–1.24)
GFR, Estimated: >60 mL/min (ref >60)
Glucose, Bld: 126 mg/dL — ABNORMAL HIGH (ref 70–99)
Potassium: 4.4 mmol/L (ref 3.5–5.1)
Sodium: 138 mmol/L (ref 135–145)
Total Bilirubin: 0.5 mg/dL (ref 0.3–1.2)
Total Protein: 6.5 g/dL (ref 6.5–8.1)

## 2020-11-27 LAB — CBC WITH DIFFERENTIAL (CANCER CENTER ONLY)
Abs Immature Granulocytes: 0.04 10*3/uL (ref 0.00–0.07)
Basophils Absolute: 0 10*3/uL (ref 0.0–0.1)
Basophils Relative: 0 %
Eosinophils Absolute: 0.1 10*3/uL (ref 0.0–0.5)
Eosinophils Relative: 1 %
HCT: 37.3 % — ABNORMAL LOW (ref 39.0–52.0)
Hemoglobin: 12.1 g/dL — ABNORMAL LOW (ref 13.0–17.0)
Immature Granulocytes: 0 %
Lymphocytes Relative: 15 %
Lymphs Abs: 1.4 10*3/uL (ref 0.7–4.0)
MCH: 29.6 pg (ref 26.0–34.0)
MCHC: 32.4 g/dL (ref 30.0–36.0)
MCV: 91.2 fL (ref 80.0–100.0)
Monocytes Absolute: 0.7 10*3/uL (ref 0.1–1.0)
Monocytes Relative: 8 %
Neutro Abs: 7.2 10*3/uL (ref 1.7–7.7)
Neutrophils Relative %: 76 %
Platelet Count: 174 10*3/uL (ref 150–400)
RBC: 4.09 MIL/uL — ABNORMAL LOW (ref 4.22–5.81)
RDW: 12.7 % (ref 11.5–15.5)
WBC Count: 9.6 10*3/uL (ref 4.0–10.5)
nRBC: 0 % (ref 0.0–0.2)

## 2020-11-27 LAB — VITAMIN B12: Vitamin B-12: 504 pg/mL (ref 180–914)

## 2020-11-27 MED ORDER — CYANOCOBALAMIN 1000 MCG/ML IJ SOLN
1000.0000 ug | Freq: Once | INTRAMUSCULAR | Status: AC
  Administered 2020-11-27: 1000 ug via INTRAMUSCULAR

## 2020-11-27 MED ORDER — CYANOCOBALAMIN 1000 MCG/ML IJ SOLN
INTRAMUSCULAR | Status: AC
  Filled 2020-11-27: qty 1

## 2020-11-27 NOTE — Progress Notes (Signed)
Pt discharged in no apparent distress. Pt left ambulatory without assistance. Pt aware of discharge instructions and verbalized understanding and had no further questions.  

## 2020-11-27 NOTE — Patient Instructions (Signed)

## 2020-11-28 ENCOUNTER — Telehealth: Payer: Self-pay | Admitting: Hematology & Oncology

## 2020-11-28 NOTE — Telephone Encounter (Signed)
Appointments scheduled calendar printed & mailed per 12/8 los 

## 2020-12-26 ENCOUNTER — Other Ambulatory Visit: Payer: Self-pay | Admitting: *Deleted

## 2020-12-26 DIAGNOSIS — E538 Deficiency of other specified B group vitamins: Secondary | ICD-10-CM

## 2020-12-27 ENCOUNTER — Inpatient Hospital Stay: Payer: Medicare HMO

## 2020-12-27 ENCOUNTER — Inpatient Hospital Stay: Payer: Medicare HMO | Attending: Family

## 2020-12-27 ENCOUNTER — Other Ambulatory Visit: Payer: Self-pay

## 2020-12-27 VITALS — BP 147/69 | HR 59 | Temp 98.1°F | Resp 16

## 2020-12-27 DIAGNOSIS — E538 Deficiency of other specified B group vitamins: Secondary | ICD-10-CM

## 2020-12-27 LAB — COMPREHENSIVE METABOLIC PANEL
ALT: 14 U/L (ref 0–44)
AST: 24 U/L (ref 15–41)
Albumin: 4.3 g/dL (ref 3.5–5.0)
Alkaline Phosphatase: 41 U/L (ref 38–126)
Anion gap: 11 (ref 5–15)
BUN: 15 mg/dL (ref 8–23)
CO2: 26 mmol/L (ref 22–32)
Calcium: 9.1 mg/dL (ref 8.9–10.3)
Chloride: 103 mmol/L (ref 98–111)
Creatinine, Ser: 1.02 mg/dL (ref 0.61–1.24)
GFR, Estimated: >60 mL/min (ref >60)
Glucose, Bld: 104 mg/dL — ABNORMAL HIGH (ref 70–99)
Potassium: 4.9 mmol/L (ref 3.5–5.1)
Sodium: 140 mmol/L (ref 135–145)
Total Bilirubin: 0.7 mg/dL (ref 0.3–1.2)
Total Protein: 6.4 g/dL — ABNORMAL LOW (ref 6.5–8.1)

## 2020-12-27 LAB — CBC WITH DIFFERENTIAL (CANCER CENTER ONLY)
Abs Immature Granulocytes: 0.02 10*3/uL (ref 0.00–0.07)
Basophils Absolute: 0.1 10*3/uL (ref 0.0–0.1)
Basophils Relative: 1 %
Eosinophils Absolute: 0.1 10*3/uL (ref 0.0–0.5)
Eosinophils Relative: 2 %
HCT: 39 % (ref 39.0–52.0)
Hemoglobin: 12.7 g/dL — ABNORMAL LOW (ref 13.0–17.0)
Immature Granulocytes: 0 %
Lymphocytes Relative: 22 %
Lymphs Abs: 1.5 10*3/uL (ref 0.7–4.0)
MCH: 29.1 pg (ref 26.0–34.0)
MCHC: 32.6 g/dL (ref 30.0–36.0)
MCV: 89.4 fL (ref 80.0–100.0)
Monocytes Absolute: 0.6 10*3/uL (ref 0.1–1.0)
Monocytes Relative: 9 %
Neutro Abs: 4.8 10*3/uL (ref 1.7–7.7)
Neutrophils Relative %: 66 %
Platelet Count: 186 10*3/uL (ref 150–400)
RBC: 4.36 MIL/uL (ref 4.22–5.81)
RDW: 12.8 % (ref 11.5–15.5)
WBC Count: 7.1 10*3/uL (ref 4.0–10.5)
nRBC: 0 % (ref 0.0–0.2)

## 2020-12-27 LAB — VITAMIN B12: Vitamin B-12: 479 pg/mL (ref 180–914)

## 2020-12-27 MED ORDER — CYANOCOBALAMIN 1000 MCG/ML IJ SOLN
1000.0000 ug | Freq: Once | INTRAMUSCULAR | Status: AC
  Administered 2020-12-27: 1000 ug via INTRAMUSCULAR

## 2020-12-27 MED ORDER — CYANOCOBALAMIN 1000 MCG/ML IJ SOLN
INTRAMUSCULAR | Status: AC
  Filled 2020-12-27: qty 1

## 2020-12-27 NOTE — Patient Instructions (Signed)

## 2021-01-21 ENCOUNTER — Ambulatory Visit (INDEPENDENT_AMBULATORY_CARE_PROVIDER_SITE_OTHER): Payer: Medicare HMO | Admitting: Physician Assistant

## 2021-01-21 ENCOUNTER — Other Ambulatory Visit: Payer: Self-pay

## 2021-01-21 ENCOUNTER — Encounter: Payer: Self-pay | Admitting: Physician Assistant

## 2021-01-21 VITALS — BP 150/72 | HR 61 | Temp 98.7°F | Ht 71.0 in | Wt 204.5 lb

## 2021-01-21 DIAGNOSIS — L608 Other nail disorders: Secondary | ICD-10-CM | POA: Diagnosis not present

## 2021-01-21 NOTE — Progress Notes (Signed)
[name] is a 80 y.o. male here for a new problem.  I acted as a Education administrator for [employer], PA-C Anselmo Pickler, LPN   History of Present Illness:   Chief Complaint  Patient presents with  . Nail Problem    HPI    Nail problem Pt is c/o very brittle nails x 3 weeks, has ridges on them. Feels like this is a new finding. Denies: trauma to nails, fevers/chills, unintentional weight changes, night sweats.  He gets regular blood work through the cancer center for his B12 deficiency.  U/S of abdomen completed in 2019 - August. Hepatobiliary:  - No focal liver abnormalities are identified.  - Normal gallbladder  - CBD size is within normal limits.  - No intrahepatic biliary ductal dilatation.   Lab Results  Component Value Date   ALT 14 12/27/2020   AST 24 12/27/2020   ALKPHOS 41 12/27/2020   BILITOT 0.7 12/27/2020   Lab Results  Component Value Date   CREATININE 1.02 12/27/2020   BUN 15 12/27/2020   NA 140 12/27/2020   K 4.9 12/27/2020   CL 103 12/27/2020   CO2 26 12/27/2020       Past Medical History:  Diagnosis Date  . Abdominal wall defect, acquired   . Blockage of coronary artery of heart (HCC)    30%  . Hearing aid worn   . Insulin resistance   . LV dysfunction    reduction  . Osteoarthritis   . Pulmonary embolism (HCC)      Social History   Tobacco Use  . Smoking status: Former Smoker    Packs/day: 1.00    Years: 15.00    Pack years: 15.00    Quit date: 12/21/1985    Years since quitting: 35.1  . Smokeless tobacco: Never Used  . Tobacco comment: pack a day   Vaping Use  . Vaping Use: Never used  Substance Use Topics  . Alcohol use: No  . Drug use: No    Past Surgical History:  Procedure Laterality Date  . ACNE CYST REMOVAL  2012   Back   . APPENDECTOMY    . CARDIAC CATHETERIZATION    . COLECTOMY  11/2013   [facility], 2nd to diverticulitis  . RT hip replacement    . TONSILLECTOMY      Family History  Problem Relation  Age of Onset  . Heart disease Mother        pacemaker    Allergies  Allergen Reactions  . Tamsulosin Hcl Other (See Comments)    Pre-syncope Pre-syncope  . Flagyl [Metronidazole] Nausea And Vomiting    States IV form causes to allergy  . Lipitor [Atorvastatin] Other (See Comments)    Myalgia  . Tramadol Other (See Comments)    Dizziness    Current Medications:   Current Outpatient Medications:  .  apixaban (ELIQUIS) 5 MG TABS tablet, Take 5 mg by mouth 2 (two) times daily. , Disp: , Rfl:  .  fluocinonide (LIDEX) 0.05 % external solution, Apply topically., Disp: , Rfl:  .  levothyroxine (SYNTHROID) 75 MCG tablet, Take daily in AM on empty stomach., Disp: 90 tablet, Rfl: 2 .  lisinopril (PRINIVIL,ZESTRIL) 10 MG tablet, Take by mouth., Disp: , Rfl:  .  meclizine (ANTIVERT) 25 MG tablet, TAKE 1 TABLET BY MOUTH AS NEEDED, Disp: 20 tablet, Rfl: 0 .  meloxicam (MOBIC) 15 MG tablet, Take 1 tablet by mouth once daily, Disp: 90 tablet, Rfl: 0 .  metoprolol  tartrate (LOPRESSOR) 50 MG tablet, Take 50 mg by mouth 2 (two) times daily., Disp: , Rfl:  .  Multiple Vitamin (MULTIVITAMIN ADULT PO), Take 2 tablets by mouth daily., Disp: , Rfl:  .  omeprazole (PRILOSEC) 40 MG capsule, SMARTSIG:1 Capsule(s) By Mouth Every Evening, Disp: , Rfl:  .  simvastatin (ZOCOR) 40 MG tablet, Take 1 tablet (40 mg total) by mouth daily., Disp: 90 tablet, Rfl: 2 .  Zinc 50 MG TABS, Take 1 tablet by mouth daily., Disp: , Rfl:    Review of Systems:   ROS  Vitals:   Vitals:   01/21/21 1338  BP: (!) 150/72  Pulse: 61  Temp: 98.7 F (37.1 C)  TempSrc: Temporal  SpO2: 97%  Weight: 204 lb 8 oz (92.8 kg)  Height: 5\' 11"  (1.803 m)     Body mass index is 28.52 kg/m.  Physical Exam:   Physical Exam Vitals and nursing note reviewed.  Constitutional:      General: He is not in acute distress.    Appearance: He is well-developed. He is not ill-appearing, toxic-appearing or sickly-appearing.   Cardiovascular:     Rate and Rhythm: Normal rate and regular rhythm.     Pulses: Normal pulses.     Heart sounds: Normal heart sounds, S1 normal and S2 normal.     Comments: No LE edema Pulmonary:     Effort: Pulmonary effort is normal.     Breath sounds: Normal breath sounds.  Skin:    General: Skin is warm, dry and intact.     Comments: Proximal two-thirds of nail plate on all digits are white and distal one-third edge of nail bed shows red color   Neurological:     Mental Status: He is alert.     GCS: GCS eye subscore is 4. GCS verbal subscore is 5. GCS motor subscore is 6.  Psychiatric:        Mood and Affect: Mood and affect normal.        Speech: Speech normal.        Behavior: Behavior normal. Behavior is cooperative.     Assessment and Plan:   Dacen was seen today for nail problem.  Diagnoses and all orders for this visit:  Leukonychia   We reviewed his blood work today that was recently done and WNL. He does not want further testing. Discussed that per my research this can be associated with liver or renal failure. I recommended close follow-up with dermatology, whom he is already established with, to see on 01/30/21 and ask about this.  CMA or LPN served as scribe during this visit. History, Physical, and Plan performed by medical provider. The above documentation has been reviewed and is accurate and complete.  [name], PA-C

## 2021-01-21 NOTE — Patient Instructions (Signed)
It was great to see you!  I suspect that you have Apparent Leukonychia: Terry-type nails.   Please bring this up to your dermatologist to see if they agree and have any recommendations.  Take care,  [name] PA-C

## 2021-01-24 ENCOUNTER — Other Ambulatory Visit: Payer: Self-pay | Admitting: *Deleted

## 2021-01-24 DIAGNOSIS — E538 Deficiency of other specified B group vitamins: Secondary | ICD-10-CM

## 2021-01-27 ENCOUNTER — Inpatient Hospital Stay: Payer: Medicare HMO

## 2021-01-27 ENCOUNTER — Telehealth: Payer: Self-pay

## 2021-01-27 ENCOUNTER — Other Ambulatory Visit: Payer: Self-pay | Admitting: Physician Assistant

## 2021-01-27 NOTE — Telephone Encounter (Signed)
Pt called in to r/s his alb and inj today due to weather.  Pt req nest week and his other appts have been adjusted as well   Willys Salvino

## 2021-02-03 ENCOUNTER — Inpatient Hospital Stay: Payer: Medicare HMO | Attending: Family

## 2021-02-03 ENCOUNTER — Other Ambulatory Visit: Payer: Self-pay | Admitting: Hematology & Oncology

## 2021-02-03 ENCOUNTER — Other Ambulatory Visit: Payer: Self-pay

## 2021-02-03 ENCOUNTER — Inpatient Hospital Stay: Payer: Medicare HMO

## 2021-02-03 VITALS — BP 129/47 | HR 61 | Temp 98.4°F | Resp 18

## 2021-02-03 DIAGNOSIS — E538 Deficiency of other specified B group vitamins: Secondary | ICD-10-CM | POA: Insufficient documentation

## 2021-02-03 LAB — COMPREHENSIVE METABOLIC PANEL
ALT: 11 U/L (ref 0–44)
AST: 18 U/L (ref 15–41)
Albumin: 4.4 g/dL (ref 3.5–5.0)
Alkaline Phosphatase: 43 U/L (ref 38–126)
Anion gap: 6 (ref 5–15)
BUN: 15 mg/dL (ref 8–23)
CO2: 29 mmol/L (ref 22–32)
Calcium: 9.4 mg/dL (ref 8.9–10.3)
Chloride: 105 mmol/L (ref 98–111)
Creatinine, Ser: 1.13 mg/dL (ref 0.61–1.24)
GFR, Estimated: >60 mL/min (ref >60)
Glucose, Bld: 102 mg/dL — ABNORMAL HIGH (ref 70–99)
Potassium: 4.9 mmol/L (ref 3.5–5.1)
Sodium: 140 mmol/L (ref 135–145)
Total Bilirubin: 0.8 mg/dL (ref 0.3–1.2)
Total Protein: 6.2 g/dL — ABNORMAL LOW (ref 6.5–8.1)

## 2021-02-03 LAB — CBC WITH DIFFERENTIAL (CANCER CENTER ONLY)
Abs Immature Granulocytes: 0.04 10*3/uL (ref 0.00–0.07)
Basophils Absolute: 0 10*3/uL (ref 0.0–0.1)
Basophils Relative: 0 %
Eosinophils Absolute: 0.2 10*3/uL (ref 0.0–0.5)
Eosinophils Relative: 2 %
HCT: 38.4 % — ABNORMAL LOW (ref 39.0–52.0)
Hemoglobin: 12.7 g/dL — ABNORMAL LOW (ref 13.0–17.0)
Immature Granulocytes: 0 %
Lymphocytes Relative: 15 %
Lymphs Abs: 1.5 10*3/uL (ref 0.7–4.0)
MCH: 29.4 pg (ref 26.0–34.0)
MCHC: 33.1 g/dL (ref 30.0–36.0)
MCV: 88.9 fL (ref 80.0–100.0)
Monocytes Absolute: 0.7 10*3/uL (ref 0.1–1.0)
Monocytes Relative: 6 %
Neutro Abs: 8 10*3/uL — ABNORMAL HIGH (ref 1.7–7.7)
Neutrophils Relative %: 77 %
Platelet Count: 186 10*3/uL (ref 150–400)
RBC: 4.32 MIL/uL (ref 4.22–5.81)
RDW: 13 % (ref 11.5–15.5)
WBC Count: 10.5 10*3/uL (ref 4.0–10.5)
nRBC: 0 % (ref 0.0–0.2)

## 2021-02-03 LAB — VITAMIN B12: Vitamin B-12: 412 pg/mL (ref 180–914)

## 2021-02-03 MED ORDER — CYANOCOBALAMIN 1000 MCG/ML IJ SOLN
1000.0000 ug | Freq: Once | INTRAMUSCULAR | Status: AC
  Administered 2021-02-03: 1000 ug via INTRAMUSCULAR

## 2021-02-03 NOTE — Patient Instructions (Signed)

## 2021-02-08 ENCOUNTER — Other Ambulatory Visit: Payer: Self-pay | Admitting: Physician Assistant

## 2021-02-24 ENCOUNTER — Other Ambulatory Visit: Payer: Medicare HMO

## 2021-02-24 ENCOUNTER — Ambulatory Visit: Payer: Medicare HMO

## 2021-02-24 ENCOUNTER — Ambulatory Visit: Payer: Medicare HMO | Admitting: Hematology & Oncology

## 2021-03-03 ENCOUNTER — Other Ambulatory Visit: Payer: Self-pay

## 2021-03-03 ENCOUNTER — Encounter: Payer: Self-pay | Admitting: Hematology & Oncology

## 2021-03-03 ENCOUNTER — Inpatient Hospital Stay (HOSPITAL_BASED_OUTPATIENT_CLINIC_OR_DEPARTMENT_OTHER): Payer: Medicare HMO | Admitting: Hematology & Oncology

## 2021-03-03 ENCOUNTER — Telehealth: Payer: Self-pay

## 2021-03-03 ENCOUNTER — Inpatient Hospital Stay: Payer: Medicare HMO

## 2021-03-03 ENCOUNTER — Inpatient Hospital Stay: Payer: Medicare HMO | Attending: Family

## 2021-03-03 VITALS — BP 159/62 | HR 76 | Temp 98.5°F | Resp 18 | Wt 198.4 lb

## 2021-03-03 DIAGNOSIS — E538 Deficiency of other specified B group vitamins: Secondary | ICD-10-CM

## 2021-03-03 DIAGNOSIS — D510 Vitamin B12 deficiency anemia due to intrinsic factor deficiency: Secondary | ICD-10-CM

## 2021-03-03 HISTORY — DX: Vitamin B12 deficiency anemia due to intrinsic factor deficiency: D51.0

## 2021-03-03 LAB — CBC WITH DIFFERENTIAL (CANCER CENTER ONLY)
Abs Immature Granulocytes: 0.02 10*3/uL (ref 0.00–0.07)
Basophils Absolute: 0.1 10*3/uL (ref 0.0–0.1)
Basophils Relative: 1 %
Eosinophils Absolute: 0.1 10*3/uL (ref 0.0–0.5)
Eosinophils Relative: 2 %
HCT: 38 % — ABNORMAL LOW (ref 39.0–52.0)
Hemoglobin: 12.7 g/dL — ABNORMAL LOW (ref 13.0–17.0)
Immature Granulocytes: 0 %
Lymphocytes Relative: 22 %
Lymphs Abs: 1.5 10*3/uL (ref 0.7–4.0)
MCH: 29.4 pg (ref 26.0–34.0)
MCHC: 33.4 g/dL (ref 30.0–36.0)
MCV: 88 fL (ref 80.0–100.0)
Monocytes Absolute: 0.6 10*3/uL (ref 0.1–1.0)
Monocytes Relative: 9 %
Neutro Abs: 4.4 10*3/uL (ref 1.7–7.7)
Neutrophils Relative %: 66 %
Platelet Count: 172 10*3/uL (ref 150–400)
RBC: 4.32 MIL/uL (ref 4.22–5.81)
RDW: 13 % (ref 11.5–15.5)
WBC Count: 6.7 10*3/uL (ref 4.0–10.5)
nRBC: 0 % (ref 0.0–0.2)

## 2021-03-03 LAB — CMP (CANCER CENTER ONLY)
ALT: 11 U/L (ref 0–44)
AST: 19 U/L (ref 15–41)
Albumin: 4.5 g/dL (ref 3.5–5.0)
Alkaline Phosphatase: 45 U/L (ref 38–126)
Anion gap: 8 (ref 5–15)
BUN: 16 mg/dL (ref 8–23)
CO2: 29 mmol/L (ref 22–32)
Calcium: 9.6 mg/dL (ref 8.9–10.3)
Chloride: 104 mmol/L (ref 98–111)
Creatinine: 1.05 mg/dL (ref 0.61–1.24)
GFR, Estimated: >60 mL/min (ref >60)
Glucose, Bld: 97 mg/dL (ref 70–99)
Potassium: 4.2 mmol/L (ref 3.5–5.1)
Sodium: 141 mmol/L (ref 135–145)
Total Bilirubin: 0.8 mg/dL (ref 0.3–1.2)
Total Protein: 6.3 g/dL — ABNORMAL LOW (ref 6.5–8.1)

## 2021-03-03 LAB — VITAMIN B12: Vitamin B-12: 432 pg/mL (ref 180–914)

## 2021-03-03 LAB — SAVE SMEAR(SSMR), FOR PROVIDER SLIDE REVIEW

## 2021-03-03 LAB — LACTATE DEHYDROGENASE: LDH: 171 U/L (ref 98–192)

## 2021-03-03 MED ORDER — CYANOCOBALAMIN 1000 MCG/ML IJ SOLN
1000.0000 ug | Freq: Once | INTRAMUSCULAR | Status: AC
  Administered 2021-03-03: 1000 ug via INTRAMUSCULAR

## 2021-03-03 MED ORDER — CYANOCOBALAMIN 1000 MCG/ML IJ SOLN
INTRAMUSCULAR | Status: AC
  Filled 2021-03-03: qty 1

## 2021-03-03 NOTE — Progress Notes (Signed)
Hematology and Oncology Follow Up Visit  [name] [id number] [birth year] 80 y.o. 03/03/2021   Principle Diagnosis:  B 12 deficiency   Current Therapy:   Monthly B 12 injection   Interim History:  [name] is here today for follow-up.  Overall, he is doing quite well.  His last vitamin B12 level in February was 412.  We will have to see what today's level is.  Hopefully it is still on the normal side.  If so, we will plan for moving out his B12 injections to maybe every 3 weeks.    He is having some back pain.  This is chronic for him.  He does see a [name].  This does seem to help.  His last vitamin B12 level was 421.  Hopefully, if today's level is fine, we will move his injections out to every 3 months.  His COPD has been doing okay.  He has had no obvious bleeding.  There is no change in bowel or bladder habits.  Overall, his performance status is ECOG 1.   Medications:  Allergies as of 03/03/2021      Reactions   Tamsulosin Hcl Other (See Comments)   Pre-syncope Pre-syncope   Flagyl [metronidazole] Nausea And Vomiting   States IV form causes to allergy   Lipitor [atorvastatin] Other (See Comments)   Myalgia   Tramadol Other (See Comments)   Dizziness      Medication List       Accurate as of March 03, 2021  4:27 PM. If you have any questions, ask your nurse or doctor.        STOP taking these medications   clobetasol cream 0.05 % Commonly known as: TEMOVATE Stopped by: Volanda Napoleon, MD   fluocinonide 0.05 % external solution Commonly known as: LIDEX Stopped by: Volanda Napoleon, MD     TAKE these medications   acetaminophen 500 MG tablet Commonly known as: TYLENOL Take 1,000 mg by mouth every 6 (six) hours as needed.   apixaban 5 MG Tabs tablet Commonly known as: ELIQUIS Take 5 mg by mouth 2 (two) times daily.   hydrocortisone 2.5 % cream Apply topically.   levothyroxine 75 MCG tablet Commonly known as: SYNTHROID Take daily  in AM on empty stomach.   lisinopril 10 MG tablet Commonly known as: ZESTRIL Take by mouth in the morning and at bedtime.   meclizine 25 MG tablet Commonly known as: ANTIVERT TAKE 1 TABLET BY MOUTH AS NEEDED   meloxicam 15 MG tablet Commonly known as: MOBIC Take 1 tablet by mouth once daily   metoprolol tartrate 50 MG tablet Commonly known as: LOPRESSOR Take 50 mg by mouth 2 (two) times daily.   MULTIVITAMIN ADULT PO Take 2 tablets by mouth daily.   mupirocin ointment 2 % Commonly known as: BACTROBAN Apply topically 2 (two) times daily.   omeprazole 40 MG capsule Commonly known as: PRILOSEC SMARTSIG:1 Capsule(s) By Mouth Every Evening   simvastatin 40 MG tablet Commonly known as: ZOCOR Take 1 tablet by mouth once daily   Zinc 50 MG Tabs Take 1 tablet by mouth daily.       Allergies:  Allergies  Allergen Reactions  . Tamsulosin Hcl Other (See Comments)    Pre-syncope Pre-syncope  . Flagyl [Metronidazole] Nausea And Vomiting    States IV form causes to allergy  . Lipitor [Atorvastatin] Other (See Comments)    Myalgia  . Tramadol Other (See Comments)    Dizziness  Past Medical History, Surgical history, Social history, and Family History were reviewed and updated.  Review of Systems: Review of Systems  Constitutional: Negative.   HENT: Negative.   Eyes: Negative.   Respiratory: Negative.   Cardiovascular: Negative.   Gastrointestinal: Negative.   Genitourinary: Negative.   Musculoskeletal: Negative.   Skin: Negative.   Neurological: Negative.   Endo/Heme/Allergies: Negative.   Psychiatric/Behavioral: Negative.      Physical Exam:  weight is 198 lb 6.4 oz (90 kg). His oral temperature is 98.5 F (36.9 C). His blood pressure is 159/62 (abnormal) and his pulse is 76. His respiration is 18 and oxygen saturation is 100%.   Wt Readings from Last 3 Encounters:  03/03/21 198 lb 6.4 oz (90 kg)  01/21/21 204 lb 8 oz (92.8 kg)  11/27/20 199 lb (90.3  kg)    Physical Exam Vitals reviewed.  HENT:     Head: Normocephalic and atraumatic.  Eyes:     Pupils: Pupils are equal, round, and reactive to light.  Cardiovascular:     Rate and Rhythm: Normal rate and regular rhythm.     Heart sounds: Normal heart sounds.  Pulmonary:     Effort: Pulmonary effort is normal.     Breath sounds: Normal breath sounds.  Abdominal:     General: Bowel sounds are normal.     Palpations: Abdomen is soft.  Musculoskeletal:        General: No tenderness or deformity. Normal range of motion.     Cervical back: Normal range of motion.  Lymphadenopathy:     Cervical: No cervical adenopathy.  Skin:    General: Skin is warm and dry.     Findings: No erythema or rash.  Neurological:     Mental Status: He is alert and oriented to person, place, and time.  Psychiatric:        Behavior: Behavior normal.        Thought Content: Thought content normal.        Judgment: Judgment normal.      Lab Results  Component Value Date   WBC 6.7 03/03/2021   HGB 12.7 (L) 03/03/2021   HCT 38.0 (L) 03/03/2021   MCV 88.0 03/03/2021   PLT 172 03/03/2021   No results found for: FERRITIN, IRON, TIBC, UIBC, IRONPCTSAT Lab Results  Component Value Date   RBC 4.32 03/03/2021   No results found for: KPAFRELGTCHN, LAMBDASER, KAPLAMBRATIO No results found for: IGGSERUM, IGA, IGMSERUM No results found for: Odetta Pink, SPEI   Chemistry      Component Value Date/Time   NA 141 03/03/2021 1508   K 4.2 03/03/2021 1508   CL 104 03/03/2021 1508   CO2 29 03/03/2021 1508   BUN 16 03/03/2021 1508   CREATININE 1.05 03/03/2021 1508   CREATININE 1.02 03/09/2018 0838      Component Value Date/Time   CALCIUM 9.6 03/03/2021 1508   ALKPHOS 45 03/03/2021 1508   AST 19 03/03/2021 1508   ALT 11 03/03/2021 1508   BILITOT 0.8 03/03/2021 1508       Impression and Plan: [name] is a very pleasant 80 yo [place]  gentleman with history of B12 deficiency (diagnosed in 2019).   He received his B 12 injection today as planned. He will continue his monthly injection schedule for now.   We will see him again in 3 months for follow-up.   He can contact our office with nay questions or concerns. We  can certainly see him sooner if needed.   Volanda Napoleon, MD 3/14/20224:27 PM

## 2021-03-03 NOTE — Telephone Encounter (Signed)
Called and left a vm with appts and to view on my chart   anne

## 2021-03-04 ENCOUNTER — Telehealth: Payer: Self-pay | Admitting: *Deleted

## 2021-03-04 NOTE — Telephone Encounter (Signed)
-----   Message from Greg Napoleon, MD sent at 03/04/2021  6:54 AM EDT ----- Call - the B12 level is 432.   I think that we still need to do monthly b12 injections. [name]

## 2021-03-04 NOTE — Telephone Encounter (Signed)
Unable to reach pt. lmovm for pt with results and instructions to f/u for B12 injections. Message to scheduling.

## 2021-04-03 ENCOUNTER — Inpatient Hospital Stay: Payer: Medicare HMO | Attending: Family

## 2021-04-03 ENCOUNTER — Other Ambulatory Visit: Payer: Self-pay

## 2021-04-03 VITALS — BP 147/67 | HR 56 | Temp 98.1°F | Resp 18

## 2021-04-03 DIAGNOSIS — E538 Deficiency of other specified B group vitamins: Secondary | ICD-10-CM | POA: Insufficient documentation

## 2021-04-03 MED ORDER — CYANOCOBALAMIN 1000 MCG/ML IJ SOLN
INTRAMUSCULAR | Status: AC
  Filled 2021-04-03: qty 1

## 2021-04-03 MED ORDER — CYANOCOBALAMIN 1000 MCG/ML IJ SOLN
1000.0000 ug | Freq: Once | INTRAMUSCULAR | Status: AC
  Administered 2021-04-03: 1000 ug via INTRAMUSCULAR

## 2021-04-03 NOTE — Patient Instructions (Signed)

## 2021-04-04 ENCOUNTER — Other Ambulatory Visit: Payer: Self-pay | Admitting: Physician Assistant

## 2021-04-26 ENCOUNTER — Other Ambulatory Visit: Payer: Self-pay | Admitting: Physician Assistant

## 2021-04-28 ENCOUNTER — Ambulatory Visit: Payer: Medicare HMO

## 2021-05-02 ENCOUNTER — Inpatient Hospital Stay: Payer: Medicare HMO | Attending: Family

## 2021-05-07 ENCOUNTER — Other Ambulatory Visit: Payer: Self-pay | Admitting: Physician Assistant

## 2021-05-12 ENCOUNTER — Telehealth: Payer: Self-pay | Admitting: *Deleted

## 2021-05-12 NOTE — Telephone Encounter (Signed)
PA Case ID: [id number] - Rx #: [id number] Status Sent to Plan today Drug Meclizine HCl 25MG  tablets Form [employer] Electronic PA Form [phone] NCPDP)  Waiting for determination

## 2021-05-12 NOTE — Telephone Encounter (Signed)
Approved today Your request has been approved Pharmacy notified

## 2021-05-15 ENCOUNTER — Other Ambulatory Visit: Payer: Self-pay

## 2021-05-15 ENCOUNTER — Encounter (HOSPITAL_BASED_OUTPATIENT_CLINIC_OR_DEPARTMENT_OTHER): Payer: Self-pay | Admitting: *Deleted

## 2021-05-15 ENCOUNTER — Emergency Department (HOSPITAL_BASED_OUTPATIENT_CLINIC_OR_DEPARTMENT_OTHER): Payer: Medicare HMO

## 2021-05-15 ENCOUNTER — Emergency Department (HOSPITAL_BASED_OUTPATIENT_CLINIC_OR_DEPARTMENT_OTHER)
Admission: EM | Admit: 2021-05-15 | Discharge: 2021-05-15 | Disposition: A | Discharge status: Home / Self Care | Payer: Medicare HMO | Attending: Emergency Medicine | Admitting: Emergency Medicine

## 2021-05-15 DIAGNOSIS — E039 Hypothyroidism, unspecified: Secondary | ICD-10-CM | POA: Insufficient documentation

## 2021-05-15 DIAGNOSIS — R059 Cough, unspecified: Secondary | ICD-10-CM | POA: Diagnosis present

## 2021-05-15 DIAGNOSIS — U071 COVID-19: Secondary | ICD-10-CM | POA: Diagnosis not present

## 2021-05-15 DIAGNOSIS — I2510 Atherosclerotic heart disease of native coronary artery without angina pectoris: Secondary | ICD-10-CM | POA: Diagnosis not present

## 2021-05-15 DIAGNOSIS — Z87891 Personal history of nicotine dependence: Secondary | ICD-10-CM | POA: Diagnosis not present

## 2021-05-15 DIAGNOSIS — I110 Hypertensive heart disease with heart failure: Secondary | ICD-10-CM | POA: Insufficient documentation

## 2021-05-15 DIAGNOSIS — Z8673 Personal history of transient ischemic attack (TIA), and cerebral infarction without residual deficits: Secondary | ICD-10-CM | POA: Insufficient documentation

## 2021-05-15 DIAGNOSIS — J90 Pleural effusion, not elsewhere classified: Secondary | ICD-10-CM | POA: Insufficient documentation

## 2021-05-15 DIAGNOSIS — I5030 Unspecified diastolic (congestive) heart failure: Secondary | ICD-10-CM | POA: Insufficient documentation

## 2021-05-15 DIAGNOSIS — Z79899 Other long term (current) drug therapy: Secondary | ICD-10-CM | POA: Insufficient documentation

## 2021-05-15 DIAGNOSIS — Z7901 Long term (current) use of anticoagulants: Secondary | ICD-10-CM | POA: Diagnosis not present

## 2021-05-15 LAB — BASIC METABOLIC PANEL
Anion gap: 10 (ref 5–15)
BUN: 22 mg/dL (ref 8–23)
CO2: 24 mmol/L (ref 22–32)
Calcium: 8.6 mg/dL — ABNORMAL LOW (ref 8.9–10.3)
Chloride: 103 mmol/L (ref 98–111)
Creatinine, Ser: 0.88 mg/dL (ref 0.61–1.24)
GFR, Estimated: >60 mL/min (ref >60)
Glucose, Bld: 118 mg/dL — ABNORMAL HIGH (ref 70–99)
Potassium: 4.2 mmol/L (ref 3.5–5.1)
Sodium: 137 mmol/L (ref 135–145)

## 2021-05-15 LAB — CBC WITH DIFFERENTIAL/PLATELET
Abs Immature Granulocytes: 0.07 10*3/uL (ref 0.00–0.07)
Basophils Absolute: 0 10*3/uL (ref 0.0–0.1)
Basophils Relative: 0 %
Eosinophils Absolute: 0 10*3/uL (ref 0.0–0.5)
Eosinophils Relative: 0 %
HCT: 36.6 % — ABNORMAL LOW (ref 39.0–52.0)
Hemoglobin: 12.1 g/dL — ABNORMAL LOW (ref 13.0–17.0)
Immature Granulocytes: 1 %
Lymphocytes Relative: 18 %
Lymphs Abs: 1.1 10*3/uL (ref 0.7–4.0)
MCH: 28.7 pg (ref 26.0–34.0)
MCHC: 33.1 g/dL (ref 30.0–36.0)
MCV: 86.9 fL (ref 80.0–100.0)
Monocytes Absolute: 0.9 10*3/uL (ref 0.1–1.0)
Monocytes Relative: 14 %
Neutro Abs: 4.2 10*3/uL (ref 1.7–7.7)
Neutrophils Relative %: 67 %
Platelets: 167 10*3/uL (ref 150–400)
RBC: 4.21 MIL/uL — ABNORMAL LOW (ref 4.22–5.81)
RDW: 13.2 % (ref 11.5–15.5)
WBC: 6.3 10*3/uL (ref 4.0–10.5)
nRBC: 0 % (ref 0.0–0.2)

## 2021-05-15 MED ORDER — GUAIFENESIN-CODEINE 100-10 MG/5ML PO SYRP: 5.0000 mL | ORAL_SOLUTION | Freq: Four times a day (QID) | ORAL | 0 refills | Status: DC | PRN

## 2021-05-15 MED ORDER — FUROSEMIDE 20 MG PO TABS: 20.0000 mg | ORAL_TABLET | Freq: Every day | ORAL | 0 refills | Status: DC

## 2021-05-15 NOTE — ED Provider Notes (Signed)
[facility] EMERGENCY DEPARTMENT Provider Note   CSN: [id number] Arrival date & time: 05/15/21  1304     History Cough    [name] is a 80 y.o. male with past medical history significant for PE, A. fib on anticoagulation, CVA, CAD, chronic anemia who presents for evaluation of cough.  Tested positive for COVID on Saturday 6 days PTA.  Initially seen by PCP.  Started on prednisone, azithromycin, B12, zinc, Tessalon Perles.  Patient states he still has a cough.  He feels like he needs to "cough something up" but is not able to do this.  He feels like he has a rattle in his chest which was consistent with his prior history of pneumonia.  He denies fever, chills, nausea, vomiting, chest pain, shortness of breath abdominal pain, diarrhea, dysuria, lightheadedness, dizziness, paresthesias.  Denies any lower extremity edema.  He has not missed any doses of his anticoagulation.  Does feel generally fatigued.  Denies additional aggravating or alleviating factors.  Seen by provider earlier today.  Given albuterol, sent here to emergency department for chest x-ray to r/o PNA. Tolerating PO intake at home wo difficulty.  History obtained from patient and past medical records.  No interpreter used.  HPI     Past Medical History:  Diagnosis Date  . Abdominal wall defect, acquired   . Blockage of coronary artery of heart (HCC)    30%  . Hearing aid worn   . Insulin resistance   . LV dysfunction    reduction  . Osteoarthritis   . Pernicious anemia 03/03/2021  . Pulmonary embolism [facility])     Patient Active Problem List   Diagnosis Date Noted  . Pernicious anemia 03/03/2021  . Bilateral carotid artery stenosis 07/31/2020  . Wide-complex tachycardia ([place]) 11/11/2018  . Spinal cord infarction ([place]) 10/19/2018  . Numbness 10/19/2018  . Urinary retention 10/19/2018  . Hypotension 09/21/2018  . B12 deficiency 09/07/2018  . E. coli UTI 09/06/2018  . Ataxia 09/05/2018  . Lumbar  spondylosis 08/31/2018  . TIA (transient ischemic attack) 07/18/2018  . Visual disturbance 07/18/2018  . Cerebral venous sinus thrombosis 07/18/2018  . Current use of long term anticoagulation 11/18/2017  . Chronic pain of both knees 05/24/2017  . Cervical spondylosis without myelopathy 03/28/2017  . Pain in both hands 03/28/2017  . Thoracic aortic atherosclerosis ([place]) 03/04/2017  . Osteoarthritis 03/02/2017  . Community acquired pneumonia of left lower lobe of lung 03/02/2017  . Vertigo 03/02/2017  . Lumbar radiculopathy 05/10/2015  . Lumbar degenerative disc disease 05/10/2015  . Cardiomyopathy ([place]) 04/30/2015  . Mitral regurgitation 04/30/2015  . Arthritis of left hip 01/29/2015  . CAD (coronary artery disease) 05/21/2014  . Aortic sclerosis 04/09/2014  . History of pulmonary embolism 12/19/2013  . Diverticulitis of colon (without mention of hemorrhage)(562.11) 10/19/2013  . Diverticulitis of colon 08/26/2013  . Diastolic dysfunction 35/00/9381  . Pulmonary hypertension ([place]) 07/20/2013  . Mild aortic insufficiency 07/18/2013  . Hypokalemia 03/28/2013  . Paroxysmal atrial fibrillation ([place]) 02/03/2013  . Abnormal stress echo 01/18/2013  . Low back pain 12/05/2012  . Degenerative arthritis of left hip 12/05/2012  . Hard of hearing 08/28/2011  . TACHYCARDIA 07/23/2010  . ABNORMAL FINDINGS, ELEVATED BP W/O HTN 03/14/2007  . HYPERCHOLESTEROLEMIA 12/20/2006  . Hypothyroidism 11/09/2006    Past Surgical History:  Procedure Laterality Date  . ACNE CYST REMOVAL  2012   Back   . APPENDECTOMY    . CARDIAC CATHETERIZATION    . COLECTOMY  11/2013   [facility], 2nd to diverticulitis  . RT hip replacement    . TONSILLECTOMY         Family History  Problem Relation Age of Onset  . Heart disease Mother        pacemaker    Social History   Tobacco Use  . Smoking status: Former Smoker    Packs/day: 1.00    Years: 15.00    Pack years: 15.00    Quit date: 12/21/1985     Years since quitting: 35.4  . Smokeless tobacco: Never Used  . Tobacco comment: pack a day   Vaping Use  . Vaping Use: Never used  Substance Use Topics  . Alcohol use: No  . Drug use: No    Home Medications Prior to Admission medications   Medication Sig Start Date End Date Taking? Authorizing Provider  furosemide (LASIX) 20 MG tablet Take 1 tablet (20 mg total) by mouth daily for 4 days. 05/15/21 05/19/21 Yes Aalijah Lanphere A, PA-C  guaiFENesin-codeine (ROBITUSSIN AC) 100-10 MG/5ML syrup Take 5 mLs by mouth 4 (four) times daily as needed for cough. 05/15/21  Yes Nataleah Scioneaux A, PA-C  acetaminophen (TYLENOL) 500 MG tablet Take 1,000 mg by mouth every 6 (six) hours as needed.    [provider]  apixaban (ELIQUIS) 5 MG TABS tablet Take 5 mg by mouth 2 (two) times daily.  04/08/17   [provider]  hydrocortisone 2.5 % cream Apply topically. 01/24/21   [provider]  levothyroxine (SYNTHROID) 75 MCG tablet TAKE 1 TABLET BY MOUTH ONCE DAILY IN THE MORNING ON AN EMPTY STOMACH 04/07/21   [name], PA  lisinopril (PRINIVIL,ZESTRIL) 10 MG tablet Take by mouth in the morning and at bedtime. 12/19/18   [provider]  meclizine (ANTIVERT) 25 MG tablet TAKE 1 TABLET BY MOUTH AS NEEDED 05/07/21   Vivi Barrack, MD  meloxicam [facility]) 15 MG tablet Take 1 tablet by mouth once daily 04/30/21   [name], PA  metoprolol tartrate (LOPRESSOR) 50 MG tablet Take 50 mg by mouth 2 (two) times daily.    [provider]  Multiple Vitamin (MULTIVITAMIN ADULT PO) Take 2 tablets by mouth daily. 08/28/20   [provider]  mupirocin ointment (BACTROBAN) 2 % Apply topically 2 (two) times daily. Patient not taking: Reported on 03/03/2021 01/21/21   [provider]  omeprazole (PRILOSEC) 40 MG capsule SMARTSIG:1 Capsule(s) By Mouth Every Evening 01/08/21   [provider]  simvastatin (ZOCOR) 40 MG tablet Take 1 tablet by mouth once  daily 02/10/21   [name], PA  Zinc 50 MG TABS Take 1 tablet by mouth daily. 08/28/20   [provider]    Allergies    Tamsulosin hcl, Flagyl [metronidazole], Lipitor [atorvastatin], and Tramadol  Review of Systems   Review of Systems  Constitutional: Positive for fatigue. Negative for activity change, appetite change, chills, diaphoresis, fever and unexpected weight change.  HENT: Negative.   Respiratory: Positive for cough. Negative for apnea, choking, chest tightness, shortness of breath, wheezing and stridor.   Cardiovascular: Negative.   Gastrointestinal: Negative.   Genitourinary: Negative.   Musculoskeletal: Negative.   Skin: Negative.   Neurological: Negative.   All other systems reviewed and are negative.   Physical Exam Updated Vital Signs BP (!) 162/67 (BP Location: Right Arm)   Pulse (!) 57   Temp 98.2 F (36.8 C) (Oral)   Resp 18   Ht 5\' 11"  (1.803 m)  Wt 90 kg   SpO2 97%   BMI 27.67 kg/m   Physical Exam Vitals and nursing note reviewed.  Constitutional:      General: He is not in acute distress.    Appearance: He is well-developed. He is not ill-appearing, toxic-appearing or diaphoretic.  HENT:     Head: Normocephalic and atraumatic.     Nose: Nose normal.     Mouth/Throat:     Mouth: Mucous membranes are moist.  Eyes:     Pupils: Pupils are equal, round, and reactive to light.  Cardiovascular:     Rate and Rhythm: Normal rate and regular rhythm.     Pulses: Normal pulses.     Heart sounds: Normal heart sounds.  Pulmonary:     Effort: Pulmonary effort is normal. No respiratory distress.     Comments: Coarse expiratory lung sounds.  Speaks in full sentences without difficulty. Abdominal:     General: Bowel sounds are normal. There is no distension.     Palpations: Abdomen is soft.     Tenderness: There is no abdominal tenderness. There is no right CVA tenderness, left CVA tenderness, guarding or rebound.  Musculoskeletal:         General: Normal range of motion.     Cervical back: Normal range of motion and neck supple.     Comments: No bony tenderness.  Moves all 4 extremities without difficulty.  No lower extremity edema.  Skin:    General: Skin is warm and dry.     Capillary Refill: Capillary refill takes less than 2 seconds.     Comments: No rashes or lesions  Neurological:     General: No focal deficit present.     Mental Status: He is alert and oriented to person, place, and time.     Cranial Nerves: Cranial nerves are intact.     Sensory: Sensation is intact.     Motor: Motor function is intact.     Gait: Gait is intact.     Comments: Cranial nerves II through XII grossly intact Ambulatory without ataxic gait Intact sensation bilaterally Equal strength bilaterally     ED Results / Procedures / Treatments   Labs (all labs ordered are listed, but only abnormal results are displayed) Labs Reviewed  CBC WITH DIFFERENTIAL/PLATELET - Abnormal; Notable for the following components:      Result Value   RBC 4.21 (*)    Hemoglobin 12.1 (*)    HCT 36.6 (*)    All other components within normal limits  BASIC METABOLIC PANEL - Abnormal; Notable for the following components:   Glucose, Bld 118 (*)    Calcium 8.6 (*)    All other components within normal limits    EKG EKG Interpretation  Date/Time:  Thursday May 15 2021 13:32:47 EDT Ventricular Rate:  78 PR Interval:  174 QRS Duration: 101 QT Interval:  467 QTC Calculation: 422 R Axis:   13 Text Interpretation: Sinus rhythm Multiform ventricular premature complexes No STEMI Confirmed by Nanda Quinton [phone]) on 05/15/2021 1:55:14 PM   Radiology DG Chest Portable 1 View  Result Date: 05/15/2021 CLINICAL DATA:  Cough, COVID positive with cough for 4 days. EXAM: PORTABLE CHEST 1 VIEW COMPARISON:  March 01, 2017. FINDINGS: EKG leads project over the chest. Cardiomediastinal contours are stable though partially obscured at the LEFT lung base. Trachea is  midline. No lobar consolidative changes. Blunting of LEFT costodiaphragmatic sulcus. No acute skeletal process on limited assessment. IMPRESSION: 1. No lobar consolidative  changes. 2. Small LEFT effusion and basilar airspace disease. 3. Above process accentuated by prominent epicardial fat pad. Consider PA and lateral chest when the patient is able for further assessment. 4. No acute skeletal process on limited assessment. Electronically Signed   By: Zetta Bills M.D.   On: 05/15/2021 14:21    Procedures Procedures   Medications Ordered in ED Medications - No data to display  ED Course  I have reviewed the triage vital signs and the nursing notes.  Pertinent labs & imaging results that were available during my care of the patient were reviewed by me and considered in my medical decision making (see chart for details).  80 year old recently diagnosed with COVID 6 days ago presents for evaluation of cough.  He is afebrile, nonseptic, not ill-appearing.  On chronic anticoagulation for A. fib and prior PE which she is compliant with.  He appears clinically well-hydrated.  His abdomen is soft, nontender.  Nonfocal neuro exam without deficits.  Does not appear clinically fluid overloaded on exam.  His heart is clear.  He does have diffuse expiratory coarse lung sounds however no acute respiratory distress.  No hypoxia with ambulation, normal respiratory rate without tachycardia.  We will plan on labs, chest x-ray, EKG and reassess.  Labs and imaging personally reviewed and interpreted:  CBC without leukocytosis, hemoglobin 12.0, similar to prior BMP glucose 118, calcium 8.6, no additional electrolyte, renal or liver abnormality DG chest port small effusion, bibasilar airspace disease EKG no ischemic changes  Patient reassessed.  Tolerating p.o. intake without difficulty.  He continues without hypoxia.  Given small effusion, question for COVID, does have known history of CHF with EF of 45.  Does  not appear fluid over to lower extremities however will give short course of Lasix.  We will also treat with additional cough medicine.  Chest x-ray reassuring without evidence of pneumonia.   Unfortunately patient is outside treatment window for p.o. antivirals for COVID.  I discussed possible IV infusion.  He is unsure about this.  He states he will sleep on this overnight.  I discussed with him that this needs to be started within 7 days of his symptoms which would be tomorrow.  I placed ambulatory referral to [facility].  Given he is high risk given his comorbidities do feel he would benefit from outpatient IV infusion.  He does have inhaler at home which she was given by his PCP earlier today.  Low suspicion for sepsis, bacterial pneumonia, CHF exacerbation, PE, dissection, ACS.  The patient has been appropriately medically screened and/or stabilized in the ED. I have low suspicion for any other emergent medical condition which would require further screening, evaluation or treatment in the ED or require inpatient management.  Patient is hemodynamically stable and in no acute distress.  Patient able to ambulate in department prior to ED.  Evaluation does not show acute pathology that would require ongoing or additional emergent interventions while in the emergency department or further inpatient treatment.  I have discussed the diagnosis with the patient and answered all questions.  Pain is been managed while in the emergency department and patient has no further complaints prior to discharge.  Patient is comfortable with plan discussed in room and is stable for discharge at this time.  I have discussed strict return precautions for returning to the emergency department.  Patient was encouraged to follow-up with PCP/specialist refer to at discharge.  Discussed with attending, Dr. Laverta Baltimore who agrees with above treatment, plan  disposition.    MDM Rules/Calculators/A&P                           TAEVION SIKORA was evaluated in Emergency Department on 05/15/2021 for the symptoms described in the history of present illness. He was evaluated in the context of the global COVID-19 pandemic, which necessitated consideration that the patient might be at risk for infection with the SARS-CoV-2 virus that causes COVID-19. Institutional protocols and algorithms that pertain to the evaluation of patients at risk for COVID-19 are in a state of rapid change based on information released by regulatory bodies including the CDC and federal and state organizations. These policies and algorithms were followed during the patient's care in the ED. Final Clinical Impression(s) / ED Diagnoses Final diagnoses:  COVID  Cough  Pleural effusion    Rx / DC Orders ED Discharge Orders         Ordered    guaiFENesin-codeine [facility] AC) 100-10 MG/5ML syrup  4 times daily PRN        05/15/21 1450    furosemide (LASIX) 20 MG tablet  Daily        05/15/21 1450    Ambulatory referral for Covid Treatment        05/15/21 1455           Xzayvier Fagin A, PA-C 05/15/21 1506    Long, Wonda [facility], MD 05/17/21 [phone]

## 2021-05-15 NOTE — Discharge Instructions (Addendum)
Take the medications as prescribed.  Your x-ray did not show any pneumonia.  Did show a small amount of fluids which we discussed.  I have started you on Lasix, a fluid pill.  You will take 3 total of 4 days.  1 pill daily.  This medication may make you urinate more.  Have also prescribed you a cough syrup with codeine in it.  Use with caution as it may make you sleepy.  As discussed in the room I placed a referral for possible infusion.   Return for any worsening symptoms.

## 2021-05-15 NOTE — ED Triage Notes (Signed)
Covid positive. Here today with c.o cough x 4 days.

## 2021-05-16 ENCOUNTER — Telehealth: Payer: Self-pay

## 2021-05-16 NOTE — Telephone Encounter (Signed)
Called to discuss with patient about COVID-19 symptoms and the use of one of the available treatments for those with mild to moderate Covid symptoms and at a high risk of hospitalization.  Pt appears to qualify for outpatient treatment due to co-morbid conditions and/or a member of an at-risk group in accordance with the FDA Emergency Use Authorization.    Symptom onset: Cough 05/09/21 Vaccinated:  Booster?  Immunocompromised? No Qualifiers: CAD,HTN,PE,A-fib NIH Criteria: Tier 3  States he is scheduled for MAB today.   [name]

## 2021-05-17 ENCOUNTER — Encounter (HOSPITAL_BASED_OUTPATIENT_CLINIC_OR_DEPARTMENT_OTHER): Payer: Self-pay | Admitting: *Deleted

## 2021-05-17 ENCOUNTER — Emergency Department (HOSPITAL_BASED_OUTPATIENT_CLINIC_OR_DEPARTMENT_OTHER)
Admission: EM | Admit: 2021-05-17 | Discharge: 2021-05-17 | Disposition: A | Discharge status: Home / Self Care | Payer: Medicare HMO | Attending: Emergency Medicine | Admitting: Emergency Medicine

## 2021-05-17 ENCOUNTER — Other Ambulatory Visit: Payer: Self-pay

## 2021-05-17 DIAGNOSIS — E039 Hypothyroidism, unspecified: Secondary | ICD-10-CM | POA: Diagnosis not present

## 2021-05-17 DIAGNOSIS — I119 Hypertensive heart disease without heart failure: Secondary | ICD-10-CM | POA: Diagnosis not present

## 2021-05-17 DIAGNOSIS — Z79899 Other long term (current) drug therapy: Secondary | ICD-10-CM | POA: Insufficient documentation

## 2021-05-17 DIAGNOSIS — Z96641 Presence of right artificial hip joint: Secondary | ICD-10-CM | POA: Insufficient documentation

## 2021-05-17 DIAGNOSIS — Z7901 Long term (current) use of anticoagulants: Secondary | ICD-10-CM | POA: Insufficient documentation

## 2021-05-17 DIAGNOSIS — I2510 Atherosclerotic heart disease of native coronary artery without angina pectoris: Secondary | ICD-10-CM | POA: Insufficient documentation

## 2021-05-17 DIAGNOSIS — U071 COVID-19: Secondary | ICD-10-CM

## 2021-05-17 DIAGNOSIS — R509 Fever, unspecified: Secondary | ICD-10-CM | POA: Diagnosis present

## 2021-05-17 DIAGNOSIS — Z87891 Personal history of nicotine dependence: Secondary | ICD-10-CM | POA: Diagnosis not present

## 2021-05-17 NOTE — ED Notes (Signed)
AMA process explained and MSE waiver signed by patient. 

## 2021-05-17 NOTE — ED Triage Notes (Signed)
Pt reports he tested covid+ last Saturday. He was seen here this week for cough and was told he had "fluid on his lungs". He had covid antibody infusion yesterday. Today he felt SOB, had nausea, fatigue and chills so came in to be seen

## 2021-05-17 NOTE — ED Notes (Addendum)
Pt states reaction to monoclonal anitbody infusion that he received yesterday, states chills, n/v/fever all started last night in middle of the night.  States worsening SOB as well.  Work of breathing normal at this time, unlabored, spo2 98 on RA.  Pt states symptoms of reaction have subsided since onset last night.

## 2021-05-17 NOTE — ED Provider Notes (Signed)
[facility] EMERGENCY DEPARTMENT Provider Note   CSN: [id number] Arrival date & time: 05/17/21  1410     History Chief Complaint  Patient presents with  . Medication Reaction    Covid +    [name] is a 80 y.o. male.  Patient with hx recent dx covid 8 days ago, MAb infusion yesterday, presents indicating was concerned he possibly had adverse rxn to Mab infusion. States later last night, w episode nausea, vomiting, fever, and cough. Cough non productive, episodic, and improved today. No increased sob. No chest pain. No nv today, and is tolerating po. No rash, wheezing, or throat closing sensation. Is not vaccinated for covid.  Recently completed course zithromax and prednisone. Denies leg pain or swelling. No pleuritic pain.   The history is provided by the patient.       Past Medical History:  Diagnosis Date  . Abdominal wall defect, acquired   . Blockage of coronary artery of heart (HCC)    30%  . Hearing aid worn   . Insulin resistance   . LV dysfunction    reduction  . Osteoarthritis   . Pernicious anemia 03/03/2021  . Pulmonary embolism [facility])     Patient Active Problem List   Diagnosis Date Noted  . Pernicious anemia 03/03/2021  . Bilateral carotid artery stenosis 07/31/2020  . Wide-complex tachycardia ([place]) 11/11/2018  . Spinal cord infarction ([place]) 10/19/2018  . Numbness 10/19/2018  . Urinary retention 10/19/2018  . Hypotension 09/21/2018  . B12 deficiency 09/07/2018  . E. coli UTI 09/06/2018  . Ataxia 09/05/2018  . Lumbar spondylosis 08/31/2018  . TIA (transient ischemic attack) 07/18/2018  . Visual disturbance 07/18/2018  . Cerebral venous sinus thrombosis 07/18/2018  . Current use of long term anticoagulation 11/18/2017  . Chronic pain of both knees 05/24/2017  . Cervical spondylosis without myelopathy 03/28/2017  . Pain in both hands 03/28/2017  . Thoracic aortic atherosclerosis ([place]) 03/04/2017  . Osteoarthritis 03/02/2017  .  Community acquired pneumonia of left lower lobe of lung 03/02/2017  . Vertigo 03/02/2017  . Lumbar radiculopathy 05/10/2015  . Lumbar degenerative disc disease 05/10/2015  . Cardiomyopathy ([place]) 04/30/2015  . Mitral regurgitation 04/30/2015  . Arthritis of left hip 01/29/2015  . CAD (coronary artery disease) 05/21/2014  . Aortic sclerosis 04/09/2014  . History of pulmonary embolism 12/19/2013  . Diverticulitis of colon (without mention of hemorrhage)(562.11) 10/19/2013  . Diverticulitis of colon 08/26/2013  . Diastolic dysfunction 34/19/3790  . Pulmonary hypertension ([place]) 07/20/2013  . Mild aortic insufficiency 07/18/2013  . Hypokalemia 03/28/2013  . Paroxysmal atrial fibrillation ([place]) 02/03/2013  . Abnormal stress echo 01/18/2013  . Low back pain 12/05/2012  . Degenerative arthritis of left hip 12/05/2012  . Hard of hearing 08/28/2011  . TACHYCARDIA 07/23/2010  . ABNORMAL FINDINGS, ELEVATED BP W/O HTN 03/14/2007  . HYPERCHOLESTEROLEMIA 12/20/2006  . Hypothyroidism 11/09/2006    Past Surgical History:  Procedure Laterality Date  . ACNE CYST REMOVAL  2012   Back   . APPENDECTOMY    . CARDIAC CATHETERIZATION    . COLECTOMY  11/2013   [facility], 2nd to diverticulitis  . RT hip replacement    . TONSILLECTOMY         Family History  Problem Relation Age of Onset  . Heart disease Mother        pacemaker    Social History   Tobacco Use  . Smoking status: Former Smoker    Packs/day: 1.00    Years:  15.00    Pack years: 15.00    Quit date: 12/21/1985    Years since quitting: 35.4  . Smokeless tobacco: Never Used  . Tobacco comment: pack a day   Vaping Use  . Vaping Use: Never used  Substance Use Topics  . Alcohol use: No  . Drug use: No    Home Medications Prior to Admission medications   Medication Sig Start Date End Date Taking? Authorizing Provider  acetaminophen (TYLENOL) 500 MG tablet Take 1,000 mg by mouth every 6 (six) hours as needed.    [provider]  apixaban (ELIQUIS) 5 MG TABS tablet Take 5 mg by mouth 2 (two) times daily.  04/08/17   [provider]  furosemide (LASIX) 20 MG tablet Take 1 tablet (20 mg total) by mouth daily for 4 days. 05/15/21 05/19/21  Henderly, Britni A, PA-C  guaiFENesin-codeine (ROBITUSSIN AC) 100-10 MG/5ML syrup Take 5 mLs by mouth 4 (four) times daily as needed for cough. 05/15/21   Henderly, Britni A, PA-C  hydrocortisone 2.5 % cream Apply topically. 01/24/21   [provider]  levothyroxine (SYNTHROID) 75 MCG tablet TAKE 1 TABLET BY MOUTH ONCE DAILY IN THE MORNING ON AN EMPTY STOMACH 04/07/21   [name], PA  lisinopril (PRINIVIL,ZESTRIL) 10 MG tablet Take by mouth in the morning and at bedtime. 12/19/18   [provider]  meclizine (ANTIVERT) 25 MG tablet TAKE 1 TABLET BY MOUTH AS NEEDED 05/07/21   Vivi Barrack, MD  meloxicam [facility]) 15 MG tablet Take 1 tablet by mouth once daily 04/30/21   [name], PA  metoprolol tartrate (LOPRESSOR) 50 MG tablet Take 50 mg by mouth 2 (two) times daily.    [provider]  Multiple Vitamin (MULTIVITAMIN ADULT PO) Take 2 tablets by mouth daily. 08/28/20   [provider]  mupirocin ointment (BACTROBAN) 2 % Apply topically 2 (two) times daily. Patient not taking: Reported on 03/03/2021 01/21/21   [provider]  omeprazole (PRILOSEC) 40 MG capsule SMARTSIG:1 Capsule(s) By Mouth Every Evening 01/08/21   [provider]  simvastatin (ZOCOR) 40 MG tablet Take 1 tablet by mouth once daily 02/10/21   [name], PA  Zinc 50 MG TABS Take 1 tablet by mouth daily. 08/28/20   [provider]    Allergies    Tamsulosin hcl, Flagyl [metronidazole], Lipitor [atorvastatin], and Tramadol  Review of Systems   Review of Systems  Constitutional: Positive for fever.  HENT: Negative for trouble swallowing.   Eyes: Negative for redness.  Respiratory: Positive for cough. Negative for shortness of  breath.   Cardiovascular: Negative for chest pain.  Gastrointestinal: Negative for abdominal pain.  Genitourinary: Negative for dysuria and flank pain.  Musculoskeletal: Negative for back pain and neck pain.  Skin: Negative for rash.  Neurological: Negative for headaches.  Hematological: Does not bruise/bleed easily.  Psychiatric/Behavioral: Negative for confusion.    Physical Exam Updated Vital Signs BP (!) 159/63 (BP Location: Right Arm)   Pulse 64   Temp 98.6 F (37 C) (Oral)   Resp 15   Ht 1.803 m (5\' 11" )   Wt 87.1 kg   SpO2 98%   BMI 26.78 kg/m   Physical Exam Vitals and nursing note reviewed.  Constitutional:      Appearance: Normal appearance. He is well-developed.  HENT:     Head: Atraumatic.     Nose: Nose normal. No congestion or rhinorrhea.     Mouth/Throat:     Mouth: Mucous  membranes are moist.     Pharynx: Oropharynx is clear. No oropharyngeal exudate or posterior oropharyngeal erythema.  Eyes:     General: No scleral icterus.    Conjunctiva/sclera: Conjunctivae normal.  Neck:     Trachea: No tracheal deviation.     Comments: No stiffness or rigidity.  Cardiovascular:     Rate and Rhythm: Normal rate and regular rhythm.     Pulses: Normal pulses.     Heart sounds: Normal heart sounds. No murmur heard. No friction rub. No gallop.   Pulmonary:     Effort: Pulmonary effort is normal. No accessory muscle usage or respiratory distress.     Breath sounds: Normal breath sounds.  Abdominal:     General: Bowel sounds are normal. There is no distension.     Palpations: Abdomen is soft.     Tenderness: There is no abdominal tenderness. There is no guarding.  Genitourinary:    Comments: No cva tenderness. Musculoskeletal:        General: No swelling or tenderness.     Cervical back: Normal range of motion and neck supple. No rigidity.     Right lower leg: No edema.     Left lower leg: No edema.  Lymphadenopathy:     Cervical: No cervical adenopathy.   Skin:    General: Skin is warm and dry.     Findings: No rash.  Neurological:     Mental Status: He is alert.     Comments: Alert, speech clear.   Psychiatric:        Mood and Affect: Mood normal.     ED Results / Procedures / Treatments   Labs (all labs ordered are listed, but only abnormal results are displayed) Labs Reviewed - No data to display  EKG None  Radiology No results found.  Procedures Procedures   Medications Ordered in ED Medications - No data to display  ED Course  I have reviewed the triage vital signs and the nursing notes.  Pertinent labs & imaging results that were available during my care of the patient were reviewed by me and considered in my medical decision making (see chart for details).    MDM Rules/Calculators/A&P                          Reviewed nursing notes and prior charts for additional history.  Recent labs and cxr reviewed.   Currently, no nv. Abd soft, non tender. No increased wob. Pulse ox is 99-100% on room air. No chest pain. No leg pain or swelling.   Pt currently appears stable for d/c.   [name] was evaluated in Emergency Department on 05/17/2021 for the symptoms described in the history of present illness. He was evaluated in the context of the global COVID-19 pandemic, which necessitated consideration that the patient might be at risk for infection with the SARS-CoV-2 virus that causes COVID-19. Institutional protocols and algorithms that pertain to the evaluation of patients at risk for COVID-19 are in a state of rapid change based on information released by regulatory bodies including the CDC and federal and state organizations. These policies and algorithms were followed during the patient's care in the ED.  Return precautions provided.    Final Clinical Impression(s) / ED Diagnoses Final diagnoses:  None    Rx / DC Orders ED Discharge Orders    None       Lajean Saver, MD 05/17/21 1538

## 2021-05-17 NOTE — ED Notes (Signed)
PT on five-lead

## 2021-05-17 NOTE — Discharge Instructions (Addendum)
It was our pleasure to provide your ER care today - we hope that you feel better.  Drink plenty of fluids/stay well hydrated. Get adequate nutrition. Stay as active as possible, take full/deep breaths.  Take acetaminophen as need for fever. Take cough medication as need.   Follow up with primary care doctor for repeat CXR in 1-2 months to make sure previously noted changes clear.   Once over this covid illness, strongly consider covid vaccine for your health and safety.   Return to ER if worse, new symptoms, increased trouble breathing, chest pain, persistent vomiting, or other concern.

## 2021-05-29 ENCOUNTER — Inpatient Hospital Stay: Payer: Medicare HMO | Attending: Family

## 2021-05-29 ENCOUNTER — Other Ambulatory Visit: Payer: Self-pay

## 2021-05-29 VITALS — BP 145/59 | HR 59 | Temp 98.1°F | Resp 17

## 2021-05-29 DIAGNOSIS — E538 Deficiency of other specified B group vitamins: Secondary | ICD-10-CM | POA: Diagnosis not present

## 2021-05-29 MED ORDER — CYANOCOBALAMIN 1000 MCG/ML IJ SOLN
1000.0000 ug | Freq: Once | INTRAMUSCULAR | Status: AC
  Administered 2021-05-29: 1000 ug via INTRAMUSCULAR

## 2021-05-29 MED ORDER — CYANOCOBALAMIN 1000 MCG/ML IJ SOLN
INTRAMUSCULAR | Status: AC
  Filled 2021-05-29: qty 1

## 2021-05-29 NOTE — Patient Instructions (Signed)

## 2021-06-02 ENCOUNTER — Other Ambulatory Visit: Payer: Self-pay

## 2021-06-02 ENCOUNTER — Inpatient Hospital Stay: Payer: Medicare HMO

## 2021-06-02 VITALS — BP 120/58 | HR 66 | Temp 98.2°F | Resp 18

## 2021-06-02 DIAGNOSIS — E538 Deficiency of other specified B group vitamins: Secondary | ICD-10-CM

## 2021-06-02 MED ORDER — CYANOCOBALAMIN 1000 MCG/ML IJ SOLN
1000.0000 ug | Freq: Once | INTRAMUSCULAR | Status: AC
  Administered 2021-06-02: 1000 ug via INTRAMUSCULAR

## 2021-06-02 NOTE — Patient Instructions (Signed)
Vitamin B12 Injection What is this medication? Vitamin B12 (VAHY tuh min B12) prevents and treats low vitamin B12 levels in your body. It is used in people who do not get enough vitamin B12 from their diet or when their digestive tract does not absorb enough. Vitamin B12 plays an important role in maintaining the health of your nervous system and red bloodcells. This medicine may be used for other purposes; ask your health care provider orpharmacist if you have questions. COMMON BRAND NAME(S): B-12 Compliance Kit, B-12 Injection Kit, Cyomin, LA-12,Nutri-Twelve, Physicians EZ Use B-12, Primabalt What should I tell my care team before I take this medication? They need to know if you have any of these conditions: Kidney disease Leber's disease Megaloblastic anemia An unusual or allergic reaction to cyanocobalamin, cobalt, other medications, foods, dyes, or preservatives Pregnant or trying to get pregnant Breast-feeding How should I use this medication? This medication is injected into a muscle or deeply under the skin. It is usually given in a clinic or care team's office. However, your care team mayteach you how to inject yourself. Follow all instructions. Talk to your care team about the use of this medication in children. Specialcare may be needed. Overdosage: If you think you have taken too much of this medicine contact apoison control center or emergency room at once. NOTE: This medicine is only for you. Do not share this medicine with others. What if I miss a dose? If you are given your dose at a clinic or care team's office, call to reschedule your appointment. If you give your own injections, and you miss a dose, take it as soon as you can. If it is almost time for your next dose, takeonly that dose. Do not take double or extra doses. What may interact with this medication? Colchicine Heavy alcohol intake This list may not describe all possible interactions. Give your health care provider  a list of all the medicines, herbs, non-prescription drugs, or dietary supplements you use. Also tell them if you smoke, drink alcohol, or use illegaldrugs. Some items may interact with your medicine. What should I watch for while using this medication? Visit your care team regularly. You may need blood work done while you aretaking this medication. You may need to follow a special diet. Talk to your care team. Limit youralcohol intake and avoid smoking to get the best benefit. What side effects may I notice from receiving this medication? Side effects that you should report to your care team as soon as possible: Allergic reactions-skin rash, itching, hives, swelling of the face, lips, tongue, or throat Swelling of the ankles, hands, or feet Trouble breathing Side effects that usually do not require medical attention (report to your careteam if they continue or are bothersome): Diarrhea This list may not describe all possible side effects. Call your doctor for medical advice about side effects. You may report side effects to FDA at1-800-FDA-1088. Where should I keep my medication? Keep out of the reach of children. Store at room temperature between 15 and 30 degrees C (59 and 85 degrees F).Protect from light. Throw away any unused medication after the expiration date. NOTE: This sheet is a summary. It may not cover all possible information. If you have questions about this medicine, talk to your doctor, pharmacist, orhealth care provider.  2022 Elsevier/Gold Standard (2021-01-27 11:47:06)  

## 2021-06-13 ENCOUNTER — Emergency Department
Admission: EM | Admit: 2021-06-13 | Discharge: 2021-06-13 | Disposition: A | Discharge status: Home / Self Care | Payer: Medicare HMO | Source: Home / Self Care

## 2021-06-13 ENCOUNTER — Other Ambulatory Visit: Payer: Self-pay

## 2021-06-13 DIAGNOSIS — H9202 Otalgia, left ear: Secondary | ICD-10-CM

## 2021-06-13 NOTE — ED Provider Notes (Signed)
[name] CARE    CSN: [id number] Arrival date & time: 06/13/21  1848      History   Chief Complaint Chief Complaint  Patient presents with   Otalgia    RT    HPI [name] is a 80 y.o. male.   HPI 80 year old male presents with right ear pain for 1.5 hours.  Reports was eating dinner and felt shooting pain that radiated to jaw.  Currently wears hearing aids and or out while he is having pain.  No history of cerumen impaction. PMH significant for TIA, cerebral venous sinus thrombosis, and cervical spondylosis without myelopathy.  Past Medical History:  Diagnosis Date   Abdominal wall defect, acquired    Blockage of coronary artery of heart (HCC)    30%   Hearing aid worn    Insulin resistance    LV dysfunction    reduction   Osteoarthritis    Pernicious anemia 03/03/2021   Pulmonary embolism [facility])     Patient Active Problem List   Diagnosis Date Noted   Pernicious anemia 03/03/2021   Bilateral carotid artery stenosis 07/31/2020   Wide-complex tachycardia ([place]) 11/11/2018   Spinal cord infarction ([place]) 10/19/2018   Numbness 10/19/2018   Urinary retention 10/19/2018   Hypotension 09/21/2018   B12 deficiency 09/07/2018   E. coli UTI 09/06/2018   Ataxia 09/05/2018   Lumbar spondylosis 08/31/2018   TIA (transient ischemic attack) 07/18/2018   Visual disturbance 07/18/2018   Cerebral venous sinus thrombosis 07/18/2018   Current use of long term anticoagulation 11/18/2017   Chronic pain of both knees 05/24/2017   Cervical spondylosis without myelopathy 03/28/2017   Pain in both hands 03/28/2017   Thoracic aortic atherosclerosis ([place]) 03/04/2017   Osteoarthritis 03/02/2017   Community acquired pneumonia of left lower lobe of lung 03/02/2017   Vertigo 03/02/2017   Lumbar radiculopathy 05/10/2015   Lumbar degenerative disc disease 05/10/2015   Cardiomyopathy ([place]) 04/30/2015   Mitral regurgitation 04/30/2015   Arthritis of left hip 01/29/2015    CAD (coronary artery disease) 05/21/2014   Aortic sclerosis 04/09/2014   History of pulmonary embolism 12/19/2013   Diverticulitis of colon (without mention of hemorrhage)(562.11) 10/19/2013   Diverticulitis of colon 42/35/3614   Diastolic dysfunction 43/15/4008   Pulmonary hypertension ([place]) 07/20/2013   Mild aortic insufficiency 07/18/2013   Hypokalemia 03/28/2013   Paroxysmal atrial fibrillation ([place]) 02/03/2013   Abnormal stress echo 01/18/2013   Low back pain 12/05/2012   Degenerative arthritis of left hip 12/05/2012   Hard of hearing 08/28/2011   TACHYCARDIA 07/23/2010   ABNORMAL FINDINGS, ELEVATED BP W/O HTN 03/14/2007   HYPERCHOLESTEROLEMIA 12/20/2006   Hypothyroidism 11/09/2006    Past Surgical History:  Procedure Laterality Date   ACNE CYST REMOVAL  2012   Back    APPENDECTOMY     CARDIAC CATHETERIZATION     COLECTOMY  11/2013   [facility], 2nd to diverticulitis   RT hip replacement     TONSILLECTOMY         Home Medications    Prior to Admission medications   Medication Sig Start Date End Date Taking? Authorizing Provider  acetaminophen (TYLENOL) 500 MG tablet Take 1,000 mg by mouth every 6 (six) hours as needed.    [provider]  apixaban (ELIQUIS) 5 MG TABS tablet Take 5 mg by mouth 2 (two) times daily.  04/08/17   [provider]  furosemide (LASIX) 20 MG tablet Take 1 tablet (20 mg total) by mouth daily for 4  days. 05/15/21 05/19/21  Henderly, Britni A, PA-C  guaiFENesin-codeine (ROBITUSSIN AC) 100-10 MG/5ML syrup Take 5 mLs by mouth 4 (four) times daily as needed for cough. 05/15/21   Henderly, Britni A, PA-C  hydrocortisone 2.5 % cream Apply topically. 01/24/21   [provider]  levothyroxine (SYNTHROID) 75 MCG tablet TAKE 1 TABLET BY MOUTH ONCE DAILY IN THE MORNING ON AN EMPTY STOMACH 04/07/21   [name], PA  lisinopril (PRINIVIL,ZESTRIL) 10 MG tablet Take by mouth in the morning and at bedtime. 12/19/18   [provider]  meclizine (ANTIVERT) 25 MG tablet TAKE 1 TABLET BY MOUTH AS NEEDED 05/07/21   Vivi Barrack, MD  meloxicam [facility]) 15 MG tablet Take 1 tablet by mouth once daily 04/30/21   [name], PA  metoprolol tartrate (LOPRESSOR) 50 MG tablet Take 50 mg by mouth 2 (two) times daily.    [provider]  Multiple Vitamin (MULTIVITAMIN ADULT PO) Take 2 tablets by mouth daily. 08/28/20   [provider]  mupirocin ointment (BACTROBAN) 2 % Apply topically 2 (two) times daily. Patient not taking: Reported on 03/03/2021 01/21/21   [provider]  omeprazole (PRILOSEC) 40 MG capsule SMARTSIG:1 Capsule(s) By Mouth Every Evening 01/08/21   [provider]  simvastatin (ZOCOR) 40 MG tablet Take 1 tablet by mouth once daily 02/10/21   [name], PA  Zinc 50 MG TABS Take 1 tablet by mouth daily. 08/28/20   [provider]    Family History Family History  Problem Relation Age of Onset   Heart disease Mother        pacemaker    Social History Social History   Tobacco Use   Smoking status: Former    Packs/day: 1.00    Years: 15.00    Pack years: 15.00    Types: Cigarettes    Quit date: 12/21/1985    Years since quitting: 35.5   Smokeless tobacco: Never   Tobacco comments:    pack a day   Vaping Use   Vaping Use: Never used  Substance Use Topics   Alcohol use: No   Drug use: No     Allergies   Tamsulosin hcl, Flagyl [metronidazole], Lipitor [atorvastatin], and Tramadol   Review of Systems Review of Systems  Constitutional: Negative.   HENT:  Positive for ear pain.   Eyes: Negative.   Respiratory: Negative.    Cardiovascular: Negative.   Gastrointestinal: Negative.   Genitourinary: Negative.   Musculoskeletal: Negative.   Neurological: Negative.     Physical Exam Triage Vital Signs ED Triage Vitals  Enc Vitals Group     BP 06/13/21 1907 130/64     Pulse Rate 06/13/21 1907 70     Resp 06/13/21 1907 18     Temp 06/13/21 1907  98.4 F (36.9 C)     Temp Source 06/13/21 1907 Oral     SpO2 06/13/21 1907 96 %     Weight --      Height --      Head Circumference --      Peak Flow --      Pain Score 06/13/21 1909 5     Pain Loc --      Pain Edu? --      Excl. in [place]? --    No data found.  Updated Vital Signs BP 130/64 (BP Location: Right Arm)   Pulse 70   Temp 98.4 F (36.9 C) (Oral)   Resp 18   SpO2  96%   Physical Exam Vitals and nursing note reviewed.  Constitutional:      General: He is not in acute distress.    Appearance: Normal appearance. He is normal weight.  HENT:     Head: Normocephalic and atraumatic.     Right Ear: Tympanic membrane, ear canal and external ear normal.     Left Ear: Tympanic membrane, ear canal and external ear normal.     Mouth/Throat:     Mouth: Mucous membranes are moist.     Pharynx: Oropharynx is clear.  Eyes:     Extraocular Movements: Extraocular movements intact.     Conjunctiva/sclera: Conjunctivae normal.     Pupils: Pupils are equal, round, and reactive to light.  Neck:     Comments: No JVD, no bruit Cardiovascular:     Rate and Rhythm: Normal rate and regular rhythm.     Pulses: Normal pulses.     Heart sounds: Normal heart sounds.  Pulmonary:     Effort: Pulmonary effort is normal.     Breath sounds: Normal breath sounds.     Comments: No adventitious breath sounds Musculoskeletal:        General: Normal range of motion.     Cervical back: Normal range of motion and neck supple. No tenderness.  Lymphadenopathy:     Cervical: No cervical adenopathy.  Skin:    General: Skin is warm and dry.  Neurological:     General: No focal deficit present.     Mental Status: He is alert and oriented to person, place, and time.  Psychiatric:        Mood and Affect: Mood normal.        Behavior: Behavior normal.     UC Treatments / Results  Labs (all labs ordered are listed, but only abnormal results are displayed) Labs Reviewed - No data to  display  EKG   Radiology No results found.  Procedures Procedures (including critical care time)  Medications Ordered in UC Medications - No data to display  Initial Impression / Assessment and Plan / UC Course  I have reviewed the triage vital signs and the nursing notes.  Pertinent labs & imaging results that were available during my care of the patient were reviewed by me and considered in my medical decision making (see chart for details).     MDM: 1.  Otalgia of left ear.  Upon exam today patient reports shooting pain of left ear that he experienced 1.5 hours ago is now resolved.  Advised patient if symptoms return please go to [facility] immediate evaluation given PMH. Final Clinical Impressions(s) / UC Diagnoses   Final diagnoses:  Otalgia of left ear     Discharge Instructions      Advised patient if sharp radiating pain adjacent to left ear reoccurs, please go to [facility] for further evaluation.     ED Prescriptions   None    PDMP not reviewed this encounter.   Eliezer Lofts, [place] 06/13/21 2039

## 2021-06-13 NOTE — ED Triage Notes (Signed)
Pt c/o RT ear pain x 1.5 hours. Says he was eating dinner and felt a shooting pain that radiated to jaw. Wears hearing aids, took it out and was still having pain. No hx of cerumen impactions.

## 2021-06-13 NOTE — Discharge Instructions (Addendum)
Advised patient if sharp radiating pain adjacent to left ear reoccurs, please go to [facility] for further evaluation.

## 2021-06-27 ENCOUNTER — Other Ambulatory Visit: Payer: Self-pay

## 2021-06-27 ENCOUNTER — Inpatient Hospital Stay: Payer: Medicare HMO | Attending: Family

## 2021-06-27 VITALS — BP 123/64 | HR 70 | Temp 98.1°F | Resp 16

## 2021-06-27 DIAGNOSIS — E538 Deficiency of other specified B group vitamins: Secondary | ICD-10-CM

## 2021-06-27 MED ORDER — CYANOCOBALAMIN 1000 MCG/ML IJ SOLN
1000.0000 ug | Freq: Once | INTRAMUSCULAR | Status: AC
  Administered 2021-06-27: 1000 ug via INTRAMUSCULAR

## 2021-06-27 MED ORDER — CYANOCOBALAMIN 1000 MCG/ML IJ SOLN
INTRAMUSCULAR | Status: AC
  Filled 2021-06-27: qty 1

## 2021-06-27 NOTE — Patient Instructions (Signed)
Vitamin B12 Deficiency Vitamin B12 deficiency means that your body does not have enough vitamin B12. The body needs this vitamin: To make red blood cells. To make genes (DNA). To help the nerves work. If you do not have enough vitamin B12 in your body, you can have health problems. What are the causes? Not eating enough foods that contain vitamin B12. Not being able to absorb vitamin B12 from the food that you eat. Certain digestive system diseases. A condition in which the body does not make enough of a certain protein, which results in too few red blood cells (pernicious anemia). Having a surgery in which part of the stomach or small intestine is removed. Taking medicines that make it hard for the body to absorb vitamin B12. These medicines include: Heartburn medicines. Some antibiotic medicines. Other medicines that are used to treat certain conditions. What increases the risk? Being older than age 50. Eating a vegetarian or vegan diet, especially while you are pregnant. Eating a poor diet while you are pregnant. Taking certain medicines. Having alcoholism. What are the signs or symptoms? In some cases, there are no symptoms. If the condition leads to too few blood cells or nerve damage, symptoms can occur, such as: Feeling weak. Feeling tired (fatigued). Not being hungry. Weight loss. A loss of feeling (numbness) or tingling in your hands and feet. Redness and burning of the tongue. Being mixed up (confused) or having memory problems. Sadness (depression). Problems with your senses. This can include color blindness, ringing in the ears, or loss of taste. Watery poop (diarrhea) or trouble pooping (constipation). Trouble walking. If anemia is very bad, symptoms can include: Being short of breath. Being dizzy. Having a very fast heartbeat. How is this treated? Changing the way you eat and drink, such as: Eating more foods that contain vitamin B12. Drinking little or no  alcohol. Getting vitamin B12 shots. Taking vitamin B12 supplements. Your doctor will tell you the dose that is best for you. Follow these instructions at home: Eating and drinking  Eat lots of healthy foods that contain vitamin B12. These include: Meats and poultry, such as beef, pork, chicken, turkey, and organ meats, such as liver. Seafood, such as clams, rainbow trout, salmon, tuna, and haddock. Eggs. Cereal and dairy products that have vitamin B12 added to them. Check the label. The items listed above may not be a complete list of what you can eat and drink. Contact a dietitian for more options. General instructions Get any shots as told by your doctor. Take supplements only as told by your doctor. Do not drink alcohol if your doctor tells you not to. In some cases, you may only be asked to limit alcohol use. Keep all follow-up visits as told by your doctor. This is important. Contact a doctor if: Your symptoms come back. Get help right away if: You have trouble breathing. You have a very fast heartbeat. You have chest pain. You get dizzy. You pass out. Summary Vitamin B12 deficiency means that your body is not getting enough vitamin B12. In some cases, there are no symptoms of this condition. Treatment may include making a change in the way you eat and drink, getting vitamin B12 shots, or taking supplements. Eat lots of healthy foods that contain vitamin B12. This information is not intended to replace advice given to you by your health care provider. Make sure you discuss any questions you have with your health care provider. Document Revised: 08/16/2018 Document Reviewed: 08/16/2018 Elsevier Patient Education    2022 Elsevier Inc.  

## 2021-07-02 ENCOUNTER — Inpatient Hospital Stay: Payer: Medicare HMO

## 2021-07-06 ENCOUNTER — Other Ambulatory Visit: Payer: Self-pay | Admitting: Physician Assistant

## 2021-07-27 ENCOUNTER — Other Ambulatory Visit: Payer: Self-pay | Admitting: Physician Assistant

## 2021-07-28 ENCOUNTER — Inpatient Hospital Stay: Payer: Medicare HMO | Attending: Family

## 2021-07-28 ENCOUNTER — Other Ambulatory Visit: Payer: Self-pay

## 2021-07-28 VITALS — BP 152/62 | HR 74 | Temp 98.1°F | Resp 17

## 2021-07-28 DIAGNOSIS — E538 Deficiency of other specified B group vitamins: Secondary | ICD-10-CM | POA: Diagnosis present

## 2021-07-28 MED ORDER — CYANOCOBALAMIN 1000 MCG/ML IJ SOLN
1000.0000 ug | Freq: Once | INTRAMUSCULAR | Status: AC
  Administered 2021-07-28: 1000 ug via INTRAMUSCULAR

## 2021-07-28 NOTE — Patient Instructions (Signed)

## 2021-08-01 ENCOUNTER — Inpatient Hospital Stay: Payer: Medicare HMO

## 2021-08-01 ENCOUNTER — Other Ambulatory Visit: Payer: Self-pay

## 2021-08-01 VITALS — BP 142/53 | HR 72 | Temp 98.5°F | Resp 17

## 2021-08-01 DIAGNOSIS — E538 Deficiency of other specified B group vitamins: Secondary | ICD-10-CM | POA: Diagnosis not present

## 2021-08-01 MED ORDER — CYANOCOBALAMIN 1000 MCG/ML IJ SOLN
1000.0000 ug | Freq: Once | INTRAMUSCULAR | Status: AC
  Administered 2021-08-01: 1000 ug via INTRAMUSCULAR

## 2021-08-01 NOTE — Patient Instructions (Signed)
Vitamin B12 Deficiency Vitamin B12 deficiency means that your body does not have enough vitamin B12. The body needs this vitamin: To make red blood cells. To make genes (DNA). To help the nerves work. If you do not have enough vitamin B12 in your body, you can have health problems. What are the causes? Not eating enough foods that contain vitamin B12. Not being able to absorb vitamin B12 from the food that you eat. Certain digestive system diseases. A condition in which the body does not make enough of a certain protein, which results in too few red blood cells (pernicious anemia). Having a surgery in which part of the stomach or small intestine is removed. Taking medicines that make it hard for the body to absorb vitamin B12. These medicines include: Heartburn medicines. Some antibiotic medicines. Other medicines that are used to treat certain conditions. What increases the risk? Being older than age 50. Eating a vegetarian or vegan diet, especially while you are pregnant. Eating a poor diet while you are pregnant. Taking certain medicines. Having alcoholism. What are the signs or symptoms? In some cases, there are no symptoms. If the condition leads to too few blood cells or nerve damage, symptoms can occur, such as: Feeling weak. Feeling tired (fatigued). Not being hungry. Weight loss. A loss of feeling (numbness) or tingling in your hands and feet. Redness and burning of the tongue. Being mixed up (confused) or having memory problems. Sadness (depression). Problems with your senses. This can include color blindness, ringing in the ears, or loss of taste. Watery poop (diarrhea) or trouble pooping (constipation). Trouble walking. If anemia is very bad, symptoms can include: Being short of breath. Being dizzy. Having a very fast heartbeat. How is this treated? Changing the way you eat and drink, such as: Eating more foods that contain vitamin B12. Drinking little or no  alcohol. Getting vitamin B12 shots. Taking vitamin B12 supplements. Your doctor will tell you the dose that is best for you. Follow these instructions at home: Eating and drinking  Eat lots of healthy foods that contain vitamin B12. These include: Meats and poultry, such as beef, pork, chicken, turkey, and organ meats, such as liver. Seafood, such as clams, rainbow trout, salmon, tuna, and haddock. Eggs. Cereal and dairy products that have vitamin B12 added to them. Check the label. The items listed above may not be a complete list of what you can eat and drink. Contact a dietitian for more options. General instructions Get any shots as told by your doctor. Take supplements only as told by your doctor. Do not drink alcohol if your doctor tells you not to. In some cases, you may only be asked to limit alcohol use. Keep all follow-up visits as told by your doctor. This is important. Contact a doctor if: Your symptoms come back. Get help right away if: You have trouble breathing. You have a very fast heartbeat. You have chest pain. You get dizzy. You pass out. Summary Vitamin B12 deficiency means that your body is not getting enough vitamin B12. In some cases, there are no symptoms of this condition. Treatment may include making a change in the way you eat and drink, getting vitamin B12 shots, or taking supplements. Eat lots of healthy foods that contain vitamin B12. This information is not intended to replace advice given to you by your health care provider. Make sure you discuss any questions you have with your health care provider. Document Revised: 08/16/2018 Document Reviewed: 08/16/2018 Elsevier Patient Education    2022 Elsevier Inc.  

## 2021-08-06 ENCOUNTER — Telehealth: Payer: Self-pay

## 2021-08-20 ENCOUNTER — Other Ambulatory Visit: Payer: Self-pay | Admitting: Physician Assistant

## 2021-08-28 ENCOUNTER — Other Ambulatory Visit: Payer: Medicare HMO

## 2021-08-28 ENCOUNTER — Ambulatory Visit: Payer: Medicare HMO

## 2021-08-28 ENCOUNTER — Ambulatory Visit: Payer: Medicare HMO | Admitting: Hematology & Oncology

## 2021-09-01 ENCOUNTER — Ambulatory Visit: Payer: Medicare HMO

## 2021-09-01 ENCOUNTER — Ambulatory Visit: Payer: Medicare HMO | Admitting: Hematology & Oncology

## 2021-09-01 ENCOUNTER — Other Ambulatory Visit: Payer: Medicare HMO

## 2021-09-08 ENCOUNTER — Ambulatory Visit: Payer: Medicare HMO | Admitting: Hematology & Oncology

## 2021-09-08 ENCOUNTER — Other Ambulatory Visit: Payer: Medicare HMO

## 2021-09-08 ENCOUNTER — Ambulatory Visit: Payer: Medicare HMO

## 2021-09-16 ENCOUNTER — Telehealth: Payer: Self-pay | Admitting: Hematology & Oncology

## 2021-09-16 ENCOUNTER — Inpatient Hospital Stay: Payer: Medicare HMO

## 2021-09-16 ENCOUNTER — Inpatient Hospital Stay: Payer: Medicare HMO | Attending: Family

## 2021-09-16 ENCOUNTER — Encounter: Payer: Self-pay | Admitting: Hematology & Oncology

## 2021-09-16 ENCOUNTER — Other Ambulatory Visit: Payer: Self-pay

## 2021-09-16 ENCOUNTER — Inpatient Hospital Stay (HOSPITAL_BASED_OUTPATIENT_CLINIC_OR_DEPARTMENT_OTHER): Payer: Medicare HMO | Admitting: Hematology & Oncology

## 2021-09-16 VITALS — BP 150/61 | HR 59 | Temp 98.7°F | Resp 16

## 2021-09-16 DIAGNOSIS — E538 Deficiency of other specified B group vitamins: Secondary | ICD-10-CM | POA: Insufficient documentation

## 2021-09-16 DIAGNOSIS — D510 Vitamin B12 deficiency anemia due to intrinsic factor deficiency: Secondary | ICD-10-CM

## 2021-09-16 DIAGNOSIS — I420 Dilated cardiomyopathy: Secondary | ICD-10-CM | POA: Diagnosis not present

## 2021-09-16 LAB — CMP (CANCER CENTER ONLY)
ALT: 13 U/L (ref 0–44)
AST: 22 U/L (ref 15–41)
Albumin: 4.2 g/dL (ref 3.5–5.0)
Alkaline Phosphatase: 41 U/L (ref 38–126)
Anion gap: 8 (ref 5–15)
BUN: 17 mg/dL (ref 8–23)
CO2: 28 mmol/L (ref 22–32)
Calcium: 9.3 mg/dL (ref 8.9–10.3)
Chloride: 107 mmol/L (ref 98–111)
Creatinine: 1.02 mg/dL (ref 0.61–1.24)
GFR, Estimated: >60 mL/min (ref >60)
Glucose, Bld: 99 mg/dL (ref 70–99)
Potassium: 5.1 mmol/L (ref 3.5–5.1)
Sodium: 143 mmol/L (ref 135–145)
Total Bilirubin: 0.7 mg/dL (ref 0.3–1.2)
Total Protein: 6.5 g/dL (ref 6.5–8.1)

## 2021-09-16 LAB — CBC WITH DIFFERENTIAL (CANCER CENTER ONLY)
Abs Immature Granulocytes: 0.01 10*3/uL (ref 0.00–0.07)
Basophils Absolute: 0 10*3/uL (ref 0.0–0.1)
Basophils Relative: 1 %
Eosinophils Absolute: 0.2 10*3/uL (ref 0.0–0.5)
Eosinophils Relative: 2 %
HCT: 36.5 % — ABNORMAL LOW (ref 39.0–52.0)
Hemoglobin: 12.4 g/dL — ABNORMAL LOW (ref 13.0–17.0)
Immature Granulocytes: 0 %
Lymphocytes Relative: 21 %
Lymphs Abs: 1.3 10*3/uL (ref 0.7–4.0)
MCH: 30.7 pg (ref 26.0–34.0)
MCHC: 34 g/dL (ref 30.0–36.0)
MCV: 90.3 fL (ref 80.0–100.0)
Monocytes Absolute: 0.6 10*3/uL (ref 0.1–1.0)
Monocytes Relative: 9 %
Neutro Abs: 4.2 10*3/uL (ref 1.7–7.7)
Neutrophils Relative %: 67 %
Platelet Count: 192 10*3/uL (ref 150–400)
RBC: 4.04 MIL/uL — ABNORMAL LOW (ref 4.22–5.81)
RDW: 13.7 % (ref 11.5–15.5)
WBC Count: 6.3 10*3/uL (ref 4.0–10.5)
nRBC: 0 % (ref 0.0–0.2)

## 2021-09-16 LAB — VITAMIN B12: Vitamin B-12: 478 pg/mL (ref 180–914)

## 2021-09-16 MED ORDER — CYANOCOBALAMIN 1000 MCG/ML IJ SOLN
1000.0000 ug | Freq: Once | INTRAMUSCULAR | Status: AC
  Administered 2021-09-16: 1000 ug via INTRAMUSCULAR
  Filled 2021-09-16: qty 1

## 2021-09-16 NOTE — Telephone Encounter (Signed)
Scheduled appt per 9/27 los -mailed letter with appt date and time

## 2021-09-16 NOTE — Progress Notes (Signed)
Hematology and Oncology Follow Up Visit  ARMONIE STATEN [id number] 04-Jan-1941 80 y.o. 09/16/2021   Principle Diagnosis:  B 12 deficiency   Current Therapy:   Vitamin B12 injection -- q 3 months   Interim History:  Mr. Levesque is here today for follow-up.  He seems to be doing pretty well.  We see him every 6 months.  He gets his vitamin B-12 every 3 months.  His last B12 level was 432 back in March.  He still has issues with respect to neuropathy.  This happened after he had a lumbar injection.  He has little bit of shortness of breath.  Sounds like he has some cardiomyopathy.  He may have some underlying COPD.  He has had no rashes.  He has had no change in bowel or bladder habits.  He has had no headache..  He does have some tingling in his feet.  Again I suspect this is probably from neuropathy.  Overall, I would say his performance status is probably ECOG 1.     Medications:  Allergies as of 09/16/2021       Reactions   Tamsulosin Hcl Other (See Comments)   Pre-syncope Pre-syncope   Flagyl [metronidazole] Nausea And Vomiting   States IV form causes to allergy   Lipitor [atorvastatin] Other (See Comments)   Myalgia   Tramadol Other (See Comments)   Dizziness        Medication List        Accurate as of September 16, 2021 10:30 AM. If you have any questions, ask your nurse or doctor.          acetaminophen 500 MG tablet Commonly known as: TYLENOL Take 1,000 mg by mouth every 6 (six) hours as needed.   apixaban 5 MG Tabs tablet Commonly known as: ELIQUIS Take 5 mg by mouth 2 (two) times daily.   Euthyrox 75 MCG tablet Generic drug: levothyroxine TAKE 1 TABLET BY MOUTH ONCE DAILY IN THE MORNING ON AN EMPTY STOMACH. NEEDS TO MAKE AN APPOINTMENT   furosemide 20 MG tablet Commonly known as: LASIX Take 1 tablet (20 mg total) by mouth daily for 4 days.   guaiFENesin-codeine 100-10 MG/5ML syrup Commonly known as: ROBITUSSIN AC Take 5 mLs by mouth 4  (four) times daily as needed for cough.   hydrocortisone 2.5 % cream Apply topically.   lisinopril 10 MG tablet Commonly known as: ZESTRIL Take by mouth in the morning and at bedtime.   meclizine 25 MG tablet Commonly known as: ANTIVERT TAKE 1 TABLET BY MOUTH AS NEEDED   meloxicam 15 MG tablet Commonly known as: MOBIC Take 1 tablet by mouth once daily   metoprolol tartrate 50 MG tablet Commonly known as: LOPRESSOR Take 50 mg by mouth 2 (two) times daily.   MULTIVITAMIN ADULT PO Take 2 tablets by mouth daily.   mupirocin ointment 2 % Commonly known as: BACTROBAN Apply topically 2 (two) times daily.   omeprazole 40 MG capsule Commonly known as: PRILOSEC SMARTSIG:1 Capsule(s) By Mouth Every Evening   simvastatin 40 MG tablet Commonly known as: ZOCOR Take 1 tablet by mouth once daily   Zinc 50 MG Tabs Take 1 tablet by mouth daily.        Allergies:  Allergies  Allergen Reactions   Tamsulosin Hcl Other (See Comments)    Pre-syncope Pre-syncope   Flagyl [Metronidazole] Nausea And Vomiting    States IV form causes to allergy   Lipitor [Atorvastatin] Other (See Comments)    Myalgia  Tramadol Other (See Comments)    Dizziness    Past Medical History, Surgical history, Social history, and Family History were reviewed and updated.  Review of Systems: Review of Systems  Constitutional: Negative.   HENT: Negative.    Eyes: Negative.   Respiratory: Negative.    Cardiovascular: Negative.   Gastrointestinal: Negative.   Genitourinary: Negative.   Musculoskeletal: Negative.   Skin: Negative.   Neurological: Negative.   Endo/Heme/Allergies: Negative.   Psychiatric/Behavioral: Negative.      Physical Exam:  oral temperature is 98.7 F (37.1 C). His blood pressure is 150/61 (abnormal) and his pulse is 59 (abnormal). His respiration is 16 and oxygen saturation is 99%.   Wt Readings from Last 3 Encounters:  05/17/21 192 lb (87.1 kg)  05/15/21 198 lb 6.6 oz  (90 kg)  03/03/21 198 lb 6.4 oz (90 kg)    Physical Exam Vitals reviewed.  HENT:     Head: Normocephalic and atraumatic.  Eyes:     Pupils: Pupils are equal, round, and reactive to light.  Cardiovascular:     Rate and Rhythm: Normal rate and regular rhythm.     Heart sounds: Normal heart sounds.  Pulmonary:     Effort: Pulmonary effort is normal.     Breath sounds: Normal breath sounds.  Abdominal:     General: Bowel sounds are normal.     Palpations: Abdomen is soft.  Musculoskeletal:        General: No tenderness or deformity. Normal range of motion.     Cervical back: Normal range of motion.  Lymphadenopathy:     Cervical: No cervical adenopathy.  Skin:    General: Skin is warm and dry.     Findings: No erythema or rash.  Neurological:     Mental Status: He is alert and oriented to person, place, and time.  Psychiatric:        Behavior: Behavior normal.        Thought Content: Thought content normal.        Judgment: Judgment normal.     Lab Results  Component Value Date   WBC 6.3 09/16/2021   HGB 12.4 (L) 09/16/2021   HCT 36.5 (L) 09/16/2021   MCV 90.3 09/16/2021   PLT 192 09/16/2021   No results found for: FERRITIN, IRON, TIBC, UIBC, IRONPCTSAT Lab Results  Component Value Date   RBC 4.04 (L) 09/16/2021   No results found for: KPAFRELGTCHN, LAMBDASER, KAPLAMBRATIO No results found for: IGGSERUM, IGA, IGMSERUM No results found for: Ronnald Ramp, A1GS, A2GS, Tillman Sers, SPEI   Chemistry      Component Value Date/Time   NA 137 05/15/2021 1346   K 4.2 05/15/2021 1346   CL 103 05/15/2021 1346   CO2 24 05/15/2021 1346   BUN 22 05/15/2021 1346   CREATININE 0.88 05/15/2021 1346   CREATININE 1.05 03/03/2021 1508   CREATININE 1.02 03/09/2018 0838      Component Value Date/Time   CALCIUM 8.6 (L) 05/15/2021 1346   ALKPHOS 45 03/03/2021 1508   AST 19 03/03/2021 1508   ALT 11 03/03/2021 1508   BILITOT 0.8 03/03/2021 1508        Impression and Plan: Mr. Gude is a very pleasant 80 yo [place] gentleman with history of B12 deficiency (diagnosed in 2019).   We will make sure he comes back in 3 months for his vitamin B12 injection.  I think this would be a reasonable follow-up amount of time to have the injections.  I will see him back in 6 months myself.   Volanda Napoleon, MD 9/27/202210:30 AM

## 2021-09-16 NOTE — Patient Instructions (Signed)
Vitamin B12 Deficiency Vitamin B12 deficiency means that your body does not have enough vitamin B12. The body needs this vitamin: To make red blood cells. To make genes (DNA). To help the nerves work. If you do not have enough vitamin B12 in your body, you can have health problems. What are the causes? Not eating enough foods that contain vitamin B12. Not being able to absorb vitamin B12 from the food that you eat. Certain digestive system diseases. A condition in which the body does not make enough of a certain protein, which results in too few red blood cells (pernicious anemia). Having a surgery in which part of the stomach or small intestine is removed. Taking medicines that make it hard for the body to absorb vitamin B12. These medicines include: Heartburn medicines. Some antibiotic medicines. Other medicines that are used to treat certain conditions. What increases the risk? Being older than age 50. Eating a vegetarian or vegan diet, especially while you are pregnant. Eating a poor diet while you are pregnant. Taking certain medicines. Having alcoholism. What are the signs or symptoms? In some cases, there are no symptoms. If the condition leads to too few blood cells or nerve damage, symptoms can occur, such as: Feeling weak. Feeling tired (fatigued). Not being hungry. Weight loss. A loss of feeling (numbness) or tingling in your hands and feet. Redness and burning of the tongue. Being mixed up (confused) or having memory problems. Sadness (depression). Problems with your senses. This can include color blindness, ringing in the ears, or loss of taste. Watery poop (diarrhea) or trouble pooping (constipation). Trouble walking. If anemia is very bad, symptoms can include: Being short of breath. Being dizzy. Having a very fast heartbeat. How is this treated? Changing the way you eat and drink, such as: Eating more foods that contain vitamin B12. Drinking little or no  alcohol. Getting vitamin B12 shots. Taking vitamin B12 supplements. Your doctor will tell you the dose that is best for you. Follow these instructions at home: Eating and drinking  Eat lots of healthy foods that contain vitamin B12. These include: Meats and poultry, such as beef, pork, chicken, turkey, and organ meats, such as liver. Seafood, such as clams, rainbow trout, salmon, tuna, and haddock. Eggs. Cereal and dairy products that have vitamin B12 added to them. Check the label. The items listed above may not be a complete list of what you can eat and drink. Contact a dietitian for more options. General instructions Get any shots as told by your doctor. Take supplements only as told by your doctor. Do not drink alcohol if your doctor tells you not to. In some cases, you may only be asked to limit alcohol use. Keep all follow-up visits as told by your doctor. This is important. Contact a doctor if: Your symptoms come back. Get help right away if: You have trouble breathing. You have a very fast heartbeat. You have chest pain. You get dizzy. You pass out. Summary Vitamin B12 deficiency means that your body is not getting enough vitamin B12. In some cases, there are no symptoms of this condition. Treatment may include making a change in the way you eat and drink, getting vitamin B12 shots, or taking supplements. Eat lots of healthy foods that contain vitamin B12. This information is not intended to replace advice given to you by your health care provider. Make sure you discuss any questions you have with your health care provider. Document Revised: 08/16/2018 Document Reviewed: 08/16/2018 Elsevier Patient Education    2022 Elsevier Inc.  

## 2021-09-30 ENCOUNTER — Other Ambulatory Visit: Payer: Self-pay | Admitting: Family Medicine

## 2021-10-19 ENCOUNTER — Other Ambulatory Visit: Payer: Self-pay | Admitting: Family Medicine

## 2021-10-19 ENCOUNTER — Other Ambulatory Visit: Payer: Self-pay | Admitting: Physician Assistant

## 2021-12-08 ENCOUNTER — Other Ambulatory Visit: Payer: Self-pay | Admitting: Physician Assistant

## 2021-12-16 ENCOUNTER — Inpatient Hospital Stay: Payer: Medicare HMO

## 2021-12-16 ENCOUNTER — Other Ambulatory Visit: Payer: Self-pay

## 2021-12-16 ENCOUNTER — Inpatient Hospital Stay: Payer: Medicare HMO | Attending: Hematology & Oncology

## 2021-12-16 VITALS — BP 161/58 | HR 55 | Temp 98.1°F | Resp 18

## 2021-12-16 DIAGNOSIS — E538 Deficiency of other specified B group vitamins: Secondary | ICD-10-CM | POA: Diagnosis present

## 2021-12-16 DIAGNOSIS — D510 Vitamin B12 deficiency anemia due to intrinsic factor deficiency: Secondary | ICD-10-CM

## 2021-12-16 LAB — VITAMIN B12: Vitamin B-12: 417 pg/mL (ref 180–914)

## 2021-12-16 LAB — CBC WITH DIFFERENTIAL (CANCER CENTER ONLY)
Abs Immature Granulocytes: 0.01 10*3/uL (ref 0.00–0.07)
Basophils Absolute: 0 10*3/uL (ref 0.0–0.1)
Basophils Relative: 1 %
Eosinophils Absolute: 0.2 10*3/uL (ref 0.0–0.5)
Eosinophils Relative: 3 %
HCT: 42.3 % (ref 39.0–52.0)
Hemoglobin: 13.9 g/dL (ref 13.0–17.0)
Immature Granulocytes: 0 %
Lymphocytes Relative: 23 %
Lymphs Abs: 1.5 10*3/uL (ref 0.7–4.0)
MCH: 30.5 pg (ref 26.0–34.0)
MCHC: 32.9 g/dL (ref 30.0–36.0)
MCV: 93 fL (ref 80.0–100.0)
Monocytes Absolute: 0.6 10*3/uL (ref 0.1–1.0)
Monocytes Relative: 10 %
Neutro Abs: 4.1 10*3/uL (ref 1.7–7.7)
Neutrophils Relative %: 63 %
Platelet Count: 161 10*3/uL (ref 150–400)
RBC: 4.55 MIL/uL (ref 4.22–5.81)
RDW: 12.6 % (ref 11.5–15.5)
WBC Count: 6.4 10*3/uL (ref 4.0–10.5)
nRBC: 0 % (ref 0.0–0.2)

## 2021-12-16 MED ORDER — CYANOCOBALAMIN 1000 MCG/ML IJ SOLN
1000.0000 ug | Freq: Once | INTRAMUSCULAR | Status: AC
  Administered 2021-12-16: 11:00:00 1000 ug via INTRAMUSCULAR
  Filled 2021-12-16: qty 1

## 2021-12-16 NOTE — Patient Instructions (Signed)
Vitamin B12 Injection °What is this medication? °Vitamin B12 (VAHY tuh min B12) prevents and treats low vitamin B12 levels in your body. It is used in people who do not get enough vitamin B12 from their diet or when their digestive tract does not absorb enough. Vitamin B12 plays an important role in maintaining the health of your nervous system and red blood cells. °This medicine may be used for other purposes; ask your health care provider or pharmacist if you have questions. °COMMON BRAND NAME(S): B-12 Compliance Kit, B-12 Injection Kit, Cyomin, Dodex, LA-12, Nutri-Twelve, Physicians EZ Use B-12, Primabalt °What should I tell my care team before I take this medication? °They need to know if you have any of these conditions: °Kidney disease °Leber's disease °Megaloblastic anemia °An unusual or allergic reaction to cyanocobalamin, cobalt, other medications, foods, dyes, or preservatives °Pregnant or trying to get pregnant °Breast-feeding °How should I use this medication? °This medication is injected into a muscle or deeply under the skin. It is usually given in a clinic or care team's office. However, your care team may teach you how to inject yourself. Follow all instructions. °Talk to your care team about the use of this medication in children. Special care may be needed. °Overdosage: If you think you have taken too much of this medicine contact a poison control center or emergency room at once. °NOTE: This medicine is only for you. Do not share this medicine with others. °What if I miss a dose? °If you are given your dose at a clinic or care team's office, call to reschedule your appointment. If you give your own injections, and you miss a dose, take it as soon as you can. If it is almost time for your next dose, take only that dose. Do not take double or extra doses. °What may interact with this medication? °Colchicine °Heavy alcohol intake °This list may not describe all possible interactions. Give your health  care provider a list of all the medicines, herbs, non-prescription drugs, or dietary supplements you use. Also tell them if you smoke, drink alcohol, or use illegal drugs. Some items may interact with your medicine. °What should I watch for while using this medication? °Visit your care team regularly. You may need blood work done while you are taking this medication. °You may need to follow a special diet. Talk to your care team. Limit your alcohol intake and avoid smoking to get the best benefit. °What side effects may I notice from receiving this medication? °Side effects that you should report to your care team as soon as possible: °Allergic reactions--skin rash, itching, hives, swelling of the face, lips, tongue, or throat °Swelling of the ankles, hands, or feet °Trouble breathing °Side effects that usually do not require medical attention (report to your care team if they continue or are bothersome): °Diarrhea °This list may not describe all possible side effects. Call your doctor for medical advice about side effects. You may report side effects to FDA at 1-800-FDA-1088. °Where should I keep my medication? °Keep out of the reach of children. °Store at room temperature between 15 and 30 degrees C (59 and 85 degrees F). Protect from light. Throw away any unused medication after the expiration date. °NOTE: This sheet is a summary. It may not cover all possible information. If you have questions about this medicine, talk to your doctor, pharmacist, or health care provider. °© 2022 Elsevier/Gold Standard (2021-02-19 00:00:00) ° °

## 2022-01-23 ENCOUNTER — Other Ambulatory Visit: Payer: Self-pay | Admitting: Physician Assistant

## 2022-03-16 ENCOUNTER — Inpatient Hospital Stay: Payer: Medicare HMO

## 2022-03-16 ENCOUNTER — Other Ambulatory Visit: Payer: Self-pay

## 2022-03-16 ENCOUNTER — Inpatient Hospital Stay: Payer: Medicare HMO | Attending: Hematology & Oncology | Admitting: Hematology & Oncology

## 2022-03-16 ENCOUNTER — Encounter: Payer: Self-pay | Admitting: Hematology & Oncology

## 2022-03-16 VITALS — BP 123/60 | HR 55 | Temp 97.8°F | Resp 18 | Ht 70.87 in | Wt 194.0 lb

## 2022-03-16 DIAGNOSIS — Z79899 Other long term (current) drug therapy: Secondary | ICD-10-CM | POA: Insufficient documentation

## 2022-03-16 DIAGNOSIS — I420 Dilated cardiomyopathy: Secondary | ICD-10-CM

## 2022-03-16 DIAGNOSIS — D510 Vitamin B12 deficiency anemia due to intrinsic factor deficiency: Secondary | ICD-10-CM

## 2022-03-16 DIAGNOSIS — E538 Deficiency of other specified B group vitamins: Secondary | ICD-10-CM | POA: Diagnosis present

## 2022-03-16 DIAGNOSIS — R5383 Other fatigue: Secondary | ICD-10-CM | POA: Insufficient documentation

## 2022-03-16 DIAGNOSIS — I429 Cardiomyopathy, unspecified: Secondary | ICD-10-CM | POA: Insufficient documentation

## 2022-03-16 LAB — CBC WITH DIFFERENTIAL (CANCER CENTER ONLY)
Abs Immature Granulocytes: 0.04 10*3/uL (ref 0.00–0.07)
Basophils Absolute: 0 10*3/uL (ref 0.0–0.1)
Basophils Relative: 0 %
Eosinophils Absolute: 0 10*3/uL (ref 0.0–0.5)
Eosinophils Relative: 0 %
HCT: 42.4 % (ref 39.0–52.0)
Hemoglobin: 14.1 g/dL (ref 13.0–17.0)
Immature Granulocytes: 0 %
Lymphocytes Relative: 15 %
Lymphs Abs: 1.6 10*3/uL (ref 0.7–4.0)
MCH: 30.2 pg (ref 26.0–34.0)
MCHC: 33.3 g/dL (ref 30.0–36.0)
MCV: 90.8 fL (ref 80.0–100.0)
Monocytes Absolute: 0.8 10*3/uL (ref 0.1–1.0)
Monocytes Relative: 8 %
Neutro Abs: 8 10*3/uL — ABNORMAL HIGH (ref 1.7–7.7)
Neutrophils Relative %: 77 %
Platelet Count: 221 10*3/uL (ref 150–400)
RBC: 4.67 MIL/uL (ref 4.22–5.81)
RDW: 13.6 % (ref 11.5–15.5)
WBC Count: 10.5 10*3/uL (ref 4.0–10.5)
nRBC: 0 % (ref 0.0–0.2)

## 2022-03-16 LAB — LIPID PANEL
Cholesterol: 237 mg/dL — ABNORMAL HIGH (ref 0–200)
HDL: 43 mg/dL (ref >40)
LDL Cholesterol: 172 mg/dL — ABNORMAL HIGH (ref 0–99)
Total CHOL/HDL Ratio: 5.5 RATIO
Triglycerides: 110 mg/dL (ref <150)
VLDL: 22 mg/dL (ref 0–40)

## 2022-03-16 LAB — CMP (CANCER CENTER ONLY)
ALT: 16 U/L (ref 0–44)
AST: 18 U/L (ref 15–41)
Albumin: 4.4 g/dL (ref 3.5–5.0)
Alkaline Phosphatase: 37 U/L — ABNORMAL LOW (ref 38–126)
Anion gap: 5 (ref 5–15)
BUN: 24 mg/dL — ABNORMAL HIGH (ref 8–23)
CO2: 28 mmol/L (ref 22–32)
Calcium: 9.6 mg/dL (ref 8.9–10.3)
Chloride: 105 mmol/L (ref 98–111)
Creatinine: 1.07 mg/dL (ref 0.61–1.24)
GFR, Estimated: >60 mL/min (ref >60)
Glucose, Bld: 114 mg/dL — ABNORMAL HIGH (ref 70–99)
Potassium: 4.5 mmol/L (ref 3.5–5.1)
Sodium: 138 mmol/L (ref 135–145)
Total Bilirubin: 0.9 mg/dL (ref 0.3–1.2)
Total Protein: 6.7 g/dL (ref 6.5–8.1)

## 2022-03-16 LAB — VITAMIN B12: Vitamin B-12: 302 pg/mL (ref 180–914)

## 2022-03-16 MED ORDER — CYANOCOBALAMIN 1000 MCG/ML IJ SOLN
1000.0000 ug | Freq: Once | INTRAMUSCULAR | Status: AC
  Administered 2022-03-16: 1000 ug via INTRAMUSCULAR
  Filled 2022-03-16: qty 1

## 2022-03-16 NOTE — Progress Notes (Signed)
?Hematology and Oncology Follow Up Visit ? ?Beatriz Chancellor ?[id number] ?January 16, 1941 81 y.o. ?03/16/2022 ? ? ?Principle Diagnosis:  ?B 12 deficiency  ? ?Current Therapy:   ?Vitamin B12 injection -- q 3 months ?  ?Interim History:  Mr. Sawyer is here today for follow-up.  So far, everything is going pretty well for him.  We saw him 6 months ago.  He and his wife will be going down to [place] in a couple weeks.  I think her daughter lives down there. ? ?When we last had his B12 level checked in December, his B12 level was 417. ? ?He has had no problems with COVID.  He has had no cough or shortness of breath. ? ?He has a cardiomyopathy.  He has been quite tired.  He has been quite fatigued.  He is on Entresto.  Is hard to say if this is really working for him.  I know the cardiologist are working hard to try to help his cardiac function. ? ?He has had no change in bowel or bladder habits.  He has had no leg swelling.  He has had no rashes. ? ?Overall, his performance status is ECOG 1.   ? ?Medications:  ?Allergies as of 03/16/2022   ? ?   Reactions  ? Tamsulosin Hcl Other (See Comments)  ? Pre-syncope  ? Flagyl [metronidazole] Nausea And Vomiting  ? States IV form causes to allergy  ? Lipitor [atorvastatin] Other (See Comments)  ? Myalgia  ? Tramadol Other (See Comments)  ? Dizziness  ? ?  ? ?  ?Medication List  ?  ? ?  ? Accurate as of March 16, 2022 11:57 AM. If you have any questions, ask your nurse or doctor.  ?  ?  ? ?  ? ?STOP taking these medications   ? ?lisinopril 10 MG tablet ?Commonly known as: ZESTRIL ?Stopped by: Volanda Napoleon, MD ?  ?mupirocin ointment 2 % ?Commonly known as: BACTROBAN ?Stopped by: Volanda Napoleon, MD ?  ? ?  ? ?TAKE these medications   ? ?apixaban 5 MG Tabs tablet ?Commonly known as: ELIQUIS ?Take 5 mg by mouth 2 (two) times daily. ?  ?ciprofloxacin-dexamethasone OTIC suspension ?Commonly known as: CIPRODEX ?SMARTSIG:In Ear(s) ?  ?Euthyrox 75 MCG tablet ?Generic drug: levothyroxine ?TAKE  1 TABLET BY MOUTH ONCE DAILY IN THE MORNING ON AN EMPTY STOMACH. NEEDS TO MAKE AN APPOINTMENT ?  ?hydrocortisone 2.5 % cream ?Apply topically. ?  ?meclizine 25 MG tablet ?Commonly known as: ANTIVERT ?TAKE 1 TABLET BY MOUTH AS NEEDED ?  ?meloxicam 15 MG tablet ?Commonly known as: MOBIC ?Take 1 tablet by mouth once daily ?  ?metoprolol tartrate 50 MG tablet ?Commonly known as: LOPRESSOR ?Take 50 mg by mouth 2 (two) times daily. ?  ?MULTIVITAMIN ADULT PO ?Take 2 tablets by mouth daily. ?  ?omeprazole 40 MG capsule ?Commonly known as: PRILOSEC ?SMARTSIG:1 Capsule(s) By Mouth Every Evening ?  ?simvastatin 40 MG tablet ?Commonly known as: ZOCOR ?Take 1 tablet by mouth once daily ?  ? ?  ? ? ?Allergies:  ?Allergies  ?Allergen Reactions  ? Tamsulosin Hcl Other (See Comments)  ?  Pre-syncope ?  ? Flagyl [Metronidazole] Nausea And Vomiting  ?  States IV form causes to allergy  ? Lipitor [Atorvastatin] Other (See Comments)  ?  Myalgia  ? Tramadol Other (See Comments)  ?  Dizziness  ? ? ?Past Medical History, Surgical history, Social history, and Family History were reviewed and updated. ? ?Review  of Systems: ?Review of Systems  ?Constitutional: Negative.   ?HENT: Negative.    ?Eyes: Negative.   ?Respiratory: Negative.    ?Cardiovascular: Negative.   ?Gastrointestinal: Negative.   ?Genitourinary: Negative.   ?Musculoskeletal: Negative.   ?Skin: Negative.   ?Neurological: Negative.   ?Endo/Heme/Allergies: Negative.   ?Psychiatric/Behavioral: Negative.    ? ? ?Physical Exam: ? height is 5' 10.87" (1.8 m) and weight is 194 lb (88 kg). His oral temperature is 97.8 ?F (36.6 ?C). His blood pressure is 123/60 and his pulse is 55 (abnormal). His respiration is 18 and oxygen saturation is 100%.  ? ?Wt Readings from Last 3 Encounters:  ?03/16/22 194 lb (88 kg)  ?05/17/21 192 lb (87.1 kg)  ?05/15/21 198 lb 6.6 oz (90 kg)  ? ? ?Physical Exam ?Vitals reviewed.  ?HENT:  ?   Head: Normocephalic and atraumatic.  ?Eyes:  ?   Pupils: Pupils are  equal, round, and reactive to light.  ?Cardiovascular:  ?   Rate and Rhythm: Normal rate and regular rhythm.  ?   Heart sounds: Normal heart sounds.  ?Pulmonary:  ?   Effort: Pulmonary effort is normal.  ?   Breath sounds: Normal breath sounds.  ?Abdominal:  ?   General: Bowel sounds are normal.  ?   Palpations: Abdomen is soft.  ?Musculoskeletal:     ?   General: No tenderness or deformity. Normal range of motion.  ?   Cervical back: Normal range of motion.  ?Lymphadenopathy:  ?   Cervical: No cervical adenopathy.  ?Skin: ?   General: Skin is warm and dry.  ?   Findings: No erythema or rash.  ?Neurological:  ?   Mental Status: He is alert and oriented to person, place, and time.  ?Psychiatric:     ?   Behavior: Behavior normal.     ?   Thought Content: Thought content normal.     ?   Judgment: Judgment normal.  ? ? ? ?Lab Results  ?Component Value Date  ? WBC 10.5 03/16/2022  ? HGB 14.1 03/16/2022  ? HCT 42.4 03/16/2022  ? MCV 90.8 03/16/2022  ? PLT 221 03/16/2022  ? ?No results found for: FERRITIN, IRON, TIBC, UIBC, IRONPCTSAT ?Lab Results  ?Component Value Date  ? RBC 4.67 03/16/2022  ? ?No results found for: KPAFRELGTCHN, LAMBDASER, KAPLAMBRATIO ?No results found for: IGGSERUM, IGA, IGMSERUM ?No results found for: TOTALPROTELP, ALBUMINELP, A1GS, A2GS, BETS, BETA2SER, GAMS, MSPIKE, SPEI ?  Chemistry   ?   ?Component Value Date/Time  ? NA 138 03/16/2022 1018  ? K 4.5 03/16/2022 1018  ? CL 105 03/16/2022 1018  ? CO2 28 03/16/2022 1018  ? BUN 24 (H) 03/16/2022 1018  ? CREATININE 1.07 03/16/2022 1018  ? CREATININE 1.02 03/09/2018 0838  ?    ?Component Value Date/Time  ? CALCIUM 9.6 03/16/2022 1018  ? ALKPHOS 37 (L) 03/16/2022 1018  ? AST 18 03/16/2022 1018  ? ALT 16 03/16/2022 1018  ? BILITOT 0.9 03/16/2022 1018  ?  ? ? ? ?Impression and Plan: Mr. Oravec is a very pleasant 81 yo [place] gentleman with history of B12 deficiency (diagnosed in 2019).  ? ?So far, everything is going quite well.  He is doing well on the  B12 injections every 3 months.  I think this is a good interval for [place]. ? ?I would still like to see him back in 6 months.  This way, we will get him through the summertime.  I am sure  that he will have a good trip down to [place]. ? ? ?Volanda Napoleon, MD ?3/27/202311:57 AM ?

## 2022-03-16 NOTE — Patient Instructions (Signed)
Vitamin B12 Injection ?What is this medication? ?Vitamin B12 (VAHY tuh min B12) prevents and treats low vitamin B12 levels in your body. It is used in people who do not get enough vitamin B12 from their diet or when their digestive tract does not absorb enough. Vitamin B12 plays an important role in maintaining the health of your nervous system and red blood cells. ?This medicine may be used for other purposes; ask your health care provider or pharmacist if you have questions. ?COMMON BRAND NAME(S): B-12 Compliance Kit, B-12 Injection Kit, Cyomin, Dodex, LA-12, Nutri-Twelve, Physicians EZ Use B-12, Primabalt ?What should I tell my care team before I take this medication? ?They need to know if you have any of these conditions: ?Kidney disease ?Leber's disease ?Megaloblastic anemia ?An unusual or allergic reaction to cyanocobalamin, cobalt, other medications, foods, dyes, or preservatives ?Pregnant or trying to get pregnant ?Breast-feeding ?How should I use this medication? ?This medication is injected into a muscle or deeply under the skin. It is usually given in a clinic or care team's office. However, your care team may teach you how to inject yourself. Follow all instructions. ?Talk to your care team about the use of this medication in children. Special care may be needed. ?Overdosage: If you think you have taken too much of this medicine contact a poison control center or emergency room at once. ?NOTE: This medicine is only for you. Do not share this medicine with others. ?What if I miss a dose? ?If you are given your dose at a clinic or care team's office, call to reschedule your appointment. If you give your own injections, and you miss a dose, take it as soon as you can. If it is almost time for your next dose, take only that dose. Do not take double or extra doses. ?What may interact with this medication? ?Colchicine ?Heavy alcohol intake ?This list may not describe all possible interactions. Give your health  care provider a list of all the medicines, herbs, non-prescription drugs, or dietary supplements you use. Also tell them if you smoke, drink alcohol, or use illegal drugs. Some items may interact with your medicine. ?What should I watch for while using this medication? ?Visit your care team regularly. You may need blood work done while you are taking this medication. ?You may need to follow a special diet. Talk to your care team. Limit your alcohol intake and avoid smoking to get the best benefit. ?What side effects may I notice from receiving this medication? ?Side effects that you should report to your care team as soon as possible: ?Allergic reactions--skin rash, itching, hives, swelling of the face, lips, tongue, or throat ?Swelling of the ankles, hands, or feet ?Trouble breathing ?Side effects that usually do not require medical attention (report to your care team if they continue or are bothersome): ?Diarrhea ?This list may not describe all possible side effects. Call your doctor for medical advice about side effects. You may report side effects to FDA at 1-800-FDA-1088. ?Where should I keep my medication? ?Keep out of the reach of children. ?Store at room temperature between 15 and 30 degrees C (59 and 85 degrees F). Protect from light. Throw away any unused medication after the expiration date. ?NOTE: This sheet is a summary. It may not cover all possible information. If you have questions about this medicine, talk to your doctor, pharmacist, or health care provider. ?? 2022 Elsevier/Gold Standard (2021-02-19 00:00:00) ? ?

## 2022-03-17 ENCOUNTER — Encounter: Payer: Self-pay | Admitting: *Deleted

## 2022-04-26 ENCOUNTER — Other Ambulatory Visit: Payer: Self-pay | Admitting: Physician Assistant

## 2022-04-26 ENCOUNTER — Other Ambulatory Visit: Payer: Self-pay | Admitting: Family Medicine

## 2022-05-06 ENCOUNTER — Other Ambulatory Visit: Payer: Self-pay | Admitting: Physician Assistant

## 2022-06-16 ENCOUNTER — Inpatient Hospital Stay: Payer: Medicare HMO

## 2022-06-16 ENCOUNTER — Inpatient Hospital Stay: Payer: Medicare HMO | Attending: Hematology & Oncology

## 2022-06-16 VITALS — BP 130/58 | HR 62 | Temp 97.8°F | Resp 18

## 2022-06-16 DIAGNOSIS — D510 Vitamin B12 deficiency anemia due to intrinsic factor deficiency: Secondary | ICD-10-CM

## 2022-06-16 DIAGNOSIS — E538 Deficiency of other specified B group vitamins: Secondary | ICD-10-CM | POA: Diagnosis present

## 2022-06-16 LAB — CMP (CANCER CENTER ONLY)
ALT: 19 U/L (ref 0–44)
AST: 20 U/L (ref 15–41)
Albumin: 4.4 g/dL (ref 3.5–5.0)
Alkaline Phosphatase: 43 U/L (ref 38–126)
Anion gap: 7 (ref 5–15)
BUN: 14 mg/dL (ref 8–23)
CO2: 27 mmol/L (ref 22–32)
Calcium: 9.5 mg/dL (ref 8.9–10.3)
Chloride: 107 mmol/L (ref 98–111)
Creatinine: 0.97 mg/dL (ref 0.61–1.24)
GFR, Estimated: >60 mL/min (ref >60)
Glucose, Bld: 105 mg/dL — ABNORMAL HIGH (ref 70–99)
Potassium: 4.3 mmol/L (ref 3.5–5.1)
Sodium: 141 mmol/L (ref 135–145)
Total Bilirubin: 0.7 mg/dL (ref 0.3–1.2)
Total Protein: 6.7 g/dL (ref 6.5–8.1)

## 2022-06-16 LAB — CBC WITH DIFFERENTIAL (CANCER CENTER ONLY)
Abs Immature Granulocytes: 0.03 10*3/uL (ref 0.00–0.07)
Basophils Absolute: 0.1 10*3/uL (ref 0.0–0.1)
Basophils Relative: 1 %
Eosinophils Absolute: 0.1 10*3/uL (ref 0.0–0.5)
Eosinophils Relative: 1 %
HCT: 38.7 % — ABNORMAL LOW (ref 39.0–52.0)
Hemoglobin: 12.8 g/dL — ABNORMAL LOW (ref 13.0–17.0)
Immature Granulocytes: 0 %
Lymphocytes Relative: 17 %
Lymphs Abs: 1.3 10*3/uL (ref 0.7–4.0)
MCH: 30.9 pg (ref 26.0–34.0)
MCHC: 33.1 g/dL (ref 30.0–36.0)
MCV: 93.5 fL (ref 80.0–100.0)
Monocytes Absolute: 0.6 10*3/uL (ref 0.1–1.0)
Monocytes Relative: 8 %
Neutro Abs: 5.5 10*3/uL (ref 1.7–7.7)
Neutrophils Relative %: 73 %
Platelet Count: 200 10*3/uL (ref 150–400)
RBC: 4.14 MIL/uL — ABNORMAL LOW (ref 4.22–5.81)
RDW: 13.2 % (ref 11.5–15.5)
WBC Count: 7.6 10*3/uL (ref 4.0–10.5)
nRBC: 0 % (ref 0.0–0.2)

## 2022-06-16 LAB — LACTATE DEHYDROGENASE: LDH: 179 U/L (ref 98–192)

## 2022-06-16 LAB — VITAMIN B12: Vitamin B-12: 346 pg/mL (ref 180–914)

## 2022-06-16 MED ORDER — CYANOCOBALAMIN 1000 MCG/ML IJ SOLN
1000.0000 ug | Freq: Once | INTRAMUSCULAR | Status: AC
  Administered 2022-06-16: 1000 ug via INTRAMUSCULAR
  Filled 2022-06-16: qty 1

## 2022-07-20 ENCOUNTER — Other Ambulatory Visit: Payer: Self-pay | Admitting: Physician Assistant

## 2022-09-16 ENCOUNTER — Other Ambulatory Visit: Payer: Self-pay | Admitting: *Deleted

## 2022-09-16 DIAGNOSIS — E538 Deficiency of other specified B group vitamins: Secondary | ICD-10-CM

## 2022-09-16 DIAGNOSIS — D510 Vitamin B12 deficiency anemia due to intrinsic factor deficiency: Secondary | ICD-10-CM

## 2022-09-17 ENCOUNTER — Inpatient Hospital Stay: Payer: Medicare HMO

## 2022-09-17 ENCOUNTER — Inpatient Hospital Stay: Payer: Medicare HMO | Attending: Hematology & Oncology

## 2022-09-17 ENCOUNTER — Encounter: Payer: Self-pay | Admitting: Hematology & Oncology

## 2022-09-17 ENCOUNTER — Other Ambulatory Visit: Payer: Self-pay

## 2022-09-17 ENCOUNTER — Inpatient Hospital Stay: Payer: Medicare HMO | Admitting: Hematology & Oncology

## 2022-09-17 VITALS — BP 163/53 | HR 68 | Temp 97.9°F | Resp 18 | Ht 70.0 in | Wt 199.0 lb

## 2022-09-17 DIAGNOSIS — D510 Vitamin B12 deficiency anemia due to intrinsic factor deficiency: Secondary | ICD-10-CM

## 2022-09-17 DIAGNOSIS — E538 Deficiency of other specified B group vitamins: Secondary | ICD-10-CM | POA: Diagnosis present

## 2022-09-17 LAB — CBC WITH DIFFERENTIAL (CANCER CENTER ONLY)
Abs Immature Granulocytes: 0.28 10*3/uL — ABNORMAL HIGH (ref 0.00–0.07)
Basophils Absolute: 0 10*3/uL (ref 0.0–0.1)
Basophils Relative: 0 %
Eosinophils Absolute: 0.1 10*3/uL (ref 0.0–0.5)
Eosinophils Relative: 0 %
HCT: 39 % (ref 39.0–52.0)
Hemoglobin: 12.8 g/dL — ABNORMAL LOW (ref 13.0–17.0)
Immature Granulocytes: 2 %
Lymphocytes Relative: 10 %
Lymphs Abs: 1.6 10*3/uL (ref 0.7–4.0)
MCH: 29.6 pg (ref 26.0–34.0)
MCHC: 32.8 g/dL (ref 30.0–36.0)
MCV: 90.3 fL (ref 80.0–100.0)
Monocytes Absolute: 1.1 10*3/uL — ABNORMAL HIGH (ref 0.1–1.0)
Monocytes Relative: 7 %
Neutro Abs: 12.7 10*3/uL — ABNORMAL HIGH (ref 1.7–7.7)
Neutrophils Relative %: 81 %
Platelet Count: 205 10*3/uL (ref 150–400)
RBC: 4.32 MIL/uL (ref 4.22–5.81)
RDW: 13.4 % (ref 11.5–15.5)
WBC Count: 15.8 10*3/uL — ABNORMAL HIGH (ref 4.0–10.5)
nRBC: 0 % (ref 0.0–0.2)

## 2022-09-17 LAB — CMP (CANCER CENTER ONLY)
ALT: 13 U/L (ref 0–44)
AST: 19 U/L (ref 15–41)
Albumin: 4.2 g/dL (ref 3.5–5.0)
Alkaline Phosphatase: 43 U/L (ref 38–126)
Anion gap: 8 (ref 5–15)
BUN: 28 mg/dL — ABNORMAL HIGH (ref 8–23)
CO2: 27 mmol/L (ref 22–32)
Calcium: 9.5 mg/dL (ref 8.9–10.3)
Chloride: 106 mmol/L (ref 98–111)
Creatinine: 1.11 mg/dL (ref 0.61–1.24)
GFR, Estimated: >60 mL/min (ref >60)
Glucose, Bld: 121 mg/dL — ABNORMAL HIGH (ref 70–99)
Potassium: 4.6 mmol/L (ref 3.5–5.1)
Sodium: 141 mmol/L (ref 135–145)
Total Bilirubin: 0.5 mg/dL (ref 0.3–1.2)
Total Protein: 6.5 g/dL (ref 6.5–8.1)

## 2022-09-17 LAB — VITAMIN B12: Vitamin B-12: 334 pg/mL (ref 180–914)

## 2022-09-17 LAB — LACTATE DEHYDROGENASE: LDH: 211 U/L — ABNORMAL HIGH (ref 98–192)

## 2022-09-17 MED ORDER — CYANOCOBALAMIN 1000 MCG/ML IJ SOLN
1000.0000 ug | Freq: Once | INTRAMUSCULAR | Status: AC
  Administered 2022-09-17: 1000 ug via INTRAMUSCULAR
  Filled 2022-09-17: qty 1

## 2022-09-17 NOTE — Progress Notes (Signed)
Hematology and Oncology Follow Up Visit  [name] [id number] 1941-09-10 81 y.o. 09/17/2022   Principle Diagnosis:  B 12 deficiency   Current Therapy:   Vitamin B12 injection -- q 3 months   Interim History:  [name] is here today for follow-up.  We saw him 6 months ago.  He has been doing quite nicely.  He really has had no complaints.  Currently, he does have a respiratory issue.  He has little bit of congestion.  He is on some steroids.  He had antibiotics.  I think he was put on a prednisone Dosepak.  I think he had a cortisone injection.  He has had no fever.  His appetite's been good.  He has had no diarrhea.  He has had no nausea or vomiting.  He has had no rashes.  There is been no leg swelling.  His last vitamin B12 level was 346 back in June.  He has had no issues with COVID.  Overall, I would say his performance status is probably ECOG 1.  Medications:  Allergies as of 09/17/2022       Reactions   Tamsulosin Hcl Other (See Comments)   Pre-syncope   Flagyl [metronidazole] Nausea And Vomiting   States IV form causes to allergy   Lipitor [atorvastatin] Other (See Comments)   Myalgia   Tramadol Other (See Comments)   Dizziness        Medication List        Accurate as of September 17, 2022 10:20 AM. If you have any questions, ask your nurse or doctor.          apixaban 5 MG Tabs tablet Commonly known as: ELIQUIS Take 5 mg by mouth 2 (two) times daily.   ciprofloxacin-dexamethasone OTIC suspension Commonly known as: CIPRODEX SMARTSIG:In Ear(s)   Euthyrox 75 MCG tablet Generic drug: levothyroxine TAKE 1 TABLET BY MOUTH ONCE DAILY IN THE MORNING ON AN EMPTY STOMACH. NEEDS TO MAKE AN APPOINTMENT   hydrocortisone 2.5 % cream Apply topically.   meclizine 25 MG tablet Commonly known as: ANTIVERT TAKE 1 TABLET BY MOUTH AS NEEDED   meloxicam 15 MG tablet Commonly known as: MOBIC Take 1 tablet by mouth once daily   metoprolol tartrate  50 MG tablet Commonly known as: LOPRESSOR Take 50 mg by mouth 2 (two) times daily.   MULTIVITAMIN ADULT PO Take 2 tablets by mouth daily.   omeprazole 40 MG capsule Commonly known as: PRILOSEC SMARTSIG:1 Capsule(s) By Mouth Every Evening   simvastatin 40 MG tablet Commonly known as: ZOCOR Take 1 tablet by mouth once daily        Allergies:  Allergies  Allergen Reactions   Tamsulosin Hcl Other (See Comments)    Pre-syncope    Flagyl [Metronidazole] Nausea And Vomiting    States IV form causes to allergy   Lipitor [Atorvastatin] Other (See Comments)    Myalgia   Tramadol Other (See Comments)    Dizziness    Past Medical History, Surgical history, Social history, and Family History were reviewed and updated.  Review of Systems: Review of Systems  Constitutional: Negative.   HENT: Negative.    Eyes: Negative.   Respiratory: Negative.    Cardiovascular: Negative.   Gastrointestinal: Negative.   Genitourinary: Negative.   Musculoskeletal: Negative.   Skin: Negative.   Neurological: Negative.   Endo/Heme/Allergies: Negative.   Psychiatric/Behavioral: Negative.       Physical Exam:  height is '5\' 10"'$  (1.778 m) and weight is 199  lb (90.3 kg). His oral temperature is 97.9 F (36.6 C). His blood pressure is 163/53 (abnormal) and his pulse is 68. His respiration is 18 and oxygen saturation is 100%.   Wt Readings from Last 3 Encounters:  09/17/22 199 lb (90.3 kg)  03/16/22 194 lb (88 kg)  05/17/21 192 lb (87.1 kg)    Physical Exam Vitals reviewed.  HENT:     Head: Normocephalic and atraumatic.  Eyes:     Pupils: Pupils are equal, round, and reactive to light.  Cardiovascular:     Rate and Rhythm: Normal rate and regular rhythm.     Heart sounds: Normal heart sounds.  Pulmonary:     Effort: Pulmonary effort is normal.     Breath sounds: Normal breath sounds.  Abdominal:     General: Bowel sounds are normal.     Palpations: Abdomen is soft.   Musculoskeletal:        General: No tenderness or deformity. Normal range of motion.     Cervical back: Normal range of motion.  Lymphadenopathy:     Cervical: No cervical adenopathy.  Skin:    General: Skin is warm and dry.     Findings: No erythema or rash.  Neurological:     Mental Status: He is alert and oriented to person, place, and time.  Psychiatric:        Behavior: Behavior normal.        Thought Content: Thought content normal.        Judgment: Judgment normal.     Lab Results  Component Value Date   WBC 15.8 (H) 09/17/2022   HGB 12.8 (L) 09/17/2022   HCT 39.0 09/17/2022   MCV 90.3 09/17/2022   PLT 205 09/17/2022   No results found for: "FERRITIN", "IRON", "TIBC", "UIBC", "IRONPCTSAT" Lab Results  Component Value Date   RBC 4.32 09/17/2022   No results found for: "KPAFRELGTCHN", "LAMBDASER", "KAPLAMBRATIO" No results found for: "IGGSERUM", "IGA", "IGMSERUM" No results found for: "TOTALPROTELP", "ALBUMINELP", "A1GS", "A2GS", "BETS", "BETA2SER", "GAMS", "MSPIKE", "SPEI"   Chemistry      Component Value Date/Time   NA 141 06/16/2022 1112   K 4.3 06/16/2022 1112   CL 107 06/16/2022 1112   CO2 27 06/16/2022 1112   BUN 14 06/16/2022 1112   CREATININE 0.97 06/16/2022 1112   CREATININE 1.02 03/09/2018 0838      Component Value Date/Time   CALCIUM 9.5 06/16/2022 1112   ALKPHOS 43 06/16/2022 1112   AST 20 06/16/2022 1112   ALT 19 06/16/2022 1112   BILITOT 0.7 06/16/2022 1112       Impression and Plan: [name] is a very pleasant 81 yo [place] gentleman with history of B12 deficiency (diagnosed in 2019).   His levels have been good with his vitamin B12.  We do the vitamin B12 every 3 months.  I think this is a good interval for him.  Hopefully, he will be able to get over this congestion that he has.  His white cell count is little bit elevated because of the steroids that he has been taking.  We will still manage to get him back in 6 months.  We will  have him come back in 3 months for his B12 injection.   Volanda Napoleon, MD 9/28/202310:20 AM

## 2022-09-17 NOTE — Patient Instructions (Signed)
Vitamin B12 Deficiency Vitamin B12 deficiency occurs when the body does not have enough of this important vitamin. The body needs this vitamin: To make red blood cells. To make DNA. This is the genetic material inside cells. To help the nerves work properly so they can carry messages from the brain to the body. Vitamin B12 deficiency can cause health problems, such as not having enough red blood cells in the blood (anemia). This can lead to nerve damage if untreated. What are the causes? This condition may be caused by: Not eating enough foods that contain vitamin B12. Not having enough stomach acid and digestive fluids to properly absorb vitamin B12 from the food that you eat. Having certain diseases that make it hard to absorb vitamin B12. These diseases include Crohn's disease, chronic pancreatitis, and cystic fibrosis. An autoimmune disorder in which the body does not make enough of a protein (intrinsic factor) within the stomach, resulting in not enough absorption of vitamin B12. Having a surgery in which part of the stomach or small intestine is removed. Taking certain medicines that make it hard for the body to absorb vitamin B12. These include: Heartburn medicines, such as antacids and proton pump inhibitors. Some medicines that are used to treat diabetes. What increases the risk? The following factors may make you more likely to develop a vitamin B12 deficiency: Being an older adult. Eating a vegetarian or vegan diet that does not include any foods that come from animals. Eating a poor diet while you are pregnant. Taking certain medicines. Having alcoholism. What are the signs or symptoms? In some cases, there are no symptoms of this condition. If the condition leads to anemia or nerve damage, various symptoms may occur, such as: Weakness. Tiredness (fatigue). Loss of appetite. Numbness or tingling in your hands and feet. Redness and burning of the tongue. Depression,  confusion, or memory problems. Trouble walking. If anemia is severe, symptoms can include: Shortness of breath. Dizziness. Rapid heart rate. How is this diagnosed? This condition may be diagnosed with a blood test to measure the level of vitamin B12 in your blood. You may also have other tests, including: A group of tests that measure certain characteristics of blood cells (complete blood count, CBC). A blood test to measure intrinsic factor. A procedure where a thin tube with a camera on the end is used to look into your stomach or intestines (endoscopy). Other tests may be needed to discover the cause of the deficiency. How is this treated? Treatment for this condition depends on the cause. This condition may be treated by: Changing your eating and drinking habits, such as: Eating more foods that contain vitamin B12. Drinking less alcohol or no alcohol. Getting vitamin B12 injections. Taking vitamin B12 supplements by mouth (orally). Your health care provider will tell you which dose is best for you. Follow these instructions at home: Eating and drinking  Include foods in your diet that come from animals and contain a lot of vitamin B12. These include: Meats and poultry. This includes beef, pork, chicken, turkey, and organ meats, such as liver. Seafood. This includes clams, rainbow trout, salmon, tuna, and haddock. Eggs. Dairy foods such as milk, yogurt, and cheese. Eat foods that have vitamin B12 added to them (are fortified), such as ready-to-eat breakfast cereals. Check the label on the package to see if a food is fortified. The items listed above may not be a complete list of foods and beverages you can eat and drink. Contact a dietitian for   more information. Alcohol use Do not drink alcohol if: Your health care provider tells you not to drink. You are pregnant, may be pregnant, or are planning to become pregnant. If you drink alcohol: Limit how much you have to: 0-1 drink a  day for women. 0-2 drinks a day for men. Know how much alcohol is in your drink. In the [place] one drink equals one 12 oz bottle of beer (355 mL), one 5 oz glass of wine (148 mL), or one 1 oz glass of hard liquor (44 mL). General instructions Get vitamin B12 injections if told to by your health care provider. Take supplements only as told by your health care provider. Follow the directions carefully. Keep all follow-up visits. This is important. Contact a health care provider if: Your symptoms come back. Your symptoms get worse or do not improve with treatment. Get help right away: You develop shortness of breath. You have a rapid heart rate. You have chest pain. You become dizzy or you faint. These symptoms may be an emergency. Get help right away. Call 911. Do not wait to see if the symptoms will go away. Do not drive yourself to the hospital. Summary Vitamin B12 deficiency occurs when the body does not have enough of this important vitamin. Common causes include not eating enough foods that contain vitamin B12, not being able to absorb vitamin B12 from the food that you eat, having a surgery in which part of the stomach or small intestine is removed, or taking certain medicines. Eat foods that have vitamin B12 in them. Treatment may include making a change in the way you eat and drink, getting vitamin B12 injections, or taking vitamin B12 supplements. This information is not intended to replace advice given to you by your health care provider. Make sure you discuss any questions you have with your health care provider. Document Revised: 08/01/2021 Document Reviewed: 08/01/2021 Elsevier Patient Education  2023 [employer]  

## 2022-10-20 ENCOUNTER — Other Ambulatory Visit: Payer: Self-pay | Admitting: Physician Assistant

## 2022-12-18 ENCOUNTER — Inpatient Hospital Stay: Payer: Medicare HMO | Attending: Hematology & Oncology

## 2022-12-18 VITALS — BP 142/58 | HR 71 | Temp 97.5°F | Resp 18

## 2022-12-18 DIAGNOSIS — E538 Deficiency of other specified B group vitamins: Secondary | ICD-10-CM | POA: Insufficient documentation

## 2022-12-18 MED ORDER — CYANOCOBALAMIN 1000 MCG/ML IJ SOLN
1000.0000 ug | Freq: Once | INTRAMUSCULAR | Status: AC
  Administered 2022-12-18: 1000 ug via INTRAMUSCULAR
  Filled 2022-12-18: qty 1

## 2022-12-18 NOTE — Patient Instructions (Signed)
Vitamin B12 Deficiency Vitamin B12 deficiency occurs when the body does not have enough of this important vitamin. The body needs this vitamin: To make red blood cells. To make DNA. This is the genetic material inside cells. To help the nerves work properly so they can carry messages from the brain to the body. Vitamin B12 deficiency can cause health problems, such as not having enough red blood cells in the blood (anemia). This can lead to nerve damage if untreated. What are the causes? This condition may be caused by: Not eating enough foods that contain vitamin B12. Not having enough stomach acid and digestive fluids to properly absorb vitamin B12 from the food that you eat. Having certain diseases that make it hard to absorb vitamin B12. These diseases include Crohn's disease, chronic pancreatitis, and cystic fibrosis. An autoimmune disorder in which the body does not make enough of a protein (intrinsic factor) within the stomach, resulting in not enough absorption of vitamin B12. Having a surgery in which part of the stomach or small intestine is removed. Taking certain medicines that make it hard for the body to absorb vitamin B12. These include: Heartburn medicines, such as antacids and proton pump inhibitors. Some medicines that are used to treat diabetes. What increases the risk? The following factors may make you more likely to develop a vitamin B12 deficiency: Being an older adult. Eating a vegetarian or vegan diet that does not include any foods that come from animals. Eating a poor diet while you are pregnant. Taking certain medicines. Having alcoholism. What are the signs or symptoms? In some cases, there are no symptoms of this condition. If the condition leads to anemia or nerve damage, various symptoms may occur, such as: Weakness. Tiredness (fatigue). Loss of appetite. Numbness or tingling in your hands and feet. Redness and burning of the tongue. Depression,  confusion, or memory problems. Trouble walking. If anemia is severe, symptoms can include: Shortness of breath. Dizziness. Rapid heart rate. How is this diagnosed? This condition may be diagnosed with a blood test to measure the level of vitamin B12 in your blood. You may also have other tests, including: A group of tests that measure certain characteristics of blood cells (complete blood count, CBC). A blood test to measure intrinsic factor. A procedure where a thin tube with a camera on the end is used to look into your stomach or intestines (endoscopy). Other tests may be needed to discover the cause of the deficiency. How is this treated? Treatment for this condition depends on the cause. This condition may be treated by: Changing your eating and drinking habits, such as: Eating more foods that contain vitamin B12. Drinking less alcohol or no alcohol. Getting vitamin B12 injections. Taking vitamin B12 supplements by mouth (orally). Your health care provider will tell you which dose is best for you. Follow these instructions at home: Eating and drinking  Include foods in your diet that come from animals and contain a lot of vitamin B12. These include: Meats and poultry. This includes beef, pork, chicken, turkey, and organ meats, such as liver. Seafood. This includes clams, rainbow trout, salmon, tuna, and haddock. Eggs. Dairy foods such as milk, yogurt, and cheese. Eat foods that have vitamin B12 added to them (are fortified), such as ready-to-eat breakfast cereals. Check the label on the package to see if a food is fortified. The items listed above may not be a complete list of foods and beverages you can eat and drink. Contact a dietitian for   more information. Alcohol use Do not drink alcohol if: Your health care provider tells you not to drink. You are pregnant, may be pregnant, or are planning to become pregnant. If you drink alcohol: Limit how much you have to: 0-1 drink a  day for women. 0-2 drinks a day for men. Know how much alcohol is in your drink. In the [place] one drink equals one 12 oz bottle of beer (355 mL), one 5 oz glass of wine (148 mL), or one 1 oz glass of hard liquor (44 mL). General instructions Get vitamin B12 injections if told to by your health care provider. Take supplements only as told by your health care provider. Follow the directions carefully. Keep all follow-up visits. This is important. Contact a health care provider if: Your symptoms come back. Your symptoms get worse or do not improve with treatment. Get help right away: You develop shortness of breath. You have a rapid heart rate. You have chest pain. You become dizzy or you faint. These symptoms may be an emergency. Get help right away. Call 911. Do not wait to see if the symptoms will go away. Do not drive yourself to the hospital. Summary Vitamin B12 deficiency occurs when the body does not have enough of this important vitamin. Common causes include not eating enough foods that contain vitamin B12, not being able to absorb vitamin B12 from the food that you eat, having a surgery in which part of the stomach or small intestine is removed, or taking certain medicines. Eat foods that have vitamin B12 in them. Treatment may include making a change in the way you eat and drink, getting vitamin B12 injections, or taking vitamin B12 supplements. This information is not intended to replace advice given to you by your health care provider. Make sure you discuss any questions you have with your health care provider. Document Revised: 08/01/2021 Document Reviewed: 08/01/2021 Elsevier Patient Education  2023 [employer]  

## 2023-01-19 ENCOUNTER — Other Ambulatory Visit: Payer: Self-pay | Admitting: Physician Assistant

## 2023-03-18 ENCOUNTER — Inpatient Hospital Stay: Payer: Medicare HMO

## 2023-03-18 ENCOUNTER — Other Ambulatory Visit: Payer: Self-pay

## 2023-03-18 ENCOUNTER — Inpatient Hospital Stay: Payer: Medicare HMO | Admitting: Hematology & Oncology

## 2023-03-18 ENCOUNTER — Encounter: Payer: Self-pay | Admitting: Hematology & Oncology

## 2023-03-18 ENCOUNTER — Inpatient Hospital Stay: Payer: Medicare HMO | Attending: Hematology & Oncology

## 2023-03-18 VITALS — BP 157/68 | HR 61 | Temp 98.4°F | Resp 18 | Wt 193.0 lb

## 2023-03-18 DIAGNOSIS — E538 Deficiency of other specified B group vitamins: Secondary | ICD-10-CM | POA: Diagnosis present

## 2023-03-18 DIAGNOSIS — D510 Vitamin B12 deficiency anemia due to intrinsic factor deficiency: Secondary | ICD-10-CM

## 2023-03-18 LAB — CBC WITH DIFFERENTIAL (CANCER CENTER ONLY)
Abs Immature Granulocytes: 0.19 10*3/uL — ABNORMAL HIGH (ref 0.00–0.07)
Basophils Absolute: 0 10*3/uL (ref 0.0–0.1)
Basophils Relative: 0 %
Eosinophils Absolute: 0.1 10*3/uL (ref 0.0–0.5)
Eosinophils Relative: 1 %
HCT: 39.2 % (ref 39.0–52.0)
Hemoglobin: 13 g/dL (ref 13.0–17.0)
Immature Granulocytes: 1 %
Lymphocytes Relative: 12 %
Lymphs Abs: 1.9 10*3/uL (ref 0.7–4.0)
MCH: 30 pg (ref 26.0–34.0)
MCHC: 33.2 g/dL (ref 30.0–36.0)
MCV: 90.3 fL (ref 80.0–100.0)
Monocytes Absolute: 1.3 10*3/uL — ABNORMAL HIGH (ref 0.1–1.0)
Monocytes Relative: 8 %
Neutro Abs: 12.3 10*3/uL — ABNORMAL HIGH (ref 1.7–7.7)
Neutrophils Relative %: 78 %
Platelet Count: 265 10*3/uL (ref 150–400)
RBC: 4.34 MIL/uL (ref 4.22–5.81)
RDW: 13.6 % (ref 11.5–15.5)
WBC Count: 15.8 10*3/uL — ABNORMAL HIGH (ref 4.0–10.5)
nRBC: 0 % (ref 0.0–0.2)

## 2023-03-18 LAB — CMP (CANCER CENTER ONLY)
ALT: 12 U/L (ref 0–44)
AST: 14 U/L — ABNORMAL LOW (ref 15–41)
Albumin: 4.5 g/dL (ref 3.5–5.0)
Alkaline Phosphatase: 47 U/L (ref 38–126)
Anion gap: 7 (ref 5–15)
BUN: 21 mg/dL (ref 8–23)
CO2: 29 mmol/L (ref 22–32)
Calcium: 9.3 mg/dL (ref 8.9–10.3)
Chloride: 104 mmol/L (ref 98–111)
Creatinine: 1.14 mg/dL (ref 0.61–1.24)
GFR, Estimated: >60 mL/min (ref >60)
Glucose, Bld: 106 mg/dL — ABNORMAL HIGH (ref 70–99)
Potassium: 4.8 mmol/L (ref 3.5–5.1)
Sodium: 140 mmol/L (ref 135–145)
Total Bilirubin: 0.6 mg/dL (ref 0.3–1.2)
Total Protein: 6.5 g/dL (ref 6.5–8.1)

## 2023-03-18 LAB — VITAMIN B12: Vitamin B-12: 251 pg/mL (ref 180–914)

## 2023-03-18 MED ORDER — CYANOCOBALAMIN 1000 MCG/ML IJ SOLN
1000.0000 ug | Freq: Once | INTRAMUSCULAR | Status: AC
  Administered 2023-03-18: 1000 ug via INTRAMUSCULAR
  Filled 2023-03-18: qty 1

## 2023-03-18 NOTE — Progress Notes (Signed)
Hematology and Oncology Follow Up Visit  [name] [id number] Aug 13, 1941 82 y.o. 03/18/2023   Principle Diagnosis:  B 12 deficiency   Current Therapy:   Vitamin B12 injection -- q 3 months   Interim History:  [name] is here today for follow-up.  We saw him 6 months ago.  Overall, I think is doing okay.  He does have issues with his congestive heart failure.  He says that his last ejection fraction was 35%.  He has been followed by Cardiology.  He has had no other problems since we last saw him.  He has had no problems with COVID.  He has had no change in bowel or bladder habits.  He has had no issues with cough or shortness of breath.  There is been no rashes.  He has had no leg swelling.  His last vitamin B12 level in September 2023 was 334.  Currently, I was his performance status is probably ECOG 1.    Medications:  Allergies as of 03/18/2023       Reactions   Tamsulosin Hcl Other (See Comments)   Pre-syncope   Flagyl [metronidazole] Nausea And Vomiting   States IV form causes to allergy   Lipitor [atorvastatin] Other (See Comments)   Myalgia   Tramadol Other (See Comments)   Dizziness        Medication List        Accurate as of March 18, 2023 11:35 AM. If you have any questions, ask your nurse or doctor.          apixaban 5 MG Tabs tablet Commonly known as: ELIQUIS Take 5 mg by mouth 2 (two) times daily.   ciprofloxacin-dexamethasone OTIC suspension Commonly known as: CIPRODEX SMARTSIG:In Ear(s)   Euthyrox 75 MCG tablet Generic drug: levothyroxine TAKE 1 TABLET BY MOUTH ONCE DAILY IN THE MORNING ON AN EMPTY STOMACH. NEEDS TO MAKE AN APPOINTMENT   hydrocortisone 2.5 % cream Apply topically.   meclizine 25 MG tablet Commonly known as: ANTIVERT TAKE 1 TABLET BY MOUTH AS NEEDED   meloxicam 15 MG tablet Commonly known as: MOBIC Take 1 tablet by mouth once daily   metoprolol tartrate 50 MG tablet Commonly known as: LOPRESSOR Take  50 mg by mouth 2 (two) times daily.   MULTIVITAMIN ADULT PO Take 2 tablets by mouth daily.   omeprazole 40 MG capsule Commonly known as: PRILOSEC SMARTSIG:1 Capsule(s) By Mouth Every Evening   simvastatin 40 MG tablet Commonly known as: ZOCOR Take 1 tablet by mouth once daily        Allergies:  Allergies  Allergen Reactions   Tamsulosin Hcl Other (See Comments)    Pre-syncope    Flagyl [Metronidazole] Nausea And Vomiting    States IV form causes to allergy   Lipitor [Atorvastatin] Other (See Comments)    Myalgia   Tramadol Other (See Comments)    Dizziness    Past Medical History, Surgical history, Social history, and Family History were reviewed and updated.  Review of Systems: Review of Systems  Constitutional: Negative.   HENT: Negative.    Eyes: Negative.   Respiratory: Negative.    Cardiovascular: Negative.   Gastrointestinal: Negative.   Genitourinary: Negative.   Musculoskeletal: Negative.   Skin: Negative.   Neurological: Negative.   Endo/Heme/Allergies: Negative.   Psychiatric/Behavioral: Negative.       Physical Exam:  weight is 193 lb (87.5 kg). His oral temperature is 98.4 F (36.9 C). His blood pressure is 157/68 (abnormal) and his  pulse is 61. His respiration is 18 and oxygen saturation is 99%.   Wt Readings from Last 3 Encounters:  03/18/23 193 lb (87.5 kg)  09/17/22 199 lb (90.3 kg)  03/16/22 194 lb (88 kg)    Physical Exam Vitals reviewed.  HENT:     Head: Normocephalic and atraumatic.  Eyes:     Pupils: Pupils are equal, round, and reactive to light.  Cardiovascular:     Rate and Rhythm: Normal rate and regular rhythm.     Heart sounds: Normal heart sounds.  Pulmonary:     Effort: Pulmonary effort is normal.     Breath sounds: Normal breath sounds.  Abdominal:     General: Bowel sounds are normal.     Palpations: Abdomen is soft.  Musculoskeletal:        General: No tenderness or deformity. Normal range of motion.      Cervical back: Normal range of motion.  Lymphadenopathy:     Cervical: No cervical adenopathy.  Skin:    General: Skin is warm and dry.     Findings: No erythema or rash.  Neurological:     Mental Status: He is alert and oriented to person, place, and time.  Psychiatric:        Behavior: Behavior normal.        Thought Content: Thought content normal.        Judgment: Judgment normal.      Lab Results  Component Value Date   WBC 15.8 (H) 03/18/2023   HGB 13.0 03/18/2023   HCT 39.2 03/18/2023   MCV 90.3 03/18/2023   PLT 265 03/18/2023   No results found for: "FERRITIN", "IRON", "TIBC", "UIBC", "IRONPCTSAT" Lab Results  Component Value Date   RBC 4.34 03/18/2023   No results found for: "KPAFRELGTCHN", "LAMBDASER", "KAPLAMBRATIO" No results found for: "IGGSERUM", "IGA", "IGMSERUM" No results found for: "TOTALPROTELP", "ALBUMINELP", "A1GS", "A2GS", "BETS", "BETA2SER", "GAMS", "MSPIKE", "SPEI"   Chemistry      Component Value Date/Time   NA 140 03/18/2023 1009   K 4.8 03/18/2023 1009   CL 104 03/18/2023 1009   CO2 29 03/18/2023 1009   BUN 21 03/18/2023 1009   CREATININE 1.14 03/18/2023 1009   CREATININE 1.02 03/09/2018 0838      Component Value Date/Time   CALCIUM 9.3 03/18/2023 1009   ALKPHOS 47 03/18/2023 1009   AST 14 (L) 03/18/2023 1009   ALT 12 03/18/2023 1009   BILITOT 0.6 03/18/2023 1009       Impression and Plan: [name] is a very pleasant 82 yo [place] gentleman with history of B12 deficiency (diagnosed in 2019).   His levels have been good with his vitamin B12.  We do the vitamin B12 every 3 months.  I think this is a good interval for him.  We will plan to see him back in 6 months.  He gets his B12 every 3 months.  I think this is acceptable according to his B12 levels.  Hopefully, his congestive heart failure will not worsen.     Greg Napoleon, MD 3/28/202411:35 AM

## 2023-03-18 NOTE — Patient Instructions (Signed)
Vitamin B12 Deficiency Vitamin B12 deficiency occurs when the body does not have enough of this important vitamin. The body needs this vitamin: To make red blood cells. To make DNA. This is the genetic material inside cells. To help the nerves work properly so they can carry messages from the brain to the body. Vitamin B12 deficiency can cause health problems, such as not having enough red blood cells in the blood (anemia). This can lead to nerve damage if untreated. What are the causes? This condition may be caused by: Not eating enough foods that contain vitamin B12. Not having enough stomach acid and digestive fluids to properly absorb vitamin B12 from the food that you eat. Having certain diseases that make it hard to absorb vitamin B12. These diseases include Crohn's disease, chronic pancreatitis, and cystic fibrosis. An autoimmune disorder in which the body does not make enough of a protein (intrinsic factor) within the stomach, resulting in not enough absorption of vitamin B12. Having a surgery in which part of the stomach or small intestine is removed. Taking certain medicines that make it hard for the body to absorb vitamin B12. These include: Heartburn medicines, such as antacids and proton pump inhibitors. Some medicines that are used to treat diabetes. What increases the risk? The following factors may make you more likely to develop a vitamin B12 deficiency: Being an older adult. Eating a vegetarian or vegan diet that does not include any foods that come from animals. Eating a poor diet while you are pregnant. Taking certain medicines. Having alcoholism. What are the signs or symptoms? In some cases, there are no symptoms of this condition. If the condition leads to anemia or nerve damage, various symptoms may occur, such as: Weakness. Tiredness (fatigue). Loss of appetite. Numbness or tingling in your hands and feet. Redness and burning of the tongue. Depression,  confusion, or memory problems. Trouble walking. If anemia is severe, symptoms can include: Shortness of breath. Dizziness. Rapid heart rate. How is this diagnosed? This condition may be diagnosed with a blood test to measure the level of vitamin B12 in your blood. You may also have other tests, including: A group of tests that measure certain characteristics of blood cells (complete blood count, CBC). A blood test to measure intrinsic factor. A procedure where a thin tube with a camera on the end is used to look into your stomach or intestines (endoscopy). Other tests may be needed to discover the cause of the deficiency. How is this treated? Treatment for this condition depends on the cause. This condition may be treated by: Changing your eating and drinking habits, such as: Eating more foods that contain vitamin B12. Drinking less alcohol or no alcohol. Getting vitamin B12 injections. Taking vitamin B12 supplements by mouth (orally). Your health care provider will tell you which dose is best for you. Follow these instructions at home: Eating and drinking  Include foods in your diet that come from animals and contain a lot of vitamin B12. These include: Meats and poultry. This includes beef, pork, chicken, turkey, and organ meats, such as liver. Seafood. This includes clams, rainbow trout, salmon, tuna, and haddock. Eggs. Dairy foods such as milk, yogurt, and cheese. Eat foods that have vitamin B12 added to them (are fortified), such as ready-to-eat breakfast cereals. Check the label on the package to see if a food is fortified. The items listed above may not be a complete list of foods and beverages you can eat and drink. Contact a dietitian for   more information. Alcohol use Do not drink alcohol if: Your health care provider tells you not to drink. You are pregnant, may be pregnant, or are planning to become pregnant. If you drink alcohol: Limit how much you have to: 0-1 drink a  day for women. 0-2 drinks a day for men. Know how much alcohol is in your drink. In the [place] one drink equals one 12 oz bottle of beer (355 mL), one 5 oz glass of wine (148 mL), or one 1 oz glass of hard liquor (44 mL). General instructions Get vitamin B12 injections if told to by your health care provider. Take supplements only as told by your health care provider. Follow the directions carefully. Keep all follow-up visits. This is important. Contact a health care provider if: Your symptoms come back. Your symptoms get worse or do not improve with treatment. Get help right away: You develop shortness of breath. You have a rapid heart rate. You have chest pain. You become dizzy or you faint. These symptoms may be an emergency. Get help right away. Call 911. Do not wait to see if the symptoms will go away. Do not drive yourself to the hospital. Summary Vitamin B12 deficiency occurs when the body does not have enough of this important vitamin. Common causes include not eating enough foods that contain vitamin B12, not being able to absorb vitamin B12 from the food that you eat, having a surgery in which part of the stomach or small intestine is removed, or taking certain medicines. Eat foods that have vitamin B12 in them. Treatment may include making a change in the way you eat and drink, getting vitamin B12 injections, or taking vitamin B12 supplements. This information is not intended to replace advice given to you by your health care provider. Make sure you discuss any questions you have with your health care provider. Document Revised: 08/01/2021 Document Reviewed: 08/01/2021 Elsevier Patient Education  2023 [employer]  

## 2023-06-29 ENCOUNTER — Emergency Department (HOSPITAL_BASED_OUTPATIENT_CLINIC_OR_DEPARTMENT_OTHER): Payer: Medicare HMO

## 2023-06-29 ENCOUNTER — Encounter (HOSPITAL_BASED_OUTPATIENT_CLINIC_OR_DEPARTMENT_OTHER): Payer: Self-pay

## 2023-06-29 ENCOUNTER — Encounter: Payer: Self-pay | Admitting: Family

## 2023-06-29 ENCOUNTER — Other Ambulatory Visit: Payer: Self-pay

## 2023-06-29 ENCOUNTER — Observation Stay (HOSPITAL_BASED_OUTPATIENT_CLINIC_OR_DEPARTMENT_OTHER)
Admission: EM | Admit: 2023-06-29 | Discharge: 2023-06-30 | Disposition: A | Discharge status: Home / Self Care | Payer: Medicare HMO | Attending: Internal Medicine | Admitting: Internal Medicine

## 2023-06-29 DIAGNOSIS — Z86711 Personal history of pulmonary embolism: Secondary | ICD-10-CM | POA: Diagnosis not present

## 2023-06-29 DIAGNOSIS — Z96641 Presence of right artificial hip joint: Secondary | ICD-10-CM | POA: Diagnosis not present

## 2023-06-29 DIAGNOSIS — Z87891 Personal history of nicotine dependence: Secondary | ICD-10-CM | POA: Diagnosis not present

## 2023-06-29 DIAGNOSIS — E875 Hyperkalemia: Secondary | ICD-10-CM | POA: Diagnosis not present

## 2023-06-29 DIAGNOSIS — E039 Hypothyroidism, unspecified: Secondary | ICD-10-CM | POA: Diagnosis not present

## 2023-06-29 DIAGNOSIS — N179 Acute kidney failure, unspecified: Principal | ICD-10-CM | POA: Diagnosis present

## 2023-06-29 DIAGNOSIS — E871 Hypo-osmolality and hyponatremia: Secondary | ICD-10-CM | POA: Insufficient documentation

## 2023-06-29 DIAGNOSIS — Z7901 Long term (current) use of anticoagulants: Secondary | ICD-10-CM | POA: Diagnosis not present

## 2023-06-29 DIAGNOSIS — I2510 Atherosclerotic heart disease of native coronary artery without angina pectoris: Secondary | ICD-10-CM | POA: Diagnosis not present

## 2023-06-29 DIAGNOSIS — I5022 Chronic systolic (congestive) heart failure: Secondary | ICD-10-CM

## 2023-06-29 DIAGNOSIS — I959 Hypotension, unspecified: Secondary | ICD-10-CM | POA: Diagnosis present

## 2023-06-29 DIAGNOSIS — Z79899 Other long term (current) drug therapy: Secondary | ICD-10-CM | POA: Insufficient documentation

## 2023-06-29 DIAGNOSIS — Z8673 Personal history of transient ischemic attack (TIA), and cerebral infarction without residual deficits: Secondary | ICD-10-CM | POA: Diagnosis not present

## 2023-06-29 DIAGNOSIS — E7800 Pure hypercholesterolemia, unspecified: Secondary | ICD-10-CM | POA: Diagnosis not present

## 2023-06-29 DIAGNOSIS — Z9049 Acquired absence of other specified parts of digestive tract: Secondary | ICD-10-CM | POA: Diagnosis not present

## 2023-06-29 DIAGNOSIS — I480 Paroxysmal atrial fibrillation: Secondary | ICD-10-CM | POA: Diagnosis present

## 2023-06-29 DIAGNOSIS — E861 Hypovolemia: Secondary | ICD-10-CM

## 2023-06-29 LAB — BASIC METABOLIC PANEL
Anion gap: 6 (ref 5–15)
BUN: 31 mg/dL — ABNORMAL HIGH (ref 8–23)
CO2: 21 mmol/L — ABNORMAL LOW (ref 22–32)
Calcium: 7.6 mg/dL — ABNORMAL LOW (ref 8.9–10.3)
Chloride: 108 mmol/L (ref 98–111)
Creatinine, Ser: 1.43 mg/dL — ABNORMAL HIGH (ref 0.61–1.24)
GFR, Estimated: 49 mL/min — ABNORMAL LOW (ref >60)
Glucose, Bld: 123 mg/dL — ABNORMAL HIGH (ref 70–99)
Potassium: 4.3 mmol/L (ref 3.5–5.1)
Sodium: 135 mmol/L (ref 135–145)

## 2023-06-29 LAB — CBC WITH DIFFERENTIAL/PLATELET
Abs Immature Granulocytes: 0.05 10*3/uL (ref 0.00–0.07)
Basophils Absolute: 0 10*3/uL (ref 0.0–0.1)
Basophils Relative: 0 %
Eosinophils Absolute: 0.1 10*3/uL (ref 0.0–0.5)
Eosinophils Relative: 1 %
HCT: 38.3 % — ABNORMAL LOW (ref 39.0–52.0)
Hemoglobin: 12.6 g/dL — ABNORMAL LOW (ref 13.0–17.0)
Immature Granulocytes: 1 %
Lymphocytes Relative: 16 %
Lymphs Abs: 1.6 10*3/uL (ref 0.7–4.0)
MCH: 29.9 pg (ref 26.0–34.0)
MCHC: 32.9 g/dL (ref 30.0–36.0)
MCV: 91 fL (ref 80.0–100.0)
Monocytes Absolute: 0.9 10*3/uL (ref 0.1–1.0)
Monocytes Relative: 9 %
Neutro Abs: 7.1 10*3/uL (ref 1.7–7.7)
Neutrophils Relative %: 73 %
Platelets: 166 10*3/uL (ref 150–400)
RBC: 4.21 MIL/uL — ABNORMAL LOW (ref 4.22–5.81)
RDW: 13.4 % (ref 11.5–15.5)
WBC: 9.8 10*3/uL (ref 4.0–10.5)
nRBC: 0 % (ref 0.0–0.2)

## 2023-06-29 LAB — URINALYSIS, ROUTINE W REFLEX MICROSCOPIC
Bilirubin Urine: NEGATIVE
Glucose, UA: 100 mg/dL — AB
Hgb urine dipstick: NEGATIVE
Ketones, ur: NEGATIVE mg/dL
Leukocytes,Ua: NEGATIVE
Nitrite: NEGATIVE
Protein, ur: NEGATIVE mg/dL
Specific Gravity, Urine: 1.015 (ref 1.005–1.030)
pH: 6.5 (ref 5.0–8.0)

## 2023-06-29 LAB — COMPREHENSIVE METABOLIC PANEL
ALT: 15 U/L (ref 0–44)
AST: 16 U/L (ref 15–41)
Albumin: 3.7 g/dL (ref 3.5–5.0)
Alkaline Phosphatase: 39 U/L (ref 38–126)
Anion gap: 7 (ref 5–15)
BUN: 34 mg/dL — ABNORMAL HIGH (ref 8–23)
CO2: 21 mmol/L — ABNORMAL LOW (ref 22–32)
Calcium: 8.6 mg/dL — ABNORMAL LOW (ref 8.9–10.3)
Chloride: 105 mmol/L (ref 98–111)
Creatinine, Ser: 1.61 mg/dL — ABNORMAL HIGH (ref 0.61–1.24)
GFR, Estimated: 43 mL/min — ABNORMAL LOW (ref >60)
Glucose, Bld: 105 mg/dL — ABNORMAL HIGH (ref 70–99)
Potassium: 6.1 mmol/L — ABNORMAL HIGH (ref 3.5–5.1)
Sodium: 133 mmol/L — ABNORMAL LOW (ref 135–145)
Total Bilirubin: 1 mg/dL (ref 0.3–1.2)
Total Protein: 6.4 g/dL — ABNORMAL LOW (ref 6.5–8.1)

## 2023-06-29 LAB — LACTIC ACID, PLASMA
Lactic Acid, Venous: 0.9 mmol/L (ref 0.5–1.9)
Lactic Acid, Venous: 1.1 mmol/L (ref 0.5–1.9)

## 2023-06-29 LAB — BRAIN NATRIURETIC PEPTIDE: B Natriuretic Peptide: 14.7 pg/mL (ref 0.0–100.0)

## 2023-06-29 LAB — MAGNESIUM: Magnesium: 2 mg/dL (ref 1.7–2.4)

## 2023-06-29 LAB — TROPONIN I (HIGH SENSITIVITY): Troponin I (High Sensitivity): 7 ng/L (ref <18)

## 2023-06-29 MED ORDER — SODIUM CHLORIDE 0.9 % IV SOLN: INTRAVENOUS | Status: DC

## 2023-06-29 MED ORDER — SODIUM CHLORIDE 0.9 % IV BOLUS
1000.0000 mL | Freq: Once | INTRAVENOUS | Status: AC
  Administered 2023-06-29: 1000 mL via INTRAVENOUS

## 2023-06-29 MED ORDER — SODIUM ZIRCONIUM CYCLOSILICATE 10 G PO PACK
10.0000 g | PACK | Freq: Once | ORAL | Status: AC
  Administered 2023-06-29: 10 g via ORAL
  Filled 2023-06-29: qty 1

## 2023-06-29 MED ORDER — DEXTROSE 10 % IV SOLN: Freq: Once | INTRAVENOUS | Status: AC

## 2023-06-29 MED ORDER — ALBUTEROL SULFATE (2.5 MG/3ML) 0.083% IN NEBU
7.5000 mg | INHALATION_SOLUTION | Freq: Once | RESPIRATORY_TRACT | Status: AC
  Administered 2023-06-29: 7.5 mg via RESPIRATORY_TRACT

## 2023-06-29 MED ORDER — DEXTROSE 50 % IV SOLN
1.0000 | Freq: Once | INTRAVENOUS | Status: AC
  Administered 2023-06-29: 50 mL via INTRAVENOUS
  Filled 2023-06-29: qty 50

## 2023-06-29 MED ORDER — INSULIN ASPART 100 UNIT/ML IV SOLN
5.0000 [IU] | Freq: Once | INTRAVENOUS | Status: AC
  Administered 2023-06-29: 5 [IU] via INTRAVENOUS

## 2023-06-29 MED ORDER — ALBUTEROL SULFATE (2.5 MG/3ML) 0.083% IN NEBU
INHALATION_SOLUTION | RESPIRATORY_TRACT | Status: AC
  Filled 2023-06-29: qty 9

## 2023-06-29 MED ORDER — ALBUTEROL SULFATE (2.5 MG/3ML) 0.083% IN NEBU
10.0000 mg | INHALATION_SOLUTION | Freq: Once | RESPIRATORY_TRACT | Status: AC
  Administered 2023-06-29: 2.5 mg via RESPIRATORY_TRACT
  Filled 2023-06-29: qty 12

## 2023-06-29 NOTE — ED Notes (Signed)
[facility] called for transport @19 :28

## 2023-06-29 NOTE — H&P (Signed)
History and Physical    [name] ZOX:[id number] DOB: Jul 23, 1941 DOA: 06/29/2023  PCP: Kirt Boys, PA-C  Patient coming from: [facility] ED  Chief Complaint: Hypotension  HPI: [name] is a 82 y.o. male with medical history significant of mild nonobstructive CAD per [facility] in 2015, chronic HFrEF/nonischemic cardiomyopathy, moderate aortic regurgitation, nonrheumatic mitral regurgitation, paroxysmal A-fib on Eliquis, hyperlipidemia, hypothyroidism, history of PE, former cigarette smoker, B12 deficiency, history of TIA, restrictive lung disease presented to ED with complaints of hypotension and fatigue.  Takes metoprolol, spironolactone, and lisinopril at home.  Noted to be hypotensive with systolic as low as 80s in the ED.  Afebrile and not tachycardic.  Not hypoxic.  Labs showing no leukocytosis, hemoglobin 12.6 (at baseline), sodium 133, potassium 6.1, BUN 34, creatinine 1.6 (baseline 0.8-1.0), troponin negative, BNP normal, lactic acid normal x 2, UA not suggestive of infection, magnesium 2.0.  Chest x-ray showing clear lungs.  Patient received albuterol, insulin/dextrose, Lokelma, and 2 L normal saline boluses in the ED.  Hyperkalemia resolved after medical treatment.  Potassium 4.3 on repeat labs and also creatinine improved to 1.4 with IV fluids.  Blood pressure improved.  Patient states he went to the gym yesterday and walked on the treadmill for close to an hour.  Afterwards he felt nauseous and when he checked his blood pressure it was low with systolic in the low 90s.  States normally his systolic blood pressure is 120-130.  He has continued to feel weak since then and today his blood pressure was even lower with systolic in the 80s.  He has not been drinking enough fluids and takes 3 different antihypertensives including metoprolol, lisinopril, and spironolactone.  He did take his blood pressure medications this morning.  Denies dizziness or loss of consciousness.  Denies  fevers, cough, shortness of breath, chest pain, vomiting, abdominal pain, or diarrhea.  Review of Systems:  Review of Systems  All other systems reviewed and are negative.   Past Medical History:  Diagnosis Date   Abdominal wall defect, acquired    Blockage of coronary artery of heart (HCC)    30%   Hearing aid worn    Insulin resistance    LV dysfunction    reduction   Osteoarthritis    Pernicious anemia 03/03/2021   Pulmonary embolism [facility])     Past Surgical History:  Procedure Laterality Date   ACNE CYST REMOVAL  2012   Back    APPENDECTOMY     CARDIAC CATHETERIZATION     COLECTOMY  11/2013   [facility], 2nd to diverticulitis   RT hip replacement     TONSILLECTOMY       reports that he quit smoking about 37 years ago. His smoking use included cigarettes. He has a 15.00 pack-year smoking history. He has never used smokeless tobacco. He reports that he does not drink alcohol and does not use drugs.  Allergies  Allergen Reactions   Tamsulosin Hcl Other (See Comments)    Pre-syncope    Flagyl [Metronidazole] Nausea And Vomiting    States IV form causes to allergy   Lipitor [Atorvastatin] Other (See Comments)    Myalgia   Tramadol Other (See Comments)    Dizziness    Family History  Problem Relation Age of Onset   Heart disease Mother        pacemaker    Prior to Admission medications   Medication Sig Start Date End Date Taking? Authorizing Provider  apixaban (ELIQUIS) 5 MG  TABS tablet Take 5 mg by mouth 2 (two) times daily.  04/08/17   [provider]  ciprofloxacin-dexamethasone (CIPRODEX) OTIC suspension SMARTSIG:In Ear(s) 07/02/21   [provider]  EUTHYROX 75 MCG tablet TAKE 1 TABLET BY MOUTH ONCE DAILY IN THE MORNING ON AN EMPTY STOMACH. NEEDS TO MAKE AN APPOINTMENT 08/21/21   Jarold Motto, PA  hydrocortisone 2.5 % cream Apply topically. 01/24/21   [provider]  meclizine (ANTIVERT) 25 MG tablet TAKE 1 TABLET BY MOUTH AS NEEDED  10/20/21   Jarold Motto, PA  meloxicam [facility]) 15 MG tablet Take 1 tablet by mouth once daily 01/23/22   Jarold Motto, PA  metoprolol tartrate (LOPRESSOR) 50 MG tablet Take 50 mg by mouth 2 (two) times daily.    [provider]  Multiple Vitamin (MULTIVITAMIN ADULT PO) Take 2 tablets by mouth daily. 08/28/20   [provider]  omeprazole (PRILOSEC) 40 MG capsule SMARTSIG:1 Capsule(s) By Mouth Every Evening 01/08/21   [provider]  simvastatin (ZOCOR) 40 MG tablet Take 1 tablet by mouth once daily 10/20/22   Jarold Motto, [place]    Physical Exam: Vitals:   06/29/23 1800 06/29/23 1900 06/29/23 2023 06/29/23 2115  BP: 132/60 (!) 117/49  (!) 105/51  Pulse: 72 71  96  Resp: 13 11  18   Temp:   (!) 97.5 F (36.4 C) 97.7 F (36.5 C)  TempSrc:      SpO2: 100% 100%  99%  Weight:      Height:        Physical Exam Vitals reviewed.  Constitutional:      General: He is not in acute distress. HENT:     Head: Normocephalic and atraumatic.     Mouth/Throat:     Mouth: Mucous membranes are dry.  Eyes:     Extraocular Movements: Extraocular movements intact.  Cardiovascular:     Rate and Rhythm: Normal rate and regular rhythm.     Pulses: Normal pulses.  Pulmonary:     Effort: Pulmonary effort is normal. No respiratory distress.     Breath sounds: Normal breath sounds. No wheezing or rales.  Abdominal:     General: Bowel sounds are normal. There is no distension.     Palpations: Abdomen is soft.     Tenderness: There is no abdominal tenderness.  Musculoskeletal:     Cervical back: Normal range of motion.     Right lower leg: No edema.     Left lower leg: No edema.  Skin:    General: Skin is warm and dry.     Comments: Decreased skin turgor  Neurological:     General: No focal deficit present.     Mental Status: He is alert and oriented to person, place, and time.     Labs on Admission: I have personally reviewed following labs and imaging  studies  CBC: Recent Labs  Lab 06/29/23 1721  WBC 9.8  NEUTROABS 7.1  HGB 12.6*  HCT 38.3*  MCV 91.0  PLT 166   Basic Metabolic Panel: Recent Labs  Lab 06/29/23 1721 06/29/23 2016  NA 133* 135  K 6.1* 4.3  CL 105 108  CO2 21* 21*  GLUCOSE 105* 123*  BUN 34* 31*  CREATININE 1.61* 1.43*  CALCIUM 8.6* 7.6*  MG  --  2.0   GFR: Estimated Creatinine Clearance: 42.5 mL/min (A) (by C-G formula based on SCr of 1.43 mg/dL (H)). Liver Function Tests: Recent Labs  Lab 06/29/23 1721  AST 16  ALT 15  ALKPHOS 39  BILITOT 1.0  PROT 6.4*  ALBUMIN 3.7   No results for input(s): "LIPASE", "AMYLASE" in the last 168 hours. No results for input(s): "AMMONIA" in the last 168 hours. Coagulation Profile: No results for input(s): "INR", "PROTIME" in the last 168 hours. Cardiac Enzymes: No results for input(s): "CKTOTAL", "CKMB", "CKMBINDEX", "TROPONINI" in the last 168 hours. BNP (last 3 results) No results for input(s): "PROBNP" in the last 8760 hours. HbA1C: No results for input(s): "HGBA1C" in the last 72 hours. CBG: No results for input(s): "GLUCAP" in the last 168 hours. Lipid Profile: No results for input(s): "CHOL", "HDL", "LDLCALC", "TRIG", "CHOLHDL", "LDLDIRECT" in the last 72 hours. Thyroid Function Tests: No results for input(s): "TSH", "T4TOTAL", "FREET4", "T3FREE", "THYROIDAB" in the last 72 hours. Anemia Panel: No results for input(s): "VITAMINB12", "FOLATE", "FERRITIN", "TIBC", "IRON", "RETICCTPCT" in the last 72 hours. Urine analysis:    Component Value Date/Time   COLORURINE YELLOW 06/29/2023 2016   APPEARANCEUR CLEAR 06/29/2023 2016   LABSPEC 1.015 06/29/2023 2016   PHURINE 6.5 06/29/2023 2016   GLUCOSEU 100 (A) 06/29/2023 2016   GLUCOSEU NEGATIVE 11/07/2018 1123   HGBUR NEGATIVE 06/29/2023 2016   BILIRUBINUR NEGATIVE 06/29/2023 2016   BILIRUBINUR Negative 10/07/2018 1027   KETONESUR NEGATIVE 06/29/2023 2016   PROTEINUR NEGATIVE 06/29/2023 2016    UROBILINOGEN 0.2 11/07/2018 1123   NITRITE NEGATIVE 06/29/2023 2016   LEUKOCYTESUR NEGATIVE 06/29/2023 2016    Radiological Exams on Admission: DG Chest Port 1 View  Result Date: 06/29/2023 CLINICAL DATA:  Hypotension. EXAM: PORTABLE CHEST 1 VIEW COMPARISON:  05/15/2021 FINDINGS: Atherosclerotic calcification of the aortic arch. Mild enlargement of the cardiopericardial silhouette, without edema. Mildly prominent pericardial adipose tissues. The lungs appear otherwise clear. No blunting of the costophrenic angles. IMPRESSION: 1. Mild enlargement of the cardiopericardial silhouette, without edema. Electronically Signed   By: Gaylyn Rong M.D.   On: 06/29/2023 17:54    EKG: Independently reviewed.  Sinus rhythm with first-degree AV block.  Assessment and Plan  AKI Likely prerenal in etiology secondary to dehydration/hypotension in the setting of ACE inhibitor/diuretic use.  BUN 34, creatinine 1.6 (baseline 0.8-1.0).  Patient received 2 L IV fluids in the ED after which creatinine improved to 1.4 on repeat labs.  History of heart failure but no signs of volume overload at this time.  Will continue gentle IV fluid hydration and repeat labs in the morning.  Hold lisinopril and spironolactone.  Hyperkalemia (resolved) Likely multifactorial from dehydration and use of ACE inhibitor/potassium sparing diuretic.  Patient received IV fluids and medical treatment for hyperkalemia in the ED.  Potassium normal on repeat labs.  Continue to hold lisinopril and spironolactone at this time.  Hypotension Likely due to dehydration and use of antihypertensives/diuretic.  No signs of infection or sepsis.  PE less likely as he is chronically anticoagulated and not hypoxic.  Blood pressure improved after IV fluids.  Continue to hold antihypertensives at this time.  Mild hyponatremia Sodium improved with IV fluids, continue to monitor labs.  Mild nonobstructive CAD per LHC in 2015 Patient is not endorsing any  anginal symptoms.  Troponin negative and EKG without acute ischemic changes.  Chronic HFrEF/nonischemic cardiomyopathy Echo done in March 2023 showing EF 30 to 35%.  No signs of volume overload, he appears dehydrated.  BNP normal.  Hold diuretic.  Moderate MVR Moderate AVR Per echo done in December 2022.  Paroxysmal A-fib Currently in sinus rhythm.  Hold metoprolol in setting of hypotension.  Continue  Eliquis after pharmacy med rec is done.  Hypocalcemia? Calcium was 8.6 on initial labs and 7.6 on repeat labs after IV fluids.  Albumin 3.7.  Check ionized calcium level to confirm.  Hyperlipidemia: Check CK before ordering his home statin. Hypothyroidism GERD Pharmacy med rec pending.  DVT prophylaxis: Continue Eliquis after pharmacy med rec is done. Code Status: DNR/DNI (discussed with the patient) Family Communication: No family available at this time. Level of care: Telemetry bed Admission status: It is my clinical opinion that referral for OBSERVATION is reasonable and necessary in this patient based on the above information provided. The aforementioned taken together are felt to place the patient at high risk for further clinical deterioration. However, it is anticipated that the patient may be medically stable for discharge from the hospital within 24 to 48 hours.   John Giovanni MD [facility]  If 7PM-7AM, please contact night-coverage [email or web address]  06/29/2023, 10:47 PM

## 2023-06-29 NOTE — Progress Notes (Signed)
New Admission Note:   Arrival Method: Arrived from [facility] ED via [facility] Mental Orientation: Alert and oriented x4 Telemetry: Box #7 Assessment: Completed Skin: Intact IV: Lt AC Pain: 0/10 Tubes: [name] Safety Measures: Safety Fall Prevention Plan has been discussed.  Admission: Completed Orientation: Patient has been oriented to the room, unit and staff.  Family: Wife at bedside  Admitting MD notified via [employer] secretary. Call light has been placed within reach and bed alarm has been activated.   Heidy Mccubbin [employer], RN-BC Phone number: [phone]

## 2023-06-29 NOTE — H&P (Incomplete)
History and Physical    EDRIAN LESTAGE ZOX:[id number] DOB: 24-Sep-1941 DOA: 06/29/2023  PCP: Kirt Boys, PA-C  Patient coming from: [facility] ED  Chief Complaint: Hypotension  HPI: [name] is a 82 y.o. male with medical history significant of mild nonobstructive CAD per [facility] in 2015, chronic HFrEF/nonischemic cardiomyopathy, moderate aortic regurgitation, nonrheumatic mitral regurgitation, paroxysmal A-fib on Eliquis, hyperlipidemia, history of PE, former cigarette smoker, B12 deficiency, history of TIA, restrictive lung disease presented to ED with complaints of hypotension and fatigue.  Takes metoprolol, spironolactone, and lisinopril at home.  Noted to be hypotensive with systolic as low as 80s in the ED.  Afebrile and not tachycardic.  Not hypoxic.  Labs showing no leukocytosis, hemoglobin 12.6 (at baseline), sodium 133, potassium 6.1, BUN 34, creatinine 1.6 (baseline 0.8-1.0), troponin negative, BNP normal, lactic acid normal x 2, UA not suggestive of infection, magnesium 2.0.  Chest x-ray showing clear lungs.  Patient received albuterol, insulin/dextrose, Lokelma, and 2 L normal saline boluses in the ED.  Hyperkalemia resolved after medical treatment.  Potassium 4.3 on repeat labs and also creatinine improved to 1.4 with IV fluids.  Blood pressure improved.  Patient states he went to the gym yesterday and walked on the treadmill for close to an hour.  Afterwards he felt nauseous and when he checked his blood pressure it was low with systolic in the low 90s.  States normally his systolic blood pressure is 120-130.  He has continued to feel weak since then and today his blood pressure was even lower with systolic in the 80s.  He has not been drinking enough fluids and takes 3 different antihypertensives including metoprolol, lisinopril, and spironolactone.  He did take his blood pressure medications this morning.  Denies dizziness or loss of consciousness.  Denies fevers, cough,  shortness of breath, chest pain, vomiting, abdominal pain, or diarrhea.  Review of Systems:  Review of Systems  All other systems reviewed and are negative.   Past Medical History:  Diagnosis Date  . Abdominal wall defect, acquired   . Blockage of coronary artery of heart (HCC)    30%  . Hearing aid worn   . Insulin resistance   . LV dysfunction    reduction  . Osteoarthritis   . Pernicious anemia 03/03/2021  . Pulmonary embolism [facility])     Past Surgical History:  Procedure Laterality Date  . ACNE CYST REMOVAL  2012   Back   . APPENDECTOMY    . CARDIAC CATHETERIZATION    . COLECTOMY  11/2013   [facility], 2nd to diverticulitis  . RT hip replacement    . TONSILLECTOMY       reports that he quit smoking about 37 years ago. His smoking use included cigarettes. He has a 15.00 pack-year smoking history. He has never used smokeless tobacco. He reports that he does not drink alcohol and does not use drugs.  Allergies  Allergen Reactions  . Tamsulosin Hcl Other (See Comments)    Pre-syncope   . Flagyl [Metronidazole] Nausea And Vomiting    States IV form causes to allergy  . Lipitor [Atorvastatin] Other (See Comments)    Myalgia  . Tramadol Other (See Comments)    Dizziness    Family History  Problem Relation Age of Onset  . Heart disease Mother        pacemaker    Prior to Admission medications   Medication Sig Start Date End Date Taking? Authorizing Provider  apixaban (ELIQUIS) 5 MG TABS  tablet Take 5 mg by mouth 2 (two) times daily.  04/08/17   [provider]  ciprofloxacin-dexamethasone (CIPRODEX) OTIC suspension SMARTSIG:In Ear(s) 07/02/21   [provider]  EUTHYROX 75 MCG tablet TAKE 1 TABLET BY MOUTH ONCE DAILY IN THE MORNING ON AN EMPTY STOMACH. NEEDS TO MAKE AN APPOINTMENT 08/21/21   Jarold Motto, PA  hydrocortisone 2.5 % cream Apply topically. 01/24/21   [provider]  meclizine (ANTIVERT) 25 MG tablet TAKE 1 TABLET BY MOUTH AS  NEEDED 10/20/21   Jarold Motto, PA  meloxicam [facility]) 15 MG tablet Take 1 tablet by mouth once daily 01/23/22   Jarold Motto, PA  metoprolol tartrate (LOPRESSOR) 50 MG tablet Take 50 mg by mouth 2 (two) times daily.    [provider]  Multiple Vitamin (MULTIVITAMIN ADULT PO) Take 2 tablets by mouth daily. 08/28/20   [provider]  omeprazole (PRILOSEC) 40 MG capsule SMARTSIG:1 Capsule(s) By Mouth Every Evening 01/08/21   [provider]  simvastatin (ZOCOR) 40 MG tablet Take 1 tablet by mouth once daily 10/20/22   Jarold Motto, [place]    Physical Exam: Vitals:   06/29/23 1800 06/29/23 1900 06/29/23 2023 06/29/23 2115  BP: 132/60 (!) 117/49  (!) 105/51  Pulse: 72 71  96  Resp: 13 11  18   Temp:   (!) 97.5 F (36.4 C) 97.7 F (36.5 C)  TempSrc:      SpO2: 100% 100%  99%  Weight:      Height:        Physical Exam Vitals reviewed.  Constitutional:      General: He is not in acute distress. HENT:     Head: Normocephalic and atraumatic.     Mouth/Throat:     Mouth: Mucous membranes are dry.  Eyes:     Extraocular Movements: Extraocular movements intact.  Cardiovascular:     Rate and Rhythm: Normal rate and regular rhythm.     Pulses: Normal pulses.  Pulmonary:     Effort: Pulmonary effort is normal. No respiratory distress.     Breath sounds: Normal breath sounds. No wheezing or rales.  Abdominal:     General: Bowel sounds are normal. There is no distension.     Palpations: Abdomen is soft.     Tenderness: There is no abdominal tenderness.  Musculoskeletal:     Cervical back: Normal range of motion.     Right lower leg: No edema.     Left lower leg: No edema.  Skin:    General: Skin is warm and dry.     Comments: Decreased skin turgor  Neurological:     General: No focal deficit present.     Mental Status: He is alert and oriented to person, place, and time.     Labs on Admission: I have personally reviewed following labs and imaging  studies  CBC: Recent Labs  Lab 06/29/23 1721  WBC 9.8  NEUTROABS 7.1  HGB 12.6*  HCT 38.3*  MCV 91.0  PLT 166   Basic Metabolic Panel: Recent Labs  Lab 06/29/23 1721 06/29/23 2016  NA 133* 135  K 6.1* 4.3  CL 105 108  CO2 21* 21*  GLUCOSE 105* 123*  BUN 34* 31*  CREATININE 1.61* 1.43*  CALCIUM 8.6* 7.6*  MG  --  2.0   GFR: Estimated Creatinine Clearance: 42.5 mL/min (A) (by C-G formula based on SCr of 1.43 mg/dL (H)). Liver Function Tests: Recent Labs  Lab 06/29/23 1721  AST 16  ALT 15  ALKPHOS 39  BILITOT 1.0  PROT 6.4*  ALBUMIN 3.7   No results for input(s): "LIPASE", "AMYLASE" in the last 168 hours. No results for input(s): "AMMONIA" in the last 168 hours. Coagulation Profile: No results for input(s): "INR", "PROTIME" in the last 168 hours. Cardiac Enzymes: No results for input(s): "CKTOTAL", "CKMB", "CKMBINDEX", "TROPONINI" in the last 168 hours. BNP (last 3 results) No results for input(s): "PROBNP" in the last 8760 hours. HbA1C: No results for input(s): "HGBA1C" in the last 72 hours. CBG: No results for input(s): "GLUCAP" in the last 168 hours. Lipid Profile: No results for input(s): "CHOL", "HDL", "LDLCALC", "TRIG", "CHOLHDL", "LDLDIRECT" in the last 72 hours. Thyroid Function Tests: No results for input(s): "TSH", "T4TOTAL", "FREET4", "T3FREE", "THYROIDAB" in the last 72 hours. Anemia Panel: No results for input(s): "VITAMINB12", "FOLATE", "FERRITIN", "TIBC", "IRON", "RETICCTPCT" in the last 72 hours. Urine analysis:    Component Value Date/Time   COLORURINE YELLOW 06/29/2023 2016   APPEARANCEUR CLEAR 06/29/2023 2016   LABSPEC 1.015 06/29/2023 2016   PHURINE 6.5 06/29/2023 2016   GLUCOSEU 100 (A) 06/29/2023 2016   GLUCOSEU NEGATIVE 11/07/2018 1123   HGBUR NEGATIVE 06/29/2023 2016   BILIRUBINUR NEGATIVE 06/29/2023 2016   BILIRUBINUR Negative 10/07/2018 1027   KETONESUR NEGATIVE 06/29/2023 2016   PROTEINUR NEGATIVE 06/29/2023 2016    UROBILINOGEN 0.2 11/07/2018 1123   NITRITE NEGATIVE 06/29/2023 2016   LEUKOCYTESUR NEGATIVE 06/29/2023 2016    Radiological Exams on Admission: DG Chest Port 1 View  Result Date: 06/29/2023 CLINICAL DATA:  Hypotension. EXAM: PORTABLE CHEST 1 VIEW COMPARISON:  05/15/2021 FINDINGS: Atherosclerotic calcification of the aortic arch. Mild enlargement of the cardiopericardial silhouette, without edema. Mildly prominent pericardial adipose tissues. The lungs appear otherwise clear. No blunting of the costophrenic angles. IMPRESSION: 1. Mild enlargement of the cardiopericardial silhouette, without edema. Electronically Signed   By: Gaylyn Rong M.D.   On: 06/29/2023 17:54    EKG: Independently reviewed.  Sinus rhythm with first-degree AV block.  Assessment and Plan  AKI Likely prerenal in etiology secondary to dehydration/hypotension in the setting of ACE inhibitor/diuretic use.  BUN 34, creatinine 1.6 (baseline 0.8-1.0).  Patient received 2 L IV fluids in the ED after which creatinine improved to 1.4 on repeat labs.  History of heart failure but no signs of volume overload at this time.  Will continue gentle IV fluid hydration and repeat labs in the morning.  Hold lisinopril and spironolactone.  Hyperkalemia (resolved) Likely multifactorial from dehydration and use of ACE inhibitor/potassium sparing diuretic.  Patient received IV fluids and medical treatment for hyperkalemia in the ED.  Potassium normal on repeat labs.  Continue to hold lisinopril and spironolactone at this time.  Hypotension Likely due to dehydration and use of antihypertensives/diuretic.  No signs of infection or sepsis.  PE less likely as he is chronically anticoagulated and not hypoxic.  Blood pressure improved after IV fluids.  Continue to hold antihypertensives at this time.  Mild hyponatremia Sodium improved with IV fluids, continue to monitor labs.  Mild nonobstructive CAD per LHC in 2015 Patient is not endorsing any  anginal symptoms.  Troponin negative and EKG without acute ischemic changes.  Chronic HFrEF/nonischemic cardiomyopathy Echo done in March 2023 showing EF 30 to 35%.  No signs of volume overload, he appears dehydrated.  BNP normal.  Hold diuretic.  Moderate MVR Moderate AVR Per echo done in December 2022.  Paroxysmal A-fib Currently in sinus rhythm.  Hold metoprolol to avoid hypotension.  On Eliquis  Hyperlipidemia  Hypocalcemia? Calcium was 8.6 on initial labs and 7.6 on repeat labs after IV fluids.  Albumin 3.7.  Check ionized calcium level to confirm.   DVT prophylaxis: {Blank single:19197::"Lovenox","SQ Heparin","IV heparin gtt","Xarelto","Eliquis","Coumadin","SCDs","***"} Code Status: {Blank single:19197::"Full Code","DNR","DNR/DNI","Comfort Care","***"} Family Communication: ***  Consults called: ***  Level of care: {Blank single:19197::"Med-Surg","Telemetry bed","Progressive Care Unit","Step Down Unit"} Admission status: *** Time Spent: 75+ minutes***  John Giovanni MD [facility]  If 7PM-7AM, please contact night-coverage [email or web address]  06/29/2023, 10:47 PM

## 2023-06-29 NOTE — ED Provider Notes (Signed)
[place] [facility] Provider Note   CSN: [id number] Arrival date & time: 06/29/23  1611     History  Chief Complaint  Patient presents with   Hypotension    [name] is a 82 y.o. male.  82 yo M with a chief complaint of feeling fatigued.  This been going on for a couple days.  He got better over the afternoon yesterday.  Had exercise and then felt very tired afterwards.  He denies any change to his medications.  Denies cough congestion or fever.  Denies urinary symptoms.  Has been eating and drinking normally.  He denies rash.        Home Medications Prior to Admission medications   Medication Sig Start Date End Date Taking? Authorizing Provider  apixaban (ELIQUIS) 5 MG TABS tablet Take 5 mg by mouth 2 (two) times daily.  04/08/17   [provider]  ciprofloxacin-dexamethasone (CIPRODEX) OTIC suspension SMARTSIG:In Ear(s) 07/02/21   [provider]  EUTHYROX 75 MCG tablet TAKE 1 TABLET BY MOUTH ONCE DAILY IN THE MORNING ON AN EMPTY STOMACH. NEEDS TO MAKE AN APPOINTMENT 08/21/21   Jarold Motto, PA  hydrocortisone 2.5 % cream Apply topically. 01/24/21   [provider]  meclizine (ANTIVERT) 25 MG tablet TAKE 1 TABLET BY MOUTH AS NEEDED 10/20/21   Jarold Motto, PA  meloxicam [facility]) 15 MG tablet Take 1 tablet by mouth once daily 01/23/22   Jarold Motto, PA  metoprolol tartrate (LOPRESSOR) 50 MG tablet Take 50 mg by mouth 2 (two) times daily.    [provider]  Multiple Vitamin (MULTIVITAMIN ADULT PO) Take 2 tablets by mouth daily. 08/28/20   [provider]  omeprazole (PRILOSEC) 40 MG capsule SMARTSIG:1 Capsule(s) By Mouth Every Evening 01/08/21   [provider]  simvastatin (ZOCOR) 40 MG tablet Take 1 tablet by mouth once daily 10/20/22   Jarold Motto, PA      Allergies    Tamsulosin hcl, Flagyl [metronidazole], Lipitor [atorvastatin], and Tramadol    Review of Systems    Review of Systems  Physical Exam Updated Vital Signs BP 132/60   Pulse 72   Temp 97.6 F (36.4 C) (Oral)   Resp 13   Ht 5' 10.5" (1.791 m)   Wt 83.9 kg   SpO2 100%   BMI 26.17 kg/m  Physical Exam Vitals and nursing note reviewed.  Constitutional:      Appearance: He is well-developed.  HENT:     Head: Normocephalic and atraumatic.  Eyes:     Pupils: Pupils are equal, round, and reactive to light.  Neck:     Vascular: No JVD.  Cardiovascular:     Rate and Rhythm: Normal rate and regular rhythm.     Heart sounds: No murmur heard.    No friction rub. No gallop.  Pulmonary:     Effort: No respiratory distress.     Breath sounds: No wheezing.  Abdominal:     General: There is no distension.     Tenderness: There is no abdominal tenderness. There is no guarding or rebound.  Musculoskeletal:        General: Normal range of motion.     Cervical back: Normal range of motion and neck supple.  Skin:    Coloration: Skin is not pale.     Findings: No rash.  Neurological:     Mental Status: He is alert and oriented to person, place, and time.  Psychiatric:  Behavior: Behavior normal.     ED Results / Procedures / Treatments   Labs (all labs ordered are listed, but only abnormal results are displayed) Labs Reviewed  CBC WITH DIFFERENTIAL/PLATELET - Abnormal; Notable for the following components:      Result Value   RBC 4.21 (*)    Hemoglobin 12.6 (*)    HCT 38.3 (*)    All other components within normal limits  COMPREHENSIVE METABOLIC PANEL - Abnormal; Notable for the following components:   Sodium 133 (*)    Potassium 6.1 (*)    CO2 21 (*)    Glucose, Bld 105 (*)    BUN 34 (*)    Creatinine, Ser 1.61 (*)    Calcium 8.6 (*)    Total Protein 6.4 (*)    GFR, Estimated 43 (*)    All other components within normal limits  BRAIN NATRIURETIC PEPTIDE  LACTIC ACID, PLASMA  URINALYSIS, ROUTINE W REFLEX MICROSCOPIC  LACTIC ACID, PLASMA  BASIC METABOLIC PANEL   TROPONIN I (HIGH SENSITIVITY)    EKG EKG Interpretation Date/Time:  Tuesday June 29 2023 17:25:14 EDT Ventricular Rate:  70 PR Interval:  217 QRS Duration:  93 QT Interval:  376 QTC Calculation: 406 R Axis:   11  Text Interpretation: Sinus rhythm Borderline prolonged PR interval Nonspecific T abnormalities, lateral leads No significant change since last tracing Confirmed by Melene Plan [phone]) on 06/29/2023 5:30:03 PM  Radiology DG Chest Port 1 View  Result Date: 06/29/2023 CLINICAL DATA:  Hypotension. EXAM: PORTABLE CHEST 1 VIEW COMPARISON:  05/15/2021 FINDINGS: Atherosclerotic calcification of the aortic arch. Mild enlargement of the cardiopericardial silhouette, without edema. Mildly prominent pericardial adipose tissues. The lungs appear otherwise clear. No blunting of the costophrenic angles. IMPRESSION: 1. Mild enlargement of the cardiopericardial silhouette, without edema. Electronically Signed   By: Gaylyn Rong M.D.   On: 06/29/2023 17:54    Procedures .Critical Care  Performed by: Melene Plan, DO Authorized by: Melene Plan, DO   Critical care provider statement:    Critical care time (minutes):  80   Critical care time was exclusive of:  Separately billable procedures and treating other patients   Critical care was time spent personally by me on the following activities:  Development of treatment plan with patient or surrogate, discussions with consultants, evaluation of patient's response to treatment, examination of patient, ordering and review of laboratory studies, ordering and review of radiographic studies, ordering and performing treatments and interventions, pulse oximetry, re-evaluation of patient's condition and review of old charts   Care discussed with: admitting provider       Medications Ordered in ED Medications  sodium zirconium cyclosilicate (LOKELMA) packet 10 g (has no administration in time range)  insulin aspart (novoLOG) injection 5 Units (has no  administration in time range)    And  dextrose 50 % solution 50 mL (has no administration in time range)  dextrose 10 % infusion (has no administration in time range)  sodium chloride 0.9 % bolus 1,000 mL (has no administration in time range)  0.9 %  sodium chloride infusion (has no administration in time range)  sodium chloride 0.9 % bolus 1,000 mL (0 mLs Intravenous Stopped 06/29/23 1835)  albuterol (PROVENTIL) (2.5 MG/3ML) 0.083% nebulizer solution 10 mg (10 mg Nebulization Given 06/29/23 1820)    ED Course/ Medical Decision Making/ A&P  Medical Decision Making Amount and/or Complexity of Data Reviewed Labs: ordered. Radiology: ordered. ECG/medicine tests: ordered.  Risk OTC drugs. Prescription drug management.   82 yo M with a chief complaints of hypotension.  The patient tells me that has been checking his blood pressure at home and noted that it was low.  He was able to do his normal exercise routine and then afterwards was not feeling well and laid around most of the day.  His blood pressures been mostly in the 80s.  Typically it is in the 110 range.  He denies any obvious infectious symptoms.  Perhaps this is due to hypovolemia as the patient takes a diuretic and has been quite hot outside for the past week or so.  Will give a bolus of IV fluids blood work chest x-ray UA.  Reassess.  Patient has a AKI with hyperkalemia.  On further discussion the patient is on spironolactone as well as lisinopril.  This combined with dehydration I think is the most likely explanation for his hyperkalemia.  EKG without any concerning findings but will temporize.  Will discuss with medicine for admission.  The patients results and plan were reviewed and discussed.   Any x-rays performed were independently reviewed by myself.   Differential diagnosis were considered with the presenting HPI.  Medications  sodium zirconium cyclosilicate (LOKELMA) packet 10 g (has no  administration in time range)  insulin aspart (novoLOG) injection 5 Units (has no administration in time range)    And  dextrose 50 % solution 50 mL (has no administration in time range)  dextrose 10 % infusion (has no administration in time range)  sodium chloride 0.9 % bolus 1,000 mL (has no administration in time range)  0.9 %  sodium chloride infusion (has no administration in time range)  sodium chloride 0.9 % bolus 1,000 mL (0 mLs Intravenous Stopped 06/29/23 1835)  albuterol (PROVENTIL) (2.5 MG/3ML) 0.083% nebulizer solution 10 mg (10 mg Nebulization Given 06/29/23 1820)    Vitals:   06/29/23 1715 06/29/23 1730 06/29/23 1745 06/29/23 1800  BP: (!) 124/55 (!) 107/59 (!) 97/51 132/60  Pulse: 74 70 63 72  Resp:  16 12 13   Temp:      TempSrc:      SpO2: 97% 98% 98% 100%  Weight:      Height:        Final diagnoses:  Hypotension due to hypovolemia  AKI (acute kidney injury) (HCC)  Hyperkalemia    Admission/ observation were discussed with the admitting physician, patient and/or family and they are comfortable with the plan.          Final Clinical Impression(s) / ED Diagnoses Final diagnoses:  Hypotension due to hypovolemia  AKI (acute kidney injury) (HCC)  Hyperkalemia    Rx / DC Orders ED Discharge Orders     None         Melene Plan, DO 06/29/23 1840

## 2023-06-29 NOTE — ED Triage Notes (Signed)
Pt reports he has taken his BP multiple times today and his systolic has been in the 80s. Lowest was 83 systolic. He reports he took his daily dose of Metoprolol and Lisinopril today. He reports he feels fine, just a little tired. Denies any pain. BP in triage is 115/66, HR-86.

## 2023-06-30 DIAGNOSIS — I5022 Chronic systolic (congestive) heart failure: Secondary | ICD-10-CM

## 2023-06-30 DIAGNOSIS — E875 Hyperkalemia: Secondary | ICD-10-CM

## 2023-06-30 DIAGNOSIS — N179 Acute kidney failure, unspecified: Secondary | ICD-10-CM | POA: Diagnosis not present

## 2023-06-30 LAB — BASIC METABOLIC PANEL
Anion gap: 4 — ABNORMAL LOW (ref 5–15)
Anion gap: 6 (ref 5–15)
BUN: 26 mg/dL — ABNORMAL HIGH (ref 8–23)
BUN: 23 mg/dL (ref 8–23)
CO2: 20 mmol/L — ABNORMAL LOW (ref 22–32)
CO2: 20 mmol/L — ABNORMAL LOW (ref 22–32)
Calcium: 8 mg/dL — ABNORMAL LOW (ref 8.9–10.3)
Calcium: 8.5 mg/dL — ABNORMAL LOW (ref 8.9–10.3)
Chloride: 112 mmol/L — ABNORMAL HIGH (ref 98–111)
Chloride: 114 mmol/L — ABNORMAL HIGH (ref 98–111)
Creatinine, Ser: 1.39 mg/dL — ABNORMAL HIGH (ref 0.61–1.24)
Creatinine, Ser: 1.21 mg/dL (ref 0.61–1.24)
GFR, Estimated: 51 mL/min — ABNORMAL LOW (ref >60)
GFR, Estimated: >60 mL/min (ref >60)
Glucose, Bld: 216 mg/dL — ABNORMAL HIGH (ref 70–99)
Glucose, Bld: 143 mg/dL — ABNORMAL HIGH (ref 70–99)
Potassium: 4.9 mmol/L (ref 3.5–5.1)
Potassium: 5.1 mmol/L (ref 3.5–5.1)
Sodium: 136 mmol/L (ref 135–145)
Sodium: 140 mmol/L (ref 135–145)

## 2023-06-30 LAB — CK: Total CK: 41 U/L — ABNORMAL LOW (ref 49–397)

## 2023-06-30 MED ORDER — LEVOTHYROXINE SODIUM 75 MCG PO TABS
75.0000 ug | ORAL_TABLET | Freq: Every day | ORAL | Status: DC
  Administered 2023-06-30: 75 ug via ORAL
  Filled 2023-06-30: qty 1

## 2023-06-30 MED ORDER — ENSURE ENLIVE PO LIQD: 237.0000 mL | Freq: Two times a day (BID) | ORAL | Status: DC

## 2023-06-30 MED ORDER — ACETAMINOPHEN 650 MG RE SUPP: 650.0000 mg | Freq: Four times a day (QID) | RECTAL | Status: DC | PRN

## 2023-06-30 MED ORDER — METOPROLOL SUCCINATE ER 25 MG PO TB24: 25.0000 mg | ORAL_TABLET | Freq: Every day | ORAL | 1 refills | Status: DC

## 2023-06-30 MED ORDER — APIXABAN 5 MG PO TABS
5.0000 mg | ORAL_TABLET | Freq: Two times a day (BID) | ORAL | Status: DC
  Administered 2023-06-30: 5 mg via ORAL
  Filled 2023-06-30: qty 1

## 2023-06-30 MED ORDER — SODIUM CHLORIDE 0.9 % IV SOLN: INTRAVENOUS | Status: AC

## 2023-06-30 MED ORDER — ACETAMINOPHEN 325 MG PO TABS: 650.0000 mg | ORAL_TABLET | Freq: Four times a day (QID) | ORAL | Status: DC | PRN

## 2023-06-30 NOTE — Care Management Obs Status (Signed)
MEDICARE OBSERVATION STATUS NOTIFICATION   Patient Details  Name: [name] MRN: [id number] Date of Birth: [birth year]   Medicare Observation Status Notification Given:  Yes    Tom-Johnson, Hershal Coria, RN 06/30/2023, 11:35 AM

## 2023-06-30 NOTE — Discharge Summary (Signed)
Physician Discharge Summary  [name]:[id number] DOB: 1941/12/02 DOA: 06/29/2023  PCP: Kirt Boys, PA-C  Admit date: 06/29/2023 Discharge date: 06/30/2023  Admitted From: home Discharge disposition: home   Recommendations for Outpatient Follow-Up:   Adjusted BP meds-- check daily and resume others in a staggered way   Discharge Diagnosis:   Principal Problem:   AKI (acute kidney injury) (HCC) Active Problems:   Hypothyroidism   HYPERCHOLESTEROLEMIA   Paroxysmal atrial fibrillation (HCC)   CAD (coronary artery disease)   Hypotension   Hyperkalemia   Chronic systolic CHF (congestive heart failure) (HCC)    Discharge Condition: Improved.  Diet recommendation:   Regular.  Wound care: None.  Code status: Full.   History of Present Illness:   MIKAELE Petersen is a 82 y.o. male with medical history significant of mild nonobstructive CAD per [facility] in 2015, chronic HFrEF/nonischemic cardiomyopathy, moderate aortic regurgitation, nonrheumatic mitral regurgitation, paroxysmal A-fib on Eliquis, hyperlipidemia, hypothyroidism, history of PE, former cigarette smoker, B12 deficiency, history of TIA, restrictive lung disease presented to ED with complaints of hypotension and fatigue.  Takes metoprolol, spironolactone, and lisinopril at home.  Noted to be hypotensive with systolic as low as 80s in the ED.  Afebrile and not tachycardic.  Not hypoxic.  Labs showing no leukocytosis, hemoglobin 12.6 (at baseline), sodium 133, potassium 6.1, BUN 34, creatinine 1.6 (baseline 0.8-1.0), troponin negative, BNP normal, lactic acid normal x 2, UA not suggestive of infection, magnesium 2.0.  Chest x-ray showing clear lungs.  Patient received albuterol, insulin/dextrose, Lokelma, and 2 L normal saline boluses in the ED.  Hyperkalemia resolved after medical treatment.  Potassium 4.3 on repeat labs and also creatinine improved to 1.4 with IV fluids.  Blood pressure improved.    Patient states he went to the gym yesterday and walked on the treadmill for close to an hour.  Afterwards he felt nauseous and when he checked his blood pressure it was low with systolic in the low 90s.  States normally his systolic blood pressure is 120-130.  He has continued to feel weak since then and today his blood pressure was even lower with systolic in the 80s.  He has not been drinking enough fluids and takes 3 different antihypertensives including metoprolol, lisinopril, and spironolactone.  He did take his blood pressure medications this morning.  Denies dizziness or loss of consciousness.  Denies fevers, cough, shortness of breath, chest pain, vomiting, abdominal pain, or diarrhea.   Hospital Course by Problem:   AKI Likely prerenal in etiology secondary to dehydration/hypotension in the setting of ACE inhibitor/diuretic use.  -resolved with IVF -walked without weakness   Hyperkalemia (resolved) Likely multifactorial from dehydration and use of ACE inhibitor/potassium sparing diuretic.  Patient received IV fluids and medical treatment for hyperkalemia in the ED.  Potassium normal on repeat labs.  Continue to hold lisinopril and spironolactone at this time.   Hypotension Likely due to dehydration and use of antihypertensives/diuretic.  No signs of infection or sepsis.     Mild hyponatremia Sodium improved with IV fluids   Mild nonobstructive CAD per [facility] in 2015 Patient is not endorsing any anginal symptoms.  Troponin negative and EKG without acute ischemic changes.   Chronic HFrEF/nonischemic cardiomyopathy Echo done in March 2023 showing EF 30 to 35%.  No signs of volume overload, he appears dehydrated.  BNP normal.  Hold diuretic.   Moderate MVR Moderate AVR Per echo done in December 2022.   Paroxysmal A-fib -resume  home meds   Hypocalcemia? Calcium was 8.6 on initial labs and 7.6 on repeat labs after IV fluids.  Albumin 3.7.  Check ionized calcium level to confirm.    Hyperlipidemia:  Hypothyroidism GERD -resume home meds    Medical Consultants:      Discharge Exam:   Vitals:   06/30/23 0450 06/30/23 1014  BP: (!) 109/53 125/60  Pulse: 72 70  Resp: 17 18  Temp: 98 F (36.7 C) 97.7 F (36.5 C)  SpO2: 98% 99%   Vitals:   06/29/23 2115 06/29/23 2316 06/30/23 0450 06/30/23 1014  BP: (!) 105/51 (!) 99/57 (!) 109/53 125/60  Pulse: 96 86 72 70  Resp: 18 16 17 18   Temp: 97.7 F (36.5 C) 98.1 F (36.7 C) 98 F (36.7 C) 97.7 F (36.5 C)  TempSrc:    Axillary  SpO2: 99% 100% 98% 99%  Weight:      Height:        General exam: Appears calm and comfortable.    The results of significant diagnostics from this hospitalization (including imaging, microbiology, ancillary and laboratory) are listed below for reference.     Procedures and Diagnostic Studies:   DG Chest Port 1 View  Result Date: 06/29/2023 CLINICAL DATA:  Hypotension. EXAM: PORTABLE CHEST 1 VIEW COMPARISON:  05/15/2021 FINDINGS: Atherosclerotic calcification of the aortic arch. Mild enlargement of the cardiopericardial silhouette, without edema. Mildly prominent pericardial adipose tissues. The lungs appear otherwise clear. No blunting of the costophrenic angles. IMPRESSION: 1. Mild enlargement of the cardiopericardial silhouette, without edema. Electronically Signed   By: Gaylyn Rong M.D.   On: 06/29/2023 17:54     Labs:   Basic Metabolic Panel: Recent Labs  Lab 06/29/23 1721 06/29/23 2016 06/30/23 0134 06/30/23 0929  NA 133* 135 136 140  K 6.1* 4.3 4.9 5.1  CL 105 108 112* 114*  CO2 21* 21* 20* 20*  GLUCOSE 105* 123* 216* 143*  BUN 34* 31* 26* 23  CREATININE 1.61* 1.43* 1.39* 1.21  CALCIUM 8.6* 7.6* 8.0* 8.5*  MG  --  2.0  --   --    GFR Estimated Creatinine Clearance: 50.3 mL/min (by C-G formula based on SCr of 1.21 mg/dL). Liver Function Tests: Recent Labs  Lab 06/29/23 1721  AST 16  ALT 15  ALKPHOS 39  BILITOT 1.0  PROT 6.4*  ALBUMIN 3.7    No results for input(s): "LIPASE", "AMYLASE" in the last 168 hours. No results for input(s): "AMMONIA" in the last 168 hours. Coagulation profile No results for input(s): "INR", "PROTIME" in the last 168 hours.  CBC: Recent Labs  Lab 06/29/23 1721  WBC 9.8  NEUTROABS 7.1  HGB 12.6*  HCT 38.3*  MCV 91.0  PLT 166   Cardiac Enzymes: Recent Labs  Lab 06/30/23 0134  CKTOTAL 41*   BNP: Invalid input(s): "POCBNP" CBG: No results for input(s): "GLUCAP" in the last 168 hours. D-Dimer No results for input(s): "DDIMER" in the last 72 hours. Hgb A1c No results for input(s): "HGBA1C" in the last 72 hours. Lipid Profile No results for input(s): "CHOL", "HDL", "LDLCALC", "TRIG", "CHOLHDL", "LDLDIRECT" in the last 72 hours. Thyroid function studies No results for input(s): "TSH", "T4TOTAL", "T3FREE", "THYROIDAB" in the last 72 hours.  Invalid input(s): "FREET3" Anemia work up No results for input(s): "VITAMINB12", "FOLATE", "FERRITIN", "TIBC", "IRON", "RETICCTPCT" in the last 72 hours. Microbiology No results found for this or any previous visit (from the past 240 hour(s)).   Discharge Instructions:   Discharge Instructions  Diet - low sodium heart healthy   Complete by: As directed    Discharge instructions   Complete by: As directed    Holding your aldactone and lisinopril for now as BP will not support-- check BP daily and if consistently the top number is >140 can resume aldactone.  If BP continues to be elevated, may need to resume lisinopril   Increase activity slowly   Complete by: As directed       Allergies as of 06/30/2023       Reactions   Tamsulosin Hcl Other (See Comments)   Pre-syncope   Flagyl [metronidazole] Nausea And Vomiting   States IV form causes to allergy   Lipitor [atorvastatin] Other (See Comments)   Myalgia   Tramadol Other (See Comments)   Dizziness        Medication List     STOP taking these medications     ciprofloxacin-dexamethasone OTIC suspension Commonly known as: CIPRODEX   lisinopril 20 MG tablet Commonly known as: ZESTRIL   meloxicam 15 MG tablet Commonly known as: MOBIC   metoprolol tartrate 50 MG tablet Commonly known as: LOPRESSOR   spironolactone 25 MG tablet Commonly known as: ALDACTONE       TAKE these medications    apixaban 5 MG Tabs tablet Commonly known as: ELIQUIS Take 5 mg by mouth 2 (two) times daily.   Euthyrox 75 MCG tablet Generic drug: levothyroxine TAKE 1 TABLET BY MOUTH ONCE DAILY IN THE MORNING ON AN EMPTY STOMACH. NEEDS TO MAKE AN APPOINTMENT What changed: See the new instructions.   hydrocortisone 2.5 % cream Apply 1 Application topically daily as needed (ezcema).   ketoconazole 2 % shampoo Commonly known as: NIZORAL Apply 1 Application topically 2 (two) times a week.   meclizine 25 MG tablet Commonly known as: ANTIVERT TAKE 1 TABLET BY MOUTH AS NEEDED What changed:  when to take this reasons to take this   metoprolol succinate 25 MG 24 hr tablet Commonly known as: Toprol XL Take 1 tablet (25 mg total) by mouth daily.   omeprazole 40 MG capsule Commonly known as: PRILOSEC Take 40 mg by mouth daily.   simvastatin 40 MG tablet Commonly known as: ZOCOR Take 1 tablet by mouth once daily What changed: when to take this          Time coordinating discharge: 45 min  Signed:  Joseph Art DO  [facility] 06/30/2023, 11:06 AM

## 2023-06-30 NOTE — TOC Transition Note (Signed)
Transition of Care [facility]) - CM/SW Discharge Note   Patient Details  Name: [name] MRN: [id number] Date of Birth: 05-Nov-1941  [facility] CM/SW Contact:  Tom-Johnson, Hershal Coria, RN Phone Number: 06/30/2023, 11:33 AM   Clinical Narrative:     Patient is scheduled for discharge today.  Hospital f/u and discharge instructions on AVS. No TOC needs or recommendations noted. Wife, Lucendia Herrlich to transport at discharge.  No further TOC needs noted.     Final next level of care: Home/Self Care Barriers to Discharge: Barriers Resolved   Patient Goals and CMS Choice CMS Medicare.gov Compare Post Acute Care list provided to:: Patient Choice offered to / list presented to : NA  Discharge Placement                  Patient to be transferred to facility by: Wife Name of family member notified: [facility]    Discharge Plan and Services Additional resources added to the After Visit Summary for                  DME Arranged: [name] DME Agency: NA       HH Arranged: NA HH Agency: NA        Social Determinants of Health (SDOH) Interventions SDOH Screenings   Food Insecurity: No Food Insecurity (06/29/2023)  Housing: Low Risk  (06/29/2023)  Transportation Needs: No Transportation Needs (06/29/2023)  Utilities: Not At Risk (06/29/2023)  Depression (PHQ2-9): Low Risk  (08/04/2019)  Tobacco Use: Medium Risk (06/29/2023)     Readmission Risk Interventions     No data to display

## 2023-07-01 LAB — CALCIUM, IONIZED: Calcium, Ionized, Serum: 4.7 mg/dL (ref 4.5–5.6)

## 2024-01-26 ENCOUNTER — Other Ambulatory Visit: Payer: Self-pay

## 2024-01-26 ENCOUNTER — Encounter: Payer: Self-pay | Admitting: Family

## 2024-01-26 ENCOUNTER — Emergency Department (HOSPITAL_BASED_OUTPATIENT_CLINIC_OR_DEPARTMENT_OTHER): Payer: HMO

## 2024-01-26 ENCOUNTER — Inpatient Hospital Stay (HOSPITAL_BASED_OUTPATIENT_CLINIC_OR_DEPARTMENT_OTHER)
Admission: EM | Admit: 2024-01-26 | Discharge: 2024-01-29 | DRG: 193 | Disposition: A | Discharge status: Home Health | Payer: HMO | Attending: Internal Medicine | Admitting: Internal Medicine

## 2024-01-26 ENCOUNTER — Encounter (HOSPITAL_BASED_OUTPATIENT_CLINIC_OR_DEPARTMENT_OTHER): Payer: Self-pay | Admitting: Emergency Medicine

## 2024-01-26 DIAGNOSIS — E039 Hypothyroidism, unspecified: Secondary | ICD-10-CM | POA: Diagnosis present

## 2024-01-26 DIAGNOSIS — N179 Acute kidney failure, unspecified: Secondary | ICD-10-CM | POA: Diagnosis present

## 2024-01-26 DIAGNOSIS — J9601 Acute respiratory failure with hypoxia: Secondary | ICD-10-CM | POA: Diagnosis not present

## 2024-01-26 DIAGNOSIS — R059 Cough, unspecified: Secondary | ICD-10-CM | POA: Diagnosis not present

## 2024-01-26 DIAGNOSIS — Z7901 Long term (current) use of anticoagulants: Secondary | ICD-10-CM

## 2024-01-26 DIAGNOSIS — J159 Unspecified bacterial pneumonia: Secondary | ICD-10-CM | POA: Diagnosis present

## 2024-01-26 DIAGNOSIS — Z888 Allergy status to other drugs, medicaments and biological substances status: Secondary | ICD-10-CM

## 2024-01-26 DIAGNOSIS — E871 Hypo-osmolality and hyponatremia: Secondary | ICD-10-CM | POA: Diagnosis present

## 2024-01-26 DIAGNOSIS — J1008 Influenza due to other identified influenza virus with other specified pneumonia: Principal | ICD-10-CM | POA: Diagnosis present

## 2024-01-26 DIAGNOSIS — Z96641 Presence of right artificial hip joint: Secondary | ICD-10-CM | POA: Diagnosis present

## 2024-01-26 DIAGNOSIS — J101 Influenza due to other identified influenza virus with other respiratory manifestations: Principal | ICD-10-CM | POA: Diagnosis present

## 2024-01-26 DIAGNOSIS — I272 Pulmonary hypertension, unspecified: Secondary | ICD-10-CM | POA: Diagnosis present

## 2024-01-26 DIAGNOSIS — D631 Anemia in chronic kidney disease: Secondary | ICD-10-CM | POA: Diagnosis present

## 2024-01-26 DIAGNOSIS — Z79899 Other long term (current) drug therapy: Secondary | ICD-10-CM

## 2024-01-26 DIAGNOSIS — E785 Hyperlipidemia, unspecified: Secondary | ICD-10-CM | POA: Diagnosis present

## 2024-01-26 DIAGNOSIS — E860 Dehydration: Secondary | ICD-10-CM | POA: Diagnosis present

## 2024-01-26 DIAGNOSIS — I5022 Chronic systolic (congestive) heart failure: Secondary | ICD-10-CM | POA: Diagnosis present

## 2024-01-26 DIAGNOSIS — Z8249 Family history of ischemic heart disease and other diseases of the circulatory system: Secondary | ICD-10-CM

## 2024-01-26 DIAGNOSIS — Z885 Allergy status to narcotic agent status: Secondary | ICD-10-CM

## 2024-01-26 DIAGNOSIS — J189 Pneumonia, unspecified organism: Secondary | ICD-10-CM

## 2024-01-26 DIAGNOSIS — I480 Paroxysmal atrial fibrillation: Secondary | ICD-10-CM | POA: Diagnosis present

## 2024-01-26 DIAGNOSIS — I130 Hypertensive heart and chronic kidney disease with heart failure and stage 1 through stage 4 chronic kidney disease, or unspecified chronic kidney disease: Secondary | ICD-10-CM | POA: Diagnosis present

## 2024-01-26 DIAGNOSIS — M1990 Unspecified osteoarthritis, unspecified site: Secondary | ICD-10-CM | POA: Diagnosis present

## 2024-01-26 DIAGNOSIS — N182 Chronic kidney disease, stage 2 (mild): Secondary | ICD-10-CM | POA: Diagnosis present

## 2024-01-26 DIAGNOSIS — Z86711 Personal history of pulmonary embolism: Secondary | ICD-10-CM

## 2024-01-26 DIAGNOSIS — Z87891 Personal history of nicotine dependence: Secondary | ICD-10-CM

## 2024-01-26 DIAGNOSIS — J09X1 Influenza due to identified novel influenza A virus with pneumonia: Secondary | ICD-10-CM | POA: Diagnosis present

## 2024-01-26 DIAGNOSIS — I2510 Atherosclerotic heart disease of native coronary artery without angina pectoris: Secondary | ICD-10-CM | POA: Diagnosis present

## 2024-01-26 DIAGNOSIS — Z66 Do not resuscitate: Secondary | ICD-10-CM | POA: Diagnosis present

## 2024-01-26 DIAGNOSIS — D649 Anemia, unspecified: Secondary | ICD-10-CM | POA: Diagnosis present

## 2024-01-26 LAB — CBC WITH DIFFERENTIAL/PLATELET
Abs Immature Granulocytes: 0.02 10*3/uL (ref 0.00–0.07)
Basophils Absolute: 0 10*3/uL (ref 0.0–0.1)
Basophils Relative: 0 %
Eosinophils Absolute: 0 10*3/uL (ref 0.0–0.5)
Eosinophils Relative: 0 %
HCT: 31.9 % — ABNORMAL LOW (ref 39.0–52.0)
Hemoglobin: 10.7 g/dL — ABNORMAL LOW (ref 13.0–17.0)
Immature Granulocytes: 1 %
Lymphocytes Relative: 23 %
Lymphs Abs: 1 10*3/uL (ref 0.7–4.0)
MCH: 29.2 pg (ref 26.0–34.0)
MCHC: 33.5 g/dL (ref 30.0–36.0)
MCV: 87.2 fL (ref 80.0–100.0)
Monocytes Absolute: 0.8 10*3/uL (ref 0.1–1.0)
Monocytes Relative: 17 %
Neutro Abs: 2.5 10*3/uL (ref 1.7–7.7)
Neutrophils Relative %: 59 %
Platelets: 139 10*3/uL — ABNORMAL LOW (ref 150–400)
RBC: 3.66 MIL/uL — ABNORMAL LOW (ref 4.22–5.81)
RDW: 13.7 % (ref 11.5–15.5)
WBC: 4.3 10*3/uL (ref 4.0–10.5)
nRBC: 0 % (ref 0.0–0.2)

## 2024-01-26 LAB — URINALYSIS, W/ REFLEX TO CULTURE (INFECTION SUSPECTED)
Bacteria, UA: NONE SEEN
Bilirubin Urine: NEGATIVE
Glucose, UA: NEGATIVE mg/dL
Hgb urine dipstick: NEGATIVE
Ketones, ur: NEGATIVE mg/dL
Leukocytes,Ua: NEGATIVE
Nitrite: NEGATIVE
Protein, ur: NEGATIVE mg/dL
Specific Gravity, Urine: <=1.005 (ref 1.005–1.030)
pH: 5 (ref 5.0–8.0)

## 2024-01-26 LAB — COMPREHENSIVE METABOLIC PANEL
ALT: 44 U/L (ref 0–44)
AST: 54 U/L — ABNORMAL HIGH (ref 15–41)
Albumin: 3.3 g/dL — ABNORMAL LOW (ref 3.5–5.0)
Alkaline Phosphatase: 33 U/L — ABNORMAL LOW (ref 38–126)
Anion gap: 9 (ref 5–15)
BUN: 35 mg/dL — ABNORMAL HIGH (ref 8–23)
CO2: 20 mmol/L — ABNORMAL LOW (ref 22–32)
Calcium: 8.4 mg/dL — ABNORMAL LOW (ref 8.9–10.3)
Chloride: 103 mmol/L (ref 98–111)
Creatinine, Ser: 1.45 mg/dL — ABNORMAL HIGH (ref 0.61–1.24)
GFR, Estimated: 48 mL/min — ABNORMAL LOW (ref >60)
Glucose, Bld: 110 mg/dL — ABNORMAL HIGH (ref 70–99)
Potassium: 4.7 mmol/L (ref 3.5–5.1)
Sodium: 132 mmol/L — ABNORMAL LOW (ref 135–145)
Total Bilirubin: 1 mg/dL (ref 0.0–1.2)
Total Protein: 6.2 g/dL — ABNORMAL LOW (ref 6.5–8.1)

## 2024-01-26 LAB — RESP PANEL BY RT-PCR (RSV, FLU A&B, COVID)  RVPGX2
Influenza A by PCR: POSITIVE — AB
Influenza B by PCR: NEGATIVE
Resp Syncytial Virus by PCR: NEGATIVE
SARS Coronavirus 2 by RT PCR: NEGATIVE

## 2024-01-26 LAB — PROTIME-INR
INR: 1.2 (ref 0.8–1.2)
Prothrombin Time: 15.8 s — ABNORMAL HIGH (ref 11.4–15.2)

## 2024-01-26 LAB — LACTIC ACID, PLASMA: Lactic Acid, Venous: 1.1 mmol/L (ref 0.5–1.9)

## 2024-01-26 LAB — BRAIN NATRIURETIC PEPTIDE: B Natriuretic Peptide: 70.6 pg/mL (ref 0.0–100.0)

## 2024-01-26 LAB — APTT: aPTT: 26 s (ref 24–36)

## 2024-01-26 LAB — TROPONIN I (HIGH SENSITIVITY): Troponin I (High Sensitivity): 16 ng/L (ref <18)

## 2024-01-26 MED ORDER — SODIUM CHLORIDE 0.9 % IV SOLN: INTRAVENOUS | Status: DC

## 2024-01-26 MED ORDER — SIMVASTATIN 40 MG PO TABS
40.0000 mg | ORAL_TABLET | Freq: Every evening | ORAL | Status: DC
  Administered 2024-01-26 – 2024-01-28 (×3): 40 mg via ORAL
  Filled 2024-01-26 (×3): qty 1

## 2024-01-26 MED ORDER — OSELTAMIVIR PHOSPHATE 30 MG PO CAPS
30.0000 mg | ORAL_CAPSULE | Freq: Two times a day (BID) | ORAL | Status: DC
  Administered 2024-01-27 – 2024-01-29 (×5): 30 mg via ORAL
  Filled 2024-01-26 (×5): qty 1

## 2024-01-26 MED ORDER — SODIUM CHLORIDE 0.9 % IV SOLN: INTRAVENOUS | Status: AC | PRN

## 2024-01-26 MED ORDER — LEVOTHYROXINE SODIUM 75 MCG PO TABS
75.0000 ug | ORAL_TABLET | Freq: Every day | ORAL | Status: DC
  Administered 2024-01-27 – 2024-01-29 (×3): 75 ug via ORAL
  Filled 2024-01-26 (×2): qty 1

## 2024-01-26 MED ORDER — SPIRONOLACTONE 12.5 MG HALF TABLET
12.5000 mg | ORAL_TABLET | Freq: Every morning | ORAL | Status: DC
Start: 2024-01-27 — End: 2024-01-28
  Administered 2024-01-27: 12.5 mg via ORAL
  Filled 2024-01-26 (×2): qty 1

## 2024-01-26 MED ORDER — OSELTAMIVIR PHOSPHATE 75 MG PO CAPS
75.0000 mg | ORAL_CAPSULE | Freq: Once | ORAL | Status: AC
Start: 2024-01-26 — End: 2024-01-26
  Administered 2024-01-26: 75 mg via ORAL
  Filled 2024-01-26: qty 1

## 2024-01-26 MED ORDER — APIXABAN 5 MG PO TABS
5.0000 mg | ORAL_TABLET | Freq: Two times a day (BID) | ORAL | Status: DC
  Administered 2024-01-26 – 2024-01-29 (×6): 5 mg via ORAL
  Filled 2024-01-26 (×6): qty 1

## 2024-01-26 MED ORDER — SODIUM CHLORIDE 0.9 % IV SOLN
500.0000 mg | Freq: Once | INTRAVENOUS | Status: AC
  Administered 2024-01-26: 500 mg via INTRAVENOUS
  Filled 2024-01-26: qty 5

## 2024-01-26 MED ORDER — SODIUM CHLORIDE 0.9 % IV SOLN
500.0000 mg | INTRAVENOUS | Status: DC
  Administered 2024-01-27 – 2024-01-28 (×2): 500 mg via INTRAVENOUS
  Filled 2024-01-26 (×2): qty 5

## 2024-01-26 MED ORDER — METHYLPREDNISOLONE SODIUM SUCC 125 MG IJ SOLR
125.0000 mg | Freq: Once | INTRAMUSCULAR | Status: AC
  Administered 2024-01-26: 125 mg via INTRAVENOUS
  Filled 2024-01-26: qty 2

## 2024-01-26 MED ORDER — SODIUM CHLORIDE 0.9 % IV SOLN
2.0000 g | INTRAVENOUS | Status: DC
  Administered 2024-01-27 – 2024-01-29 (×3): 2 g via INTRAVENOUS
  Filled 2024-01-26 (×3): qty 20

## 2024-01-26 MED ORDER — IOHEXOL 350 MG/ML SOLN
100.0000 mL | Freq: Once | INTRAVENOUS | Status: AC | PRN
  Administered 2024-01-26: 80 mL via INTRAVENOUS

## 2024-01-26 MED ORDER — OSELTAMIVIR PHOSPHATE 75 MG PO CAPS: 75.0000 mg | ORAL_CAPSULE | Freq: Two times a day (BID) | ORAL | Status: DC

## 2024-01-26 MED ORDER — METOPROLOL SUCCINATE ER 50 MG PO TB24
50.0000 mg | ORAL_TABLET | Freq: Every day | ORAL | Status: DC
  Administered 2024-01-26 – 2024-01-29 (×4): 50 mg via ORAL
  Filled 2024-01-26 (×4): qty 1

## 2024-01-26 MED ORDER — SODIUM CHLORIDE 0.9 % IV SOLN
2.0000 g | Freq: Once | INTRAVENOUS | Status: AC
  Administered 2024-01-26: 2 g via INTRAVENOUS
  Filled 2024-01-26: qty 20

## 2024-01-26 MED ORDER — IPRATROPIUM-ALBUTEROL 0.5-2.5 (3) MG/3ML IN SOLN
3.0000 mL | Freq: Once | RESPIRATORY_TRACT | Status: AC
  Administered 2024-01-26: 3 mL via RESPIRATORY_TRACT
  Filled 2024-01-26: qty 3

## 2024-01-26 MED ORDER — PANTOPRAZOLE SODIUM 40 MG PO TBEC
40.0000 mg | DELAYED_RELEASE_TABLET | Freq: Every day | ORAL | Status: DC
  Administered 2024-01-27 – 2024-01-29 (×3): 40 mg via ORAL
  Filled 2024-01-26 (×4): qty 1

## 2024-01-26 NOTE — ED Provider Notes (Signed)
 [name] Provider Note   CSN: [id number] Arrival date & time: 01/26/24  0909     History  Chief Complaint  Patient presents with   Cough    [name] is a 83 y.o. male.  Patient with ongoing cough generalized weakness shortness of breath.  Diagnosed with flu strep last week has been on Z-Pak breathing treatments without much relief.  Decreased appetite very short of breath when he walks.  Nothing makes it worse or better.  Continues with fever.  Has had some diarrhea.  Denies any nausea or vomiting.  Denies headache or other falls.  History of PE on Eliquis  has heart failure A-fib has noticed some increase swelling in his legs as well.  The history is provided by the patient.       Home Medications Prior to Admission medications   Medication Sig Start Date End Date Taking? Authorizing Provider  apixaban  (ELIQUIS ) 5 MG TABS tablet Take 5 mg by mouth 2 (two) times daily.  04/08/17   [provider]  EUTHYROX  75 MCG tablet TAKE 1 TABLET BY MOUTH ONCE DAILY IN THE MORNING ON AN EMPTY STOMACH. NEEDS TO MAKE AN APPOINTMENT Patient taking differently: Take 75 mcg by mouth daily before breakfast. 08/21/21   Job Lukes, PA  hydrocortisone 2.5 % cream Apply 1 Application topically daily as needed (ezcema). 01/24/21   [provider]  ketoconazole (NIZORAL) 2 % shampoo Apply 1 Application topically 2 (two) times a week. 06/16/23   [provider]  meclizine  (ANTIVERT ) 25 MG tablet TAKE 1 TABLET BY MOUTH AS NEEDED Patient taking differently: Take 25 mg by mouth daily as needed for dizziness. 10/20/21   Job Lukes, PA  metoprolol  succinate (TOPROL  XL) 25 MG 24 hr tablet Take 1 tablet (25 mg total) by mouth daily. 06/30/23 06/29/24  Vann, Jessica U, DO  omeprazole (PRILOSEC) 40 MG capsule Take 40 mg by mouth daily. 01/08/21   [provider]  simvastatin  (ZOCOR ) 40 MG tablet Take 1 tablet by mouth  once daily Patient taking differently: Take 40 mg by mouth every evening. 10/20/22   Job Lukes, PA      Allergies    Tamsulosin  hcl, Flagyl [metronidazole], Lipitor [atorvastatin], and Tramadol    Review of Systems   Review of Systems  Physical Exam Updated Vital Signs BP (!) 108/51   Pulse (!) 51   Temp 99.5 F (37.5 C) (Oral)   Resp 12   Ht 5' 10 (1.778 m)   Wt 84.8 kg   SpO2 100%   BMI 26.82 kg/m  Physical Exam Vitals and nursing note reviewed.  Constitutional:      General: He is not in acute distress.    Appearance: He is well-developed. He is not ill-appearing.  HENT:     Head: Normocephalic and atraumatic.     Nose: Nose normal.     Mouth/Throat:     Mouth: Mucous membranes are moist.  Eyes:     Extraocular Movements: Extraocular movements intact.     Conjunctiva/sclera: Conjunctivae normal.     Pupils: Pupils are equal, round, and reactive to light.  Cardiovascular:     Rate and Rhythm: Normal rate and regular rhythm.     Pulses: Normal pulses.     Heart sounds: Normal heart sounds. No murmur heard. Pulmonary:     Effort: Pulmonary effort is normal. No respiratory distress.     Breath sounds: Normal breath sounds.  Abdominal:  General: Abdomen is flat.     Palpations: Abdomen is soft.     Tenderness: There is no abdominal tenderness.  Musculoskeletal:        General: No swelling.     Cervical back: Normal range of motion and neck supple.     Right lower leg: Edema present.     Left lower leg: Edema present.  Skin:    General: Skin is warm and dry.     Capillary Refill: Capillary refill takes less than 2 seconds.  Neurological:     General: No focal deficit present.     Mental Status: He is alert and oriented to person, place, and time.     Cranial Nerves: No cranial nerve deficit.     Sensory: No sensory deficit.     Motor: No weakness.     Coordination: Coordination normal.  Psychiatric:        Mood and Affect: Mood normal.     ED  Results / Procedures / Treatments   Labs (all labs ordered are listed, but only abnormal results are displayed) Labs Reviewed  RESP PANEL BY RT-PCR (RSV, FLU A&B, COVID)  RVPGX2 - Abnormal; Notable for the following components:      Result Value   Influenza A by PCR POSITIVE (*)    All other components within normal limits  CBC WITH DIFFERENTIAL/PLATELET - Abnormal; Notable for the following components:   RBC 3.66 (*)    Hemoglobin 10.7 (*)    HCT 31.9 (*)    Platelets 139 (*)    All other components within normal limits  COMPREHENSIVE METABOLIC PANEL - Abnormal; Notable for the following components:   Sodium 132 (*)    CO2 20 (*)    Glucose, Bld 110 (*)    BUN 35 (*)    Creatinine, Ser 1.45 (*)    Calcium 8.4 (*)    Total Protein 6.2 (*)    Albumin 3.3 (*)    AST 54 (*)    Alkaline Phosphatase 33 (*)    GFR, Estimated 48 (*)    All other components within normal limits  PROTIME-INR - Abnormal; Notable for the following components:   Prothrombin Time 15.8 (*)    All other components within normal limits  CULTURE, BLOOD (ROUTINE X 2)  CULTURE, BLOOD (ROUTINE X 2)  LACTIC ACID, PLASMA  APTT  LACTIC ACID, PLASMA  URINALYSIS, W/ REFLEX TO CULTURE (INFECTION SUSPECTED)  BRAIN NATRIURETIC PEPTIDE  TROPONIN I (HIGH SENSITIVITY)  TROPONIN I (HIGH SENSITIVITY)    EKG EKG Interpretation Date/Time:  Wednesday January 26 2024 10:20:58 EST Ventricular Rate:  87 PR Interval:  183 QRS Duration:  90 QT Interval:  365 QTC Calculation: 394 R Axis:   58  Text Interpretation: Atrial fibrillation Confirmed by Ruthe Cornet [phone]) on 01/26/2024 10:24:27 AM  Radiology No results found.  Procedures .Critical Care  Performed by: Ruthe Cornet, DO Authorized by: Ruthe Cornet, DO   Critical care provider statement:    Critical care time (minutes):  35   Critical care was necessary to treat or prevent imminent or life-threatening deterioration of the following conditions:   Respiratory failure   Critical care was time spent personally by me on the following activities:  Blood draw for specimens, discussions with primary provider, evaluation of patient's response to treatment, development of treatment plan with patient or surrogate, examination of patient, obtaining history from patient or surrogate, ordering and review of laboratory studies, ordering and performing treatments and interventions, ordering  and review of radiographic studies, pulse oximetry, re-evaluation of patient's condition and review of old charts   Care discussed with: admitting provider       Medications Ordered in ED Medications  azithromycin  (ZITHROMAX ) 500 mg in sodium chloride  0.9 % 250 mL IVPB (500 mg Intravenous New Bag/Given 01/26/24 1047)  0.9 %  sodium chloride  infusion ( Intravenous New Bag/Given 01/26/24 1038)  cefTRIAXone  (ROCEPHIN ) 2 g in sodium chloride  0.9 % 100 mL IVPB (2 g Intravenous New Bag/Given 01/26/24 1039)  methylPREDNISolone  sodium succinate (SOLU-MEDROL ) 125 mg/2 mL injection 125 mg (125 mg Intravenous Given 01/26/24 1042)  ipratropium-albuterol  (DUONEB) 0.5-2.5 (3) MG/3ML nebulizer solution 3 mL (3 mLs Nebulization Given 01/26/24 1042)    ED Course/ Medical Decision Making/ A&P                                 Medical Decision Making Amount and/or Complexity of Data Reviewed Labs: ordered. Radiology: ordered.  Risk Prescription drug management. Decision regarding hospitalization.   [name] is here with ongoing cough fever weakness leg swelling.  History of A-fib PE on Eliquis  heart failure.  Diagnosed with influenza last week.  Also strep throat.  He is finishing course of antibiotics for not getting better.  Increased weakness shortness of breath.  He is intermittently hypoxic when I am talking with him and gets hypoxic when he ambulates.  Overall differential likely secondary pneumonia now secondary to flu.  He sounds like he likely has a COPD history as well  and think that this could be exacerbating his COPD symptoms.  I have less concern for clot ACS, heart failure could be playing a role as well.  EKG showed A-fib but rate controlled.  Will do sepsis workup get chest x-ray Trope BNP repeat COVID and flu test check basic labs start IV antibiotics start breathing treatment and steroids anticipate admission.  Per my review interpretation labs no significant leukocytosis or anemia or electrolyte abnormality.  Lactic acid is normal.  Electrolytes unremarkable.  Chest x-ray consistent with pneumonia per my review and interpretation.  Ultimately given respiratory failure failed outpatient treatment and now I think secondary pneumonia to flu will admit for further care.  I think this is likely in the setting of COPD exacerbation as well.  He has been given steroids breathing treatment antibiotics.  I do not think there is an acute cardiac process at this time.  Will admit for further care.  He does not meet sepsis criteria at this time.  He has no lactic acidosis white count.  This chart was dictated using voice recognition software.  Despite best efforts to proofread,  errors can occur which can change the documentation meaning.         Final Clinical Impression(s) / ED Diagnoses Final diagnoses:  Influenza A  Acute respiratory failure with hypoxia (HCC)  Community acquired pneumonia of left lung, unspecified part of lung    Rx / DC Orders ED Discharge Orders     None         Ruthe Cornet, DO 01/26/24 1145

## 2024-01-26 NOTE — Sepsis Progress Note (Signed)
 Code Sepsis protocol being monitored by eLink.

## 2024-01-26 NOTE — ED Notes (Signed)
 RT Note: Patient had to be placed on 2lpm [place] to maintain oxygen saturation 94%. Patient tolerating well at this time

## 2024-01-26 NOTE — ED Triage Notes (Signed)
 Excessive coughing, runny nose, weakness, decreased appetite.  Seen on Friday at PCP was diagnosed with strep and flu.  Pt is not getting better and needs eval

## 2024-01-26 NOTE — H&P (Signed)
 History and Physical    Patient: [name] FMW:[id number] DOB: 1941-12-12 DOA: 01/26/2024 DOS: the patient was seen and examined on 01/26/2024 PCP: Wilfrid Dedra Maus, PA-C  Patient coming from: Home  Chief Complaint:  Chief Complaint  Patient presents with   Cough   HPI: [name] is a 83 y.o. male with medical history significant of coronary artery disease, pulmonary hypertension, atrial fibrillation, history of pulmonary embolism on Eliquis , essential hypertension, hyperlipidemia, decreased hearing,Hypothyroidism among other things who was admitted to [facility] after complaining of shortness of breath and cough.  Patient was apparently diagnosed with combination of streptococcal infection and influenza A last week.  He was placed on Z-Pak and breathing treatments.  Continues to have significant shortness of breath and cough.  Also decreased oxygen saturation into the 80s.  Not on home oxygen.  Was seen in the ER where there was reported oxygen saturation of 87% at rest.  Also exertional hypoxia.  Patient was imaged studies shows right upper lobe infiltrate consistent with pneumonia.  This is worsened since last week.  He denied fever or chills.  Continue to have some cough.  At this point patient is being admitted with acute hypoxic respiratory failure due to pneumonia probably as a result of influenza A infection.  Repeat testing today is positive for influenza A.  Review of Systems: As mentioned in the history of present illness. All other systems reviewed and are negative. Past Medical History:  Diagnosis Date   Abdominal wall defect, acquired    Blockage of coronary artery of heart (HCC)    30%   Hearing aid worn    Insulin  resistance    LV dysfunction    reduction   Osteoarthritis    Pernicious anemia 03/03/2021   Pulmonary embolism [facility])    Past Surgical History:  Procedure Laterality Date   ACNE CYST REMOVAL  2012   Back    APPENDECTOMY     CARDIAC  CATHETERIZATION     COLECTOMY  11/2013   [facility], 2nd to diverticulitis   RT hip replacement     TONSILLECTOMY     Social History:  reports that he quit smoking about 38 years ago. His smoking use included cigarettes. He started smoking about 53 years ago. He has a 15 pack-year smoking history. He has never used smokeless tobacco. He reports that he does not drink alcohol and does not use drugs.  Allergies  Allergen Reactions   Tamsulosin  Hcl Other (See Comments)    Pre-syncope    Flagyl [Metronidazole] Nausea And Vomiting    States IV form causes to allergy   Lipitor [Atorvastatin] Other (See Comments)    Myalgia   Tramadol Other (See Comments)    Dizziness    Family History  Problem Relation Age of Onset   Heart disease Mother        pacemaker    Prior to Admission medications   Medication Sig Start Date End Date Taking? Authorizing Provider  apixaban  (ELIQUIS ) 5 MG TABS tablet Take 5 mg by mouth 2 (two) times daily.  04/08/17  Yes [provider]  azithromycin  (ZITHROMAX ) 250 MG tablet Take 250-500 mg by mouth as directed. Take for 5 days 01/21/24  Yes [provider]  cyanocobalamin  (VITAMIN B12) 1000 MCG/ML injection Inject 1,000 mcg into the muscle every 30 (thirty) days. 04/22/23 07/31/24 Yes [provider]  EUTHYROX  75 MCG tablet TAKE 1 TABLET BY MOUTH ONCE DAILY IN THE MORNING ON AN EMPTY STOMACH.  NEEDS TO MAKE AN APPOINTMENT Patient taking differently: Take 75 mcg by mouth daily before breakfast. 08/21/21  Yes Worley, Samantha, PA  hydrocortisone 2.5 % cream Apply 1 Application topically daily as needed (ezcema). 01/24/21  Yes [provider]  ketoconazole (NIZORAL) 2 % shampoo Apply 1 Application topically 2 (two) times a week. 06/16/23  Yes [provider]  lisinopril (ZESTRIL) 20 MG tablet Take 20 mg by mouth daily. 12/03/23  Yes [provider]  meclizine  (ANTIVERT ) 25 MG tablet TAKE 1 TABLET BY MOUTH AS NEEDED Patient  taking differently: Take 25 mg by mouth daily as needed for dizziness. 10/20/21  Yes Job Lukes, PA  metoprolol  succinate (TOPROL -XL) 50 MG 24 hr tablet Take 50 mg by mouth daily. 01/14/24  Yes [provider]  omeprazole (PRILOSEC) 40 MG capsule Take 40 mg by mouth daily. 01/08/21  Yes [provider]  simvastatin  (ZOCOR ) 40 MG tablet Take 1 tablet by mouth once daily Patient taking differently: Take 40 mg by mouth every evening. 10/20/22  Yes Job Lukes, PA  spironolactone  (ALDACTONE ) 25 MG tablet Take 12.5 mg by mouth every morning. 01/19/24  Yes [provider]    Physical Exam: Vitals:   01/26/24 1515 01/26/24 1600 01/26/24 1700 01/26/24 1836  BP: 113/67 (!) 112/48 114/61 119/76  Pulse: 84 (!) 42 76 88  Resp: 15 17 19 18   Temp: 98.2 F (36.8 C)   98 F (36.7 C)  TempSrc: Oral   Oral  SpO2: 95% 96% 95% 93%  Weight:      Height:       Constitutional: Acutely ill looking, NAD, calm, comfortable Eyes: PERRL, lids and conjunctivae normal ENMT: Mucous membranes are moist. Posterior pharynx clear of any exudate or lesions.Normal dentition.  Neck: normal, supple, no masses, no thyromegaly Respiratory: Decreased air entry bilaterally, no wheezing, no crackles. Normal respiratory effort. No accessory muscle use.  Cardiovascular: Regular rate and rhythm, no murmurs / rubs / gallops.  Trace extremity edema. 2+ pedal pulses. No carotid bruits.  Abdomen: no tenderness, no masses palpated. No hepatosplenomegaly. Bowel sounds positive.  Musculoskeletal: Good range of motion, no joint swelling or tenderness, Skin: no rashes, lesions, ulcers. No induration Neurologic: CN 2-12 grossly intact. Sensation intact, DTR normal. Strength 5/5 in all 4.  Psychiatric: Normal judgment and insight. Alert and oriented x 3. Normal mood  Data Reviewed:  Temperature 99.5, blood pressure 108/51, Sodium 132, CO2 of 20, oxygen sats 93% on 2 L, creatinine is 1.45 with a BUN of  35.Hemoglobin 10.7 and platelets of 139.  Acute viral screen is positive for influenza A, urinalysis is negative.  Checks x-ray showed no active disease.  CT angiogram of the chest showed no PE however indicated increasing right upper lobe mass versus infiltrate.  Assessment and Plan:  #1 acute hypoxic respiratory failure: Presumed due to community-acquired pneumonia secondary to influenza A infection and streptococcal infection diagnosed last week.  This could be secondary bacterial pneumonia.  Patient could also be having exacerbation of his systolic CHF.  Previous EF in 2021 was only 40%.  Patient will be initiated on IV Rocephin  and Zithromax  to cover for possible strep infection as well as community-acquired pneumonia.  Also Tamiflu .  Not sure if patient was given Tamiflu  last week.  He is still positive.  May not benefit by the symptom could be acute infection.  Will endeavor to get echocardiogram to evaluate for possible worsening EF.  #2 influenza A infection: I will empirically treat with Tamiflu .  #  3 streptococcal infection: Reported that patient had these last week.  Patient will be on Rocephin .  #4 systolic dysfunction CHF: Get another echocardiogram.  Continue chronic home medications  #5 essential hypertension: Continue with metoprolol .  Hold lisinopril due to AKI  #6 AKI: Probably prerenal.  Mild hydration.  Continue to monitor renal function  #7 hypothyroidism: Continue with levothyroxine   #8 pulmonary hypertension: Mild.  Continue with oxygenation  #9 history of pulmonary embolism: Repeat CTA today is negative for PE.  Continue anticoagulation  #10 normocytic anemia: Probably of chronic disease.  Monitor H&H  #11 coronary artery disease: Stable.  Continue to monitor  #12 hyponatremia: Probably dehydration.  Continue to monitor sodium level    Advance Care Planning:   Code Status: Prior DNR  Consults: None  Family Communication: No family at bedside  Severity of  Illness: The appropriate patient status for this patient is INPATIENT. Inpatient status is judged to be reasonable and necessary in order to provide the required intensity of service to ensure the patient's safety. The patient's presenting symptoms, physical exam findings, and initial radiographic and laboratory data in the context of their chronic comorbidities is felt to place them at high risk for further clinical deterioration. Furthermore, it is not anticipated that the patient will be medically stable for discharge from the hospital within 2 midnights of admission.   * I certify that at the point of admission it is my clinical judgment that the patient will require inpatient hospital care spanning beyond 2 midnights from the point of admission due to high intensity of service, high risk for further deterioration and high frequency of surveillance required.*  AuthorBETHA SIM KNOLL, MD 01/26/2024 6:47 PM  For on call review [email or web address].

## 2024-01-27 ENCOUNTER — Observation Stay (HOSPITAL_COMMUNITY): Payer: HMO

## 2024-01-27 ENCOUNTER — Encounter: Payer: Self-pay | Admitting: Family

## 2024-01-27 DIAGNOSIS — Z8249 Family history of ischemic heart disease and other diseases of the circulatory system: Secondary | ICD-10-CM | POA: Diagnosis not present

## 2024-01-27 DIAGNOSIS — J1008 Influenza due to other identified influenza virus with other specified pneumonia: Secondary | ICD-10-CM | POA: Diagnosis present

## 2024-01-27 DIAGNOSIS — Z66 Do not resuscitate: Secondary | ICD-10-CM | POA: Diagnosis present

## 2024-01-27 DIAGNOSIS — Z86711 Personal history of pulmonary embolism: Secondary | ICD-10-CM | POA: Diagnosis not present

## 2024-01-27 DIAGNOSIS — Z96641 Presence of right artificial hip joint: Secondary | ICD-10-CM | POA: Diagnosis present

## 2024-01-27 DIAGNOSIS — I130 Hypertensive heart and chronic kidney disease with heart failure and stage 1 through stage 4 chronic kidney disease, or unspecified chronic kidney disease: Secondary | ICD-10-CM | POA: Diagnosis present

## 2024-01-27 DIAGNOSIS — I480 Paroxysmal atrial fibrillation: Secondary | ICD-10-CM | POA: Diagnosis present

## 2024-01-27 DIAGNOSIS — Z79899 Other long term (current) drug therapy: Secondary | ICD-10-CM | POA: Diagnosis not present

## 2024-01-27 DIAGNOSIS — I5022 Chronic systolic (congestive) heart failure: Secondary | ICD-10-CM | POA: Diagnosis present

## 2024-01-27 DIAGNOSIS — E860 Dehydration: Secondary | ICD-10-CM | POA: Diagnosis present

## 2024-01-27 DIAGNOSIS — M1990 Unspecified osteoarthritis, unspecified site: Secondary | ICD-10-CM | POA: Diagnosis present

## 2024-01-27 DIAGNOSIS — R059 Cough, unspecified: Secondary | ICD-10-CM | POA: Diagnosis present

## 2024-01-27 DIAGNOSIS — I272 Pulmonary hypertension, unspecified: Secondary | ICD-10-CM | POA: Diagnosis present

## 2024-01-27 DIAGNOSIS — N179 Acute kidney failure, unspecified: Secondary | ICD-10-CM | POA: Diagnosis present

## 2024-01-27 DIAGNOSIS — E785 Hyperlipidemia, unspecified: Secondary | ICD-10-CM | POA: Diagnosis present

## 2024-01-27 DIAGNOSIS — I5020 Unspecified systolic (congestive) heart failure: Secondary | ICD-10-CM

## 2024-01-27 DIAGNOSIS — J101 Influenza due to other identified influenza virus with other respiratory manifestations: Secondary | ICD-10-CM | POA: Diagnosis present

## 2024-01-27 DIAGNOSIS — J9601 Acute respiratory failure with hypoxia: Secondary | ICD-10-CM | POA: Diagnosis present

## 2024-01-27 DIAGNOSIS — J159 Unspecified bacterial pneumonia: Secondary | ICD-10-CM | POA: Diagnosis present

## 2024-01-27 DIAGNOSIS — Z7901 Long term (current) use of anticoagulants: Secondary | ICD-10-CM | POA: Diagnosis not present

## 2024-01-27 DIAGNOSIS — Z87891 Personal history of nicotine dependence: Secondary | ICD-10-CM | POA: Diagnosis not present

## 2024-01-27 DIAGNOSIS — N182 Chronic kidney disease, stage 2 (mild): Secondary | ICD-10-CM | POA: Diagnosis present

## 2024-01-27 DIAGNOSIS — E871 Hypo-osmolality and hyponatremia: Secondary | ICD-10-CM | POA: Diagnosis present

## 2024-01-27 DIAGNOSIS — E039 Hypothyroidism, unspecified: Secondary | ICD-10-CM | POA: Diagnosis present

## 2024-01-27 DIAGNOSIS — I2510 Atherosclerotic heart disease of native coronary artery without angina pectoris: Secondary | ICD-10-CM | POA: Diagnosis present

## 2024-01-27 DIAGNOSIS — D631 Anemia in chronic kidney disease: Secondary | ICD-10-CM | POA: Diagnosis present

## 2024-01-27 LAB — CBC
HCT: 30.4 % — ABNORMAL LOW (ref 39.0–52.0)
Hemoglobin: 9.8 g/dL — ABNORMAL LOW (ref 13.0–17.0)
MCH: 29 pg (ref 26.0–34.0)
MCHC: 32.2 g/dL (ref 30.0–36.0)
MCV: 89.9 fL (ref 80.0–100.0)
Platelets: 153 10*3/uL (ref 150–400)
RBC: 3.38 MIL/uL — ABNORMAL LOW (ref 4.22–5.81)
RDW: 13.7 % (ref 11.5–15.5)
WBC: 7.8 10*3/uL (ref 4.0–10.5)
nRBC: 0 % (ref 0.0–0.2)

## 2024-01-27 LAB — ECHOCARDIOGRAM COMPLETE
AR max vel: 2.86 cm2
AV Area VTI: 2.18 cm2
AV Area mean vel: 2.7 cm2
AV Mean grad: 3.5 mm[Hg]
AV Peak grad: 5.6 mm[Hg]
AV Vena cont: 1.1 cm
Ao pk vel: 1.19 m/s
Area-P 1/2: 3.54 cm2
Height: 70 in
P 1/2 time: 480 ms
S' Lateral: 4.6 cm
Weight: 2991.2 [oz_av]

## 2024-01-27 LAB — COMPREHENSIVE METABOLIC PANEL
ALT: 41 U/L (ref 0–44)
AST: 43 U/L — ABNORMAL HIGH (ref 15–41)
Albumin: 3 g/dL — ABNORMAL LOW (ref 3.5–5.0)
Alkaline Phosphatase: 31 U/L — ABNORMAL LOW (ref 38–126)
Anion gap: 11 (ref 5–15)
BUN: 38 mg/dL — ABNORMAL HIGH (ref 8–23)
CO2: 19 mmol/L — ABNORMAL LOW (ref 22–32)
Calcium: 8.6 mg/dL — ABNORMAL LOW (ref 8.9–10.3)
Chloride: 106 mmol/L (ref 98–111)
Creatinine, Ser: 1.34 mg/dL — ABNORMAL HIGH (ref 0.61–1.24)
GFR, Estimated: 53 mL/min — ABNORMAL LOW (ref >60)
Glucose, Bld: 141 mg/dL — ABNORMAL HIGH (ref 70–99)
Potassium: 5 mmol/L (ref 3.5–5.1)
Sodium: 136 mmol/L (ref 135–145)
Total Bilirubin: 0.7 mg/dL (ref 0.0–1.2)
Total Protein: 5.7 g/dL — ABNORMAL LOW (ref 6.5–8.1)

## 2024-01-27 LAB — HIV ANTIBODY (ROUTINE TESTING W REFLEX): HIV Screen 4th Generation wRfx: NONREACTIVE

## 2024-01-27 MED ORDER — IPRATROPIUM-ALBUTEROL 0.5-2.5 (3) MG/3ML IN SOLN
3.0000 mL | Freq: Four times a day (QID) | RESPIRATORY_TRACT | Status: DC
  Administered 2024-01-27 (×2): 3 mL via RESPIRATORY_TRACT
  Filled 2024-01-27 (×2): qty 3

## 2024-01-27 MED ORDER — IPRATROPIUM-ALBUTEROL 0.5-2.5 (3) MG/3ML IN SOLN: 3.0000 mL | RESPIRATORY_TRACT | Status: DC | PRN

## 2024-01-27 MED ORDER — ENSURE ENLIVE PO LIQD
237.0000 mL | Freq: Two times a day (BID) | ORAL | Status: DC
  Administered 2024-01-27: 237 mL via ORAL

## 2024-01-27 MED ORDER — GUAIFENESIN ER 600 MG PO TB12
600.0000 mg | ORAL_TABLET | Freq: Two times a day (BID) | ORAL | Status: DC
  Administered 2024-01-27 – 2024-01-29 (×5): 600 mg via ORAL
  Filled 2024-01-27 (×5): qty 1

## 2024-01-27 NOTE — Progress Notes (Signed)
*  PRELIMINARY RESULTS* Echocardiogram 2D Echocardiogram has been performed.  [name] 01/27/2024, 10:59 AM

## 2024-01-27 NOTE — Plan of Care (Signed)
  Problem: Education: Goal: Knowledge of General Education information will improve Description: Including pain rating scale, medication(s)/side effects and non-pharmacologic comfort measures Outcome: Progressing   Problem: Health Behavior/Discharge Planning: Goal: Ability to manage health-related needs will improve Outcome: Progressing   Problem: Clinical Measurements: Goal: Ability to maintain clinical measurements within normal limits will improve Outcome: Progressing Goal: Will remain free from infection Outcome: Progressing Goal: Diagnostic test results will improve Outcome: Progressing Goal: Respiratory complications will improve Outcome: Progressing Goal: Cardiovascular complication will be avoided Outcome: Progressing   Problem: Activity: Goal: Risk for activity intolerance will decrease Outcome: Progressing   Problem: Nutrition: Goal: Adequate nutrition will be maintained Outcome: Progressing   Problem: Coping: Goal: Level of anxiety will decrease Outcome: Progressing   Problem: Elimination: Goal: Will not experience complications related to bowel motility Outcome: Progressing Goal: Will not experience complications related to urinary retention Outcome: Progressing   Problem: Pain Managment: Goal: General experience of comfort will improve and/or be controlled Outcome: Progressing   Problem: Safety: Goal: Ability to remain free from injury will improve Outcome: Progressing   Problem: Skin Integrity: Goal: Risk for impaired skin integrity will decrease Outcome: Progressing   Problem: Activity: Goal: Ability to tolerate increased activity will improve Outcome: Progressing   Problem: Clinical Measurements: Goal: Ability to maintain a body temperature in the normal range will improve Outcome: Progressing   Problem: Respiratory: Goal: Ability to maintain adequate ventilation will improve Outcome: Progressing Goal: Ability to maintain a clear airway  will improve Outcome: Progressing   Problem: Education: Goal: Ability to demonstrate management of disease process will improve Outcome: Progressing Goal: Ability to verbalize understanding of medication therapies will improve Outcome: Progressing Goal: Individualized Educational Video(s) Outcome: Progressing   Problem: Activity: Goal: Capacity to carry out activities will improve Outcome: Progressing   Problem: Cardiac: Goal: Ability to achieve and maintain adequate cardiopulmonary perfusion will improve Outcome: Progressing

## 2024-01-27 NOTE — Progress Notes (Signed)
 Mobility Specialist - Progress Note   01/27/24 1011  Oxygen Therapy  SpO2 90 %  O2 Device Room Air  Patient Activity (if Appropriate) Ambulating  Mobility  Activity Ambulated with assistance in hallway  Level of Assistance Modified independent, requires aide device or extra time  Assistive Device Front wheel walker  Distance Ambulated (ft) 250 ft  Activity Response Tolerated well  Mobility Referral Yes  Mobility visit 1 Mobility  Mobility Specialist Start Time (ACUTE ONLY) 0955  Mobility Specialist Stop Time (ACUTE ONLY) 1010  Mobility Specialist Time Calculation (min) (ACUTE ONLY) 15 min   Nurse requested Mobility Specialist to perform oxygen saturation test with pt which includes removing pt from oxygen both at rest and while ambulating.  Below are the results from that testing.     Patient Saturations on Room Air at Rest = spO2 91%  Patient Saturations on Room Air while Ambulating = sp02 90% .  At end of testing pt left in room on 0  Liters of oxygen.  Reported results to nurse. Pt received in bed and agreeable to mobility. Pt took x1 standing rest break d/t SOB. No other complaints during session. Upon returning to room, pt requested assistance to the bathroom. SpO2 checked upon return and found to be 89%. Encouraged pursed lip breaths bringing SPO2 to 90%. Pt to bed after session with all needs met.     Pre-mobility: 91% SpO2 (RA) During mobility: 90% SpO2 (RA) Post-mobility: 89-90% SPO2 (RA)  Chief Technology Officer

## 2024-01-27 NOTE — Progress Notes (Signed)
 PROGRESS NOTE  [name]  FMW:[id number] DOB: 10/25/1941 DOA: 01/26/2024 PCP: Wilfrid Dedra Maus, PA-C   Brief Narrative: Patient is 83 year old male with history of coronary artery disease, pulmonary hypertension, paroxysmal A-fib, PE on Eliquis , hypertension, hyperlipidemia, hypothyroidism who initially presented with med [facility] with complaint of shortness of breath, cough.  He was recently diagnosed with streptococcal infection, influenza last week.  Was placed on Z-Pak.  Continued to have shortness of breath at home.  On presentation to the ED, he was saturating 87% on room air.  Chest x-ray showed right upper lobe infiltrate consistent with pneumonia.  Patient admitted for the management of acute hypoxic respiratory secondary pneumonia/flu  Assessment & Plan:  Principal Problem:   Acute hypoxic respiratory failure (HCC) Active Problems:   Hypothyroidism   Paroxysmal atrial fibrillation (HCC)   Hyponatremia   Pulmonary hypertension (HCC)   CAD (coronary artery disease)   AKI (acute kidney injury) (HCC)   Normocytic anemia   Influenza A with pneumonia   Acute hypoxic respiratory failure secondary to community-acquired pneumonia: Presented with dyspnea, hypoxia.  Recently diagnosed with strep/flu infection.  Chest imaging showed right upper lobe pneumonia.  Likely postviral bacterial pneumonia.  Started on ceftriaxone , azithromycin .  Also on Tamiflu .  Continue bronchodilators, mucolytics  Flu/strep infection: Diagnosed last week.  Flu still positive.  Not sure about the benefit of continuing Tamiflu  now.  Has been started though  HFrEF: Appears euvolemic.  Last echo 11/20/2020 showed EF of 40%.  Echo done here during this admission showed EF of 48%, global hypokinesis.  He needs to follow-up with his cardiologist as an outpatient.He follows at [facility]   Hypertension: Continue metoprolol .  Lisinopril on hold due to AKI  AKI on CKD stage II: Looks like his baseline  creatinine is around  1.3.  Currently kidney function at baseline.  Hypothyroidism: Continue Synthyroid  History of PE: CT negative for PE.  Continue Eliquis  for anticoagulation  Normocytic anemia: Current hemoglobin stable  Coronary artery disease: No anginal symptoms.  Continue to monitor  Hyponatremia: Mild.  Continue to monitor  Weakness: PT consulted         DVT prophylaxis: apixaban  (ELIQUIS ) tablet 5 mg     Code Status: Do not attempt resuscitation (DNR) PRE-ARREST INTERVENTIONS DESIRED  Family Communication: called and discussed with wife on phone today  Patient status:Inpatient  Patient is from :home  Anticipated discharge un:ynfz  Estimated DC date:1-2 days   Consultants: None  Procedures:None  Antimicrobials:  Anti-infectives (From admission, onward)    Start     Dose/Rate Route Frequency Ordered Stop   01/27/24 1100  azithromycin  (ZITHROMAX ) 500 mg in sodium chloride  0.9 % 250 mL IVPB        500 mg 250 mL/hr over 60 Minutes Intravenous Every 24 hours 01/26/24 1847 02/01/24 1059   01/27/24 1000  cefTRIAXone  (ROCEPHIN ) 2 g in sodium chloride  0.9 % 100 mL IVPB        2 g 200 mL/hr over 30 Minutes Intravenous Every 24 hours 01/26/24 1847 02/01/24 0959   01/27/24 1000  oseltamivir  (TAMIFLU ) capsule 30 mg       Placed in Followed by Linked Group   30 mg Oral 2 times daily 01/26/24 1914 01/31/24 2159   01/26/24 2200  oseltamivir  (TAMIFLU ) capsule 75 mg  Status:  Discontinued        75 mg Oral 2 times daily 01/26/24 1905 01/26/24 1914   01/26/24 2000  oseltamivir  (TAMIFLU ) capsule 75 mg  Placed in Followed by Linked Group   75 mg Oral  Once 01/26/24 1914 01/26/24 2113   01/26/24 1015  cefTRIAXone  (ROCEPHIN ) 2 g in sodium chloride  0.9 % 100 mL IVPB        2 g 200 mL/hr over 30 Minutes Intravenous Once 01/26/24 1005 01/26/24 1156   01/26/24 1015  azithromycin  (ZITHROMAX ) 500 mg in sodium chloride  0.9 % 250 mL IVPB        500 mg 250 mL/hr over  60 Minutes Intravenous  Once 01/26/24 1005 01/26/24 1156       Subjective: Patient seen and examined at bedside today.  Hemodynamically stable.  Overall comfortable.  On 2 L of permit.  He feels better since admission.  Still has some cough.  Does not look in respiratory distress.  Objective: Vitals:   01/26/24 1700 01/26/24 1836 01/27/24 0300 01/27/24 0353  BP: 114/61 119/76  (!) 126/52  Pulse: 76 88  70  Resp: 19 18 20    Temp:  98 F (36.7 C)  98 F (36.7 C)  TempSrc:  Oral  Axillary  SpO2: 95% 93%  91%  Weight:      Height:        Intake/Output Summary (Last 24 hours) at 01/27/2024 0804 Last data filed at 01/27/2024 0543 Gross per 24 hour  Intake 854.75 ml  Output --  Net 854.75 ml   Filed Weights   01/26/24 0940 01/26/24 1018  Weight: 84.4 kg 84.8 kg    Examination:  General exam: Overall comfortable, not in distress, lying in bed, weak HEENT: PERRL Respiratory system: Bilateral rhonchi, diminished sounds Cardiovascular system: S1 & S2 heard, RRR.  Gastrointestinal system: Abdomen is nondistended, soft and nontender. Central nervous system: Alert and oriented Extremities: No edema, no clubbing ,no cyanosis Skin: No rashes, no ulcers,no icterus     Data Reviewed: I have personally reviewed following labs and imaging studies  CBC: Recent Labs  Lab 01/26/24 1016  WBC 4.3  NEUTROABS 2.5  HGB 10.7*  HCT 31.9*  MCV 87.2  PLT 139*   Basic Metabolic Panel: Recent Labs  Lab 01/26/24 1016  NA 132*  K 4.7  CL 103  CO2 20*  GLUCOSE 110*  BUN 35*  CREATININE 1.45*  CALCIUM 8.4*     Recent Results (from the past 240 hours)  Resp panel by RT-PCR (RSV, Flu A&B, Covid) Anterior Nasal Swab     Status: Abnormal   Collection Time: 01/26/24  9:12 AM   Specimen: Anterior Nasal Swab  Result Value Ref Range Status   SARS Coronavirus 2 by RT PCR NEGATIVE NEGATIVE Final    Comment: (NOTE) SARS-CoV-2 target nucleic acids are NOT DETECTED.  The SARS-CoV-2 RNA  is generally detectable in upper respiratory specimens during the acute phase of infection. The lowest concentration of SARS-CoV-2 viral copies this assay can detect is 138 copies/mL. A negative result does not preclude SARS-Cov-2 infection and should not be used as the sole basis for treatment or other patient management decisions. A negative result may occur with  improper specimen collection/handling, submission of specimen other than nasopharyngeal swab, presence of viral mutation(s) within the areas targeted by this assay, and inadequate number of viral copies(<138 copies/mL). A negative result must be combined with clinical observations, patient history, and epidemiological information. The expected result is Negative.  Fact Sheet for Patients:  bloggercourse.com  Fact Sheet for Healthcare Providers:  seriousbroker.it  This test is no t yet approved or cleared by the [place]  FDA  and  has been authorized for detection and/or diagnosis of SARS-CoV-2 by FDA under an Emergency Use Authorization (EUA). This EUA will remain  in effect (meaning this test can be used) for the duration of the COVID-19 declaration under Section 564(b)(1) of the Act, 21 U.S.C.section 360bbb-3(b)(1), unless the authorization is terminated  or revoked sooner.       Influenza A by PCR POSITIVE (A) NEGATIVE Final   Influenza B by PCR NEGATIVE NEGATIVE Final    Comment: (NOTE) The Xpert Xpress SARS-CoV-2/FLU/RSV plus assay is intended as an aid in the diagnosis of influenza from Nasopharyngeal swab specimens and should not be used as a sole basis for treatment. Nasal washings and aspirates are unacceptable for Xpert Xpress SARS-CoV-2/FLU/RSV testing.  Fact Sheet for Patients: bloggercourse.com  Fact Sheet for Healthcare Providers: seriousbroker.it  This test is not yet approved or cleared by the  [place]  FDA and has been authorized for detection and/or diagnosis of SARS-CoV-2 by FDA under an Emergency Use Authorization (EUA). This EUA will remain in effect (meaning this test can be used) for the duration of the COVID-19 declaration under Section 564(b)(1) of the Act, 21 U.S.C. section 360bbb-3(b)(1), unless the authorization is terminated or revoked.     Resp Syncytial Virus by PCR NEGATIVE NEGATIVE Final    Comment: (NOTE) Fact Sheet for Patients: bloggercourse.com  Fact Sheet for Healthcare Providers: seriousbroker.it  This test is not yet approved or cleared by the [place]  FDA and has been authorized for detection and/or diagnosis of SARS-CoV-2 by FDA under an Emergency Use Authorization (EUA). This EUA will remain in effect (meaning this test can be used) for the duration of the COVID-19 declaration under Section 564(b)(1) of the Act, 21 U.S.C. section 360bbb-3(b)(1), unless the authorization is terminated or revoked.  Performed at [facility], [address] [place], [place] [address]   Blood Culture (routine x 2)     Status: None (Preliminary result)   Collection Time: 01/26/24 10:05 AM   Specimen: BLOOD  Result Value Ref Range Status   Specimen Description   Final    BLOOD RIGHT ANTECUBITAL Performed at [facility], [address] [place], [place] [address]    Special Requests   Final    BOTTLES DRAWN AEROBIC AND ANAEROBIC Blood Culture results may not be optimal due to an inadequate volume of blood received in culture bottles Performed at [facility], [address] [place], [place] [address]    Culture   Final    NO GROWTH < 24 HOURS Performed at [facility], [address], [place], [place] [address]    Report Status PENDING  Incomplete  Blood Culture (routine x 2)     Status: None (Preliminary result)   Collection Time: 01/26/24 10:10 AM    Specimen: BLOOD RIGHT FOREARM  Result Value Ref Range Status   Specimen Description   Final    BLOOD RIGHT FOREARM Performed at [facility], [address] [place], [place] [address]    Special Requests   Final    BOTTLES DRAWN AEROBIC ONLY Blood Culture results may not be optimal due to an inadequate volume of blood received in culture bottles Performed at [facility], [address] [place], [place] [address]    Culture   Final    NO GROWTH < 24 HOURS Performed at [facility], [address], [place], [place] [address]  Report Status PENDING  Incomplete     Radiology Studies: CT Angio Chest PE W and/or Wo Contrast Result Date: 01/26/2024 CLINICAL DATA:  Pulmonary embolism suspected, excessive coughing, runny nose, weakness, decreased appetite. Diagnosed with strep and flu. Not getting better. EXAM: CT ANGIOGRAPHY CHEST WITH CONTRAST TECHNIQUE: Multidetector CT imaging of the chest was performed using the standard protocol during bolus administration of intravenous contrast. Multiplanar CT image reconstructions and MIPs were obtained to evaluate the vascular anatomy. RADIATION DOSE REDUCTION: This exam was performed according to the departmental dose-optimization program which includes automated exposure control, adjustment of the mA and/or kV according to patient size and/or use of iterative reconstruction technique. CONTRAST:  80mL OMNIPAQUE  IOHEXOL  350 MG/ML SOLN COMPARISON:  Same day chest radiograph and CT 03/03/2017 FINDINGS: Cardiovascular: Negative for acute pulmonary embolism. No pericardial effusion. Coronary artery and aortic atherosclerotic calcification. Ascending aorta at the upper limits of normal caliber measuring 39 mm. Mediastinum/Nodes: Trachea and esophagus are unremarkable. No thoracic adenopathy. Lungs/Pleura: Mild bronchial wall thickening. Scarring/atelectasis in the left lower lobe. Increased focus of consolidation in the anteromedial right  upper lobe (series 303/image 83) compared with 03/03/2017. This measures 1.6 x 0.8 cm and is slightly increased in size compared to 2018. This is favored to represent progressive atelectasis or scarring however underlying mass or pneumonia are not excluded. No pleural effusion or pneumothorax. Upper Abdomen: No acute abnormality. Musculoskeletal: No acute fracture. Review of the MIP images confirms the above findings. IMPRESSION: 1. Negative for acute pulmonary embolism. 2. Increased focus of consolidation in the anteromedial right upper lobe compared with 03/03/2017. This is favored to represent progressive atelectasis or scarring however underlying mass or pneumonia are not excluded. Recommend follow-up in CT in 3-6 months. 3.  Aortic Atherosclerosis (ICD10-I70.0). Electronically Signed   By: Norman Gatlin M.D.   On: 01/26/2024 14:25   DG Chest Portable 1 View Result Date: 01/26/2024 CLINICAL DATA:  cough.  Weakness. EXAM: PORTABLE CHEST 1 VIEW COMPARISON:  06/29/2023. FINDINGS: Bilateral lung fields are clear. Bilateral costophrenic angles are clear. Normal cardio-mediastinal silhouette. No acute osseous abnormalities. The soft tissues are within normal limits. IMPRESSION: No active disease. Electronically Signed   By: Ree Molt M.D.   On: 01/26/2024 12:08    Scheduled Meds:  apixaban   5 mg Oral BID   feeding supplement  237 mL Oral BID BM   levothyroxine   75 mcg Oral QAC breakfast   metoprolol  succinate  50 mg Oral Daily   oseltamivir   30 mg Oral BID   pantoprazole   40 mg Oral Daily   simvastatin   40 mg Oral QPM   spironolactone   12.5 mg Oral q morning   Continuous Infusions:  sodium chloride  Stopped (01/26/24 1157)   sodium chloride  50 mL/hr at 01/27/24 0543   azithromycin      cefTRIAXone  (ROCEPHIN )  IV       LOS: 0 days   Ivonne Mustache, MD [facility] P2/05/2024, 8:04 AM  PROGRESS NOTE  [name]  FMW:[id number] DOB: 01-09-1941 DOA: 01/26/2024 PCP: Wilfrid Dedra Maus, PA-C   Brief Narrative:   Assessment & Plan:  Principal Problem:   Acute hypoxic respiratory failure (HCC) Active Problems:   Hypothyroidism   Paroxysmal atrial fibrillation (HCC)   Hyponatremia   Pulmonary hypertension (HCC)   CAD (coronary artery disease)   AKI (acute kidney injury) (HCC)   Normocytic anemia   Influenza A with pneumonia           DVT prophylaxis: apixaban  (ELIQUIS ) tablet  5 mg     Code Status: Do not attempt resuscitation (DNR) PRE-ARREST INTERVENTIONS DESIRED  Family Communication:   Patient status:  Patient is from :  Anticipated discharge to:  Estimated DC date:   Consultants:   Procedures:  Antimicrobials:  Anti-infectives (From admission, onward)    Start     Dose/Rate Route Frequency Ordered Stop   01/27/24 1100  azithromycin  (ZITHROMAX ) 500 mg in sodium chloride  0.9 % 250 mL IVPB        500 mg 250 mL/hr over 60 Minutes Intravenous Every 24 hours 01/26/24 1847 02/01/24 1059   01/27/24 1000  cefTRIAXone  (ROCEPHIN ) 2 g in sodium chloride  0.9 % 100 mL IVPB        2 g 200 mL/hr over 30 Minutes Intravenous Every 24 hours 01/26/24 1847 02/01/24 0959   01/27/24 1000  oseltamivir  (TAMIFLU ) capsule 30 mg       Placed in Followed by Linked Group   30 mg Oral 2 times daily 01/26/24 1914 01/31/24 2159   01/26/24 2200  oseltamivir  (TAMIFLU ) capsule 75 mg  Status:  Discontinued        75 mg Oral 2 times daily 01/26/24 1905 01/26/24 1914   01/26/24 2000  oseltamivir  (TAMIFLU ) capsule 75 mg       Placed in Followed by Linked Group   75 mg Oral  Once 01/26/24 1914 01/26/24 2113   01/26/24 1015  cefTRIAXone  (ROCEPHIN ) 2 g in sodium chloride  0.9 % 100 mL IVPB        2 g 200 mL/hr over 30 Minutes Intravenous Once 01/26/24 1005 01/26/24 1156   01/26/24 1015  azithromycin  (ZITHROMAX ) 500 mg in sodium chloride  0.9 % 250 mL IVPB        500 mg 250 mL/hr over 60 Minutes Intravenous  Once 01/26/24 1005 01/26/24 1156        Subjective:   Objective: Vitals:   01/26/24 1700 01/26/24 1836 01/27/24 0300 01/27/24 0353  BP: 114/61 119/76  (!) 126/52  Pulse: 76 88  70  Resp: 19 18 20    Temp:  98 F (36.7 C)  98 F (36.7 C)  TempSrc:  Oral  Axillary  SpO2: 95% 93%  91%  Weight:      Height:        Intake/Output Summary (Last 24 hours) at 01/27/2024 0804 Last data filed at 01/27/2024 0543 Gross per 24 hour  Intake 854.75 ml  Output --  Net 854.75 ml   Filed Weights   01/26/24 0940 01/26/24 1018  Weight: 84.4 kg 84.8 kg    Examination:  General exam: Overall comfortable, not in distress HEENT: PERRL Respiratory system:  no wheezes or crackles  Cardiovascular system: S1 & S2 heard, RRR.  Gastrointestinal system: Abdomen is nondistended, soft and nontender. Central nervous system: Alert and oriented Extremities: No edema, no clubbing ,no cyanosis Skin: No rashes, no ulcers,no icterus     Data Reviewed: I have personally reviewed following labs and imaging studies  CBC: Recent Labs  Lab 01/26/24 1016  WBC 4.3  NEUTROABS 2.5  HGB 10.7*  HCT 31.9*  MCV 87.2  PLT 139*   Basic Metabolic Panel: Recent Labs  Lab 01/26/24 1016  NA 132*  K 4.7  CL 103  CO2 20*  GLUCOSE 110*  BUN 35*  CREATININE 1.45*  CALCIUM 8.4*     Recent Results (from the past 240 hours)  Resp panel by RT-PCR (RSV, Flu A&B, Covid) Anterior Nasal Swab     Status: Abnormal  Collection Time: 01/26/24  9:12 AM   Specimen: Anterior Nasal Swab  Result Value Ref Range Status   SARS Coronavirus 2 by RT PCR NEGATIVE NEGATIVE Final    Comment: (NOTE) SARS-CoV-2 target nucleic acids are NOT DETECTED.  The SARS-CoV-2 RNA is generally detectable in upper respiratory specimens during the acute phase of infection. The lowest concentration of SARS-CoV-2 viral copies this assay can detect is 138 copies/mL. A negative result does not preclude SARS-Cov-2 infection and should not be used as the sole basis for treatment  or other patient management decisions. A negative result may occur with  improper specimen collection/handling, submission of specimen other than nasopharyngeal swab, presence of viral mutation(s) within the areas targeted by this assay, and inadequate number of viral copies(<138 copies/mL). A negative result must be combined with clinical observations, patient history, and epidemiological information. The expected result is Negative.  Fact Sheet for Patients:  bloggercourse.com  Fact Sheet for Healthcare Providers:  seriousbroker.it  This test is no t yet approved or cleared by the [place]  FDA and  has been authorized for detection and/or diagnosis of SARS-CoV-2 by FDA under an Emergency Use Authorization (EUA). This EUA will remain  in effect (meaning this test can be used) for the duration of the COVID-19 declaration under Section 564(b)(1) of the Act, 21 U.S.C.section 360bbb-3(b)(1), unless the authorization is terminated  or revoked sooner.       Influenza A by PCR POSITIVE (A) NEGATIVE Final   Influenza B by PCR NEGATIVE NEGATIVE Final    Comment: (NOTE) The Xpert Xpress SARS-CoV-2/FLU/RSV plus assay is intended as an aid in the diagnosis of influenza from Nasopharyngeal swab specimens and should not be used as a sole basis for treatment. Nasal washings and aspirates are unacceptable for Xpert Xpress SARS-CoV-2/FLU/RSV testing.  Fact Sheet for Patients: bloggercourse.com  Fact Sheet for Healthcare Providers: seriousbroker.it  This test is not yet approved or cleared by the [place]  FDA and has been authorized for detection and/or diagnosis of SARS-CoV-2 by FDA under an Emergency Use Authorization (EUA). This EUA will remain in effect (meaning this test can be used) for the duration of the COVID-19 declaration under Section 564(b)(1) of the Act, 21  U.S.C. section 360bbb-3(b)(1), unless the authorization is terminated or revoked.     Resp Syncytial Virus by PCR NEGATIVE NEGATIVE Final    Comment: (NOTE) Fact Sheet for Patients: bloggercourse.com  Fact Sheet for Healthcare Providers: seriousbroker.it  This test is not yet approved or cleared by the [place]  FDA and has been authorized for detection and/or diagnosis of SARS-CoV-2 by FDA under an Emergency Use Authorization (EUA). This EUA will remain in effect (meaning this test can be used) for the duration of the COVID-19 declaration under Section 564(b)(1) of the Act, 21 U.S.C. section 360bbb-3(b)(1), unless the authorization is terminated or revoked.  Performed at [facility], [address] [place], [place] [address]   Blood Culture (routine x 2)     Status: None (Preliminary result)   Collection Time: 01/26/24 10:05 AM   Specimen: BLOOD  Result Value Ref Range Status   Specimen Description   Final    BLOOD RIGHT ANTECUBITAL Performed at [facility], [address] [place], [place] [address]    Special Requests   Final    BOTTLES DRAWN AEROBIC AND ANAEROBIC Blood Culture results may not be optimal due to an inadequate volume of blood received in culture bottles Performed at [facility]  [address], [address], [place], [place] [address]    Culture   Final    NO GROWTH < 24 HOURS Performed at [facility], [address], [place], [place] [address]    Report Status PENDING  Incomplete  Blood Culture (routine x 2)     Status: None (Preliminary result)   Collection Time: 01/26/24 10:10 AM   Specimen: BLOOD RIGHT FOREARM  Result Value Ref Range Status   Specimen Description   Final    BLOOD RIGHT FOREARM Performed at [facility], [address] [place], [place] [address]    Special Requests   Final    BOTTLES DRAWN AEROBIC ONLY Blood Culture results may not be  optimal due to an inadequate volume of blood received in culture bottles Performed at [facility], [address] [place], [place] [address]    Culture   Final    NO GROWTH < 24 HOURS Performed at [facility], [address], [place], [place] [address]    Report Status PENDING  Incomplete     Radiology Studies: CT Angio Chest PE W and/or Wo Contrast Result Date: 01/26/2024 CLINICAL DATA:  Pulmonary embolism suspected, excessive coughing, runny nose, weakness, decreased appetite. Diagnosed with strep and flu. Not getting better. EXAM: CT ANGIOGRAPHY CHEST WITH CONTRAST TECHNIQUE: Multidetector CT imaging of the chest was performed using the standard protocol during bolus administration of intravenous contrast. Multiplanar CT image reconstructions and MIPs were obtained to evaluate the vascular anatomy. RADIATION DOSE REDUCTION: This exam was performed according to the departmental dose-optimization program which includes automated exposure control, adjustment of the mA and/or kV according to patient size and/or use of iterative reconstruction technique. CONTRAST:  80mL OMNIPAQUE  IOHEXOL  350 MG/ML SOLN COMPARISON:  Same day chest radiograph and CT 03/03/2017 FINDINGS: Cardiovascular: Negative for acute pulmonary embolism. No pericardial effusion. Coronary artery and aortic atherosclerotic calcification. Ascending aorta at the upper limits of normal caliber measuring 39 mm. Mediastinum/Nodes: Trachea and esophagus are unremarkable. No thoracic adenopathy. Lungs/Pleura: Mild bronchial wall thickening. Scarring/atelectasis in the left lower lobe. Increased focus of consolidation in the anteromedial right upper lobe (series 303/image 83) compared with 03/03/2017. This measures 1.6 x 0.8 cm and is slightly increased in size compared to 2018. This is favored to represent progressive atelectasis or scarring however underlying mass or pneumonia are not excluded. No pleural effusion or  pneumothorax. Upper Abdomen: No acute abnormality. Musculoskeletal: No acute fracture. Review of the MIP images confirms the above findings. IMPRESSION: 1. Negative for acute pulmonary embolism. 2. Increased focus of consolidation in the anteromedial right upper lobe compared with 03/03/2017. This is favored to represent progressive atelectasis or scarring however underlying mass or pneumonia are not excluded. Recommend follow-up in CT in 3-6 months. 3.  Aortic Atherosclerosis (ICD10-I70.0). Electronically Signed   By: Norman Gatlin M.D.   On: 01/26/2024 14:25   DG Chest Portable 1 View Result Date: 01/26/2024 CLINICAL DATA:  cough.  Weakness. EXAM: PORTABLE CHEST 1 VIEW COMPARISON:  06/29/2023. FINDINGS: Bilateral lung fields are clear. Bilateral costophrenic angles are clear. Normal cardio-mediastinal silhouette. No acute osseous abnormalities. The soft tissues are within normal limits. IMPRESSION: No active disease. Electronically Signed   By: Ree Molt M.D.   On: 01/26/2024 12:08    Scheduled Meds:  apixaban   5 mg Oral BID   feeding supplement  237 mL Oral BID BM   levothyroxine   75 mcg Oral QAC breakfast   metoprolol   succinate  50 mg Oral Daily   oseltamivir   30 mg Oral BID   pantoprazole   40 mg Oral Daily   simvastatin   40 mg Oral QPM   spironolactone   12.5 mg Oral q morning   Continuous Infusions:  sodium chloride  Stopped (01/26/24 1157)   sodium chloride  50 mL/hr at 01/27/24 0543   azithromycin      cefTRIAXone  (ROCEPHIN )  IV       LOS: 0 days   Ivonne Mustache, MD [facility] P2/05/2024, 8:04 AM

## 2024-01-28 DIAGNOSIS — J9601 Acute respiratory failure with hypoxia: Secondary | ICD-10-CM | POA: Diagnosis not present

## 2024-01-28 LAB — BASIC METABOLIC PANEL
Anion gap: 8 (ref 5–15)
BUN: 38 mg/dL — ABNORMAL HIGH (ref 8–23)
CO2: 21 mmol/L — ABNORMAL LOW (ref 22–32)
Calcium: 8.4 mg/dL — ABNORMAL LOW (ref 8.9–10.3)
Chloride: 111 mmol/L (ref 98–111)
Creatinine, Ser: 1.25 mg/dL — ABNORMAL HIGH (ref 0.61–1.24)
GFR, Estimated: 58 mL/min — ABNORMAL LOW (ref >60)
Glucose, Bld: 102 mg/dL — ABNORMAL HIGH (ref 70–99)
Potassium: 4.6 mmol/L (ref 3.5–5.1)
Sodium: 140 mmol/L (ref 135–145)

## 2024-01-28 LAB — CBC
HCT: 31.2 % — ABNORMAL LOW (ref 39.0–52.0)
Hemoglobin: 9.9 g/dL — ABNORMAL LOW (ref 13.0–17.0)
MCH: 28.9 pg (ref 26.0–34.0)
MCHC: 31.7 g/dL (ref 30.0–36.0)
MCV: 91 fL (ref 80.0–100.0)
Platelets: 176 10*3/uL (ref 150–400)
RBC: 3.43 MIL/uL — ABNORMAL LOW (ref 4.22–5.81)
RDW: 14.1 % (ref 11.5–15.5)
WBC: 12.5 10*3/uL — ABNORMAL HIGH (ref 4.0–10.5)
nRBC: 0 % (ref 0.0–0.2)

## 2024-01-28 LAB — MAGNESIUM: Magnesium: 2.2 mg/dL (ref 1.7–2.4)

## 2024-01-28 MED ORDER — IPRATROPIUM-ALBUTEROL 0.5-2.5 (3) MG/3ML IN SOLN
3.0000 mL | Freq: Three times a day (TID) | RESPIRATORY_TRACT | Status: DC
  Administered 2024-01-28 – 2024-01-29 (×4): 3 mL via RESPIRATORY_TRACT
  Filled 2024-01-28 (×4): qty 3

## 2024-01-28 MED ORDER — SODIUM CHLORIDE 0.9 % IV SOLN: INTRAVENOUS | Status: DC

## 2024-01-28 MED ORDER — PREDNISONE 20 MG PO TABS
40.0000 mg | ORAL_TABLET | Freq: Every day | ORAL | Status: DC
  Administered 2024-01-28 – 2024-01-29 (×2): 40 mg via ORAL
  Filled 2024-01-28 (×2): qty 2

## 2024-01-28 MED ORDER — AZITHROMYCIN 250 MG PO TABS
500.0000 mg | ORAL_TABLET | Freq: Every day | ORAL | Status: DC
  Administered 2024-01-29: 500 mg via ORAL
  Filled 2024-01-28: qty 2

## 2024-01-28 NOTE — Progress Notes (Signed)
 PROGRESS NOTE  [name]  FMW:[id number] DOB: Feb 17, 1941 DOA: 01/26/2024 PCP: Wilfrid Dedra Maus, PA-C   Brief Narrative: Patient is 83 year old male with history of coronary artery disease, pulmonary hypertension, paroxysmal A-fib, PE on Eliquis , hypertension, hyperlipidemia, hypothyroidism who initially presented with med [facility] with complaint of shortness of breath, cough.  He was recently diagnosed with streptococcal infection, influenza last week.  Was placed on Z-Pak.  Continued to have shortness of breath at home.  On presentation to the ED, he was saturating 87% on room air.  Chest x-ray showed right upper lobe infiltrate consistent with pneumonia.  Patient admitted for the management of acute hypoxic respiratory secondary pneumonia/flu.  Has been weaned to room air.  Assessment & Plan:  Principal Problem:   Acute hypoxic respiratory failure (HCC) Active Problems:   Hypothyroidism   Paroxysmal atrial fibrillation (HCC)   Hyponatremia   Pulmonary hypertension (HCC)   CAD (coronary artery disease)   AKI (acute kidney injury) (HCC)   Normocytic anemia   Influenza A with pneumonia   Acute hypoxic respiratory failure secondary to community-acquired pneumonia: Presented with dyspnea, hypoxia.  Recently diagnosed with strep/flu infection.  Chest imaging showed right upper lobe pneumonia.  Likely postviral bacterial pneumonia.  Started on ceftriaxone , azithromycin .  Also on Tamiflu .  Continue bronchodilators, mucolytics.  Currently on room air.  Flu/strep infection: Diagnosed last week.  Flu still positive.  Not sure about the benefit of continuing Tamiflu  now.  Has been started though  HFrEF: Appears euvolemic.  Last echo 11/20/2020 showed EF of 40%.  Echo done here during this admission showed EF of 48%, global hypokinesis.  He needs to follow-up with his cardiologist as an outpatient.He follows at [facility]   Hypertension: Continue metoprolol .  Lisinopril on hold due  to AKI  AKI on CKD stage II: Looks like his baseline creatinine is around  1.3.  Currently kidney function at baseline.  Hypothyroidism: Continue Synthyroid  History of PE: CT negative for PE.  Continue Eliquis  for anticoagulation  Normocytic anemia: Current hemoglobin stable  Coronary artery disease: No anginal symptoms.  Continue to monitor  Hyponatremia: Mild.  Continue to monitor  Weakness: PT consulted.  Complains of weakness today         DVT prophylaxis: apixaban  (ELIQUIS ) tablet 5 mg     Code Status: Do not attempt resuscitation (DNR) PRE-ARREST INTERVENTIONS DESIRED  Family Communication: called and discussed with wife on phone on 2/6,2/7  Patient status:Inpatient  Patient is from :home  Anticipated discharge un:ynfz  Estimated DC date: Tomorrow   Consultants: None  Procedures:None  Antimicrobials:  Anti-infectives (From admission, onward)    Start     Dose/Rate Route Frequency Ordered Stop   01/27/24 1100  azithromycin  (ZITHROMAX ) 500 mg in sodium chloride  0.9 % 250 mL IVPB        500 mg 250 mL/hr over 60 Minutes Intravenous Every 24 hours 01/26/24 1847 02/01/24 1059   01/27/24 1000  cefTRIAXone  (ROCEPHIN ) 2 g in sodium chloride  0.9 % 100 mL IVPB        2 g 200 mL/hr over 30 Minutes Intravenous Every 24 hours 01/26/24 1847 02/01/24 0959   01/27/24 1000  oseltamivir  (TAMIFLU ) capsule 30 mg       Placed in Followed by Linked Group   30 mg Oral 2 times daily 01/26/24 1914 01/31/24 2159   01/26/24 2200  oseltamivir  (TAMIFLU ) capsule 75 mg  Status:  Discontinued        75 mg Oral 2  times daily 01/26/24 1905 01/26/24 1914   01/26/24 2000  oseltamivir  (TAMIFLU ) capsule 75 mg       Placed in Followed by Linked Group   75 mg Oral  Once 01/26/24 1914 01/26/24 2113   01/26/24 1015  cefTRIAXone  (ROCEPHIN ) 2 g in sodium chloride  0.9 % 100 mL IVPB        2 g 200 mL/hr over 30 Minutes Intravenous Once 01/26/24 1005 01/26/24 1156   01/26/24 1015   azithromycin  (ZITHROMAX ) 500 mg in sodium chloride  0.9 % 250 mL IVPB        500 mg 250 mL/hr over 60 Minutes Intravenous  Once 01/26/24 1005 01/26/24 1156       Subjective: Patient seen and examined at bedside today.  Hemodynamically stable.  Lying in bed.  On room air since yesterday.  He said this morning he felt weak and short of breath but not currently.  Speaking in full sentences.  Did not cough during my evaluation.  Objective: Vitals:   01/27/24 2146 01/28/24 0600 01/28/24 0932 01/28/24 1059  BP: (!) 109/48 (!) 117/54    Pulse: 80 (!) 53  72  Resp: 20     Temp: 98 F (36.7 C) 98.3 F (36.8 C)    TempSrc: Oral Oral    SpO2: 92% 91% 92%   Weight:      Height:        Intake/Output Summary (Last 24 hours) at 01/28/2024 1103 Last data filed at 01/27/2024 1800 Gross per 24 hour  Intake 590 ml  Output --  Net 590 ml   Filed Weights   01/26/24 0940 01/26/24 1018  Weight: 84.4 kg 84.8 kg    Examination:   General exam: Overall comfortable, not in distress, lying in bed HEENT: PERRL Respiratory system: Bilateral expiratory wheezing Cardiovascular system: S1 & S2 heard, RRR.  Gastrointestinal system: Abdomen is nondistended, soft and nontender. Central nervous system: Alert and oriented Extremities: No edema, no clubbing ,no cyanosis Skin: No rashes, no ulcers,no icterus      Data Reviewed: I have personally reviewed following labs and imaging studies  CBC: Recent Labs  Lab 01/26/24 1016 01/27/24 0708 01/28/24 0614  WBC 4.3 7.8 12.5*  NEUTROABS 2.5  --   --   HGB 10.7* 9.8* 9.9*  HCT 31.9* 30.4* 31.2*  MCV 87.2 89.9 91.0  PLT 139* 153 176   Basic Metabolic Panel: Recent Labs  Lab 01/26/24 1016 01/27/24 0810 01/28/24 0614  NA 132* 136 140  K 4.7 5.0 4.6  CL 103 106 111  CO2 20* 19* 21*  GLUCOSE 110* 141* 102*  BUN 35* 38* 38*  CREATININE 1.45* 1.34* 1.25*  CALCIUM 8.4* 8.6* 8.4*  MG  --   --  2.2     Recent Results (from the past 240 hours)   Resp panel by RT-PCR (RSV, Flu A&B, Covid) Anterior Nasal Swab     Status: Abnormal   Collection Time: 01/26/24  9:12 AM   Specimen: Anterior Nasal Swab  Result Value Ref Range Status   SARS Coronavirus 2 by RT PCR NEGATIVE NEGATIVE Final    Comment: (NOTE) SARS-CoV-2 target nucleic acids are NOT DETECTED.  The SARS-CoV-2 RNA is generally detectable in upper respiratory specimens during the acute phase of infection. The lowest concentration of SARS-CoV-2 viral copies this assay can detect is 138 copies/mL. A negative result does not preclude SARS-Cov-2 infection and should not be used as the sole basis for treatment or other patient management decisions. A  negative result may occur with  improper specimen collection/handling, submission of specimen other than nasopharyngeal swab, presence of viral mutation(s) within the areas targeted by this assay, and inadequate number of viral copies(<138 copies/mL). A negative result must be combined with clinical observations, patient history, and epidemiological information. The expected result is Negative.  Fact Sheet for Patients:  bloggercourse.com  Fact Sheet for Healthcare Providers:  seriousbroker.it  This test is no t yet approved or cleared by the [place]  FDA and  has been authorized for detection and/or diagnosis of SARS-CoV-2 by FDA under an Emergency Use Authorization (EUA). This EUA will remain  in effect (meaning this test can be used) for the duration of the COVID-19 declaration under Section 564(b)(1) of the Act, 21 U.S.C.section 360bbb-3(b)(1), unless the authorization is terminated  or revoked sooner.       Influenza A by PCR POSITIVE (A) NEGATIVE Final   Influenza B by PCR NEGATIVE NEGATIVE Final    Comment: (NOTE) The Xpert Xpress SARS-CoV-2/FLU/RSV plus assay is intended as an aid in the diagnosis of influenza from Nasopharyngeal swab specimens and should not  be used as a sole basis for treatment. Nasal washings and aspirates are unacceptable for Xpert Xpress SARS-CoV-2/FLU/RSV testing.  Fact Sheet for Patients: bloggercourse.com  Fact Sheet for Healthcare Providers: seriousbroker.it  This test is not yet approved or cleared by the [place]  FDA and has been authorized for detection and/or diagnosis of SARS-CoV-2 by FDA under an Emergency Use Authorization (EUA). This EUA will remain in effect (meaning this test can be used) for the duration of the COVID-19 declaration under Section 564(b)(1) of the Act, 21 U.S.C. section 360bbb-3(b)(1), unless the authorization is terminated or revoked.     Resp Syncytial Virus by PCR NEGATIVE NEGATIVE Final    Comment: (NOTE) Fact Sheet for Patients: bloggercourse.com  Fact Sheet for Healthcare Providers: seriousbroker.it  This test is not yet approved or cleared by the [place]  FDA and has been authorized for detection and/or diagnosis of SARS-CoV-2 by FDA under an Emergency Use Authorization (EUA). This EUA will remain in effect (meaning this test can be used) for the duration of the COVID-19 declaration under Section 564(b)(1) of the Act, 21 U.S.C. section 360bbb-3(b)(1), unless the authorization is terminated or revoked.  Performed at [facility], [address] [place], [place] [address]   Blood Culture (routine x 2)     Status: None (Preliminary result)   Collection Time: 01/26/24 10:05 AM   Specimen: BLOOD  Result Value Ref Range Status   Specimen Description   Final    BLOOD RIGHT ANTECUBITAL Performed at [facility], [address] [place], [place] [address]    Special Requests   Final    BOTTLES DRAWN AEROBIC AND ANAEROBIC Blood Culture results may not be optimal due to an inadequate volume of blood received in culture bottles Performed at [facility], [address] [place], [place] [address]    Culture   Final    NO GROWTH 2 DAYS Performed at [facility], [address], [place], [place] [address]    Report Status PENDING  Incomplete  Blood Culture (routine x 2)     Status: None (Preliminary result)   Collection Time: 01/26/24 10:10 AM   Specimen: BLOOD RIGHT FOREARM  Result Value Ref Range Status   Specimen Description   Final    BLOOD RIGHT FOREARM Performed at [facility], [address] [place], [place] [address]    Special Requests   Final    BOTTLES DRAWN AEROBIC ONLY Blood Culture results may not be optimal due to an inadequate volume of blood received in culture bottles Performed at [facility], [address] [place], [place] [address]    Culture   Final    NO GROWTH 2 DAYS Performed at [facility], [address], [place], [place] [address]    Report Status PENDING  Incomplete     Radiology Studies: ECHOCARDIOGRAM COMPLETE Result Date: 01/27/2024    ECHOCARDIOGRAM REPORT   Patient Name:   [name] Date of Exam: 01/27/2024 Medical Rec #:  [id number]         Height:       70.0 in Accession #:    [id number]        Weight:       186.9 lb Date of Birth:  Jul 21, 1941          BSA:          2.028 m Patient Age:    82 years          BP:           126/52 mmHg Patient Gender: M                 HR:           90 bpm. Exam Location:  Inpatient Procedure: 2D Echo, Cardiac Doppler and Color Doppler Indications:    CHF  History:        Patient has prior history of Echocardiogram examinations, most                 recent 03/12/2020. Cardiomyopathy and CHF, Carotid Disease,                 Mitral Valve Disease, Arrythmias:Atrial Fibrillation; Risk                 Factors:Former Smoker.  Sonographer:    Tillman Nora RVT RCS Referring Phys: [id number] MOHAMMAD L GARBA IMPRESSIONS  1. Left ventricular ejection fraction by 3D volume is 48 %. The left ventricle has mildly decreased function. The  left ventricle demonstrates global hypokinesis. There is mild concentric left ventricular hypertrophy. Left ventricular diastolic parameters are indeterminate.  2. Right ventricular systolic function is normal. The right ventricular size is normal. Tricuspid regurgitation signal is inadequate for assessing PA pressure.  3. Left atrial size was mildly dilated.  4. The mitral valve is normal in structure. Trivial mitral valve regurgitation. No evidence of mitral stenosis.  5. The aortic valve is normal in structure. Aortic valve regurgitation is mild to moderate. Aortic valve sclerosis is present, with no evidence of aortic valve stenosis.  6. The inferior vena cava is normal in size with greater than 50% respiratory variability, suggesting right atrial pressure of 3 mmHg. FINDINGS  Left Ventricle: Left ventricular ejection fraction by 3D volume is 48 %. The left ventricle has mildly decreased function. The left ventricle demonstrates global hypokinesis. The left ventricular internal cavity size was normal in size. There is mild concentric left ventricular hypertrophy. Left ventricular diastolic parameters are indeterminate. Right Ventricle: The right ventricular size is normal. No increase in right ventricular wall thickness. Right ventricular systolic function is normal. Tricuspid regurgitation signal is inadequate for assessing PA pressure. Left Atrium: Left atrial size was mildly dilated. Right Atrium: Right atrial size was normal in size. Pericardium: There is  no evidence of pericardial effusion. Presence of epicardial fat layer. Mitral Valve: The mitral valve is normal in structure. Trivial mitral valve regurgitation. No evidence of mitral valve stenosis. Tricuspid Valve: The tricuspid valve is normal in structure. Tricuspid valve regurgitation is not demonstrated. No evidence of tricuspid stenosis. Aortic Valve: The aortic valve is normal in structure. Aortic valve regurgitation is mild to moderate. Aortic  regurgitation PHT measures 480 msec. Aortic valve sclerosis is present, with no evidence of aortic valve stenosis. Aortic valve mean gradient measures 3.5 mmHg. Aortic valve peak gradient measures 5.6 mmHg. Aortic valve area, by VTI measures 2.18 cm. Pulmonic Valve: The pulmonic valve was normal in structure. Pulmonic valve regurgitation is not visualized. No evidence of pulmonic stenosis. Aorta: The aortic root is normal in size and structure. Venous: The inferior vena cava is normal in size with greater than 50% respiratory variability, suggesting right atrial pressure of 3 mmHg. IAS/Shunts: No atrial level shunt detected by color flow Doppler.  LEFT VENTRICLE PLAX 2D LVIDd:         5.20 cm         Diastology LVIDs:         4.60 cm         LV e' medial:    6.45 cm/s LV PW:         1.07 cm         LV E/e' medial:  13.8 LV IVS:        1.13 cm         LV e' lateral:   8.70 cm/s LVOT diam:     2.10 cm         LV E/e' lateral: 10.2 LV SV:         59 LV SV Index:   29 LVOT Area:     3.46 cm        3D Volume EF                                LV 3D EF:    Left                                             ventricul                                             ar                                             ejection                                             fraction                                             by 3D  volume is                                             48 %.                                 3D Volume EF:                                3D EF:        48 %                                LV EDV:       195 ml                                LV ESV:       102 ml                                LV SV:        94 ml RIGHT VENTRICLE             IVC RV Basal diam:  3.60 cm     IVC diam: 2.50 cm RV S prime:     14.50 cm/s TAPSE (M-mode): 3.3 cm LEFT ATRIUM             Index        RIGHT ATRIUM           Index LA Vol (A2C):   65.8 ml 32.44 ml/m  RA Area:     10.90 cm LA Vol  (A4C):   85.0 ml 41.91 ml/m  RA Volume:   22.80 ml  11.24 ml/m LA Biplane Vol: 75.3 ml 37.12 ml/m  AORTIC VALVE                    PULMONIC VALVE AV Area (Vmax):    2.86 cm     PV Vmax:       0.99 m/s AV Area (Vmean):   2.70 cm     PV Peak grad:  3.9 mmHg AV Area (VTI):     2.18 cm AV Vmax:           118.50 cm/s AV Vmean:          83.350 cm/s AV VTI:            0.270 m AV Peak Grad:      5.6 mmHg AV Mean Grad:      3.5 mmHg LVOT Vmax:         97.80 cm/s LVOT Vmean:        64.900 cm/s LVOT VTI:          0.169 m LVOT/AV VTI ratio: 0.63 AI PHT:            480 msec AR Vena Contracta: 1.10 cm  AORTA Ao Root diam: 3.40 cm MITRAL VALVE MV Area (PHT): 3.54 cm     SHUNTS MV Decel Time: 214 msec     Systemic VTI:  0.17 m MV E  velocity: 88.80 cm/s   Systemic Diam: 2.10 cm MV A velocity: 102.00 cm/s MV E/A ratio:  0.87 Kardie Tobb DO Electronically signed by Dub Huntsman DO Signature Date/Time: 01/27/2024/11:20:13 AM    Final    CT Angio Chest PE W and/or Wo Contrast Result Date: 01/26/2024 CLINICAL DATA:  Pulmonary embolism suspected, excessive coughing, runny nose, weakness, decreased appetite. Diagnosed with strep and flu. Not getting better. EXAM: CT ANGIOGRAPHY CHEST WITH CONTRAST TECHNIQUE: Multidetector CT imaging of the chest was performed using the standard protocol during bolus administration of intravenous contrast. Multiplanar CT image reconstructions and MIPs were obtained to evaluate the vascular anatomy. RADIATION DOSE REDUCTION: This exam was performed according to the departmental dose-optimization program which includes automated exposure control, adjustment of the mA and/or kV according to patient size and/or use of iterative reconstruction technique. CONTRAST:  80mL OMNIPAQUE  IOHEXOL  350 MG/ML SOLN COMPARISON:  Same day chest radiograph and CT 03/03/2017 FINDINGS: Cardiovascular: Negative for acute pulmonary embolism. No pericardial effusion. Coronary artery and aortic atherosclerotic calcification.  Ascending aorta at the upper limits of normal caliber measuring 39 mm. Mediastinum/Nodes: Trachea and esophagus are unremarkable. No thoracic adenopathy. Lungs/Pleura: Mild bronchial wall thickening. Scarring/atelectasis in the left lower lobe. Increased focus of consolidation in the anteromedial right upper lobe (series 303/image 83) compared with 03/03/2017. This measures 1.6 x 0.8 cm and is slightly increased in size compared to 2018. This is favored to represent progressive atelectasis or scarring however underlying mass or pneumonia are not excluded. No pleural effusion or pneumothorax. Upper Abdomen: No acute abnormality. Musculoskeletal: No acute fracture. Review of the MIP images confirms the above findings. IMPRESSION: 1. Negative for acute pulmonary embolism. 2. Increased focus of consolidation in the anteromedial right upper lobe compared with 03/03/2017. This is favored to represent progressive atelectasis or scarring however underlying mass or pneumonia are not excluded. Recommend follow-up in CT in 3-6 months. 3.  Aortic Atherosclerosis (ICD10-I70.0). Electronically Signed   By: Norman Gatlin M.D.   On: 01/26/2024 14:25    Scheduled Meds:  apixaban   5 mg Oral BID   feeding supplement  237 mL Oral BID BM   guaiFENesin   600 mg Oral BID   ipratropium-albuterol   3 mL Nebulization TID   levothyroxine   75 mcg Oral QAC breakfast   metoprolol  succinate  50 mg Oral Daily   oseltamivir   30 mg Oral BID   pantoprazole   40 mg Oral Daily   predniSONE   40 mg Oral Q breakfast   simvastatin   40 mg Oral QPM   Continuous Infusions:  sodium chloride  50 mL/hr at 01/28/24 1102   azithromycin  500 mg (01/27/24 1124)   cefTRIAXone  (ROCEPHIN )  IV 2 g (01/27/24 1025)     LOS: 1 day   Ivonne Mustache, MD [facility] P2/06/2024, 11:03 AM  PROGRESS NOTE  [name]  FMW:[id number] DOB: 05-01-1941 DOA: 01/26/2024 PCP: Wilfrid Dedra Maus, PA-C   Brief Narrative:   Assessment &  Plan:  Principal Problem:   Acute hypoxic respiratory failure (HCC) Active Problems:   Hypothyroidism   Paroxysmal atrial fibrillation (HCC)   Hyponatremia   Pulmonary hypertension (HCC)   CAD (coronary artery disease)   AKI (acute kidney injury) (HCC)   Normocytic anemia   Influenza A with pneumonia           DVT prophylaxis: apixaban  (ELIQUIS ) tablet 5 mg     Code Status: Do not attempt resuscitation (DNR) PRE-ARREST INTERVENTIONS DESIRED  Family Communication:   Patient status:  Patient is  from :  Anticipated discharge to:  Estimated DC date:   Consultants:   Procedures:  Antimicrobials:  Anti-infectives (From admission, onward)    Start     Dose/Rate Route Frequency Ordered Stop   01/27/24 1100  azithromycin  (ZITHROMAX ) 500 mg in sodium chloride  0.9 % 250 mL IVPB        500 mg 250 mL/hr over 60 Minutes Intravenous Every 24 hours 01/26/24 1847 02/01/24 1059   01/27/24 1000  cefTRIAXone  (ROCEPHIN ) 2 g in sodium chloride  0.9 % 100 mL IVPB        2 g 200 mL/hr over 30 Minutes Intravenous Every 24 hours 01/26/24 1847 02/01/24 0959   01/27/24 1000  oseltamivir  (TAMIFLU ) capsule 30 mg       Placed in Followed by Linked Group   30 mg Oral 2 times daily 01/26/24 1914 01/31/24 2159   01/26/24 2200  oseltamivir  (TAMIFLU ) capsule 75 mg  Status:  Discontinued        75 mg Oral 2 times daily 01/26/24 1905 01/26/24 1914   01/26/24 2000  oseltamivir  (TAMIFLU ) capsule 75 mg       Placed in Followed by Linked Group   75 mg Oral  Once 01/26/24 1914 01/26/24 2113   01/26/24 1015  cefTRIAXone  (ROCEPHIN ) 2 g in sodium chloride  0.9 % 100 mL IVPB        2 g 200 mL/hr over 30 Minutes Intravenous Once 01/26/24 1005 01/26/24 1156   01/26/24 1015  azithromycin  (ZITHROMAX ) 500 mg in sodium chloride  0.9 % 250 mL IVPB        500 mg 250 mL/hr over 60 Minutes Intravenous  Once 01/26/24 1005 01/26/24 1156       Subjective:   Objective: Vitals:   01/27/24 2146 01/28/24  0600 01/28/24 0932 01/28/24 1059  BP: (!) 109/48 (!) 117/54    Pulse: 80 (!) 53  72  Resp: 20     Temp: 98 F (36.7 C) 98.3 F (36.8 C)    TempSrc: Oral Oral    SpO2: 92% 91% 92%   Weight:      Height:        Intake/Output Summary (Last 24 hours) at 01/28/2024 1103 Last data filed at 01/27/2024 1800 Gross per 24 hour  Intake 590 ml  Output --  Net 590 ml   Filed Weights   01/26/24 0940 01/26/24 1018  Weight: 84.4 kg 84.8 kg    Examination:  General exam: Overall comfortable, not in distress HEENT: PERRL Respiratory system:  no wheezes or crackles  Cardiovascular system: S1 & S2 heard, RRR.  Gastrointestinal system: Abdomen is nondistended, soft and nontender. Central nervous system: Alert and oriented Extremities: No edema, no clubbing ,no cyanosis Skin: No rashes, no ulcers,no icterus     Data Reviewed: I have personally reviewed following labs and imaging studies  CBC: Recent Labs  Lab 01/26/24 1016 01/27/24 0708 01/28/24 0614  WBC 4.3 7.8 12.5*  NEUTROABS 2.5  --   --   HGB 10.7* 9.8* 9.9*  HCT 31.9* 30.4* 31.2*  MCV 87.2 89.9 91.0  PLT 139* 153 176   Basic Metabolic Panel: Recent Labs  Lab 01/26/24 1016 01/27/24 0810 01/28/24 0614  NA 132* 136 140  K 4.7 5.0 4.6  CL 103 106 111  CO2 20* 19* 21*  GLUCOSE 110* 141* 102*  BUN 35* 38* 38*  CREATININE 1.45* 1.34* 1.25*  CALCIUM 8.4* 8.6* 8.4*  MG  --   --  2.2  Recent Results (from the past 240 hours)  Resp panel by RT-PCR (RSV, Flu A&B, Covid) Anterior Nasal Swab     Status: Abnormal   Collection Time: 01/26/24  9:12 AM   Specimen: Anterior Nasal Swab  Result Value Ref Range Status   SARS Coronavirus 2 by RT PCR NEGATIVE NEGATIVE Final    Comment: (NOTE) SARS-CoV-2 target nucleic acids are NOT DETECTED.  The SARS-CoV-2 RNA is generally detectable in upper respiratory specimens during the acute phase of infection. The lowest concentration of SARS-CoV-2 viral copies this assay can detect  is 138 copies/mL. A negative result does not preclude SARS-Cov-2 infection and should not be used as the sole basis for treatment or other patient management decisions. A negative result may occur with  improper specimen collection/handling, submission of specimen other than nasopharyngeal swab, presence of viral mutation(s) within the areas targeted by this assay, and inadequate number of viral copies(<138 copies/mL). A negative result must be combined with clinical observations, patient history, and epidemiological information. The expected result is Negative.  Fact Sheet for Patients:  bloggercourse.com  Fact Sheet for Healthcare Providers:  seriousbroker.it  This test is no t yet approved or cleared by the [place]  FDA and  has been authorized for detection and/or diagnosis of SARS-CoV-2 by FDA under an Emergency Use Authorization (EUA). This EUA will remain  in effect (meaning this test can be used) for the duration of the COVID-19 declaration under Section 564(b)(1) of the Act, 21 U.S.C.section 360bbb-3(b)(1), unless the authorization is terminated  or revoked sooner.       Influenza A by PCR POSITIVE (A) NEGATIVE Final   Influenza B by PCR NEGATIVE NEGATIVE Final    Comment: (NOTE) The Xpert Xpress SARS-CoV-2/FLU/RSV plus assay is intended as an aid in the diagnosis of influenza from Nasopharyngeal swab specimens and should not be used as a sole basis for treatment. Nasal washings and aspirates are unacceptable for Xpert Xpress SARS-CoV-2/FLU/RSV testing.  Fact Sheet for Patients: bloggercourse.com  Fact Sheet for Healthcare Providers: seriousbroker.it  This test is not yet approved or cleared by the [place]  FDA and has been authorized for detection and/or diagnosis of SARS-CoV-2 by FDA under an Emergency Use Authorization (EUA). This EUA will remain in  effect (meaning this test can be used) for the duration of the COVID-19 declaration under Section 564(b)(1) of the Act, 21 U.S.C. section 360bbb-3(b)(1), unless the authorization is terminated or revoked.     Resp Syncytial Virus by PCR NEGATIVE NEGATIVE Final    Comment: (NOTE) Fact Sheet for Patients: bloggercourse.com  Fact Sheet for Healthcare Providers: seriousbroker.it  This test is not yet approved or cleared by the [place]  FDA and has been authorized for detection and/or diagnosis of SARS-CoV-2 by FDA under an Emergency Use Authorization (EUA). This EUA will remain in effect (meaning this test can be used) for the duration of the COVID-19 declaration under Section 564(b)(1) of the Act, 21 U.S.C. section 360bbb-3(b)(1), unless the authorization is terminated or revoked.  Performed at [facility], [address] [place], [place] [address]   Blood Culture (routine x 2)     Status: None (Preliminary result)   Collection Time: 01/26/24 10:05 AM   Specimen: BLOOD  Result Value Ref Range Status   Specimen Description   Final    BLOOD RIGHT ANTECUBITAL Performed at [facility], [address], [place], [place] [address]    Special Requests   Final    BOTTLES  DRAWN AEROBIC AND ANAEROBIC Blood Culture results may not be optimal due to an inadequate volume of blood received in culture bottles Performed at [facility], [address] [place], [place] [address]    Culture   Final    NO GROWTH 2 DAYS Performed at [facility], [address] [place], [place] [address]    Report Status PENDING  Incomplete  Blood Culture (routine x 2)     Status: None (Preliminary result)   Collection Time: 01/26/24 10:10 AM   Specimen: BLOOD RIGHT FOREARM  Result Value Ref Range Status   Specimen Description   Final    BLOOD RIGHT FOREARM Performed at [facility], [address]  [place], [place] [address]    Special Requests   Final    BOTTLES DRAWN AEROBIC ONLY Blood Culture results may not be optimal due to an inadequate volume of blood received in culture bottles Performed at [facility], [address] [place], [place] [address]    Culture   Final    NO GROWTH 2 DAYS Performed at [facility], [address] [place], [place] [address]    Report Status PENDING  Incomplete     Radiology Studies: ECHOCARDIOGRAM COMPLETE Result Date: 01/27/2024    ECHOCARDIOGRAM REPORT   Patient Name:   [name] Date of Exam: 01/27/2024 Medical Rec #:  [id number]         Height:       70.0 in Accession #:    [id number]        Weight:       186.9 lb Date of Birth:  1941-03-05          BSA:          2.028 m Patient Age:    82 years          BP:           126/52 mmHg Patient Gender: M                 HR:           90 bpm. Exam Location:  Inpatient Procedure: 2D Echo, Cardiac Doppler and Color Doppler Indications:    CHF  History:        Patient has prior history of Echocardiogram examinations, most                 recent 03/12/2020. Cardiomyopathy and CHF, Carotid Disease,                 Mitral Valve Disease, Arrythmias:Atrial Fibrillation; Risk                 Factors:Former Smoker.  Sonographer:    Tillman Nora RVT RCS Referring Phys: [id number] MOHAMMAD L GARBA IMPRESSIONS  1. Left ventricular ejection fraction by 3D volume is 48 %. The left ventricle has mildly decreased function. The left ventricle demonstrates global hypokinesis. There is mild concentric left ventricular hypertrophy. Left ventricular diastolic parameters are indeterminate.  2. Right ventricular systolic function is normal. The right ventricular size is normal. Tricuspid regurgitation signal is inadequate for assessing PA pressure.  3. Left atrial size was mildly dilated.  4. The mitral valve is normal in structure. Trivial mitral valve regurgitation. No evidence of mitral stenosis.  5. The aortic valve is normal  in structure. Aortic valve regurgitation is mild to moderate. Aortic valve sclerosis is present, with no evidence of aortic  valve stenosis.  6. The inferior vena cava is normal in size with greater than 50% respiratory variability, suggesting right atrial pressure of 3 mmHg. FINDINGS  Left Ventricle: Left ventricular ejection fraction by 3D volume is 48 %. The left ventricle has mildly decreased function. The left ventricle demonstrates global hypokinesis. The left ventricular internal cavity size was normal in size. There is mild concentric left ventricular hypertrophy. Left ventricular diastolic parameters are indeterminate. Right Ventricle: The right ventricular size is normal. No increase in right ventricular wall thickness. Right ventricular systolic function is normal. Tricuspid regurgitation signal is inadequate for assessing PA pressure. Left Atrium: Left atrial size was mildly dilated. Right Atrium: Right atrial size was normal in size. Pericardium: There is no evidence of pericardial effusion. Presence of epicardial fat layer. Mitral Valve: The mitral valve is normal in structure. Trivial mitral valve regurgitation. No evidence of mitral valve stenosis. Tricuspid Valve: The tricuspid valve is normal in structure. Tricuspid valve regurgitation is not demonstrated. No evidence of tricuspid stenosis. Aortic Valve: The aortic valve is normal in structure. Aortic valve regurgitation is mild to moderate. Aortic regurgitation PHT measures 480 msec. Aortic valve sclerosis is present, with no evidence of aortic valve stenosis. Aortic valve mean gradient measures 3.5 mmHg. Aortic valve peak gradient measures 5.6 mmHg. Aortic valve area, by VTI measures 2.18 cm. Pulmonic Valve: The pulmonic valve was normal in structure. Pulmonic valve regurgitation is not visualized. No evidence of pulmonic stenosis. Aorta: The aortic root is normal in size and structure. Venous: The inferior vena cava is normal in size with  greater than 50% respiratory variability, suggesting right atrial pressure of 3 mmHg. IAS/Shunts: No atrial level shunt detected by color flow Doppler.  LEFT VENTRICLE PLAX 2D LVIDd:         5.20 cm         Diastology LVIDs:         4.60 cm         LV e' medial:    6.45 cm/s LV PW:         1.07 cm         LV E/e' medial:  13.8 LV IVS:        1.13 cm         LV e' lateral:   8.70 cm/s LVOT diam:     2.10 cm         LV E/e' lateral: 10.2 LV SV:         59 LV SV Index:   29 LVOT Area:     3.46 cm        3D Volume EF                                LV 3D EF:    Left                                             ventricul                                             ar  ejection                                             fraction                                             by 3D                                             volume is                                             48 %.                                 3D Volume EF:                                3D EF:        48 %                                LV EDV:       195 ml                                LV ESV:       102 ml                                LV SV:        94 ml RIGHT VENTRICLE             IVC RV Basal diam:  3.60 cm     IVC diam: 2.50 cm RV S prime:     14.50 cm/s TAPSE (M-mode): 3.3 cm LEFT ATRIUM             Index        RIGHT ATRIUM           Index LA Vol (A2C):   65.8 ml 32.44 ml/m  RA Area:     10.90 cm LA Vol (A4C):   85.0 ml 41.91 ml/m  RA Volume:   22.80 ml  11.24 ml/m LA Biplane Vol: 75.3 ml 37.12 ml/m  AORTIC VALVE                    PULMONIC VALVE AV Area (Vmax):    2.86 cm     PV Vmax:       0.99 m/s AV Area (Vmean):   2.70 cm     PV Peak grad:  3.9 mmHg AV Area (VTI):     2.18 cm AV Vmax:           118.50 cm/s AV Vmean:  83.350 cm/s AV VTI:            0.270 m AV Peak Grad:      5.6 mmHg AV Mean Grad:      3.5 mmHg LVOT Vmax:         97.80 cm/s LVOT Vmean:        64.900 cm/s LVOT VTI:           0.169 m LVOT/AV VTI ratio: 0.63 AI PHT:            480 msec AR Vena Contracta: 1.10 cm  AORTA Ao Root diam: 3.40 cm MITRAL VALVE MV Area (PHT): 3.54 cm     SHUNTS MV Decel Time: 214 msec     Systemic VTI:  0.17 m MV E velocity: 88.80 cm/s   Systemic Diam: 2.10 cm MV A velocity: 102.00 cm/s MV E/A ratio:  0.87 Kardie Tobb DO Electronically signed by Dub Huntsman DO Signature Date/Time: 01/27/2024/11:20:13 AM    Final    CT Angio Chest PE W and/or Wo Contrast Result Date: 01/26/2024 CLINICAL DATA:  Pulmonary embolism suspected, excessive coughing, runny nose, weakness, decreased appetite. Diagnosed with strep and flu. Not getting better. EXAM: CT ANGIOGRAPHY CHEST WITH CONTRAST TECHNIQUE: Multidetector CT imaging of the chest was performed using the standard protocol during bolus administration of intravenous contrast. Multiplanar CT image reconstructions and MIPs were obtained to evaluate the vascular anatomy. RADIATION DOSE REDUCTION: This exam was performed according to the departmental dose-optimization program which includes automated exposure control, adjustment of the mA and/or kV according to patient size and/or use of iterative reconstruction technique. CONTRAST:  80mL OMNIPAQUE  IOHEXOL  350 MG/ML SOLN COMPARISON:  Same day chest radiograph and CT 03/03/2017 FINDINGS: Cardiovascular: Negative for acute pulmonary embolism. No pericardial effusion. Coronary artery and aortic atherosclerotic calcification. Ascending aorta at the upper limits of normal caliber measuring 39 mm. Mediastinum/Nodes: Trachea and esophagus are unremarkable. No thoracic adenopathy. Lungs/Pleura: Mild bronchial wall thickening. Scarring/atelectasis in the left lower lobe. Increased focus of consolidation in the anteromedial right upper lobe (series 303/image 83) compared with 03/03/2017. This measures 1.6 x 0.8 cm and is slightly increased in size compared to 2018. This is favored to represent progressive atelectasis or scarring  however underlying mass or pneumonia are not excluded. No pleural effusion or pneumothorax. Upper Abdomen: No acute abnormality. Musculoskeletal: No acute fracture. Review of the MIP images confirms the above findings. IMPRESSION: 1. Negative for acute pulmonary embolism. 2. Increased focus of consolidation in the anteromedial right upper lobe compared with 03/03/2017. This is favored to represent progressive atelectasis or scarring however underlying mass or pneumonia are not excluded. Recommend follow-up in CT in 3-6 months. 3.  Aortic Atherosclerosis (ICD10-I70.0). Electronically Signed   By: Norman Gatlin M.D.   On: 01/26/2024 14:25    Scheduled Meds:  apixaban   5 mg Oral BID   feeding supplement  237 mL Oral BID BM   guaiFENesin   600 mg Oral BID   ipratropium-albuterol   3 mL Nebulization TID   levothyroxine   75 mcg Oral QAC breakfast   metoprolol  succinate  50 mg Oral Daily   oseltamivir   30 mg Oral BID   pantoprazole   40 mg Oral Daily   predniSONE   40 mg Oral Q breakfast   simvastatin   40 mg Oral QPM   Continuous Infusions:  sodium chloride  50 mL/hr at 01/28/24 1102   azithromycin  500 mg (01/27/24 1124)   cefTRIAXone  (ROCEPHIN )  IV 2 g (01/27/24 1025)     LOS:  1 day   Ivonne Mustache, MD [facility] P2/06/2024, 11:03 AM

## 2024-01-28 NOTE — Evaluation (Signed)
 Physical Therapy Evaluation Patient Details Name: [name] MRN: [id number] DOB: [birth year] Today's Date: 01/28/2024  History of Present Illness  83 yo male presents to therapy following hospital admission on 01/26/2024 with reports of SOB and generalized weakness due to acute hypoxic respiratory failure secondary to CAP with underlying flu. Pt found to be hypoxic in ED 87% on RA and required supplemental O2. Pt had been previously treated for streptococcal infection and flu last week at [facility] and placed on Z-pack. Pt PMH includes but is not limited to: CAD s/p cath, HOH, sCHF, LV dysfunction, abdominal wall deficit, OA, anemia, PE, colectomy, and B THA.  Clinical Impression   Pt admitted with above diagnosis.  Pt currently with functional limitations due to the deficits listed below (see PT Problem List). Pt in bed when PT arrived. Pt stated he had recently been administered a breathing treatment and O2 89% when RT arrived. Pt on 2 L/min in bed and 93%. At rest on RA 90% and no reports of SOB. Pt is S and min cues for bed mobility and transfer tasks, CGA for gait tasks with RW for energy conservation, min cues for 200 feet pt desaturated to 88% on RA with exertion and required seated therapeutic rest break and cues for pursed lip breathing and 47s to recover to 90% on RA. Pt left on RA and 90% and nurse aware. Pt left in recliner all needs in place. Pt will benefit from acute skilled PT to increase their independence and safety with mobility to allow discharge.         If plan is discharge home, recommend the following: A little help with walking and/or transfers;A little help with bathing/dressing/bathroom;Assistance with cooking/housework;Assist for transportation   Can travel by private vehicle        Equipment Recommendations    Recommendations for Other Services       Functional Status Assessment Patient has had a recent decline in their functional status and  demonstrates the ability to make significant improvements in function in a reasonable and predictable amount of time.     Precautions / Restrictions Precautions Precautions: Fall Restrictions Weight Bearing Restrictions Per Provider Order: No      Mobility  Bed Mobility Overal bed mobility: Needs Assistance Bed Mobility: Supine to Sit     Supine to sit: Supervision, HOB elevated     General bed mobility comments: min cues    Transfers Overall transfer level: Needs assistance Equipment used: Rolling walker (2 wheels) Transfers: Sit to/from Stand Sit to Stand: Supervision           General transfer comment: min cues    Ambulation/Gait Ambulation/Gait assistance: Contact guard assist, Supervision Gait Distance (Feet): 200 Feet Assistive device: Rolling walker (2 wheels) Gait Pattern/deviations: Step-through pattern Gait velocity: decreased     General Gait Details: min cues for safety and RW management  Stairs            Wheelchair Mobility     Tilt Bed    Modified Rankin (Stroke Patients Only)       Balance Overall balance assessment: Mild deficits observed, not formally tested                                           Pertinent Vitals/Pain Pain Assessment Pain Assessment: No/denies pain    Home Living Family/patient expects to be discharged  to:: Private residence Living Arrangements: Spouse/significant other Available Help at Discharge: Family Type of Home: House Home Access: Level entry       Home Layout: Two level;Able to live on main level with bedroom/bathroom Home Equipment: Rolling Walker (2 wheels) Additional Comments: active and goes to the gym    Prior Function Prior Level of Function : Independent/Modified Independent;Driving             Mobility Comments: IND no AD with all ADLs, self care tasks       Extremity/Trunk Assessment        Lower Extremity Assessment Lower Extremity Assessment:  Overall WFL for tasks assessed    Cervical / Trunk Assessment Cervical / Trunk Assessment: Normal  Communication   Communication Communication: No apparent difficulties;Hearing impairment (B hearing aids)  Cognition Arousal: Alert Behavior During Therapy: WFL for tasks assessed/performed Overall Cognitive Status: Within Functional Limits for tasks assessed                                          General Comments General comments (skin integrity, edema, etc.): pt on 2 L/min and 93% when PT arrived, remaining portion of evaluation conducted on RA    Exercises     Assessment/Plan    PT Assessment Patient needs continued PT services  PT Problem List Decreased activity tolerance;Decreased balance;Decreased mobility;Decreased coordination       PT Treatment Interventions DME instruction;Gait training;Functional mobility training;Therapeutic activities;Therapeutic exercise;Balance training;Neuromuscular re-education;Patient/family education    PT Goals (Current goals can be found in the Care Plan section)  Acute Rehab PT Goals Patient Stated Goal: to get back home and do as little as possible PT Goal Formulation: With patient Time For Goal Achievement: 02/11/24 Potential to Achieve Goals: Good    Frequency Min 1X/week     Co-evaluation               AM-PAC PT 6 Clicks Mobility  Outcome Measure Help needed turning from your back to your side while in a flat bed without using bedrails?: None Help needed moving from lying on your back to sitting on the side of a flat bed without using bedrails?: A Little Help needed moving to and from a bed to a chair (including a wheelchair)?: A Little Help needed standing up from a chair using your arms (e.g., wheelchair or bedside chair)?: A Little Help needed to walk in hospital room?: A Little Help needed climbing 3-5 steps with a railing? : A Lot 6 Click Score: 18    End of Session Equipment Utilized During  Treatment: Gait belt Activity Tolerance: Patient tolerated treatment well Patient left: in chair;with call bell/phone within reach;with nursing/sitter in room Nurse Communication: Mobility status;Other (comment) (O2 saturation) PT Visit Diagnosis: Unsteadiness on feet (R26.81);Other abnormalities of gait and mobility (R26.89);Difficulty in walking, not elsewhere classified (R26.2)    Time: 8482-8458 PT Time Calculation (min) (ACUTE ONLY): 24 min   Charges:   PT Evaluation $PT Eval Low Complexity: 1 Low PT Treatments $Gait Training: 8-22 mins PT General Charges $$ ACUTE PT VISIT: 1 Visit         [place], PT Acute Rehab   [place] VEAR Drone 01/28/2024, 4:48 PM

## 2024-01-28 NOTE — Progress Notes (Signed)
   01/28/24 1541  TOC Brief Assessment  Insurance and Status Reviewed  Patient has primary care physician Yes ([facility], Dedra Maus, PA-C)  Home environment has been reviewed yes (from home with spouse)  Prior level of function: Independent  Prior/Current Home Services No current home services  Social Drivers of Health Review SDOH reviewed no interventions necessary  Readmission risk has been reviewed Yes  Transition of care needs no transition of care needs at this time

## 2024-01-29 DIAGNOSIS — J9601 Acute respiratory failure with hypoxia: Secondary | ICD-10-CM | POA: Diagnosis not present

## 2024-01-29 LAB — CBC
HCT: 30.4 % — ABNORMAL LOW (ref 39.0–52.0)
Hemoglobin: 9.7 g/dL — ABNORMAL LOW (ref 13.0–17.0)
MCH: 28.6 pg (ref 26.0–34.0)
MCHC: 31.9 g/dL (ref 30.0–36.0)
MCV: 89.7 fL (ref 80.0–100.0)
Platelets: 194 10*3/uL (ref 150–400)
RBC: 3.39 MIL/uL — ABNORMAL LOW (ref 4.22–5.81)
RDW: 13.9 % (ref 11.5–15.5)
WBC: 9.1 10*3/uL (ref 4.0–10.5)
nRBC: 0 % (ref 0.0–0.2)

## 2024-01-29 LAB — BASIC METABOLIC PANEL
Anion gap: 12 (ref 5–15)
BUN: 31 mg/dL — ABNORMAL HIGH (ref 8–23)
CO2: 17 mmol/L — ABNORMAL LOW (ref 22–32)
Calcium: 8.3 mg/dL — ABNORMAL LOW (ref 8.9–10.3)
Chloride: 113 mmol/L — ABNORMAL HIGH (ref 98–111)
Creatinine, Ser: 1.21 mg/dL (ref 0.61–1.24)
GFR, Estimated: >60 mL/min (ref >60)
Glucose, Bld: 111 mg/dL — ABNORMAL HIGH (ref 70–99)
Potassium: 4.2 mmol/L (ref 3.5–5.1)
Sodium: 142 mmol/L (ref 135–145)

## 2024-01-29 MED ORDER — AMOXICILLIN-POT CLAVULANATE 875-125 MG PO TABS: 1.0000 | ORAL_TABLET | Freq: Two times a day (BID) | ORAL | 0 refills | Status: AC

## 2024-01-29 MED ORDER — OSELTAMIVIR PHOSPHATE 30 MG PO CAPS: 30.0000 mg | ORAL_CAPSULE | Freq: Two times a day (BID) | ORAL | 0 refills | Status: AC

## 2024-01-29 MED ORDER — GUAIFENESIN ER 600 MG PO TB12: 600.0000 mg | ORAL_TABLET | Freq: Two times a day (BID) | ORAL | 0 refills | Status: AC

## 2024-01-29 MED ORDER — PREDNISONE 20 MG PO TABS: 40.0000 mg | ORAL_TABLET | Freq: Every day | ORAL | 0 refills | Status: AC

## 2024-01-29 MED ORDER — ALBUTEROL SULFATE (2.5 MG/3ML) 0.083% IN NEBU: 2.5000 mg | INHALATION_SOLUTION | RESPIRATORY_TRACT | Status: DC | PRN

## 2024-01-29 NOTE — Progress Notes (Signed)
 Physical Therapy Treatment Patient Details Name: [name] MRN: [id number] DOB: [birth year] Today's Date: 01/29/2024   History of Present Illness 83 yo male presents to therapy following hospital admission on 01/26/2024 with reports of SOB and generalized weakness due to acute hypoxic respiratory failure secondary to CAP with underlying flu. Pt found to be hypoxic in ED 87% on RA and required supplemental O2. Pt had been previously treated for streptococcal infection and flu last week at [facility] and placed on Z-pack. Pt PMH includes but is not limited to: CAD s/p cath, HOH, sCHF, LV dysfunction, abdominal wall deficit, OA, anemia, PE, colectomy, and B THA.    PT Comments  Pt received in recliner in good spirits and agreeable to be seen. Demonstrated IND for transfers and ambulation 374ft without device at SUP, no overt LOB noted nor physical assist required, SpO2 93% on RA during ambulation, HR106. Educated pt on activity tolerance and allowing time for recovery periods as well as progressive walking program. All goals met and education completed. Pt is discharged from acute PT, should needs change please reconsult. Thank you for this referral.     If plan is discharge home, recommend the following: A little help with walking and/or transfers;A little help with bathing/dressing/bathroom;Assistance with cooking/housework;Assist for transportation   Can travel by private vehicle        Equipment Recommendations  None recommended by PT    Recommendations for Other Services       Precautions / Restrictions Precautions Precautions: Fall Restrictions Weight Bearing Restrictions Per Provider Order: No     Mobility  Bed Mobility               General bed mobility comments: OOB in recliner at entry and exit    Transfers Overall transfer level: Independent Equipment used: Rolling walker (2 wheels), None Transfers: Sit to/from Stand Sit to Stand: Independent                 Ambulation/Gait Ambulation/Gait assistance: Supervision Gait Distance (Feet): 350 Feet Assistive device: None Gait Pattern/deviations: WFL(Within Functional Limits) Gait velocity: decreased     General Gait Details: Pt ambulated with SUP without AD with reduced stride length but step through pattern, reporting mild SOB and fatigue but that I am being careful and using common sense.   Stairs             Wheelchair Mobility     Tilt Bed    Modified Rankin (Stroke Patients Only)       Balance Overall balance assessment: Mild deficits observed, not formally tested                                          Cognition Arousal: Alert Behavior During Therapy: WFL for tasks assessed/performed Overall Cognitive Status: Within Functional Limits for tasks assessed                                          Exercises      General Comments General comments (skin integrity, edema, etc.): RA, 93% during ambulation with HR106      Pertinent Vitals/Pain Pain Assessment Pain Assessment: No/denies pain    Home Living  Prior Function            PT Goals (current goals can now be found in the care plan section) Acute Rehab PT Goals Patient Stated Goal: to get back home and do as little as possible PT Goal Formulation: With patient Time For Goal Achievement: 02/11/24 Potential to Achieve Goals: Good Progress towards PT goals: Goals met/education completed, patient discharged from PT    Frequency    Min 1X/week      PT Plan      Co-evaluation              AM-PAC PT 6 Clicks Mobility   Outcome Measure  Help needed turning from your back to your side while in a flat bed without using bedrails?: None Help needed moving from lying on your back to sitting on the side of a flat bed without using bedrails?: A Little Help needed moving to and from a bed to a chair (including a  wheelchair)?: A Little Help needed standing up from a chair using your arms (e.g., wheelchair or bedside chair)?: None Help needed to walk in hospital room?: A Little Help needed climbing 3-5 steps with a railing? : A Little 6 Click Score: 20    End of Session Equipment Utilized During Treatment: Gait belt Activity Tolerance: Patient tolerated treatment well;No increased pain Patient left: in chair;with call bell/phone within reach Nurse Communication: Mobility status;Other (comment) (O2 saturation) PT Visit Diagnosis: Unsteadiness on feet (R26.81);Other abnormalities of gait and mobility (R26.89);Difficulty in walking, not elsewhere classified (R26.2)     Time: 8973-8953 PT Time Calculation (min) (ACUTE ONLY): 20 min  Charges:    $Gait Training: 8-22 mins PT General Charges $$ ACUTE PT VISIT: 1 Visit                     Elsie Grieves, PT, DPT [facility] Rehabilitation Department Office: [phone]   Elsie Grieves 01/29/2024, 10:49 AM

## 2024-01-29 NOTE — Discharge Summary (Signed)
 Physician Discharge Summary  TERREZ ANDER FMW:[id number] DOB: [birth year] DOA: 01/26/2024  PCP: Wilfrid Dedra Maus, PA-C  Admit date: 01/26/2024 Discharge date: 01/29/2024  Admitted From: Home Disposition:  Home  Discharge Condition:Stable CODE STATUS:DNR Diet recommendation: Heart Healthy  Brief/Interim Summary: Patient is 83 year old male with history of coronary artery disease, pulmonary hypertension, paroxysmal A-fib, PE on Eliquis , hypertension, hyperlipidemia, hypothyroidism who initially presented with med [facility] with complaint of shortness of breath, cough.  He was recently diagnosed with streptococcal infection, influenza last week.  Was placed on Z-Pak.  Continued to have shortness of breath at home.  On presentation to the ED, he was saturating 87% on room air.  Chest x-ray showed right upper lobe infiltrate consistent with pneumonia.  Patient admitted for the management of acute hypoxic respiratory secondary pneumonia/flu.  Has been weaned to room air.  Cultures have been negative.  Clinically much better now.  Medically stable for discharge home today   Following problems were addressed during the hospitalization:  Acute hypoxic respiratory failure secondary to community-acquired pneumonia: Presented with dyspnea, hypoxia.  Recently diagnosed with strep/flu infection.  Chest imaging showed right upper lobe pneumonia.  Likely postviral bacterial pneumonia.  Started on ceftriaxone , azithromycin .  Also on Tamiflu .   Currently on room air.  Antibiotics changed to oral on discharge.   Flu/strep infection: Diagnosed last week.  Flu was still positive.   HFrEF: Appears euvolemic.  Last echo 11/20/2020 showed EF of 40%.  Echo done here during this admission showed EF of 48%, global hypokinesis.  He needs to follow-up with his cardiologist as an outpatient.He follows at [facility]    Hypertension: Continue metoprolol .  Lisinopril on hold due to AKI.  Blood pressure stable on  metoprolol    AKI on CKD stage II: Looks like his baseline creatinine is around  1.3.  Currently kidney function at baseline.   Hypothyroidism: Continue Synthyroid   History of PE: CT negative for PE.  Continue Eliquis  for anticoagulation   Normocytic anemia: Current hemoglobin stable   Coronary artery disease: No anginal symptoms.  Continue to monitor   Hyponatremia: Mild.  Continue to monitor   Weakness: PT consulted.  Recommended home health  Discharge Diagnoses:  Principal Problem:   Acute hypoxic respiratory failure (HCC) Active Problems:   Hypothyroidism   Paroxysmal atrial fibrillation (HCC)   Hyponatremia   Pulmonary hypertension (HCC)   CAD (coronary artery disease)   AKI (acute kidney injury) (HCC)   Normocytic anemia   Influenza A with pneumonia    Discharge Instructions  Discharge Instructions     Diet - low sodium heart healthy   Complete by: As directed    Discharge instructions   Complete by: As directed    1)Please take prescribed medications as instructed 2)Follow up with your PCP in a week.   Increase activity slowly   Complete by: As directed       Allergies as of 01/29/2024       Reactions   Tamsulosin  Hcl Other (See Comments)   Pre-syncope   Flagyl [metronidazole] Nausea And Vomiting   States IV form causes to allergy   Lipitor [atorvastatin] Other (See Comments)   Myalgia   Tramadol Other (See Comments)   Dizziness        Medication List     STOP taking these medications    azithromycin  250 MG tablet Commonly known as: ZITHROMAX    lisinopril 20 MG tablet Commonly known as: ZESTRIL       TAKE  these medications    amoxicillin -clavulanate 875-125 MG tablet Commonly known as: AUGMENTIN  Take 1 tablet by mouth 2 (two) times daily for 1 day. Start taking on: January 30, 2024   apixaban  5 MG Tabs tablet Commonly known as: ELIQUIS  Take 5 mg by mouth 2 (two) times daily.   cyanocobalamin  1000 MCG/ML injection Commonly  known as: VITAMIN B12 Inject 1,000 mcg into the muscle every 30 (thirty) days.   Euthyrox  75 MCG tablet Generic drug: levothyroxine  TAKE 1 TABLET BY MOUTH ONCE DAILY IN THE MORNING ON AN EMPTY STOMACH. NEEDS TO MAKE AN APPOINTMENT What changed: See the new instructions.   guaiFENesin  600 MG 12 hr tablet Commonly known as: MUCINEX  Take 1 tablet (600 mg total) by mouth 2 (two) times daily for 5 days.   hydrocortisone 2.5 % cream Apply 1 Application topically daily as needed (ezcema).   ketoconazole 2 % shampoo Commonly known as: NIZORAL Apply 1 Application topically 2 (two) times a week.   meclizine  25 MG tablet Commonly known as: ANTIVERT  TAKE 1 TABLET BY MOUTH AS NEEDED What changed:  when to take this reasons to take this   metoprolol  succinate 50 MG 24 hr tablet Commonly known as: TOPROL -XL Take 50 mg by mouth daily.   omeprazole 40 MG capsule Commonly known as: PRILOSEC Take 40 mg by mouth daily.   oseltamivir  30 MG capsule Commonly known as: TAMIFLU  Take 1 capsule (30 mg total) by mouth 2 (two) times daily for 4 doses.   predniSONE  20 MG tablet Commonly known as: DELTASONE  Take 2 tablets (40 mg total) by mouth daily with breakfast for 3 days. Start taking on: January 30, 2024   simvastatin  40 MG tablet Commonly known as: ZOCOR  Take 1 tablet by mouth once daily What changed: when to take this   spironolactone  25 MG tablet Commonly known as: ALDACTONE  Take 12.5 mg by mouth every morning.        Follow-up Information     [facility], Dedra Maus, [place]. Schedule an appointment as soon as possible for a visit in 1 week(s).   Specialty: Physician Assistant Contact information: [address] [place] [address] [phone]                Allergies  Allergen Reactions   Tamsulosin  Hcl Other (See Comments)    Pre-syncope    Flagyl [Metronidazole] Nausea And Vomiting    States IV form causes to allergy   Lipitor [Atorvastatin] Other (See  Comments)    Myalgia   Tramadol Other (See Comments)    Dizziness    Consultations: None   Procedures/Studies: ECHOCARDIOGRAM COMPLETE Result Date: 01/27/2024    ECHOCARDIOGRAM REPORT   Patient Name:   DURANTE VIOLETT Date of Exam: 01/27/2024 Medical Rec #:  [id number]         Height:       70.0 in Accession #:    [id number]        Weight:       186.9 lb Date of Birth:  1941-06-26          BSA:          2.028 m Patient Age:    82 years          BP:           126/52 mmHg Patient Gender: M                 HR:           90  bpm. Exam Location:  Inpatient Procedure: 2D Echo, Cardiac Doppler and Color Doppler Indications:    CHF  History:        Patient has prior history of Echocardiogram examinations, most                 recent 03/12/2020. Cardiomyopathy and CHF, Carotid Disease,                 Mitral Valve Disease, Arrythmias:Atrial Fibrillation; Risk                 Factors:Former Smoker.  Sonographer:    Tillman Nora RVT RCS Referring Phys: [id number] MOHAMMAD L GARBA IMPRESSIONS  1. Left ventricular ejection fraction by 3D volume is 48 %. The left ventricle has mildly decreased function. The left ventricle demonstrates global hypokinesis. There is mild concentric left ventricular hypertrophy. Left ventricular diastolic parameters are indeterminate.  2. Right ventricular systolic function is normal. The right ventricular size is normal. Tricuspid regurgitation signal is inadequate for assessing PA pressure.  3. Left atrial size was mildly dilated.  4. The mitral valve is normal in structure. Trivial mitral valve regurgitation. No evidence of mitral stenosis.  5. The aortic valve is normal in structure. Aortic valve regurgitation is mild to moderate. Aortic valve sclerosis is present, with no evidence of aortic valve stenosis.  6. The inferior vena cava is normal in size with greater than 50% respiratory variability, suggesting right atrial pressure of 3 mmHg. FINDINGS  Left Ventricle: Left ventricular ejection  fraction by 3D volume is 48 %. The left ventricle has mildly decreased function. The left ventricle demonstrates global hypokinesis. The left ventricular internal cavity size was normal in size. There is mild concentric left ventricular hypertrophy. Left ventricular diastolic parameters are indeterminate. Right Ventricle: The right ventricular size is normal. No increase in right ventricular wall thickness. Right ventricular systolic function is normal. Tricuspid regurgitation signal is inadequate for assessing PA pressure. Left Atrium: Left atrial size was mildly dilated. Right Atrium: Right atrial size was normal in size. Pericardium: There is no evidence of pericardial effusion. Presence of epicardial fat layer. Mitral Valve: The mitral valve is normal in structure. Trivial mitral valve regurgitation. No evidence of mitral valve stenosis. Tricuspid Valve: The tricuspid valve is normal in structure. Tricuspid valve regurgitation is not demonstrated. No evidence of tricuspid stenosis. Aortic Valve: The aortic valve is normal in structure. Aortic valve regurgitation is mild to moderate. Aortic regurgitation PHT measures 480 msec. Aortic valve sclerosis is present, with no evidence of aortic valve stenosis. Aortic valve mean gradient measures 3.5 mmHg. Aortic valve peak gradient measures 5.6 mmHg. Aortic valve area, by VTI measures 2.18 cm. Pulmonic Valve: The pulmonic valve was normal in structure. Pulmonic valve regurgitation is not visualized. No evidence of pulmonic stenosis. Aorta: The aortic root is normal in size and structure. Venous: The inferior vena cava is normal in size with greater than 50% respiratory variability, suggesting right atrial pressure of 3 mmHg. IAS/Shunts: No atrial level shunt detected by color flow Doppler.  LEFT VENTRICLE PLAX 2D LVIDd:         5.20 cm         Diastology LVIDs:         4.60 cm         LV e' medial:    6.45 cm/s LV PW:         1.07 cm         LV E/e' medial:  13.8 LV  IVS:  1.13 cm         LV e' lateral:   8.70 cm/s LVOT diam:     2.10 cm         LV E/e' lateral: 10.2 LV SV:         59 LV SV Index:   29 LVOT Area:     3.46 cm        3D Volume EF                                LV 3D EF:    Left                                             ventricul                                             ar                                             ejection                                             fraction                                             by 3D                                             volume is                                             48 %.                                 3D Volume EF:                                3D EF:        48 %                                LV EDV:       195 ml                                LV ESV:       102 ml  LV SV:        94 ml RIGHT VENTRICLE             IVC RV Basal diam:  3.60 cm     IVC diam: 2.50 cm RV S prime:     14.50 cm/s TAPSE (M-mode): 3.3 cm LEFT ATRIUM             Index        RIGHT ATRIUM           Index LA Vol (A2C):   65.8 ml 32.44 ml/m  RA Area:     10.90 cm LA Vol (A4C):   85.0 ml 41.91 ml/m  RA Volume:   22.80 ml  11.24 ml/m LA Biplane Vol: 75.3 ml 37.12 ml/m  AORTIC VALVE                    PULMONIC VALVE AV Area (Vmax):    2.86 cm     PV Vmax:       0.99 m/s AV Area (Vmean):   2.70 cm     PV Peak grad:  3.9 mmHg AV Area (VTI):     2.18 cm AV Vmax:           118.50 cm/s AV Vmean:          83.350 cm/s AV VTI:            0.270 m AV Peak Grad:      5.6 mmHg AV Mean Grad:      3.5 mmHg LVOT Vmax:         97.80 cm/s LVOT Vmean:        64.900 cm/s LVOT VTI:          0.169 m LVOT/AV VTI ratio: 0.63 AI PHT:            480 msec AR Vena Contracta: 1.10 cm  AORTA Ao Root diam: 3.40 cm MITRAL VALVE MV Area (PHT): 3.54 cm     SHUNTS MV Decel Time: 214 msec     Systemic VTI:  0.17 m MV E velocity: 88.80 cm/s   Systemic Diam: 2.10 cm MV A velocity: 102.00 cm/s MV E/A ratio:  0.87 Kardie Tobb  DO Electronically signed by Dub Huntsman DO Signature Date/Time: 01/27/2024/11:20:13 AM    Final    CT Angio Chest PE W and/or Wo Contrast Result Date: 01/26/2024 CLINICAL DATA:  Pulmonary embolism suspected, excessive coughing, runny nose, weakness, decreased appetite. Diagnosed with strep and flu. Not getting better. EXAM: CT ANGIOGRAPHY CHEST WITH CONTRAST TECHNIQUE: Multidetector CT imaging of the chest was performed using the standard protocol during bolus administration of intravenous contrast. Multiplanar CT image reconstructions and MIPs were obtained to evaluate the vascular anatomy. RADIATION DOSE REDUCTION: This exam was performed according to the departmental dose-optimization program which includes automated exposure control, adjustment of the mA and/or kV according to patient size and/or use of iterative reconstruction technique. CONTRAST:  80mL OMNIPAQUE  IOHEXOL  350 MG/ML SOLN COMPARISON:  Same day chest radiograph and CT 03/03/2017 FINDINGS: Cardiovascular: Negative for acute pulmonary embolism. No pericardial effusion. Coronary artery and aortic atherosclerotic calcification. Ascending aorta at the upper limits of normal caliber measuring 39 mm. Mediastinum/Nodes: Trachea and esophagus are unremarkable. No thoracic adenopathy. Lungs/Pleura: Mild bronchial wall thickening. Scarring/atelectasis in the left lower lobe. Increased focus of consolidation in the anteromedial right upper lobe (series 303/image 83) compared with 03/03/2017. This measures 1.6 x 0.8 cm and is slightly increased in size compared to  2018. This is favored to represent progressive atelectasis or scarring however underlying mass or pneumonia are not excluded. No pleural effusion or pneumothorax. Upper Abdomen: No acute abnormality. Musculoskeletal: No acute fracture. Review of the MIP images confirms the above findings. IMPRESSION: 1. Negative for acute pulmonary embolism. 2. Increased focus of consolidation in the anteromedial  right upper lobe compared with 03/03/2017. This is favored to represent progressive atelectasis or scarring however underlying mass or pneumonia are not excluded. Recommend follow-up in CT in 3-6 months. 3.  Aortic Atherosclerosis (ICD10-I70.0). Electronically Signed   By: Norman Gatlin M.D.   On: 01/26/2024 14:25   DG Chest Portable 1 View Result Date: 01/26/2024 CLINICAL DATA:  cough.  Weakness. EXAM: PORTABLE CHEST 1 VIEW COMPARISON:  06/29/2023. FINDINGS: Bilateral lung fields are clear. Bilateral costophrenic angles are clear. Normal cardio-mediastinal silhouette. No acute osseous abnormalities. The soft tissues are within normal limits. IMPRESSION: No active disease. Electronically Signed   By: Ree Molt M.D.   On: 01/26/2024 12:08      Subjective: Patient seen and examined at bedside today.  Hemodynamically stable.  On room air.  Feels much better today.  No worsening of shortness of breath or cough.  Medically stable for discharge home today.  I discussed about discharge planning with his wife on phone  Discharge Exam: Vitals:   01/29/24 0526 01/29/24 0742  BP: (!) 129/57   Pulse: 69   Resp: 20   Temp: 98.4 F (36.9 C)   SpO2: 94% 94%   Vitals:   01/28/24 2031 01/28/24 2204 01/29/24 0526 01/29/24 0742  BP:  127/73 (!) 129/57   Pulse:  83 69   Resp:  20 20   Temp:  98.8 F (37.1 C) 98.4 F (36.9 C)   TempSrc:  Oral Oral   SpO2: 95% 92% 94% 94%  Weight:      Height:        General: Pt is alert, awake, not in acute distress Cardiovascular: RRR, S1/S2 +, no rubs, no gallops Respiratory: CTA bilaterally, no wheezing, no rhonchi Abdominal: Soft, NT, ND, bowel sounds + Extremities: no edema, no cyanosis    The results of significant diagnostics from this hospitalization (including imaging, microbiology, ancillary and laboratory) are listed below for reference.     Microbiology: Recent Results (from the past 240 hours)  Resp panel by RT-PCR (RSV, Flu A&B, Covid)  Anterior Nasal Swab     Status: Abnormal   Collection Time: 01/26/24  9:12 AM   Specimen: Anterior Nasal Swab  Result Value Ref Range Status   SARS Coronavirus 2 by RT PCR NEGATIVE NEGATIVE Final    Comment: (NOTE) SARS-CoV-2 target nucleic acids are NOT DETECTED.  The SARS-CoV-2 RNA is generally detectable in upper respiratory specimens during the acute phase of infection. The lowest concentration of SARS-CoV-2 viral copies this assay can detect is 138 copies/mL. A negative result does not preclude SARS-Cov-2 infection and should not be used as the sole basis for treatment or other patient management decisions. A negative result may occur with  improper specimen collection/handling, submission of specimen other than nasopharyngeal swab, presence of viral mutation(s) within the areas targeted by this assay, and inadequate number of viral copies(<138 copies/mL). A negative result must be combined with clinical observations, patient history, and epidemiological information. The expected result is Negative.  Fact Sheet for Patients:  bloggercourse.com  Fact Sheet for Healthcare Providers:  seriousbroker.it  This test is no t yet approved or cleared by the [place]   FDA and  has been authorized for detection and/or diagnosis of SARS-CoV-2 by FDA under an Emergency Use Authorization (EUA). This EUA will remain  in effect (meaning this test can be used) for the duration of the COVID-19 declaration under Section 564(b)(1) of the Act, 21 U.S.C.section 360bbb-3(b)(1), unless the authorization is terminated  or revoked sooner.       Influenza A by PCR POSITIVE (A) NEGATIVE Final   Influenza B by PCR NEGATIVE NEGATIVE Final    Comment: (NOTE) The Xpert Xpress SARS-CoV-2/FLU/RSV plus assay is intended as an aid in the diagnosis of influenza from Nasopharyngeal swab specimens and should not be used as a sole basis for treatment. Nasal  washings and aspirates are unacceptable for Xpert Xpress SARS-CoV-2/FLU/RSV testing.  Fact Sheet for Patients: bloggercourse.com  Fact Sheet for Healthcare Providers: seriousbroker.it  This test is not yet approved or cleared by the [place]  FDA and has been authorized for detection and/or diagnosis of SARS-CoV-2 by FDA under an Emergency Use Authorization (EUA). This EUA will remain in effect (meaning this test can be used) for the duration of the COVID-19 declaration under Section 564(b)(1) of the Act, 21 U.S.C. section 360bbb-3(b)(1), unless the authorization is terminated or revoked.     Resp Syncytial Virus by PCR NEGATIVE NEGATIVE Final    Comment: (NOTE) Fact Sheet for Patients: bloggercourse.com  Fact Sheet for Healthcare Providers: seriousbroker.it  This test is not yet approved or cleared by the [place]  FDA and has been authorized for detection and/or diagnosis of SARS-CoV-2 by FDA under an Emergency Use Authorization (EUA). This EUA will remain in effect (meaning this test can be used) for the duration of the COVID-19 declaration under Section 564(b)(1) of the Act, 21 U.S.C. section 360bbb-3(b)(1), unless the authorization is terminated or revoked.  Performed at [facility], [address] [place], [place] [address]   Blood Culture (routine x 2)     Status: None (Preliminary result)   Collection Time: 01/26/24 10:05 AM   Specimen: BLOOD  Result Value Ref Range Status   Specimen Description   Final    BLOOD RIGHT ANTECUBITAL Performed at [facility], [address] [place], [place] [address]    Special Requests   Final    BOTTLES DRAWN AEROBIC AND ANAEROBIC Blood Culture results may not be optimal due to an inadequate volume of blood received in culture bottles Performed at [facility], [address] [place], [place] [address]    Culture   Final    NO GROWTH 3 DAYS Performed at [facility], [address] [place], [place] [address]    Report Status PENDING  Incomplete  Blood Culture (routine x 2)     Status: None (Preliminary result)   Collection Time: 01/26/24 10:10 AM   Specimen: BLOOD RIGHT FOREARM  Result Value Ref Range Status   Specimen Description   Final    BLOOD RIGHT FOREARM Performed at [facility], [address] [place], [place] [address]    Special Requests   Final    BOTTLES DRAWN AEROBIC ONLY Blood Culture results may not be optimal due to an inadequate volume of blood received in culture bottles Performed at [facility], [address] [place], [place] [address]    Culture   Final    NO GROWTH 3 DAYS Performed at [facility], [address] [place], [place] [address]  Report Status PENDING  Incomplete     Labs: BNP (last 3 results) Recent Labs    06/29/23 1721 01/26/24 1016  BNP 14.7 70.6   Basic Metabolic Panel: Recent Labs  Lab 01/26/24 1016 01/27/24 0810 01/28/24 0614 01/29/24 0809  NA 132* 136 140 142  K 4.7 5.0 4.6 4.2  CL 103 106 111 113*  CO2 20* 19* 21* 17*  GLUCOSE 110* 141* 102* 111*  BUN 35* 38* 38* 31*  CREATININE 1.45* 1.34* 1.25* 1.21  CALCIUM 8.4* 8.6* 8.4* 8.3*  MG  --   --  2.2  --    Liver Function Tests: Recent Labs  Lab 01/26/24 1016 01/27/24 0810  AST 54* 43*  ALT 44 41  ALKPHOS 33* 31*  BILITOT 1.0 0.7  PROT 6.2* 5.7*  ALBUMIN 3.3* 3.0*   No results for input(s): LIPASE, AMYLASE in the last 168 hours. No results for input(s): AMMONIA in the last 168 hours. CBC: Recent Labs  Lab 01/26/24 1016 01/27/24 0708 01/28/24 0614 01/29/24 0809  WBC 4.3 7.8 12.5* 9.1  NEUTROABS 2.5  --   --   --   HGB 10.7* 9.8* 9.9* 9.7*  HCT 31.9* 30.4* 31.2* 30.4*  MCV 87.2 89.9 91.0 89.7  PLT 139* 153 176 194   Cardiac Enzymes: No results for input(s): CKTOTAL, CKMB, CKMBINDEX,  TROPONINI in the last 168 hours. BNP: Invalid input(s): POCBNP CBG: No results for input(s): GLUCAP in the last 168 hours. D-Dimer No results for input(s): DDIMER in the last 72 hours. Hgb A1c No results for input(s): HGBA1C in the last 72 hours. Lipid Profile No results for input(s): CHOL, HDL, LDLCALC, TRIG, CHOLHDL, LDLDIRECT in the last 72 hours. Thyroid  function studies No results for input(s): TSH, T4TOTAL, T3FREE, THYROIDAB in the last 72 hours.  Invalid input(s): FREET3 Anemia work up No results for input(s): VITAMINB12, FOLATE, FERRITIN, TIBC, IRON, RETICCTPCT in the last 72 hours. Urinalysis    Component Value Date/Time   COLORURINE YELLOW 01/26/2024 1016   APPEARANCEUR CLEAR 01/26/2024 1016   LABSPEC <=1.005 01/26/2024 1016   PHURINE 5.0 01/26/2024 1016   GLUCOSEU NEGATIVE 01/26/2024 1016   GLUCOSEU NEGATIVE 11/07/2018 1123   HGBUR NEGATIVE 01/26/2024 1016   BILIRUBINUR NEGATIVE 01/26/2024 1016   BILIRUBINUR Negative 10/07/2018 1027   KETONESUR NEGATIVE 01/26/2024 1016   PROTEINUR NEGATIVE 01/26/2024 1016   UROBILINOGEN 0.2 11/07/2018 1123   NITRITE NEGATIVE 01/26/2024 1016   LEUKOCYTESUR NEGATIVE 01/26/2024 1016   Sepsis Labs Recent Labs  Lab 01/26/24 1016 01/27/24 0708 01/28/24 0614 01/29/24 0809  WBC 4.3 7.8 12.5* 9.1   Microbiology Recent Results (from the past 240 hours)  Resp panel by RT-PCR (RSV, Flu A&B, Covid) Anterior Nasal Swab     Status: Abnormal   Collection Time: 01/26/24  9:12 AM   Specimen: Anterior Nasal Swab  Result Value Ref Range Status   SARS Coronavirus 2 by RT PCR NEGATIVE NEGATIVE Final    Comment: (NOTE) SARS-CoV-2 target nucleic acids are NOT DETECTED.  The SARS-CoV-2 RNA is generally detectable in upper respiratory specimens during the acute phase of infection. The lowest concentration of SARS-CoV-2 viral copies this assay can detect is 138 copies/mL. A negative result does not  preclude SARS-Cov-2 infection and should not be used as the sole basis for treatment or other patient management decisions. A negative result may occur with  improper specimen collection/handling, submission of specimen other than nasopharyngeal swab, presence of viral mutation(s) within the areas targeted by this assay, and inadequate  number of viral copies(<138 copies/mL). A negative result must be combined with clinical observations, patient history, and epidemiological information. The expected result is Negative.  Fact Sheet for Patients:  bloggercourse.com  Fact Sheet for Healthcare Providers:  seriousbroker.it  This test is no t yet approved or cleared by the [place]  FDA and  has been authorized for detection and/or diagnosis of SARS-CoV-2 by FDA under an Emergency Use Authorization (EUA). This EUA will remain  in effect (meaning this test can be used) for the duration of the COVID-19 declaration under Section 564(b)(1) of the Act, 21 U.S.C.section 360bbb-3(b)(1), unless the authorization is terminated  or revoked sooner.       Influenza A by PCR POSITIVE (A) NEGATIVE Final   Influenza B by PCR NEGATIVE NEGATIVE Final    Comment: (NOTE) The Xpert Xpress SARS-CoV-2/FLU/RSV plus assay is intended as an aid in the diagnosis of influenza from Nasopharyngeal swab specimens and should not be used as a sole basis for treatment. Nasal washings and aspirates are unacceptable for Xpert Xpress SARS-CoV-2/FLU/RSV testing.  Fact Sheet for Patients: bloggercourse.com  Fact Sheet for Healthcare Providers: seriousbroker.it  This test is not yet approved or cleared by the [place]  FDA and has been authorized for detection and/or diagnosis of SARS-CoV-2 by FDA under an Emergency Use Authorization (EUA). This EUA will remain in effect (meaning this test can be used) for the  duration of the COVID-19 declaration under Section 564(b)(1) of the Act, 21 U.S.C. section 360bbb-3(b)(1), unless the authorization is terminated or revoked.     Resp Syncytial Virus by PCR NEGATIVE NEGATIVE Final    Comment: (NOTE) Fact Sheet for Patients: bloggercourse.com  Fact Sheet for Healthcare Providers: seriousbroker.it  This test is not yet approved or cleared by the [place]  FDA and has been authorized for detection and/or diagnosis of SARS-CoV-2 by FDA under an Emergency Use Authorization (EUA). This EUA will remain in effect (meaning this test can be used) for the duration of the COVID-19 declaration under Section 564(b)(1) of the Act, 21 U.S.C. section 360bbb-3(b)(1), unless the authorization is terminated or revoked.  Performed at [facility], [address] [place], [place] [address]   Blood Culture (routine x 2)     Status: None (Preliminary result)   Collection Time: 01/26/24 10:05 AM   Specimen: BLOOD  Result Value Ref Range Status   Specimen Description   Final    BLOOD RIGHT ANTECUBITAL Performed at [facility], [address] [place], [place] [address]    Special Requests   Final    BOTTLES DRAWN AEROBIC AND ANAEROBIC Blood Culture results may not be optimal due to an inadequate volume of blood received in culture bottles Performed at [facility], [address] [place], [place] [address]    Culture   Final    NO GROWTH 3 DAYS Performed at [facility], [address] [place], [place] [address]    Report Status PENDING  Incomplete  Blood Culture (routine x 2)     Status: None (Preliminary result)   Collection Time: 01/26/24 10:10 AM   Specimen: BLOOD RIGHT FOREARM  Result Value Ref Range Status   Specimen Description   Final    BLOOD RIGHT FOREARM Performed at [facility], [address] [place], [place] [address]    Special Requests    Final    BOTTLES DRAWN AEROBIC ONLY Blood Culture results may not be optimal due  to an inadequate volume of blood received in culture bottles Performed at [facility], [address] [place], [place] [address]    Culture   Final    NO GROWTH 3 DAYS Performed at [facility], [address] [place], [place] [address]    Report Status PENDING  Incomplete    Please note: You were cared for by a hospitalist during your hospital stay. Once you are discharged, your primary care physician will handle any further medical issues. Please note that NO REFILLS for any discharge medications will be authorized once you are discharged, as it is imperative that you return to your primary care physician (or establish a relationship with a primary care physician if you do not have one) for your post hospital discharge needs so that they can reassess your need for medications and monitor your lab values.    Time coordinating discharge: 40 minutes  SIGNED:   Ivonne Mustache, MD  [facility] 01/29/2024, 9:58 AM Pager [phone]  If 7PM-7AM, please contact night-coverage [email or web address] Password [email or web address]

## 2024-02-01 LAB — CULTURE, BLOOD (ROUTINE X 2)
Culture: NO GROWTH
Culture: NO GROWTH
